# Patient Record
Sex: Male | Born: 1945 | Race: White | Hispanic: No | Marital: Married | Smoking: Never smoker
Health system: Southern US, Community
[De-identification: ages and names within clinical notes are randomized; demographics above are authoritative.]

## PROBLEM LIST (undated history)

## (undated) DIAGNOSIS — I2510 Atherosclerotic heart disease of native coronary artery without angina pectoris: Secondary | ICD-10-CM

## (undated) DIAGNOSIS — M1990 Unspecified osteoarthritis, unspecified site: Secondary | ICD-10-CM

## (undated) DIAGNOSIS — G4730 Sleep apnea, unspecified: Secondary | ICD-10-CM

## (undated) DIAGNOSIS — R011 Cardiac murmur, unspecified: Secondary | ICD-10-CM

## (undated) DIAGNOSIS — D329 Benign neoplasm of meninges, unspecified: Secondary | ICD-10-CM

## (undated) DIAGNOSIS — D6851 Activated protein C resistance: Secondary | ICD-10-CM

## (undated) DIAGNOSIS — N400 Enlarged prostate without lower urinary tract symptoms: Secondary | ICD-10-CM

## (undated) DIAGNOSIS — G629 Polyneuropathy, unspecified: Secondary | ICD-10-CM

## (undated) DIAGNOSIS — E119 Type 2 diabetes mellitus without complications: Secondary | ICD-10-CM

## (undated) DIAGNOSIS — I639 Cerebral infarction, unspecified: Secondary | ICD-10-CM

## (undated) DIAGNOSIS — I82409 Acute embolism and thrombosis of unspecified deep veins of unspecified lower extremity: Secondary | ICD-10-CM

## (undated) DIAGNOSIS — M1611 Unilateral primary osteoarthritis, right hip: Secondary | ICD-10-CM

## (undated) DIAGNOSIS — E785 Hyperlipidemia, unspecified: Secondary | ICD-10-CM

## (undated) DIAGNOSIS — I10 Essential (primary) hypertension: Secondary | ICD-10-CM

## (undated) DIAGNOSIS — R9431 Abnormal electrocardiogram [ECG] [EKG]: Secondary | ICD-10-CM

## (undated) DIAGNOSIS — Z7901 Long term (current) use of anticoagulants: Secondary | ICD-10-CM

## (undated) DIAGNOSIS — E669 Obesity, unspecified: Secondary | ICD-10-CM

## (undated) DIAGNOSIS — K760 Fatty (change of) liver, not elsewhere classified: Secondary | ICD-10-CM

## (undated) HISTORY — PX: TONSILLECTOMY: SUR1361

## (undated) HISTORY — DX: Acute embolism and thrombosis of unspecified deep veins of unspecified lower extremity: I82.409

## (undated) HISTORY — DX: Hyperlipidemia, unspecified: E78.5

## (undated) HISTORY — DX: Abnormal electrocardiogram (ECG) (EKG): R94.31

## (undated) HISTORY — DX: Unspecified osteoarthritis, unspecified site: M19.90

## (undated) HISTORY — DX: Activated protein C resistance: D68.51

## (undated) HISTORY — DX: Benign neoplasm of meninges, unspecified: D32.9

## (undated) HISTORY — DX: Long term (current) use of anticoagulants: Z79.01

## (undated) HISTORY — PX: CORONARY STENT PLACEMENT: SHX1402

## (undated) HISTORY — DX: Type 2 diabetes mellitus without complications: E11.9

## (undated) HISTORY — DX: Obesity, unspecified: E66.9

## (undated) HISTORY — DX: Fatty (change of) liver, not elsewhere classified: K76.0

---
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
## 2001-10-21 ENCOUNTER — Ambulatory Visit (HOSPITAL_COMMUNITY): Admission: RE | Admit: 2001-10-21 | Discharge: 2001-10-21 | Payer: Self-pay | Admitting: Family Medicine

## 2002-09-21 ENCOUNTER — Emergency Department (HOSPITAL_COMMUNITY): Admission: EM | Admit: 2002-09-21 | Discharge: 2002-09-21 | Payer: Self-pay | Admitting: Emergency Medicine

## 2006-09-14 ENCOUNTER — Emergency Department (HOSPITAL_COMMUNITY): Admission: EM | Admit: 2006-09-14 | Discharge: 2006-09-15 | Payer: Self-pay | Admitting: Emergency Medicine

## 2006-09-16 ENCOUNTER — Emergency Department (HOSPITAL_COMMUNITY): Admission: EM | Admit: 2006-09-16 | Discharge: 2006-09-16 | Payer: Self-pay | Admitting: Emergency Medicine

## 2006-09-28 ENCOUNTER — Encounter: Admission: RE | Admit: 2006-09-28 | Discharge: 2006-09-28 | Payer: Self-pay | Admitting: Pulmonary Disease

## 2006-10-02 DIAGNOSIS — G4730 Sleep apnea, unspecified: Secondary | ICD-10-CM

## 2006-10-02 HISTORY — DX: Sleep apnea, unspecified: G47.30

## 2006-11-23 ENCOUNTER — Ambulatory Visit (HOSPITAL_COMMUNITY): Admission: RE | Admit: 2006-11-23 | Discharge: 2006-11-23 | Payer: Self-pay | Admitting: Neurology

## 2007-07-19 ENCOUNTER — Ambulatory Visit (HOSPITAL_COMMUNITY): Admission: RE | Admit: 2007-07-19 | Discharge: 2007-07-19 | Payer: Self-pay | Admitting: Pulmonary Disease

## 2007-08-01 ENCOUNTER — Ambulatory Visit (HOSPITAL_COMMUNITY): Admission: RE | Admit: 2007-08-01 | Discharge: 2007-08-01 | Payer: Self-pay | Admitting: Pulmonary Disease

## 2007-08-01 ENCOUNTER — Ambulatory Visit: Payer: Self-pay | Admitting: Cardiology

## 2007-10-03 HISTORY — PX: DUPUYTREN / PALMAR FASCIOTOMY: SUR601

## 2007-10-28 ENCOUNTER — Ambulatory Visit (HOSPITAL_BASED_OUTPATIENT_CLINIC_OR_DEPARTMENT_OTHER): Admission: RE | Admit: 2007-10-28 | Discharge: 2007-10-28 | Payer: Self-pay | Admitting: Orthopedic Surgery

## 2007-10-28 ENCOUNTER — Encounter (INDEPENDENT_AMBULATORY_CARE_PROVIDER_SITE_OTHER): Payer: Self-pay | Admitting: Orthopedic Surgery

## 2008-05-02 HISTORY — PX: COLONOSCOPY W/ POLYPECTOMY: SHX1380

## 2008-05-07 ENCOUNTER — Ambulatory Visit: Payer: Self-pay | Admitting: Gastroenterology

## 2008-05-18 ENCOUNTER — Ambulatory Visit: Payer: Self-pay | Admitting: Gastroenterology

## 2008-05-18 ENCOUNTER — Ambulatory Visit (HOSPITAL_COMMUNITY): Admission: RE | Admit: 2008-05-18 | Discharge: 2008-05-18 | Payer: Self-pay | Admitting: Gastroenterology

## 2008-05-18 ENCOUNTER — Encounter: Payer: Self-pay | Admitting: Gastroenterology

## 2008-10-02 HISTORY — PX: VENTRAL HERNIA REPAIR: SHX424

## 2009-08-02 ENCOUNTER — Emergency Department (HOSPITAL_COMMUNITY): Admission: EM | Admit: 2009-08-02 | Discharge: 2009-08-02 | Payer: Self-pay | Admitting: Emergency Medicine

## 2009-08-04 ENCOUNTER — Ambulatory Visit (HOSPITAL_COMMUNITY): Admission: RE | Admit: 2009-08-04 | Discharge: 2009-08-04 | Payer: Self-pay | Admitting: General Surgery

## 2010-10-23 ENCOUNTER — Encounter: Payer: Self-pay | Admitting: Pulmonary Disease

## 2011-01-04 LAB — COMPREHENSIVE METABOLIC PANEL
Alkaline Phosphatase: 71 U/L (ref 39–117)
BUN: 9 mg/dL (ref 6–23)
CO2: 32 mEq/L (ref 19–32)
Calcium: 9.1 mg/dL (ref 8.4–10.5)
Creatinine, Ser: 0.7 mg/dL (ref 0.4–1.5)
GFR calc Af Amer: >60 mL/min (ref >60)
Glucose, Bld: 178 mg/dL — ABNORMAL HIGH (ref 70–99)
Potassium: 3.5 mEq/L (ref 3.5–5.1)
Total Bilirubin: 0.6 mg/dL (ref 0.3–1.2)

## 2011-01-04 LAB — CBC
MCHC: 34 g/dL (ref 30.0–36.0)
MCV: 87.3 fL (ref 78.0–100.0)
Platelets: 259 10*3/uL (ref 150–400)
RDW: 14.7 % (ref 11.5–15.5)
WBC: 13.4 10*3/uL — ABNORMAL HIGH (ref 4.0–10.5)

## 2011-01-04 LAB — URINALYSIS, ROUTINE W REFLEX MICROSCOPIC
Protein, ur: NEGATIVE mg/dL
pH: 6 (ref 5.0–8.0)

## 2011-01-04 LAB — DIFFERENTIAL
Lymphocytes Relative: 8 % — ABNORMAL LOW (ref 12–46)
Lymphs Abs: 1.1 10*3/uL (ref 0.7–4.0)
Monocytes Absolute: 0.6 10*3/uL (ref 0.1–1.0)
Monocytes Relative: 5 % (ref 3–12)
Neutro Abs: 11.6 10*3/uL — ABNORMAL HIGH (ref 1.7–7.7)

## 2011-02-14 NOTE — Op Note (Signed)
NAMECACE, OSORTO               ACCOUNT NO.:  [id number]   MEDICAL RECORD NO.:  [id number]          PATIENT TYPE:  AMB   LOCATION:  DSC                          FACILITY:  [facility]   PHYSICIAN:  Madelynn Done, MD  DATE OF BIRTH:  [birth year]   DATE OF PROCEDURE:  10/28/2007  DATE OF DISCHARGE:                               OPERATIVE REPORT   PREOPERATIVE DIAGNOSIS:  Right hand Dupuytren's contracture, ring and  small finger, digit and palm.   POSTOPERATIVE DIAGNOSIS:  Right hand Dupuytren's contracture, ring and  small finger, digit and palm.   ATTENDING SURGEON:  Dr. Bradly Bienenstock, who was scrubbed and present for  the entire procedure.   ASSISTANT SURGEON:  None.   PROCEDURE:  1. Right ring finger digital and palmar fasciectomy with advancement      flap closure.  2. Right small finger palmer and digital fasciectomy with contracture      involving the proximal interphalangeal joint with Y-V advancement      flap closure.   ANESTHESIA:  Infraclavicular block with performed by Anesthesia as well  as general via LMA.   TOURNIQUET TIME:  60 minutes at 200 mmHg.   SPECIMENS:  Dupuytren's contracture and tissue to pathology of both the  ring and small finger.   SURGICAL INDICATIONS:  Mr. Rowland is a 65 year old right-hand-dominant  gentleman who presented to the office with a PIP contracture to the  right small finger as well as MP contracture to the small finger as well  as a large pre tenderness and retrovascular cord.  He also had a MP  contracture of the right ring finger.  After counseling was administered  in the office, the patient elected to undergo the above procedure.  Risks, benefits and alternatives discussed in detail.  Risks include but  not limited to bleeding, infection, hematoma, neurovascular bundle  injury, stiffness in the fingers, recurrence of the disease, loss of  motion of the digits and need for further surgical intervention and  dystrophy of the  hand.  A signed informed consent was obtained on the  day of surgery.   DESCRIPTION OF PROCEDURE:  The patient was properly identified in the  preoperative holding area.  The patient underwent infraclavicular block  without any difficulties.  The patient received preoperative antibiotics  prior to any skin incision.  The patient brought back to the operating  room, placed supine on anesthesia table.  General anesthesia was  administered via LMA.  The patient tolerated this procedure well.  A  well-padded tourniquet was then placed on the right brachium and sealed  with a 1000 drape.  The right upper extremity were prepped with  Hibiclens and sterilely draped.  Time-out was called, correct side was  identified and procedure was then begun.  Attention was then turned to  the right ring finger where short V-Y skin incisions were then made over  the ring finger, extending into the digit and extending into the palm.  After Esmarch exsanguination, dissection was carried down through the  skin and subcutaneous tissues.  The neurovascular bundles were  identified  proximally both radially and ulnarly.  The diseased fascia  was then excised sharply all the way down to the cord extending into  behind the flexor tendon sheath.  After excision of the disease tissue  the wounds were then thoroughly irrigated.  The skin flaps were then  loosely reapproximated with a 5-0 nylon suture.  Attention was then  turned the ring finger where using similar skin incisions Y-V skin  incisions, dissection was carried down through the skin of the digit and  into the palm through the skin and subcutaneous tissues.  Careful skin  flaps were then raised.  The dissection was then done proximally where  the diseased tissue was identified and then after proper identification  of the neurovascular bundles, the disease tissue was then transected  transversely.  This allowed the digit to be brought into better more   extension.  Following this a traction was then applied to the diseased  tissue.  The cord to the abductor digiti minimi as well as a  retrovascular cord were then resected.  The neurovascular bundle had  been displaced volarly through the spiral cord and the spiral cord was  then protected ulnarly and the diseased tissue was then resected after  identification and protection of the neurovascular bundles.  Once the  diseased tissue was then removed, the PIP joint was able to be placed  into full extension.  The wound was then thoroughly irrigated.  The  tourniquet deflated.  Hemostasis obtained with bipolar cautery.  The  skin flaps were then advanced in a Y-V fashion.  The skin was then  closed with the 5-0 nylon suture and 4-0 nylon sutures.  Small vessel  loop was applied a drain for the small finger.  Adaptic dressing,  sterile compressive dressing was then applied to the digit.  The patient  was then taken to the recovery room in good condition.  Following  deflation of tourniquet there was good perfusion of the fingers.   POSTOPERATIVE PLAN:  The patient be seen back in the office in one day  for wound check and drain removal.  He will be then set up for therapy  for a hand splint keeping his digits in full extension and proceed with  a postoperative Dupuytren's protocol.      Madelynn Done, MD  Electronically Signed     FWO/MEDQ  D:  10/28/2007  T:  10/28/2007  Job:  [id number]

## 2011-02-14 NOTE — Consult Note (Signed)
NAME:  Omar [name], Omar [name]               ACCOUNT NO.:  [id number]   MEDICAL RECORD NO.:  [id number]          PATIENT TYPE:  AMB   LOCATION:  DAY                           FACILITY:  [facility]   PHYSICIAN:  Kassie Mends, M.D.      DATE OF BIRTH:  Sep 05, 1946   DATE OF CONSULTATION:  DATE OF DISCHARGE:                                 CONSULTATION   REFERRING PHYSICIAN:  Dr.Ed Juanetta Gosling.   REASON FOR CONSULTATION:  Colonoscopy.   HISTORY OF PRESENT ILLNESS:  [name] is a 65 year old male who has  significant past medical history of congestive heart failure and  diabetes.  He is recently had an adjustment in his diuretic regimen due  to retained fluid.  He came to discuss of the benefits versus the risks  of colonoscopy.  He denies any rectal bleeding, black tarry stools,  change in bowel habits, heartburn, indigestion weight loss, abdominal  pain, problems of swallowing, or problems with sedation.  He has had no  nausea, vomiting, diarrhea, or constipation.  His appetite has been  good.  He has no problems of swallowing.   PAST MEDICAL HISTORY:  1. Dupuytren contracture.  2. Diabetes.  3. Benign prostatic hypertrophy.  4. Congestive heart failure.   He has never had any history of diverticulitis.   PAST SURGICAL HISTORY:  Right hand surgery.   ALLERGIES:  No known drug allergies.   MEDICATIONS:  1. Actos 30 mg daily.  2. Simvastatin 40 mg daily.  3. Flomax 0.4 mg daily.  4. Carvedilol 6.25 mg daily.  5. Amlodipine 5 mg daily.  6. Lisinopril and hydrochlorothiazide 20/25 daily.  7. Sertraline 50 mg daily.  8. Glyburide and metformin 5/500 two b.i.d.  9. Torsemide 100 mg half daily.   FAMILY HISTORY:  He has no family history of colon cancer or colon  polyps.   SOCIAL HISTORY:  He is married and he works for [employer].  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  He denies any tobacco or alcohol use.   PHYSICAL EXAM:  VITAL SIGNS:  Weight 290 pounds (down 9 pounds since  July  2009), height 5 feet 8 inches, BMI 44.1 (morbidly obese),  temperature 98, blood pressure 130/80, and pulse 64.  GENERAL:  He is in no apparent distress.  Alert and oriented x4.  HEENT:  Atraumatic and normocephalic.  Pupils equal, round, and reactive  to light.  Mouth no oral lesions.  Posterior pharynx without erythema or  exudate.  NECK:  Full range of motion.  No lymphadenopathy.  LUNGS:  Clear to auscultation bilaterally.  CARDIOVASCULAR:  Regular rhythm.  No murmur, normal S1 and S2.  ABDOMEN:  Bowel sounds are present, soft, nontender, and nondistended.  No rebound or guarding.  EXTREMITIES:  Trace edema in the left lower extremity, 1+ edema in the  right lower extremity.  NEUROLOGIC:  He has no focal neurologic deficits.   ASSESSMENT:  [name] is a 65 year old male who is average risk for  developing colon cancer.  His bowel prep needs to be adjusted for his  diabetes.  Thank you for allowing me to see Mr. Cuthrell in consultation.  My recommendations follow.   RECOMMENDATIONS:  1. He is to hold his Actos on the day before and the day of this      procedure.  He should hold his glyburide/metformin on the day of      his procedure.  2. He will receive the TriLyte bowel prep.  I did give him      instructions on how to take the bowel prep.  I did discuss with him      that it may cause nausea, vomiting, abdominal cramping, and      bloating.  He is asked to hold the prep for 1 hour if he develops      those symptoms.  3. I did discuss the medications that we use and that he will be      sedated and most likely not remember anything about the procedure.      The procedure takes about 30-45 minutes.  4. He may follow up with me as needed.      Kassie Mends, M.D.  Electronically Signed     SM/MEDQ  D:  05/07/2008  T:  05/07/2008  Job:  [id number]   cc:   Ramon Dredge L. Juanetta Gosling, M.D.  Fax: [phone]

## 2011-02-14 NOTE — Procedures (Signed)
[name], [name]               ACCOUNT NO.:  [id number]   MEDICAL RECORD NO.:  [id number]          PATIENT TYPE:  OUT   LOCATION:  RAD                           FACILITY:  [facility]   PHYSICIAN:  Gerrit Friends. Dietrich Pates, MD, FACCDATE OF BIRTH:  [birth year]   DATE OF PROCEDURE:  DATE OF DISCHARGE:                                ECHOCARDIOGRAM   REFERRING PHYSICIAN:  Edward L. Juanetta Gosling, M.D.   CLINICAL DATA:  A 65 year old gentleman with hypertension, diabetes and  cardiomegaly.  Aorta 3.3, left atrium 4.5, septum 1.5, posterior wall  1.4, LV diastole 4.2, LV systole 3.3.  1. Technically difficult and somewhat limited echocardiographic study.  2. Left atrial size at the upper limit of normal; normal right atrium.  3. Normal right ventricular size and function; borderline RVH.  4. Normal aortic valve; normal diameter of the proximal ascending      aorta with mild calcification of the wall; somewhat increased      velocity across the LVOT/aortic valve.  5. Normal tricuspid valve; normal proximal pulmonary artery; pulmonic      valve not well imaged, but is grossly normal.  6. Normal mitral valve; mild annular calcification.  7. Normal left ventricular size; mild hypertrophy; normal regional and      global function.      Gerrit Friends. Dietrich Pates, MD, [facility]  Electronically Signed     RMR/MEDQ  D:  08/01/2007  T:  08/02/2007  Job:  [id number]

## 2011-02-14 NOTE — Op Note (Signed)
NAMEKAYNEN, Omar [name]               ACCOUNT NO.:  [id number]   MEDICAL RECORD NO.:  [id number]          PATIENT TYPE:  AMB   LOCATION:  DAY                           FACILITY:  [facility]   PHYSICIAN:  Kassie Mends, M.D.      DATE OF BIRTH:  December 13, 1945   DATE OF PROCEDURE:  05/18/2008  DATE OF DISCHARGE:                               OPERATIVE REPORT   REFERRING PHYSICIAN:  Oneal Deputy. Juanetta Gosling, MD   PROCEDURES:  Colonoscopy with cold snare and snare cautery polypectomy.   INDICATION FOR EXAM:  Omar [name] is a 65 year old male who presents for  average risk colon cancer screening.   FINDINGS:  1. Two 6-mm sessile ascending colon polyps removed.  One was removed      via cold snare and cautery was applied to the base of the polyp.      The other was removed via snare cautery.  2. A 6-mm sessile transverse colon polyp removed via snare cautery.  3. An 8-mm sessile sigmoid colon polyp removed via snare cautery.      Otherwise, no masses, inflammatory changes, diverticula, or AVMs      seen.  4. Moderate internal hemorrhoids.  Otherwise, normal retroflexed view      of the rectum.   DIAGNOSES:  1. Multiple colon polyps.  2. Moderate internal hemorrhoids.   RECOMMENDATIONS:  1. If his polyps are simple adenomas, recommend screening colonoscopy      in 10 years.  2. Will call Omar [name] with results of his biopsies.  3. No aspirin, nonsteroidal antiinflammatory drugs, or anticoagulation      for 7 days.  4. He should follow a high-fiber diet.  He is given a handout on high-      fiber diet, polyps, and hemorrhoids.   MEDICATIONS:  1. Demerol 75 mg IV.  2. Versed 4 mg IV.   PROCEDURE TECHNIQUE:  Physical exam was performed.  Informed consent was  obtained from the patient after explaining the benefits, risks, and  alternatives the patient the procedure. The patient was connected to the  monitor and placed in left lateral position. Continuous oxygen was  provided by nasal cannula and IV  medicine administered through an  indwelling cannula.  After administration of sedation and rectal exam,  the patient's rectum was intubated and the scope was advanced under  direct visualization to the cecum.  The scope was removed slowly by  carefully  examining the color, texture, anatomy, and integrity of the mucosa on  the way out.  The patient was recovered in endoscopy and discharged home  in satisfactory condition.   PATH:  Simple adenomas. TCS in 10 years. High fiber diet.      Kassie Mends, M.D.  Electronically Signed     SM/MEDQ  D:  05/18/2008  T:  05/19/2008  Job:  [id number]   cc:   Omar [name], M.D.  Fax: [phone]

## 2011-06-22 LAB — BASIC METABOLIC PANEL
Calcium: 9.6
GFR calc Af Amer: >60
GFR calc non Af Amer: >60
Potassium: 3.8
Sodium: 141

## 2011-06-22 LAB — POCT HEMOGLOBIN-HEMACUE: Hemoglobin: 14

## 2012-02-19 ENCOUNTER — Encounter (HOSPITAL_BASED_OUTPATIENT_CLINIC_OR_DEPARTMENT_OTHER): Payer: Self-pay | Admitting: *Deleted

## 2012-02-19 NOTE — Progress Notes (Signed)
Will need ekg and bmet Here 09 for same surg-does not use his cpap-di not have to say

## 2012-02-21 ENCOUNTER — Encounter (HOSPITAL_BASED_OUTPATIENT_CLINIC_OR_DEPARTMENT_OTHER)
Admission: RE | Disposition: A | Discharge status: Home / Self Care | Payer: Self-pay | Source: Ambulatory Visit | Attending: Orthopedic Surgery

## 2012-02-21 ENCOUNTER — Ambulatory Visit (HOSPITAL_BASED_OUTPATIENT_CLINIC_OR_DEPARTMENT_OTHER)
Admission: RE | Admit: 2012-02-21 | Discharge: 2012-02-22 | Disposition: A | Discharge status: Home / Self Care | Payer: Medicare Other | Source: Ambulatory Visit | Attending: Orthopedic Surgery | Admitting: Orthopedic Surgery

## 2012-02-21 ENCOUNTER — Encounter (HOSPITAL_BASED_OUTPATIENT_CLINIC_OR_DEPARTMENT_OTHER): Payer: Self-pay | Admitting: *Deleted

## 2012-02-21 ENCOUNTER — Other Ambulatory Visit: Payer: Self-pay

## 2012-02-21 ENCOUNTER — Encounter (HOSPITAL_BASED_OUTPATIENT_CLINIC_OR_DEPARTMENT_OTHER): Payer: Self-pay | Admitting: Anesthesiology

## 2012-02-21 DIAGNOSIS — M720 Palmar fascial fibromatosis [Dupuytren]: Secondary | ICD-10-CM | POA: Insufficient documentation

## 2012-02-21 DIAGNOSIS — Z538 Procedure and treatment not carried out for other reasons: Secondary | ICD-10-CM | POA: Insufficient documentation

## 2012-02-21 DIAGNOSIS — Z01810 Encounter for preprocedural cardiovascular examination: Secondary | ICD-10-CM | POA: Insufficient documentation

## 2012-02-21 DIAGNOSIS — Z01812 Encounter for preprocedural laboratory examination: Secondary | ICD-10-CM | POA: Insufficient documentation

## 2012-02-21 HISTORY — DX: Cerebral infarction, unspecified: I63.9

## 2012-02-21 HISTORY — DX: Sleep apnea, unspecified: G47.30

## 2012-02-21 HISTORY — DX: Essential (primary) hypertension: I10

## 2012-02-21 HISTORY — DX: Benign prostatic hyperplasia without lower urinary tract symptoms: N40.0

## 2012-02-21 LAB — POCT I-STAT, CHEM 8
BUN: 18 mg/dL (ref 6–23)
Calcium, Ion: 1.02 mmol/L — ABNORMAL LOW (ref 1.12–1.32)
Creatinine, Ser: 0.7 mg/dL (ref 0.50–1.35)
TCO2: 29 mmol/L (ref 0–100)

## 2012-02-21 SURGERY — RELEASE, DUPUYTREN CONTRACTURE
Anesthesia: Choice | Laterality: Right

## 2012-02-21 MED ORDER — GLYCOPYRROLATE 0.2 MG/ML IJ SOLN
0.2000 mg | Freq: Once | INTRAMUSCULAR | Status: AC | PRN
  Administered 2012-02-21: 0.2 mg via INTRAVENOUS

## 2012-02-21 MED ORDER — LACTATED RINGERS IV SOLN
INTRAVENOUS | Status: DC
  Administered 2012-02-21: 11:00:00 via INTRAVENOUS

## 2012-02-21 MED ORDER — CHLORHEXIDINE GLUCONATE 4 % EX LIQD: 60.0000 mL | Freq: Once | CUTANEOUS | Status: DC

## 2012-02-21 MED ORDER — CEFAZOLIN SODIUM-DEXTROSE 2-3 GM-% IV SOLR: 2.0000 g | INTRAVENOUS | Status: DC

## 2012-02-21 SURGICAL SUPPLY — 43 items
BANDAGE ELASTIC 3 VELCRO ST LF (GAUZE/BANDAGES/DRESSINGS) ×1 IMPLANT
BANDAGE ELASTIC 4 VELCRO ST LF (GAUZE/BANDAGES/DRESSINGS) IMPLANT
BANDAGE GAUZE ELAST BULKY 4 IN (GAUZE/BANDAGES/DRESSINGS) ×1 IMPLANT
BLADE SURG 15 STRL LF DISP TIS (BLADE) ×2 IMPLANT
BLADE SURG 15 STRL SS (BLADE)
BNDG CMPR 9X4 STRL LF SNTH (GAUZE/BANDAGES/DRESSINGS)
BNDG COHESIVE 3X5 TAN STRL LF (GAUZE/BANDAGES/DRESSINGS) IMPLANT
BNDG ESMARK 4X9 LF (GAUZE/BANDAGES/DRESSINGS) ×1 IMPLANT
CORDS BIPOLAR (ELECTRODE) IMPLANT
COVER MAYO STAND STRL (DRAPES) ×1 IMPLANT
COVER TABLE BACK 60X90 (DRAPES) ×1 IMPLANT
CUFF TOURNIQUET SINGLE 18IN (TOURNIQUET CUFF) IMPLANT
DRAIN PENROSE 1/4X12 LTX STRL (WOUND CARE) IMPLANT
DRAPE EXTREMITY T 121X128X90 (DRAPE) ×1 IMPLANT
DRAPE SURG 17X23 STRL (DRAPES) ×1 IMPLANT
DRSG EMULSION OIL 3X3 NADH (GAUZE/BANDAGES/DRESSINGS) ×1 IMPLANT
GAUZE SPONGE 4X4 16PLY XRAY LF (GAUZE/BANDAGES/DRESSINGS) IMPLANT
GLOVE BIO SURGEON STRL SZ8 (GLOVE) ×1 IMPLANT
GOWN PREVENTION PLUS XLARGE (GOWN DISPOSABLE) ×1 IMPLANT
GOWN PREVENTION PLUS XXLARGE (GOWN DISPOSABLE) ×1 IMPLANT
LOOP VESSEL MAXI BLUE (MISCELLANEOUS) IMPLANT
NDL HYPO 25X1 1.5 SAFETY (NEEDLE) IMPLANT
NEEDLE HYPO 25X1 1.5 SAFETY (NEEDLE) IMPLANT
NS IRRIG 1000ML POUR BTL (IV SOLUTION) ×1 IMPLANT
PACK BASIN DAY SURGERY FS (CUSTOM PROCEDURE TRAY) ×1 IMPLANT
PAD CAST 3X4 CTTN HI CHSV (CAST SUPPLIES) ×1 IMPLANT
PADDING CAST ABS 3INX4YD NS (CAST SUPPLIES)
PADDING CAST ABS 4INX4YD NS (CAST SUPPLIES)
PADDING CAST ABS COTTON 3X4 (CAST SUPPLIES) IMPLANT
PADDING CAST ABS COTTON 4X4 ST (CAST SUPPLIES) ×1 IMPLANT
PADDING CAST COTTON 3X4 STRL (CAST SUPPLIES)
SPLINT PLASTER CAST XFAST 3X15 (CAST SUPPLIES) IMPLANT
SPLINT PLASTER XTRA FASTSET 3X (CAST SUPPLIES)
SPONGE GAUZE 4X4 12PLY (GAUZE/BANDAGES/DRESSINGS) ×1 IMPLANT
STOCKINETTE 4X48 STRL (DRAPES) ×1 IMPLANT
SUT ETHILON 4 0 P 3 18 (SUTURE) ×1 IMPLANT
SUT ETHILON 5 0 P 3 18 (SUTURE)
SUT NYLON ETHILON 5-0 P-3 1X18 (SUTURE) ×1 IMPLANT
SYR BULB 3OZ (MISCELLANEOUS) ×1 IMPLANT
SYR CONTROL 10ML LL (SYRINGE) IMPLANT
TOWEL OR 17X24 6PK STRL BLUE (TOWEL DISPOSABLE) ×1 IMPLANT
UNDERPAD 30X30 INCONTINENT (UNDERPADS AND DIAPERS) ×1 IMPLANT
WATER STERILE IRR 1000ML POUR (IV SOLUTION) ×1 IMPLANT

## 2012-02-21 NOTE — Progress Notes (Signed)
Pt. Vagal during IV start.  Pt. Diaphoretic, BP 93/55, HR 38.  0.2 MG of glycopyrrolate given IV @ 1113, HOB lowered @1117 :  HR 51,   @ 1120  HR  110/68 HR 60.  @ 1137 HR 60, BP  128 / 76 Pt. Remained talkative throughout.

## 2012-02-21 NOTE — Progress Notes (Signed)
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  He had an ECG in 2010 that did not show this. With his large number of risk factors and no knowledge of any CAD I felt he needed further cardiac work up before elective surgery is considered.  His wife is aware and will contact Dr. Juanetta Gosling in [place] to arrange further care. Dr. Melvyn Novas will be made aware.

## 2012-02-22 ENCOUNTER — Encounter (HOSPITAL_BASED_OUTPATIENT_CLINIC_OR_DEPARTMENT_OTHER): Payer: Self-pay | Admitting: Orthopedic Surgery

## 2012-02-23 ENCOUNTER — Ambulatory Visit (HOSPITAL_COMMUNITY)
Admission: RE | Admit: 2012-02-23 | Discharge: 2012-02-23 | Disposition: A | Discharge status: Home / Self Care | Payer: Medicare Other | Source: Ambulatory Visit | Attending: Pulmonary Disease | Admitting: Pulmonary Disease

## 2012-02-23 DIAGNOSIS — I319 Disease of pericardium, unspecified: Secondary | ICD-10-CM

## 2012-02-23 DIAGNOSIS — E119 Type 2 diabetes mellitus without complications: Secondary | ICD-10-CM | POA: Insufficient documentation

## 2012-02-23 DIAGNOSIS — I10 Essential (primary) hypertension: Secondary | ICD-10-CM | POA: Insufficient documentation

## 2012-02-23 DIAGNOSIS — R9431 Abnormal electrocardiogram [ECG] [EKG]: Secondary | ICD-10-CM | POA: Insufficient documentation

## 2012-02-23 DIAGNOSIS — I517 Cardiomegaly: Secondary | ICD-10-CM | POA: Insufficient documentation

## 2012-02-23 DIAGNOSIS — I519 Heart disease, unspecified: Secondary | ICD-10-CM | POA: Insufficient documentation

## 2012-02-23 NOTE — Progress Notes (Signed)
*  PRELIMINARY RESULTS* Echocardiogram 2D Echocardiogram has been performed.  Caswell Corwin 02/23/2012, 10:39 AM

## 2012-03-13 ENCOUNTER — Encounter: Payer: Self-pay | Admitting: Cardiology

## 2012-03-14 ENCOUNTER — Encounter: Payer: Self-pay | Admitting: Cardiology

## 2012-03-14 ENCOUNTER — Ambulatory Visit (INDEPENDENT_AMBULATORY_CARE_PROVIDER_SITE_OTHER): Payer: Medicare Other | Admitting: Cardiology

## 2012-03-14 VITALS — BP 178/92 | HR 90 | Ht 68.0 in | Wt 293.0 lb

## 2012-03-14 DIAGNOSIS — E1159 Type 2 diabetes mellitus with other circulatory complications: Secondary | ICD-10-CM | POA: Insufficient documentation

## 2012-03-14 DIAGNOSIS — R9431 Abnormal electrocardiogram [ECG] [EKG]: Secondary | ICD-10-CM | POA: Insufficient documentation

## 2012-03-14 DIAGNOSIS — E782 Mixed hyperlipidemia: Secondary | ICD-10-CM | POA: Insufficient documentation

## 2012-03-14 DIAGNOSIS — E785 Hyperlipidemia, unspecified: Secondary | ICD-10-CM

## 2012-03-14 DIAGNOSIS — I10 Essential (primary) hypertension: Secondary | ICD-10-CM

## 2012-03-14 DIAGNOSIS — IMO0001 Reserved for inherently not codable concepts without codable children: Secondary | ICD-10-CM | POA: Insufficient documentation

## 2012-03-14 DIAGNOSIS — K760 Fatty (change of) liver, not elsewhere classified: Secondary | ICD-10-CM | POA: Insufficient documentation

## 2012-03-14 DIAGNOSIS — D329 Benign neoplasm of meninges, unspecified: Secondary | ICD-10-CM | POA: Insufficient documentation

## 2012-03-14 DIAGNOSIS — G4730 Sleep apnea, unspecified: Secondary | ICD-10-CM | POA: Insufficient documentation

## 2012-03-14 DIAGNOSIS — I219 Acute myocardial infarction, unspecified: Secondary | ICD-10-CM

## 2012-03-14 MED ORDER — VERAPAMIL HCL ER 240 MG PO TBCR: 240.0000 mg | EXTENDED_RELEASE_TABLET | Freq: Every day | ORAL | Status: DC

## 2012-03-14 NOTE — Progress Notes (Deleted)
Name: Omar [name]    DOB: Sep 18, 1946  Age: 66 y.o.  MR#: [id number]       PCP:  Fredirick Maudlin, MD      Insurance: @PAYORNAME @   CC:    Chief Complaint  Patient presents with  . Abn ekg, edema and Htn per Dr Juanetta Gosling    VS BP 178/92  Pulse 90  Ht 5\' 8"  (1.727 m)  Wt 293 lb (132.904 kg)  BMI 44.55 kg/m2  SpO2 95%  Weights Current Weight  03/14/12 293 lb (132.904 kg)  02/19/12 300 lb (136.079 kg)  02/19/12 300 lb (136.079 kg)    Blood Pressure  BP Readings from Last 3 Encounters:  03/14/12 178/92  02/21/12 122/74  02/21/12 122/74     Admit date:  (Not on file) Last encounter with RMR:  03/13/2012   Allergy No Known Allergies  Current Outpatient Prescriptions  Medication Sig Dispense Refill  . amLODipine (NORVASC) 5 MG tablet Take 5 mg by mouth daily.      Marland Kitchen aspirin 81 MG tablet Take 81 mg by mouth daily.      Marland Kitchen atomoxetine (STRATTERA) 40 MG capsule Take 40 mg by mouth 2 (two) times daily.      . carvedilol (COREG) 6.25 MG tablet Take 6.25 mg by mouth 2 (two) times daily with a meal.      . glyBURIDE-metformin (GLUCOVANCE) 5-500 MG per tablet Take 1 tablet by mouth 2 (two) times daily with a meal. Takes 2 at a time      . lisinopril-hydrochlorothiazide (PRINZIDE,ZESTORETIC) 20-25 MG per tablet Take 1 tablet by mouth daily.      . potassium chloride SA (K-DUR,KLOR-CON) 20 MEQ tablet Take 20 mEq by mouth daily.      . pravastatin (PRAVACHOL) 40 MG tablet Take 40 mg by mouth daily.      . saxagliptin HCl (ONGLYZA) 2.5 MG TABS tablet Take 5 mg by mouth daily.      . Tamsulosin HCl (FLOMAX) 0.4 MG CAPS Take 0.4 mg by mouth daily after breakfast.      . torsemide (DEMADEX) 20 MG tablet         Discontinued Meds:   There are no discontinued medications.  Patient Active Problem List  Diagnosis  . Hypertension  . Diabetes mellitus, type 2  . Sleep apnea  . Meningioma  . Hyperlipidemia  . Obesity  . Hepatic steatosis    LABS Admission on 02/21/2012, Discharged on  02/22/2012  Component Date Value  . Sodium 02/21/2012 139   . Potassium 02/21/2012 4.9   . Chloride 02/21/2012 104   . BUN 02/21/2012 18   . Creatinine, Ser 02/21/2012 0.70   . Glucose, Bld 02/21/2012 91   . Calcium, Ion 02/21/2012 1.02*  . TCO2 02/21/2012 29   . Hemoglobin 02/21/2012 15.3   . HCT 02/21/2012 45.0      Results for this Opt Visit:     Results for orders placed during the hospital encounter of 02/21/12  POCT I-STAT, CHEM 8      Component Value Range   Sodium 139  135 - 145 mEq/L   Potassium 4.9  3.5 - 5.1 mEq/L   Chloride 104  96 - 112 mEq/L   BUN 18  6 - 23 mg/dL   Creatinine, Ser 4.09  0.50 - 1.35 mg/dL   Glucose, Bld 91  70 - 99 mg/dL   Calcium, Ion 8.11 (*) 1.12 - 1.32 mmol/L   TCO2 29  0 - 100  mmol/L   Hemoglobin 15.3  13.0 - 17.0 g/dL   HCT 16.1  09.6 - 04.5 %    EKG Orders placed during the hospital encounter of 02/21/12  . EKG 12-LEAD  . EKG 12-LEAD     Prior Assessment and Plan Problem List as of 03/14/2012            Cardiology Problems   Hypertension   Hyperlipidemia     Other   Diabetes mellitus, type 2   Sleep apnea   Meningioma   Obesity   Hepatic steatosis       Imaging: No results found.   FRS Calculation: Score not calculated. Missing: Total Cholesterol, HDL

## 2012-03-14 NOTE — Assessment & Plan Note (Signed)
No recent lipid profile located; one will be drawn.

## 2012-03-14 NOTE — Assessment & Plan Note (Signed)
Blood pressure is elevated at this visit, but compliance with antihypertensive medication has been less than perfect.  Amlodipine is likely contributing to peripheral edema and will be discontinued.  Verapamil will be substituted.  BP will be monitored with patient obtaining multiple values at home.

## 2012-03-14 NOTE — Assessment & Plan Note (Addendum)
Despite fairly convincing findings on EKG for previous inferior MI, patient has no history suggesting such an event, and normal left ventricular systolic function on an echocardiogram suggests that the EKG represents a false positive.  Patient's wife is insistent that coronary artery disease be excluded to the extent possible and even requested cardiac catheterization.  I explained that an invasive study is not appropriate for a questionable diagnosis of myocardial infarction, perhaps remote, in an asymptomatic patient.  I explained other options including stress testing, cardiac CT and cardiac MRI.  We agreed that cardiac CT would likely provide the most information.  Peripheral edema in the absence of any evidence to suggest congestive heart failure.  Renal function is normal.  I suspect that this is due to peripheral venous disease, but that amlodipine may be contributing.  Patient claims a low-salt diet, but it is not entirely clear that is the case.  Additional teaching provided.  The benefits of leg elevation were also discussed.  Diuretics will be held until we determine whether discontinuation of amlodipine will provide significant benefit.

## 2012-03-14 NOTE — Patient Instructions (Addendum)
Your physician recommends that you schedule a follow-up appointment in: 2 - 3 weeks   Your physician has requested that you have cardiac CT. Cardiac computed tomography (CT) is a painless test that uses an x-ray machine to take clear, detailed pictures of your heart. For further information please visit [email or web address] Please follow instruction sheet as given.  Low Salt diet  Elevate legs as much as possible  Your physician has recommended you make the following change in your medication:  1 - STOP Amlodipine 2 - START Verapamil 240 mg daily 3 -   Your physician has requested that you regularly monitor and record your blood pressure readings at home. Please use the same machine at the same time of day to check your readings and record them to bring to your follow-up visit.  Your physician recommends that you return for lab work in: [employer]

## 2012-03-14 NOTE — Progress Notes (Signed)
Patient ID: Omar [name], male   DOB: 1946/02/14, 66 y.o.   MRN: [id number]  HPI: Initial Cardiology evaluation performed at the kind request of Dr. Juanetta [name] for assessment of EKG abnormalities, peripheral edema, hypertension and sleep apnea.  This nice gentleman is married to the lovely woman who greets patients and visitors at the hospital entrance.  He recently was scheduled for surgical repair of a trigger finger, but preoperative EKG was abnormal prompting cancellation of the procedure.  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  Omar [name] has never experienced any significant chest discomfort.  He denies dyspnea on exertion.  He recently has had increasing pedal edema.  His wife notes increasing somnolence, poor sleep efficiency and irritability.  He was diagnosed with sleep apnea 5 years ago.  Treatment with a positive pressure mask was advised but never initiated.  Omar [name] has no history of cardiac disease, but did undergo stress testing some years ago performed by [facility], apparently with negative results.  Current Outpatient Prescriptions on File Prior to Visit  Medication Sig Dispense Refill  . aspirin 81 MG tablet Take 81 mg by mouth daily.      Marland Kitchen atomoxetine (STRATTERA) 40 MG capsule Take 40 mg by mouth 2 (two) times daily.      . carvedilol (COREG) 6.25 MG tablet Take 6.25 mg by mouth 2 (two) times daily with a meal.      . glyBURIDE-metformin (GLUCOVANCE) 5-500 MG per tablet Take 1 tablet by mouth 2 (two) times daily with a meal. Takes 2 at a time      . lisinopril-hydrochlorothiazide (PRINZIDE,ZESTORETIC) 20-25 MG per tablet Take 1 tablet by mouth daily.      . potassium chloride SA (K-DUR,KLOR-CON) 20 MEQ tablet Take 20 mEq by mouth daily.      . pravastatin (PRAVACHOL) 40 MG tablet Take 40 mg by mouth daily.        . saxagliptin HCl (ONGLYZA) 2.5 MG TABS tablet Take 5 mg by mouth daily.      . Tamsulosin HCl (FLOMAX) 0.4 MG CAPS Take 0.4 mg by mouth daily after breakfast.      . verapamil (CALAN-SR) 240 MG CR tablet Take 1 tablet (240 mg total) by mouth at bedtime.  30 tablet  12   No Known Allergies    Past Medical History  Diagnosis Date  . 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 Diabetes mellitus, type 2     No insulin  . Sleep apnea 2008    Dr. Darrol Angel recommended, but refused by patient  . BPH (benign prostatic hyperplasia)   . Arthritis   . Meningioma     Left frontal; CVA identified by MRI; no neurologic symptoms  . Hyperlipidemia   . Obesity   . Hepatic steatosis   . Abnormal EKG     Inferior Q waves    Past Surgical History  Procedure Date  . Tonsillectomy   . Ventral hernia repair 2010    umb hernia-[facility]-went home same day  . Colonoscopy w/ polypectomy 05/2008    polypectomy x4-adenomatous; internal hemorrhoids  . Dupuytren / palmar fasciotomy 2009    rt hand-dsc-went home same day  . Dupuytren contracture release 02/21/2012    Procedure: DUPUYTREN CONTRACTURE RELEASE;  Surgeon: Sharma Covert,  MD;  Location: [facility];  Service: Orthopedics;  Laterality: Right;  partial palmar and digital fasciectomy and joint release     Family History  Problem Relation Age of Onset  . Cancer Mother   . Cancer Father      History   Social History  . Marital Status: Married    Spouse Name: N/A    Number of Children: N/A  . Years of Education: N/A   Occupational History  . Optician, dispensing   Social History Main Topics  . Smoking status: Never Smoker   . Smokeless tobacco: Never Used  . Alcohol Use: No  . Drug Use: No  . Sexually Active: Not on file   Other Topics Concern  . Not on file   Social History Narrative  . No narrative on file    ROS:   Colonoscopy performed 05/2008; denies chest discomfort, dyspnea, orthopnea, PND, palpitations, lightheadedness or syncope.  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  He has noted some easy bruising.  Denies constipation.  All other systems reviewed and are negative.  PHYSICAL EXAM: BP 178/92  Pulse 90  Ht 5\' 8"  (1.727 m)  Wt 132.904 kg (293 lb)  BMI 44.55 kg/m2  SpO2 95% ; antihypertensive medications omitted this morning inadvertently General-Well-developed; no acute distress Body Habitus-moderately obese HEENT-[place]/AT; PERRL; EOM intact; conjunctiva and lids nl Neck-No JVD; no carotid bruits Endocrine-No thyromegaly Lungs-Clear lung fields; resonant percussion; normal I-to-E ratio Cardiovascular- normal PMI; normal S1 and S2; grade 1-2/6 systolic ejection murmur at the cardiac base. Abdomen-BS normal; soft and non-tender without masses or organomegaly Musculoskeletal-No deformities, cyanosis or clubbing Neurologic-Nl cranial nerves; symmetric strength and tone Skin- Warm, no significant lesions Extremities-Nl distal pulses; no edema  EKG:  Tracing performed 02/21/12 obtained and reviewed: Normal sinus rhythm, prior inferior MI.  No previous tracing for comparison.   ASSESSMENT AND PLAN:  Omar Bing, MD 03/14/2012 6:25 PM

## 2012-03-15 ENCOUNTER — Encounter: Payer: Self-pay | Admitting: *Deleted

## 2012-03-15 LAB — BASIC METABOLIC PANEL
Calcium: 9.3 mg/dL (ref 8.4–10.5)
Glucose, Bld: 106 mg/dL — ABNORMAL HIGH (ref 70–99)
Sodium: 143 mEq/L (ref 135–145)

## 2012-03-15 LAB — TSH: TSH: 3.08 u[IU]/mL (ref 0.350–4.500)

## 2012-03-17 ENCOUNTER — Encounter: Payer: Self-pay | Admitting: Cardiology

## 2012-03-22 ENCOUNTER — Inpatient Hospital Stay (HOSPITAL_COMMUNITY): Admission: RE | Admit: 2012-03-22 | Payer: Medicare Other | Source: Ambulatory Visit

## 2012-03-28 ENCOUNTER — Ambulatory Visit: Payer: Medicare Other | Attending: Pulmonary Disease | Admitting: Sleep Medicine

## 2012-03-28 DIAGNOSIS — R0609 Other forms of dyspnea: Secondary | ICD-10-CM | POA: Insufficient documentation

## 2012-03-28 DIAGNOSIS — G4761 Periodic limb movement disorder: Secondary | ICD-10-CM | POA: Insufficient documentation

## 2012-03-28 DIAGNOSIS — R5381 Other malaise: Secondary | ICD-10-CM | POA: Insufficient documentation

## 2012-03-28 DIAGNOSIS — G4733 Obstructive sleep apnea (adult) (pediatric): Secondary | ICD-10-CM | POA: Insufficient documentation

## 2012-03-28 DIAGNOSIS — G4700 Insomnia, unspecified: Secondary | ICD-10-CM

## 2012-03-28 DIAGNOSIS — R0989 Other specified symptoms and signs involving the circulatory and respiratory systems: Secondary | ICD-10-CM | POA: Insufficient documentation

## 2012-03-28 DIAGNOSIS — E669 Obesity, unspecified: Secondary | ICD-10-CM | POA: Insufficient documentation

## 2012-03-29 NOTE — Progress Notes (Signed)
Split night study ordered, however AHI <15 in 1st 2 hrs TST.  Study continued as diagnostic.  Respiratory events appeared mainly in supine position and increased in REM stage with oxygen desaturation as low as 70% in REM stage.  Pt stated he would use  PAP therapy if recommended.

## 2012-03-30 NOTE — Procedures (Signed)
[facility] Aleeyah Bensen A. Gerilyn Pilgrim, MD     [email or web address]         NAME:  Omar [name], Omar [name]               ACCOUNT NO.:  [id number]  MEDICAL RECORD NO.:  [id number]          PATIENT TYPE:  OUT  LOCATION:  SLEEP LAB                     FACILITY:  [facility]  PHYSICIAN:  Kateline Kinkade A. Gerilyn Pilgrim, M.D. DATE OF BIRTH:  19-Sep-1946  DATE OF STUDY:  03/28/2012                           NOCTURNAL POLYSOMNOGRAM  REFERRING PHYSICIAN:  Ramon Dredge L. Juanetta Gosling, M.D.  INDICATIONS:  A 66 year old man who presents with fatigue, snoring, obesity, and frequent awakenings.  The study is being done to evaluate for obstructive sleep apnea syndrome.  MEDICATIONS:  Torsemide, pravastatin, potassium, Glucovance, Zestoretic, Coreg.  EPWORTH SLEEPINESS SCALE:  5.  BMI:  49.  ARCHITECTURAL SUMMARY:  The total recording time is 376 minutes.  Sleep efficiency 58%.  Sleep latency 25 minutes.  REM latency 138 minutes. Stage N1 of 30%, N2 of 58%, N3 of 2.7%, and REM sleep 8.9%.  RESPIRATORY SUMMARY:  Baseline oxygen saturation is 95, lowest saturation 70 during REM sleep.  Diagnostic AHI is 22 and RDI 29.  The patient had a higher index during REM sleep with a REM AHI of 77.  LIMB MOVEMENT SUMMARY:  PLM index 45.8.  ELECTROCARDIOGRAM SUMMARY:  Average heart rate is 62 with no significant dysrhythmia is observed.  IMPRESSION: 1. Moderate obstructive sleep apnea syndrome. 2. Severe periodic limb movement disorder sleep. 3. Abnormal architecture with increased stage 1 sleep and reduced slow     wave sleep.  RECOMMENDATION:  Titration study.  Thanks for this referral.    Varie Machamer A. Gerilyn Pilgrim, M.D.    KAD/MEDQ  D:  03/29/2012 18:04:46  T:  03/30/2012 04:01:40  Job:  [id number]

## 2012-04-05 ENCOUNTER — Telehealth: Payer: Self-pay | Admitting: Cardiology

## 2012-04-05 NOTE — Telephone Encounter (Signed)
Patient's wife states that Dr.Hawkins wants [place] to schedule cath instead of Cardiac CT. We have not got anything from his office stating this. Knute Neu

## 2012-04-08 NOTE — Telephone Encounter (Signed)
Pt's wife, Omar [name], is advised that we have not received a recommendation from Dr. Juanetta Gosling concerning a cath for pt., she verbalized understanding./LV

## 2012-04-10 ENCOUNTER — Other Ambulatory Visit (HOSPITAL_COMMUNITY): Payer: Medicare Other

## 2012-04-15 ENCOUNTER — Ambulatory Visit: Payer: Medicare Other | Admitting: Cardiology

## 2012-04-17 ENCOUNTER — Encounter: Payer: Self-pay | Admitting: Cardiology

## 2012-05-22 ENCOUNTER — Ambulatory Visit: Payer: Medicare Other | Attending: Pulmonary Disease | Admitting: Sleep Medicine

## 2012-05-22 DIAGNOSIS — Z6841 Body Mass Index (BMI) 40.0 and over, adult: Secondary | ICD-10-CM | POA: Insufficient documentation

## 2012-05-22 DIAGNOSIS — G4700 Insomnia, unspecified: Secondary | ICD-10-CM

## 2012-05-22 DIAGNOSIS — G4733 Obstructive sleep apnea (adult) (pediatric): Secondary | ICD-10-CM | POA: Insufficient documentation

## 2012-05-28 ENCOUNTER — Ambulatory Visit (HOSPITAL_COMMUNITY)
Admission: RE | Admit: 2012-05-28 | Discharge: 2012-05-28 | Disposition: A | Discharge status: Home / Self Care | Payer: Medicare Other | Source: Ambulatory Visit | Attending: Cardiology | Admitting: Cardiology

## 2012-05-28 ENCOUNTER — Encounter (HOSPITAL_COMMUNITY)
Admission: RE | Disposition: A | Discharge status: Home / Self Care | Payer: Self-pay | Source: Ambulatory Visit | Attending: Cardiology

## 2012-05-28 DIAGNOSIS — E119 Type 2 diabetes mellitus without complications: Secondary | ICD-10-CM | POA: Insufficient documentation

## 2012-05-28 DIAGNOSIS — I10 Essential (primary) hypertension: Secondary | ICD-10-CM | POA: Insufficient documentation

## 2012-05-28 DIAGNOSIS — R0609 Other forms of dyspnea: Secondary | ICD-10-CM | POA: Insufficient documentation

## 2012-05-28 DIAGNOSIS — R0989 Other specified symptoms and signs involving the circulatory and respiratory systems: Secondary | ICD-10-CM | POA: Insufficient documentation

## 2012-05-28 DIAGNOSIS — E785 Hyperlipidemia, unspecified: Secondary | ICD-10-CM | POA: Insufficient documentation

## 2012-05-28 DIAGNOSIS — I2510 Atherosclerotic heart disease of native coronary artery without angina pectoris: Secondary | ICD-10-CM | POA: Insufficient documentation

## 2012-05-28 HISTORY — PX: LEFT HEART CATHETERIZATION WITH CORONARY ANGIOGRAM: SHX5451

## 2012-05-28 LAB — GLUCOSE, CAPILLARY
Glucose-Capillary: 145 mg/dL — ABNORMAL HIGH (ref 70–99)
Glucose-Capillary: 153 mg/dL — ABNORMAL HIGH (ref 70–99)

## 2012-05-28 SURGERY — LEFT HEART CATHETERIZATION WITH CORONARY ANGIOGRAM
Anesthesia: LOCAL

## 2012-05-28 MED ORDER — ACETAMINOPHEN 325 MG PO TABS: 650.0000 mg | ORAL_TABLET | ORAL | Status: DC | PRN

## 2012-05-28 MED ORDER — SODIUM CHLORIDE 0.9 % IV SOLN: 1.0000 mL/kg/h | INTRAVENOUS | Status: DC

## 2012-05-28 MED ORDER — VERAPAMIL HCL 2.5 MG/ML IV SOLN
INTRAVENOUS | Status: AC
  Filled 2012-05-28: qty 2

## 2012-05-28 MED ORDER — HEPARIN (PORCINE) IN NACL 2-0.9 UNIT/ML-% IJ SOLN
INTRAMUSCULAR | Status: AC
  Filled 2012-05-28: qty 2000

## 2012-05-28 MED ORDER — ASPIRIN 81 MG PO CHEW: 324.0000 mg | CHEWABLE_TABLET | ORAL | Status: DC

## 2012-05-28 MED ORDER — SODIUM CHLORIDE 0.9 % IJ SOLN: 3.0000 mL | Freq: Two times a day (BID) | INTRAMUSCULAR | Status: DC

## 2012-05-28 MED ORDER — NITROGLYCERIN 0.2 MG/ML ON CALL CATH LAB
INTRAVENOUS | Status: AC
  Filled 2012-05-28: qty 1

## 2012-05-28 MED ORDER — MIDAZOLAM HCL 2 MG/2ML IJ SOLN
INTRAMUSCULAR | Status: AC
  Filled 2012-05-28: qty 2

## 2012-05-28 MED ORDER — HYDROMORPHONE HCL PF 2 MG/ML IJ SOLN
INTRAMUSCULAR | Status: AC
  Filled 2012-05-28: qty 1

## 2012-05-28 MED ORDER — ONDANSETRON HCL 4 MG/2ML IJ SOLN: 4.0000 mg | Freq: Four times a day (QID) | INTRAMUSCULAR | Status: DC | PRN

## 2012-05-28 MED ORDER — LIDOCAINE HCL (PF) 1 % IJ SOLN
INTRAMUSCULAR | Status: AC
  Filled 2012-05-28: qty 30

## 2012-05-28 MED ORDER — SODIUM CHLORIDE 0.9 % IV SOLN: 250.0000 mL | INTRAVENOUS | Status: DC | PRN

## 2012-05-28 MED ORDER — SODIUM CHLORIDE 0.9 % IJ SOLN: 3.0000 mL | INTRAMUSCULAR | Status: DC | PRN

## 2012-05-28 MED ORDER — HEPARIN SODIUM (PORCINE) 1000 UNIT/ML IJ SOLN
INTRAMUSCULAR | Status: AC
  Filled 2012-05-28: qty 1

## 2012-05-28 MED ORDER — SODIUM CHLORIDE 0.9 % IV SOLN
INTRAVENOUS | Status: DC
  Administered 2012-05-28: 1000 mL via INTRAVENOUS

## 2012-05-28 NOTE — Interval H&P Note (Signed)
History and Physical Interval Note:  05/28/2012 8:50 AM  Omar [name]  has presented today for surgery, with the diagnosis of cp  The various methods of treatment have been discussed with the patient and family. After consideration of risks, benefits and other options for treatment, the patient has consented to  Procedure(s) (LRB): LEFT HEART CATHETERIZATION WITH CORONARY ANGIOGRAM (N/A) and possible angioplasty as a surgical intervention .  The patient's history has been reviewed, patient examined, no change in status, stable for surgery.  I have reviewed the patient's chart and labs.  Questions were answered to the patient's satisfaction.     Pamella Pert

## 2012-05-28 NOTE — CV Procedure (Signed)
Procedure performed:  Left heart catheterization including hemodynamic monitoring of the left ventricle, LV gram,  selective right and left coronary arteriography. Ascending aortogram.   Indication patient is a 66 year-old man with history of hypertension,  hyperlipidemia,  Diabetes Mellitus   who presents with dyspnea and abnormal EKG. Patient has  had refused and no non invasive testing performed.  Hence is brought to the cardiac catheterization lab to evaluate her coronary anatomy for definitive diagnosis of CAD. Ascending aortogram performed to evaluate anomalous circumflex coronary artery.   Hemodynamic data:  Left ventricular pressure was 125/12 with LVEDP of 26 mm mercury. Aortic pressure was 128/75 with a mean of 96 mm mercury.  The ascending aortogram: This reveals no evidence of pacing aortic aneurysm. There were 3 aortic valve cusps. The circumflex coronary artery dose from the right coronary cusp. It appeared to be normal.  Left ventricle: Performed in the RAO projection revealed LVEF of 60%. There was no MR. No Wall motion abnormality.  Right coronary artery: The vessel is normal, There was catheter induced proximal RCA spasm, relieved with catheter manipulation and IC NTG.   Dominant.  Left main coronary artery is large and normal. Continues as LAD and Circumflex coronary artery comes of right coronary cusp.  Circumflex coronary artery:  Anomalous origin. Circumflex coronary artery comes of right coronary cusp. A large vessel giving origin to a large obtuse marginal 1. Could not be cannulated via radial access due to aortic tortuosity and acute angle.    LAD:  LAD gives origin to a large diagonal-1.  LAD has  luminal irregularities.   Ramus Intermediate: Large vessel. Has ostial 80% stenosis.   Technique: Under sterile precautions using a 6 French right radial  arterial access, a 6 French sheath was introduced into the right radial artery. A 5 [place] Tig 4 catheter was advanced  into the ascending aorta and advanced into the left ventricle and left ventriculography was performed in the RAO projection. The catheter was then pulled out of the body. A 6 [place] JR 4 diagnostic catheter was then readvanced back into the ascending aorta. An Amplatzer stiff soft-tip straight wire was utilized to straighten the catheter do to severe tortuosity of the arch of the aorta. Selective  right coronary artery angiography was performed in multiple views. IC NTG given of 200 mcg. A 6 [place] JL4 diagnostic catheter was utilized to engage the left main coronary artery and angiography was performed. A utilized the same catheter to perform a descending aortogram to evaluate the origin of the circumflex coronary artery. The catheter was then pulled out of the body over the J-wire. I then advanced AR2, AL-1 6 French catheter and 5 French catheter respectively to try to engage the anomalous circumflex coronary artery. Because of difficulty in trying to engage this vessel, and because of frequent cannulation of the right coronary artery, the procedure was abandoned. There was no immediate complication. the catheter was pulled back Out of the body over exchange length J-wire.  Catheter exchanged out of the body over J-Wire. NO immediate complications noted. Patient tolerated the procedure well.   Rec: Medical therapy with aggressive risk factor modification. I will also bring the patient back for and planned intervention via femoral arterial access to the ramus intermediate branch ostial stenosis. At that time also reattempt to engage the anomalous circumflex carotid artery which appears to be normal when visualized through a descending aortogram.   Disposition: Will be discharged home today with outpatient follow up. He'll  be scheduled for elective angioplasty of the ramus intermediate branch of the left main coronary artery ostial stenosis. 185 cc of contrast was utilized for diagnostic angiography due to  difficult anatomy.

## 2012-05-28 NOTE — H&P (Signed)
  Please see paper chart  

## 2012-05-28 NOTE — Procedures (Signed)
NAME:  Omar [name], Omar [name]               ACCOUNT NO.:  [id number]  MEDICAL RECORD NO.:  [id number]          PATIENT TYPE:  OUT  LOCATION:  SLEEP LAB                     FACILITY:  [facility]  PHYSICIAN:  Saraiah Bhat A. Gerilyn Pilgrim, M.D. DATE OF BIRTH:  Feb 18, 1946  DATE OF STUDY:  05/22/2012                           NOCTURNAL POLYSOMNOGRAM  REFERRING PHYSICIAN:  Ramon Dredge L. Juanetta Gosling, M.D.  INDICATIONS:  This is a 66 year old man who had a previous study diagnosed with obstructive sleep apnea syndrome.  This is a CPAP titration recording.  MEDICATIONS:  Torsemide, pravastatin, potassium, Glucovance, Zestoretic, Coreg.  EPWORTH SLEEPINESS SCALE: 1. BMI 45.  ARCHITECTURAL SUMMARY:  This is a full night titration recording.  The total recording time is 366 minutes.  Sleep efficiency 70%, sleep latency 10 minutes, REM latency 170 minutes.  Stage N1 19%, N2 65%, N3 0%, and REM sleep 15%.  RESPIRATORY SUMMARY:  Baseline oxygen saturation is 93, lowest saturation 75 during non-REM sleep.  The patient was placed on positive pressure starting at 5 and titrated to a pressure of 14.  He did well on a pressure of 9 until he went into REM sleep.  At that time, he appeared to have desaturations and events, especially with eye movements.  He had to be titrated to a pressure of 14 and 14 appeared to be the optimal pressure.  LIMB MOVEMENT SUMMARY:  PLM index is 15.  ELECTROCARDIOGRAM SUMMARY:  Average heart rate is 58 with no significant dysrhythmias observed.  IMPRESSION: 1. Obstructive sleep apnea syndrome, which responds optimally to a     CPAP of 14. 2. Moderate periodic limb movement disorder sleep.    Angelis Gates A. Gerilyn Pilgrim, M.D.    KAD/MEDQ  D:  05/28/2012 09:09:48  T:  05/28/2012 09:39:58  Job:  [id number]

## 2012-05-29 ENCOUNTER — Encounter (HOSPITAL_COMMUNITY): Payer: Self-pay | Admitting: Pharmacy Technician

## 2012-06-04 ENCOUNTER — Encounter (HOSPITAL_COMMUNITY)
Admission: RE | Disposition: A | Discharge status: Home / Self Care | Payer: Self-pay | Source: Ambulatory Visit | Attending: Cardiology

## 2012-06-04 ENCOUNTER — Encounter (HOSPITAL_COMMUNITY): Payer: Self-pay

## 2012-06-04 ENCOUNTER — Encounter (HOSPITAL_COMMUNITY): Payer: Self-pay | Admitting: General Practice

## 2012-06-04 ENCOUNTER — Ambulatory Visit (HOSPITAL_COMMUNITY)
Admission: RE | Admit: 2012-06-04 | Discharge: 2012-06-05 | Disposition: A | Discharge status: Home / Self Care | Payer: Medicare Other | Source: Ambulatory Visit | Attending: Cardiology | Admitting: Cardiology

## 2012-06-04 ENCOUNTER — Ambulatory Visit (HOSPITAL_COMMUNITY): Admit: 2012-06-04 | Payer: Self-pay | Admitting: Cardiology

## 2012-06-04 DIAGNOSIS — G4730 Sleep apnea, unspecified: Secondary | ICD-10-CM

## 2012-06-04 DIAGNOSIS — E785 Hyperlipidemia, unspecified: Secondary | ICD-10-CM | POA: Insufficient documentation

## 2012-06-04 DIAGNOSIS — R000 Tachycardia, unspecified: Secondary | ICD-10-CM | POA: Insufficient documentation

## 2012-06-04 DIAGNOSIS — E119 Type 2 diabetes mellitus without complications: Secondary | ICD-10-CM | POA: Insufficient documentation

## 2012-06-04 DIAGNOSIS — E669 Obesity, unspecified: Secondary | ICD-10-CM

## 2012-06-04 DIAGNOSIS — I2510 Atherosclerotic heart disease of native coronary artery without angina pectoris: Secondary | ICD-10-CM

## 2012-06-04 DIAGNOSIS — Z9861 Coronary angioplasty status: Secondary | ICD-10-CM

## 2012-06-04 DIAGNOSIS — I10 Essential (primary) hypertension: Secondary | ICD-10-CM | POA: Insufficient documentation

## 2012-06-04 DIAGNOSIS — Z955 Presence of coronary angioplasty implant and graft: Secondary | ICD-10-CM

## 2012-06-04 HISTORY — DX: Atherosclerotic heart disease of native coronary artery without angina pectoris: I25.10

## 2012-06-04 HISTORY — DX: Cardiac murmur, unspecified: R01.1

## 2012-06-04 HISTORY — PX: CORONARY ANGIOPLASTY WITH STENT PLACEMENT: SHX49

## 2012-06-04 HISTORY — PX: PERCUTANEOUS CORONARY STENT INTERVENTION (PCI-S): SHX5485

## 2012-06-04 LAB — BASIC METABOLIC PANEL
Calcium: 9.6 mg/dL (ref 8.4–10.5)
Creatinine, Ser: 0.83 mg/dL (ref 0.50–1.35)
GFR calc Af Amer: >90 mL/min (ref >90)
GFR calc non Af Amer: 88 mL/min — ABNORMAL LOW (ref >90)
Sodium: 142 mEq/L (ref 135–145)

## 2012-06-04 LAB — TROPONIN I: Troponin I: <0.3 ng/mL (ref <0.30)

## 2012-06-04 LAB — HEMOGLOBIN A1C: Hgb A1c MFr Bld: 6.4 % — ABNORMAL HIGH (ref <5.7)

## 2012-06-04 LAB — CBC
MCH: 29.1 pg (ref 26.0–34.0)
MCV: 87.4 fL (ref 78.0–100.0)
Platelets: 243 10*3/uL (ref 150–400)
RDW: 14.5 % (ref 11.5–15.5)

## 2012-06-04 LAB — CK TOTAL AND CKMB (NOT AT ARMC)
CK, MB: 2.5 ng/mL (ref 0.3–4.0)
Relative Index: INVALID (ref 0.0–2.5)
Relative Index: INVALID (ref 0.0–2.5)
Total CK: 55 U/L (ref 7–232)
Total CK: 58 U/L (ref 7–232)

## 2012-06-04 LAB — GLUCOSE, CAPILLARY

## 2012-06-04 LAB — PROTIME-INR: Prothrombin Time: 13.2 seconds (ref 11.6–15.2)

## 2012-06-04 SURGERY — PERCUTANEOUS CORONARY STENT INTERVENTION (PCI-S)
Anesthesia: LOCAL

## 2012-06-04 MED ORDER — ASPIRIN 81 MG PO CHEW
324.0000 mg | CHEWABLE_TABLET | ORAL | Status: AC
  Administered 2012-06-04: 324 mg via ORAL

## 2012-06-04 MED ORDER — SODIUM CHLORIDE 0.9 % IJ SOLN: 3.0000 mL | INTRAMUSCULAR | Status: DC | PRN

## 2012-06-04 MED ORDER — SODIUM CHLORIDE 0.9 % IJ SOLN: 3.0000 mL | Freq: Two times a day (BID) | INTRAMUSCULAR | Status: DC

## 2012-06-04 MED ORDER — BIVALIRUDIN 250 MG IV SOLR
1.0000 mg/kg/h | INTRAVENOUS | Status: DC
  Filled 2012-06-04: qty 250

## 2012-06-04 MED ORDER — BIVALIRUDIN 250 MG IV SOLR
INTRAVENOUS | Status: AC
  Filled 2012-06-04: qty 250

## 2012-06-04 MED ORDER — ONDANSETRON HCL 4 MG/2ML IJ SOLN: 4.0000 mg | Freq: Four times a day (QID) | INTRAMUSCULAR | Status: DC | PRN

## 2012-06-04 MED ORDER — ACETAMINOPHEN 325 MG PO TABS: 650.0000 mg | ORAL_TABLET | ORAL | Status: DC | PRN

## 2012-06-04 MED ORDER — ASPIRIN 81 MG PO CHEW
CHEWABLE_TABLET | ORAL | Status: AC
  Filled 2012-06-04: qty 4

## 2012-06-04 MED ORDER — HYDROMORPHONE HCL PF 2 MG/ML IJ SOLN
INTRAMUSCULAR | Status: AC
  Filled 2012-06-04: qty 1

## 2012-06-04 MED ORDER — HYDROCHLOROTHIAZIDE 25 MG PO TABS
25.0000 mg | ORAL_TABLET | Freq: Every day | ORAL | Status: DC
  Administered 2012-06-05: 25 mg via ORAL
  Filled 2012-06-04 (×2): qty 1

## 2012-06-04 MED ORDER — GLYBURIDE-METFORMIN 5-500 MG PO TABS: 2.0000 | ORAL_TABLET | Freq: Two times a day (BID) | ORAL | Status: DC

## 2012-06-04 MED ORDER — ASPIRIN 81 MG PO TABS: 81.0000 mg | ORAL_TABLET | Freq: Every day | ORAL | Status: DC

## 2012-06-04 MED ORDER — VERAPAMIL HCL ER 240 MG PO TBCR
240.0000 mg | EXTENDED_RELEASE_TABLET | Freq: Every day | ORAL | Status: DC
  Administered 2012-06-04: 240 mg via ORAL
  Filled 2012-06-04 (×2): qty 1

## 2012-06-04 MED ORDER — MIDAZOLAM HCL 2 MG/2ML IJ SOLN
INTRAMUSCULAR | Status: AC
  Filled 2012-06-04: qty 2

## 2012-06-04 MED ORDER — TICAGRELOR 90 MG PO TABS
90.0000 mg | ORAL_TABLET | Freq: Two times a day (BID) | ORAL | Status: DC
  Administered 2012-06-04 – 2012-06-05 (×2): 90 mg via ORAL
  Filled 2012-06-04 (×3): qty 1

## 2012-06-04 MED ORDER — LINAGLIPTIN 5 MG PO TABS
5.0000 mg | ORAL_TABLET | Freq: Every day | ORAL | Status: DC
  Administered 2012-06-04 – 2012-06-05 (×2): 5 mg via ORAL
  Filled 2012-06-04 (×2): qty 1

## 2012-06-04 MED ORDER — TICAGRELOR 90 MG PO TABS
ORAL_TABLET | ORAL | Status: AC
  Filled 2012-06-04: qty 1

## 2012-06-04 MED ORDER — TAMSULOSIN HCL 0.4 MG PO CAPS
0.4000 mg | ORAL_CAPSULE | Freq: Every day | ORAL | Status: DC
  Administered 2012-06-05: 0.4 mg via ORAL
  Filled 2012-06-04 (×3): qty 1

## 2012-06-04 MED ORDER — SIMVASTATIN 40 MG PO TABS: 40.0000 mg | ORAL_TABLET | Freq: Every day | ORAL | Status: DC

## 2012-06-04 MED ORDER — HYDROMORPHONE HCL PF 1 MG/ML IJ SOLN: 0.5000 mg | INTRAMUSCULAR | Status: DC | PRN

## 2012-06-04 MED ORDER — METFORMIN HCL 500 MG PO TABS
1000.0000 mg | ORAL_TABLET | Freq: Two times a day (BID) | ORAL | Status: DC
  Filled 2012-06-04: qty 2

## 2012-06-04 MED ORDER — SODIUM CHLORIDE 0.9 % IV SOLN: 250.0000 mL | INTRAVENOUS | Status: DC | PRN

## 2012-06-04 MED ORDER — NEBIVOLOL HCL 10 MG PO TABS
20.0000 mg | ORAL_TABLET | Freq: Every day | ORAL | Status: DC
  Administered 2012-06-04: 20 mg via ORAL
  Filled 2012-06-04 (×2): qty 2

## 2012-06-04 MED ORDER — SODIUM CHLORIDE 0.9 % IV SOLN
INTRAVENOUS | Status: DC
  Administered 2012-06-04: 1000 mL via INTRAVENOUS

## 2012-06-04 MED ORDER — POTASSIUM CHLORIDE CRYS ER 20 MEQ PO TBCR
20.0000 meq | EXTENDED_RELEASE_TABLET | Freq: Every day | ORAL | Status: DC
  Administered 2012-06-05: 20 meq via ORAL
  Filled 2012-06-04: qty 1

## 2012-06-04 MED ORDER — INSULIN ASPART 100 UNIT/ML ~~LOC~~ SOLN
0.0000 [IU] | Freq: Three times a day (TID) | SUBCUTANEOUS | Status: DC
  Administered 2012-06-04: 18:00:00 5 [IU] via SUBCUTANEOUS
  Administered 2012-06-05: 2 [IU] via SUBCUTANEOUS

## 2012-06-04 MED ORDER — TICAGRELOR 90 MG PO TABS
180.0000 mg | ORAL_TABLET | Freq: Once | ORAL | Status: AC
  Administered 2012-06-04: 180 mg via ORAL
  Filled 2012-06-04: qty 2

## 2012-06-04 MED ORDER — LISINOPRIL-HYDROCHLOROTHIAZIDE 20-25 MG PO TABS: 1.0000 | ORAL_TABLET | Freq: Every day | ORAL | Status: DC

## 2012-06-04 MED ORDER — SODIUM CHLORIDE 0.9 % IV SOLN
1.0000 mL/kg/h | INTRAVENOUS | Status: AC
  Administered 2012-06-04: 16:00:00 1 mL/kg/h via INTRAVENOUS

## 2012-06-04 MED ORDER — ASPIRIN EC 81 MG PO TBEC
81.0000 mg | DELAYED_RELEASE_TABLET | Freq: Every day | ORAL | Status: DC
  Administered 2012-06-05: 09:00:00 81 mg via ORAL
  Filled 2012-06-04: qty 1

## 2012-06-04 MED ORDER — SODIUM CHLORIDE 0.9 % IV SOLN: 250.0000 mL | INTRAVENOUS | Status: DC

## 2012-06-04 MED ORDER — LISINOPRIL 20 MG PO TABS
20.0000 mg | ORAL_TABLET | Freq: Every day | ORAL | Status: DC
  Administered 2012-06-05: 09:00:00 20 mg via ORAL
  Filled 2012-06-04 (×2): qty 1

## 2012-06-04 MED ORDER — PRAVASTATIN SODIUM 40 MG PO TABS
40.0000 mg | ORAL_TABLET | Freq: Every day | ORAL | Status: DC
  Administered 2012-06-04: 19:00:00 40 mg via ORAL
  Filled 2012-06-04 (×2): qty 1

## 2012-06-04 MED ORDER — HYDROMORPHONE HCL 1 MG/ML PO LIQD: 0.5000 mg | ORAL | Status: DC | PRN

## 2012-06-04 MED ORDER — ZOLPIDEM TARTRATE 5 MG PO TABS
5.0000 mg | ORAL_TABLET | Freq: Every evening | ORAL | Status: DC | PRN
  Administered 2012-06-04: 5 mg via ORAL
  Filled 2012-06-04: qty 1

## 2012-06-04 MED ORDER — ALPRAZOLAM 0.25 MG PO TABS
0.5000 mg | ORAL_TABLET | ORAL | Status: DC | PRN
  Administered 2012-06-04: 11:00:00 0.5 mg via ORAL
  Filled 2012-06-04: qty 2

## 2012-06-04 MED ORDER — TICAGRELOR 90 MG PO TABS
ORAL_TABLET | ORAL | Status: AC
  Administered 2012-06-04: 180 mg via ORAL
  Filled 2012-06-04: qty 1

## 2012-06-04 MED ORDER — GLYBURIDE 5 MG PO TABS
10.0000 mg | ORAL_TABLET | Freq: Two times a day (BID) | ORAL | Status: DC
  Administered 2012-06-04 – 2012-06-05 (×2): 10 mg via ORAL
  Filled 2012-06-04 (×5): qty 2

## 2012-06-04 NOTE — Plan of Care (Signed)
Problem: Phase II Progression Outcomes Goal: Pain controlled Outcome: Completed/Met Date Met:  06/04/12 Patient pain free Goal: OOB to chair per PCI oders Outcome: Completed/Met Date Met:  06/04/12 Patient sitting in lounge chair. Tolerating activity. Independently transferred to chair with steady gait.  Goal: Vascular site scale level 0 - I Vascular Site Scale Level 0: No bruising/bleeding/hematoma Level I (Mild): Bruising/Ecchymosis, minimal bleeding/ooozing, palpable hematoma < 3 cm Level II (Moderate): Bleeding not affecting hemodynamic parameters, pseudoaneurysm, palpable hematoma > 3 cm Outcome: Completed/Met Date Met:  06/04/12 Level 0 right groin, gauze dressing with stable amount of bloody drainage. Unchanged for hours, oozing on admission to unit. Goal: No post PCI angina Outcome: Completed/Met Date Met:  06/04/12 No complaints of chest pain or shortness of breath. Goal: Distal pulses equal baseline assessment Outcome: Completed/Met Date Met:  06/04/12 Palpable +1 dp and pt bilaterally Goal: Discharge plan established Outcome: Completed/Met Date Met:  06/04/12 Planned discharge home tomorrow Goal: Tolerating diet Outcome: Completed/Met Date Met:  06/04/12 Tolerating diet, lunch and snacks

## 2012-06-04 NOTE — H&P (View-Only) (Signed)
  Please see paper chart  

## 2012-06-04 NOTE — CV Procedure (Signed)
Procedure performed: 1. PTCA and stenting of the ostial Ramus Intermediate with implantation of a 2.75x64mm Promus Element DES with correction of stenosis from 80% to 0% with brisk TIMI-3 to TIMI-3 flow maintained in the procedure. 2. Right femoral arteriogram and closure of the right femoral arterial access with 6 French Angio-Seal  Indication:  Indication patient is a 66 year-old man with history of hypertension, hyperlipidemia, Diabetes Mellitus who presents with worsening dyspnea and abnormal EKG. Patient has had refused and no non invasive testing performed. 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 I initially estimated the stenosis to be around 90-95%. He was scheduled for elective angioplasty of the ramus intermediate branch.   Technique: Under sterile precautions using a 6 French right femoral arterial access, which was obtained via radiographic guidance with utilization of a micropuncture needle, a 6 French sheath was introduced into the right femoral artery. I exchanged the short sheath to the 25 cm sheath due to morbid obesity. I utilized a 6 [place] AL-1 catheter and attempted to cannulate the aberrant circumflex coronary artery that arose from the right coronary cusp. After I was able to visualized by nonselective descending aortogram, I abandoned the procedure and proceeded with intervention of the ramus intermediate branch.  Technique of intervention:  I initially attempted to cannulate the anomalous circumflex coronary artery with use of a 6 [place] AL-1 catheter. However in ascending aortogram performed revealed excellent flow and no evidence of stenosis and it was well visualized. Hence to conserve contrast I abandoned selective cannulation of the normal aberrant origin circumflex coronary artery. Using a 6  FrenchXB 3.5 guide catheter theLM  coronary  was selected and cannulated. Using Angiomax for anticoagulation, I utilized a Couger guidewire and across the RI coronary artery without any difficulty. I placed the tip of the wire into the distal  coronary artery. Angiography was performed.   I proceeded with implantation of a 2.75x12 mm Promus Element  drug-eluting stent into the RI ostial coronary artery. The stent was deployed at 14 atmospheric pressure for 60  seconds. The stent was then post dilated with a 2.75x8 mm [place] Apex balloon. Post-balloon angioplasty results were excellent with 0% residual stenoses and TIMI-3 flow was maintained. There was no evidence of edge dissection. IC 200 MCG given.  The guidewire was withdrawn out of the body and the guide catheter was engaged and pulled out of the body over the J-wire the was no immediate complication. Multiple cineangiograms were performed in different angles specifically to look at the distal edge as the distal edge of the stent was placed immediately at the bifurcation of the ramus intermediate branch. There was mild disease that is evident previous to the stent and in the second ramus intermediate branch however this was not compromised. Patient tolerated the procedure well.  Disposition: Patient will be discharged in AM unless complications with out-patient follow up. Will need Dual antiplatelet therapy with Brilinta and ASA 81 mg for at least 1 year.

## 2012-06-04 NOTE — Interval H&P Note (Signed)
History and Physical Interval Note:  06/04/2012 7:53 AM  Omar [name]  has presented today for surgery, with the diagnosis of cad  The various methods of treatment have been discussed with the patient and family. After consideration of risks, benefits and other options for treatment, the patient has consented to  Procedure(s) (LRB): PERCUTANEOUS CORONARY STENT INTERVENTION (PCI-S) (N/A) as a surgical intervention .  The patient's history has been reviewed, patient examined, no change in status, stable for surgery.  I have reviewed the patient's chart and labs.  Questions were answered to the patient's satisfaction.     Pamella Pert

## 2012-06-05 DIAGNOSIS — Z9861 Coronary angioplasty status: Secondary | ICD-10-CM

## 2012-06-05 DIAGNOSIS — I2510 Atherosclerotic heart disease of native coronary artery without angina pectoris: Secondary | ICD-10-CM

## 2012-06-05 LAB — BASIC METABOLIC PANEL
CO2: 31 mEq/L (ref 19–32)
Calcium: 9.4 mg/dL (ref 8.4–10.5)
Creatinine, Ser: 0.78 mg/dL (ref 0.50–1.35)
Glucose, Bld: 81 mg/dL (ref 70–99)

## 2012-06-05 LAB — CBC
Hemoglobin: 13.3 g/dL (ref 13.0–17.0)
MCH: 28.7 pg (ref 26.0–34.0)
MCV: 86.4 fL (ref 78.0–100.0)
RBC: 4.64 MIL/uL (ref 4.22–5.81)

## 2012-06-05 LAB — GLUCOSE, CAPILLARY: Glucose-Capillary: 145 mg/dL — ABNORMAL HIGH (ref 70–99)

## 2012-06-05 MED ORDER — VERAPAMIL HCL ER 240 MG PO TBCR
240.0000 mg | EXTENDED_RELEASE_TABLET | Freq: Every day | ORAL | Status: DC
  Filled 2012-06-05: qty 1

## 2012-06-05 MED ORDER — TICAGRELOR 90 MG PO TABS: 90.0000 mg | ORAL_TABLET | Freq: Two times a day (BID) | ORAL | Status: DC

## 2012-06-05 NOTE — Care Management Note (Signed)
    Page 1 of 1   06/05/2012     11:16:55 AM   CARE MANAGEMENT NOTE 06/05/2012  Patient:  Omar [name], Omar [name]   Account Number:  [id number]  Date Initiated:  06/05/2012  Documentation initiated by:  Fransico Michael  Subjective/Objective Assessment:   admitted on 06/05/12 with c/o chest pain     Action/Plan:   prior to admission, patient lived at home with family support. Independent with ADLs.   Anticipated DC Date:  06/05/2012   Anticipated DC Plan:  HOME/SELF CARE      DC Planning Services  CM consult  Medication Assistance      Choice offered to / List presented to:             Status of service:  Completed, signed off Medicare Important Message given?   (If response is "NO", the following Medicare IM given date fields will be blank) Date Medicare IM given:   Date Additional Medicare IM given:    Discharge Disposition:  HOME/SELF CARE  Per UR Regulation:  Reviewed for med. necessity/level of care/duration of stay  If discussed at Long Length of Stay Meetings, dates discussed:    Comments:  06/05/12-0953-J.Cam Dauphin,RN,BSN [phone]      In to see patient regarding discharging on Brilinta. Free 30 day supply card given to patient by RN. Discussed with patient regarding reduced co-pays. No further needs identified. Discharging to home today.

## 2012-06-05 NOTE — Discharge Summary (Signed)
Physician Discharge Summary  Patient ID: Omar [name] MRN: [id number] DOB/AGE: 02-09-1946 66 y.o.  Admit date: 06/04/2012 Discharge date: 06/05/2012  Primary Discharge Diagnosis Shortness of breath and dyspnea on exertion due to 2 coronary artery disease. Patient experiencing worsening dyspnea in the recent past. Status post PTCA and stenting of the ramus intermediate branch with implantation of a 2.75 x 12 mm promos element drug-eluting stent of found on 07/04/2012.  Secondary Discharge Diagnosis Diabetes mellitus type 2 controlled Hyperlipidemia Hypertension Morbid obesity Wide-complex tachycardia (7 beat run early this morning)  Significant Diagnostic Studies: PTCA and stenting performed on 06/04/2012 in elective fashion.  Consults:   Hospital Course: Patient underwent diagnostic cardiac catheterization on 05/29/2012 via right radial arterial access. It was difficult to cannulize it the coronary arteries do to significant tortuosity. 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 He was discharged home with a plan for elective angioplasty the following week. He underwent successful angioplasty yesterday without any complications. He did have a brief 7 beat run of wide-complex tachycardia which appeared to be either atrial fibrillation with aberrancy versus short run of nonsustained mental tachycardia. Patient was felt to be stable for discharge the following day. His labs are within normal limits.   Discharge Exam: Blood pressure 147/85, pulse 58, temperature 97.7 F (36.5 C), temperature source Oral, resp. rate 21, height 5\' 8"  (1.727 m), weight 141.5 kg (311 lb 15.2 oz), SpO2 97.00%.     General appearance: alert, cooperative, appears stated age, no distress and morbidly obese Resp: clear to auscultation bilaterally Cardio: S1, S2  normal and II/VI SEM int he apex. No gallop Extremities: edema 2 plus bilateral below knee. FROM. No tenderness Incision/Wound: there is mild bruising noted in the right iliac fossa. There is no bruit. Is no significant tenderness or hematoma. Labs:   Lab Results  Component Value Date   WBC 9.3 06/05/2012   HGB 13.3 06/05/2012   HCT 40.1 06/05/2012   MCV 86.4 06/05/2012   PLT 237 06/05/2012    Lab 06/05/12 0535  NA 142  K 3.6  CL 103  CO2 31  BUN 18  CREATININE 0.78  CALCIUM 9.4  PROT --  BILITOT --  ALKPHOS --  ALT --  AST --  GLUCOSE 81   Cardiac Panel (last 3 results)  Basename 06/04/12 2216 06/04/12 1717 06/04/12 1412  CKTOTAL 58 55 52  CKMB 2.5 2.3 2.3  TROPONINI <0.30 <0.30 <0.30  RELINDX RELATIVE INDEX IS INVALID RELATIVE INDEX IS INVALID RELATIVE INDEX IS INVALID      EKG: 06/05/2012 Sinus bradycardia at the rate of 60 beats a minute. Poor R. wave progression. No evidence of ischemia. Normal axis and normal intervals. No change from yesterday.  FOLLOW UP PLANS AND APPOINTMENTS  Patient will need BRILINTA for a period of one year along with aspirin 81 mg by mouth daily. 30 day supply has been given to him and 90 day supply with made to the mail-order pharmacy.   Discharge Orders    Future Orders Please Complete By Expires   Amb Referral to Cardiac Rehabilitation      Discharge instructions        Medication List  As of 06/05/2012  8:40 AM   TAKE these medications         aspirin 81 MG tablet   Take 81 mg by mouth  daily.      glyBURIDE-metformin 5-500 MG per tablet   Commonly known as: GLUCOVANCE   Take 2 tablets by mouth 2 (two) times daily with a meal.      lisinopril-hydrochlorothiazide 20-25 MG per tablet   Commonly known as: PRINZIDE,ZESTORETIC   Take 1 tablet by mouth daily.      nebivolol 10 MG tablet   Commonly known as: BYSTOLIC   Take 20 mg by mouth daily.      ONGLYZA 5 MG Tabs tablet   Generic drug: saxagliptin HCl   Take 5 mg by mouth  daily.      potassium chloride SA 20 MEQ tablet   Commonly known as: K-DUR,KLOR-CON   Take 20 mEq by mouth daily.      pravastatin 40 MG tablet   Commonly known as: PRAVACHOL   Take 40 mg by mouth daily.      Tamsulosin HCl 0.4 MG Caps   Commonly known as: FLOMAX   Take 0.4 mg by mouth daily after breakfast.      Ticagrelor 90 MG Tabs tablet   Commonly known as: BRILINTA   Take 1 tablet (90 mg total) by mouth 2 (two) times daily.      verapamil 240 MG CR tablet   Commonly known as: CALAN-SR   Take 240 mg by mouth at bedtime.           Follow-up Information    Follow up with Pamella Pert, MD. (Keep appointment)    Contact information:   [address]  [place] [address] [phone]           Pamella Pert, MD 06/05/2012, 8:40 AM

## 2012-06-05 NOTE — Progress Notes (Signed)
Pts. HR was 50's-60's, due with his meds of verapamil and bystolic, paged Dr. Jacinto Halim for parameters for BP and HR,   With order to hold verapamil for HR <50, ok to give the bystolic as ordered. Will cont. To monitor.

## 2012-06-05 NOTE — Progress Notes (Signed)
CARDIAC REHAB PHASE I   PRE:  Rate/Rhythm: 63SR  BP:  Supine:   Sitting: 142/72  Standing:    SaO2:   MODE:  Ambulation: 500 ft   POST:  Rate/Rhythem: 78SR  BP:  Supine:   Sitting: 146/75  Standing:    SaO2:  0740-0840 Pt walked 500 ft on RA with steady gait. Denied chest pain. Tolerated well. Education completed with pt and wife. Permission given to refer to [place] Phase 2. To sitting on side of bed after walk. Gave pt diabetic and heart healthy diets.  Omar [name]

## 2012-06-12 ENCOUNTER — Other Ambulatory Visit (HOSPITAL_COMMUNITY): Payer: Self-pay | Admitting: Cardiology

## 2012-06-12 DIAGNOSIS — R0989 Other specified symptoms and signs involving the circulatory and respiratory systems: Secondary | ICD-10-CM

## 2012-06-14 ENCOUNTER — Ambulatory Visit (HOSPITAL_COMMUNITY)
Admission: RE | Admit: 2012-06-14 | Discharge: 2012-06-14 | Disposition: A | Discharge status: Home / Self Care | Payer: Medicare Other | Source: Ambulatory Visit | Attending: Cardiology | Admitting: Cardiology

## 2012-06-14 DIAGNOSIS — I10 Essential (primary) hypertension: Secondary | ICD-10-CM | POA: Insufficient documentation

## 2012-06-14 DIAGNOSIS — R0989 Other specified symptoms and signs involving the circulatory and respiratory systems: Secondary | ICD-10-CM

## 2012-06-14 DIAGNOSIS — E119 Type 2 diabetes mellitus without complications: Secondary | ICD-10-CM | POA: Insufficient documentation

## 2012-06-27 ENCOUNTER — Encounter (HOSPITAL_COMMUNITY)
Admission: RE | Admit: 2012-06-27 | Discharge: 2012-06-27 | Disposition: A | Discharge status: Home / Self Care | Payer: Medicare Other | Source: Ambulatory Visit | Attending: Cardiology | Admitting: Cardiology

## 2012-06-27 ENCOUNTER — Encounter (HOSPITAL_COMMUNITY): Payer: Self-pay

## 2012-06-27 VITALS — BP 138/80 | HR 52 | Ht 68.0 in | Wt 303.4 lb

## 2012-06-27 DIAGNOSIS — Z9861 Coronary angioplasty status: Secondary | ICD-10-CM

## 2012-06-27 DIAGNOSIS — I10 Essential (primary) hypertension: Secondary | ICD-10-CM | POA: Insufficient documentation

## 2012-06-27 DIAGNOSIS — I2510 Atherosclerotic heart disease of native coronary artery without angina pectoris: Secondary | ICD-10-CM | POA: Insufficient documentation

## 2012-06-27 DIAGNOSIS — E119 Type 2 diabetes mellitus without complications: Secondary | ICD-10-CM | POA: Insufficient documentation

## 2012-06-27 DIAGNOSIS — Z5189 Encounter for other specified aftercare: Secondary | ICD-10-CM | POA: Insufficient documentation

## 2012-06-27 NOTE — Progress Notes (Signed)
Patient referred to Cardiac Rehab by Dr. Jacinto Halim due to Stent Placement V45.82. During orientation advised patient on arrival and appointment times what to wear, what to do before, during and after exercise. Reviewed attendance and class policy. Talked about inclement weather and class consultation policy. Pt is scheduled to start Cardiac Rehab on 07/01/12 at 6:45 am. Pt was advised to come to class 5 minutes before class starts. He was also given instructions on meeting with the dietician and attending the Family Structure classes. Pt is eager to get started.

## 2012-06-27 NOTE — Patient Instructions (Signed)
Pt has finished orientation and is scheduled to start CR on 07/01/12 at 6:45 am. Pt has been instructed to arrive to class 15 minutes early for scheduled class. Pt has been instructed to wear comfortable clothing and shoes with rubber soles. Pt has been told to take their medications 1 hour prior to coming to class.  If the patient is not going to attend class, he/she has been instructed to call.

## 2012-07-01 ENCOUNTER — Encounter (HOSPITAL_COMMUNITY)
Admission: RE | Admit: 2012-07-01 | Discharge: 2012-07-01 | Disposition: A | Discharge status: Home / Self Care | Payer: Medicare Other | Source: Ambulatory Visit | Attending: Cardiology | Admitting: Cardiology

## 2012-07-02 ENCOUNTER — Encounter (HOSPITAL_COMMUNITY): Payer: Medicare Other

## 2012-07-03 ENCOUNTER — Encounter (HOSPITAL_COMMUNITY)
Admission: RE | Admit: 2012-07-03 | Discharge: 2012-07-03 | Disposition: A | Discharge status: Home / Self Care | Payer: Medicare Other | Source: Ambulatory Visit | Attending: Cardiology | Admitting: Cardiology

## 2012-07-03 DIAGNOSIS — E119 Type 2 diabetes mellitus without complications: Secondary | ICD-10-CM | POA: Insufficient documentation

## 2012-07-03 DIAGNOSIS — Z5189 Encounter for other specified aftercare: Secondary | ICD-10-CM | POA: Insufficient documentation

## 2012-07-03 DIAGNOSIS — I2510 Atherosclerotic heart disease of native coronary artery without angina pectoris: Secondary | ICD-10-CM | POA: Insufficient documentation

## 2012-07-03 DIAGNOSIS — I10 Essential (primary) hypertension: Secondary | ICD-10-CM | POA: Insufficient documentation

## 2012-07-05 ENCOUNTER — Encounter (HOSPITAL_COMMUNITY)
Admission: RE | Admit: 2012-07-05 | Discharge: 2012-07-05 | Disposition: A | Discharge status: Home / Self Care | Payer: Medicare Other | Source: Ambulatory Visit | Attending: Cardiology | Admitting: Cardiology

## 2012-07-08 ENCOUNTER — Encounter (HOSPITAL_COMMUNITY)
Admission: RE | Admit: 2012-07-08 | Discharge: 2012-07-08 | Disposition: A | Discharge status: Home / Self Care | Payer: Medicare Other | Source: Ambulatory Visit | Attending: Cardiology | Admitting: Cardiology

## 2012-07-10 ENCOUNTER — Encounter (HOSPITAL_COMMUNITY)
Admission: RE | Admit: 2012-07-10 | Discharge: 2012-07-10 | Disposition: A | Discharge status: Home / Self Care | Payer: Medicare Other | Source: Ambulatory Visit | Attending: Cardiology | Admitting: Cardiology

## 2012-07-12 ENCOUNTER — Encounter (HOSPITAL_COMMUNITY)
Admission: RE | Admit: 2012-07-12 | Discharge: 2012-07-12 | Disposition: A | Discharge status: Home / Self Care | Payer: Medicare Other | Source: Ambulatory Visit | Attending: Cardiology | Admitting: Cardiology

## 2012-07-15 ENCOUNTER — Encounter (HOSPITAL_COMMUNITY)
Admission: RE | Admit: 2012-07-15 | Discharge: 2012-07-15 | Disposition: A | Discharge status: Home / Self Care | Payer: Medicare Other | Source: Ambulatory Visit | Attending: Cardiology | Admitting: Cardiology

## 2012-07-17 ENCOUNTER — Encounter (HOSPITAL_COMMUNITY)
Admission: RE | Admit: 2012-07-17 | Discharge: 2012-07-17 | Disposition: A | Discharge status: Home / Self Care | Payer: Medicare Other | Source: Ambulatory Visit | Attending: Cardiology | Admitting: Cardiology

## 2012-07-19 ENCOUNTER — Encounter (HOSPITAL_COMMUNITY)
Admission: RE | Admit: 2012-07-19 | Discharge: 2012-07-19 | Disposition: A | Discharge status: Home / Self Care | Payer: Medicare Other | Source: Ambulatory Visit | Attending: Cardiology | Admitting: Cardiology

## 2012-07-22 ENCOUNTER — Encounter (HOSPITAL_COMMUNITY): Payer: Medicare Other

## 2012-07-24 ENCOUNTER — Encounter (HOSPITAL_COMMUNITY): Payer: Medicare Other

## 2012-07-26 ENCOUNTER — Encounter (HOSPITAL_COMMUNITY): Payer: Medicare Other

## 2012-07-29 ENCOUNTER — Encounter (HOSPITAL_COMMUNITY)
Admission: RE | Admit: 2012-07-29 | Discharge: 2012-07-29 | Disposition: A | Discharge status: Home / Self Care | Payer: Medicare Other | Source: Ambulatory Visit | Attending: Cardiology | Admitting: Cardiology

## 2012-07-31 ENCOUNTER — Encounter (HOSPITAL_COMMUNITY)
Admission: RE | Admit: 2012-07-31 | Discharge: 2012-07-31 | Disposition: A | Discharge status: Home / Self Care | Payer: Medicare Other | Source: Ambulatory Visit | Attending: Cardiology | Admitting: Cardiology

## 2012-08-02 ENCOUNTER — Encounter (HOSPITAL_COMMUNITY)
Admission: RE | Admit: 2012-08-02 | Discharge: 2012-08-02 | Disposition: A | Discharge status: Home / Self Care | Payer: Medicare Other | Source: Ambulatory Visit | Attending: Cardiology | Admitting: Cardiology

## 2012-08-02 DIAGNOSIS — Z5189 Encounter for other specified aftercare: Secondary | ICD-10-CM | POA: Insufficient documentation

## 2012-08-02 DIAGNOSIS — E119 Type 2 diabetes mellitus without complications: Secondary | ICD-10-CM | POA: Insufficient documentation

## 2012-08-02 DIAGNOSIS — I10 Essential (primary) hypertension: Secondary | ICD-10-CM | POA: Insufficient documentation

## 2012-08-02 DIAGNOSIS — I2510 Atherosclerotic heart disease of native coronary artery without angina pectoris: Secondary | ICD-10-CM | POA: Insufficient documentation

## 2012-08-05 ENCOUNTER — Encounter (HOSPITAL_COMMUNITY): Payer: Medicare Other

## 2012-08-07 ENCOUNTER — Encounter (HOSPITAL_COMMUNITY)
Admission: RE | Admit: 2012-08-07 | Discharge: 2012-08-07 | Disposition: A | Discharge status: Home / Self Care | Payer: Medicare Other | Source: Ambulatory Visit | Attending: Cardiology | Admitting: Cardiology

## 2012-08-09 ENCOUNTER — Encounter (HOSPITAL_COMMUNITY)
Admission: RE | Admit: 2012-08-09 | Discharge: 2012-08-09 | Disposition: A | Discharge status: Home / Self Care | Payer: Medicare Other | Source: Ambulatory Visit | Attending: Cardiology | Admitting: Cardiology

## 2012-08-12 ENCOUNTER — Encounter (HOSPITAL_COMMUNITY)
Admission: RE | Admit: 2012-08-12 | Discharge: 2012-08-12 | Disposition: A | Discharge status: Home / Self Care | Payer: Medicare Other | Source: Ambulatory Visit | Attending: Cardiology | Admitting: Cardiology

## 2012-08-14 ENCOUNTER — Encounter (HOSPITAL_COMMUNITY)
Admission: RE | Admit: 2012-08-14 | Discharge: 2012-08-14 | Disposition: A | Discharge status: Home / Self Care | Payer: Medicare Other | Source: Ambulatory Visit | Attending: Cardiology | Admitting: Cardiology

## 2012-08-16 ENCOUNTER — Encounter (HOSPITAL_COMMUNITY)
Admission: RE | Admit: 2012-08-16 | Discharge: 2012-08-16 | Disposition: A | Discharge status: Home / Self Care | Payer: Medicare Other | Source: Ambulatory Visit | Attending: Cardiology | Admitting: Cardiology

## 2012-08-19 ENCOUNTER — Encounter (HOSPITAL_COMMUNITY): Payer: Medicare Other

## 2012-08-21 ENCOUNTER — Encounter (HOSPITAL_COMMUNITY)
Admission: RE | Admit: 2012-08-21 | Discharge: 2012-08-21 | Disposition: A | Discharge status: Home / Self Care | Payer: Medicare Other | Source: Ambulatory Visit | Attending: Cardiology | Admitting: Cardiology

## 2012-08-23 ENCOUNTER — Encounter (HOSPITAL_COMMUNITY)
Admission: RE | Admit: 2012-08-23 | Discharge: 2012-08-23 | Disposition: A | Discharge status: Home / Self Care | Payer: Medicare Other | Source: Ambulatory Visit | Attending: Cardiology | Admitting: Cardiology

## 2012-08-26 ENCOUNTER — Encounter (HOSPITAL_COMMUNITY)
Admission: RE | Admit: 2012-08-26 | Discharge: 2012-08-26 | Disposition: A | Discharge status: Home / Self Care | Payer: Medicare Other | Source: Ambulatory Visit | Attending: Cardiology | Admitting: Cardiology

## 2012-08-28 ENCOUNTER — Encounter (HOSPITAL_COMMUNITY)
Admission: RE | Admit: 2012-08-28 | Discharge: 2012-08-28 | Disposition: A | Discharge status: Home / Self Care | Payer: Medicare Other | Source: Ambulatory Visit | Attending: Cardiology | Admitting: Cardiology

## 2012-08-30 ENCOUNTER — Encounter (HOSPITAL_COMMUNITY): Payer: Medicare Other

## 2012-09-02 ENCOUNTER — Encounter (HOSPITAL_COMMUNITY)
Admission: RE | Admit: 2012-09-02 | Discharge: 2012-09-02 | Disposition: A | Discharge status: Home / Self Care | Payer: Medicare Other | Source: Ambulatory Visit | Attending: Cardiology | Admitting: Cardiology

## 2012-09-02 DIAGNOSIS — I10 Essential (primary) hypertension: Secondary | ICD-10-CM | POA: Insufficient documentation

## 2012-09-02 DIAGNOSIS — E119 Type 2 diabetes mellitus without complications: Secondary | ICD-10-CM | POA: Insufficient documentation

## 2012-09-02 DIAGNOSIS — I2510 Atherosclerotic heart disease of native coronary artery without angina pectoris: Secondary | ICD-10-CM | POA: Insufficient documentation

## 2012-09-02 DIAGNOSIS — Z5189 Encounter for other specified aftercare: Secondary | ICD-10-CM | POA: Insufficient documentation

## 2012-09-04 ENCOUNTER — Encounter (HOSPITAL_COMMUNITY): Payer: Medicare Other

## 2012-09-06 ENCOUNTER — Other Ambulatory Visit: Payer: Self-pay | Admitting: Cardiology

## 2012-09-06 ENCOUNTER — Encounter (HOSPITAL_COMMUNITY)
Admission: RE | Admit: 2012-09-06 | Discharge: 2012-09-06 | Disposition: A | Discharge status: Home / Self Care | Payer: Medicare Other | Source: Ambulatory Visit | Attending: Cardiology | Admitting: Cardiology

## 2012-09-06 MED ORDER — VERAPAMIL HCL ER 240 MG PO TBCR: 240.0000 mg | EXTENDED_RELEASE_TABLET | Freq: Every day | ORAL | Status: DC

## 2012-09-09 ENCOUNTER — Encounter (HOSPITAL_COMMUNITY): Payer: Medicare Other

## 2012-09-11 ENCOUNTER — Encounter (HOSPITAL_COMMUNITY)
Admission: RE | Admit: 2012-09-11 | Discharge: 2012-09-11 | Disposition: A | Discharge status: Home / Self Care | Payer: Medicare Other | Source: Ambulatory Visit | Attending: Cardiology | Admitting: Cardiology

## 2012-09-13 ENCOUNTER — Encounter (HOSPITAL_COMMUNITY)
Admission: RE | Admit: 2012-09-13 | Discharge: 2012-09-13 | Disposition: A | Discharge status: Home / Self Care | Payer: Medicare Other | Source: Ambulatory Visit | Attending: Cardiology | Admitting: Cardiology

## 2012-09-16 ENCOUNTER — Encounter (HOSPITAL_COMMUNITY): Payer: Medicare Other

## 2012-09-18 ENCOUNTER — Encounter (HOSPITAL_COMMUNITY)
Admission: RE | Admit: 2012-09-18 | Discharge: 2012-09-18 | Disposition: A | Discharge status: Home / Self Care | Payer: Medicare Other | Source: Ambulatory Visit | Attending: Cardiology | Admitting: Cardiology

## 2012-09-20 ENCOUNTER — Encounter (HOSPITAL_COMMUNITY)
Admission: RE | Admit: 2012-09-20 | Discharge: 2012-09-20 | Disposition: A | Discharge status: Home / Self Care | Payer: Medicare Other | Source: Ambulatory Visit | Attending: Cardiology | Admitting: Cardiology

## 2012-09-23 ENCOUNTER — Encounter (HOSPITAL_COMMUNITY)
Admission: RE | Admit: 2012-09-23 | Discharge: 2012-09-23 | Disposition: A | Discharge status: Home / Self Care | Payer: Medicare Other | Source: Ambulatory Visit | Attending: Cardiology | Admitting: Cardiology

## 2012-09-27 ENCOUNTER — Encounter (HOSPITAL_COMMUNITY): Payer: Medicare Other

## 2012-09-27 LAB — HM DIABETES EYE EXAM

## 2012-09-30 ENCOUNTER — Encounter (HOSPITAL_COMMUNITY)
Admission: RE | Admit: 2012-09-30 | Discharge: 2012-09-30 | Disposition: A | Discharge status: Home / Self Care | Payer: Medicare Other | Source: Ambulatory Visit | Attending: Cardiology | Admitting: Cardiology

## 2012-10-02 ENCOUNTER — Encounter (HOSPITAL_COMMUNITY): Payer: Medicare Other

## 2012-10-04 ENCOUNTER — Encounter (HOSPITAL_COMMUNITY): Payer: Medicare Other

## 2012-10-07 ENCOUNTER — Encounter (HOSPITAL_COMMUNITY)
Admission: RE | Admit: 2012-10-07 | Discharge: 2012-10-07 | Disposition: A | Discharge status: Home / Self Care | Payer: Medicare Other | Source: Ambulatory Visit | Attending: Cardiology | Admitting: Cardiology

## 2012-10-07 DIAGNOSIS — I10 Essential (primary) hypertension: Secondary | ICD-10-CM | POA: Insufficient documentation

## 2012-10-07 DIAGNOSIS — Z5189 Encounter for other specified aftercare: Secondary | ICD-10-CM | POA: Insufficient documentation

## 2012-10-07 DIAGNOSIS — I2510 Atherosclerotic heart disease of native coronary artery without angina pectoris: Secondary | ICD-10-CM | POA: Insufficient documentation

## 2012-10-07 DIAGNOSIS — E119 Type 2 diabetes mellitus without complications: Secondary | ICD-10-CM | POA: Insufficient documentation

## 2012-10-09 ENCOUNTER — Encounter (HOSPITAL_COMMUNITY): Payer: Medicare Other

## 2012-10-11 ENCOUNTER — Encounter (HOSPITAL_COMMUNITY)
Admission: RE | Admit: 2012-10-11 | Discharge: 2012-10-11 | Disposition: A | Discharge status: Home / Self Care | Payer: Medicare Other | Source: Ambulatory Visit | Attending: Cardiology | Admitting: Cardiology

## 2012-10-14 ENCOUNTER — Encounter (HOSPITAL_COMMUNITY): Payer: Medicare Other

## 2012-10-16 ENCOUNTER — Encounter (HOSPITAL_COMMUNITY)
Admission: RE | Admit: 2012-10-16 | Discharge: 2012-10-16 | Disposition: A | Discharge status: Home / Self Care | Payer: Medicare Other | Source: Ambulatory Visit | Attending: Cardiology | Admitting: Cardiology

## 2012-10-18 ENCOUNTER — Encounter (HOSPITAL_COMMUNITY)
Admission: RE | Admit: 2012-10-18 | Discharge: 2012-10-18 | Disposition: A | Discharge status: Home / Self Care | Payer: Medicare Other | Source: Ambulatory Visit | Attending: Cardiology | Admitting: Cardiology

## 2012-10-21 ENCOUNTER — Encounter (HOSPITAL_COMMUNITY)
Admission: RE | Admit: 2012-10-21 | Discharge: 2012-10-21 | Disposition: A | Discharge status: Home / Self Care | Payer: Medicare Other | Source: Ambulatory Visit | Attending: Cardiology | Admitting: Cardiology

## 2012-10-21 NOTE — Progress Notes (Signed)
Cardiac Rehabilitation Program Outcomes Report   Orientation:  06/27/2012 !st week report: 07/05/2012 Graduate Date:  tbd Discharge Date:  tbd # of sessions completed: 3 DX: Stent X 1  Cardiologist: Jacinto Halim Family MD:  Benedetto Goad Time:  06:45  A.  Exercise Program:  Tolerates exercise @ 4.08 METS for 15 minutes and Walk Test Results:  Pre: 6 minute walk test Resting, HR 52, BP 138/80, O2 98%, RPE 6, and RPD 6, 6 minute HR  85, BP 158/90, O2 95%, PRE 11, and RPD11, Post HR 57, BP, 160/82, O2 98%, RPE 6, RPD 8,  walked 1,150 ft and 2.2 mph.  B.  Mental Health:  Good mental attitude  C.  Education/Instruction/Skills  Accurately checks own pulse.  Rest:  70  Exercise: 108, Knows THR for exercise and Uses Perceived Exertion Scale and/or Dyspnea Scale  Uses Perceived Exertion Scale and/or Dyspnea Scale  D.  Nutrition/Weight Control/Body Composition:  Adherence to prescribed nutrition program: good    E.  Blood Lipids    No results found for this basename: CHOL, HDL, LDLCALC, LDLDIRECT, TRIG, CHOLHDL    F.  Lifestyle Changes:  Making positive lifestyle changes  G.  Symptoms noted with exercise:  Asymptomatic  Report Completed By:  Lelon Huh. Octivia Canion RN   Comments:  This is patient's 1st week report. He achieved a peak METS of 4.08. His resting Hr was 70 and BP was 140/80 and peak  HR was 108 and peak BP 168/80. A report will follow on his 18th visit, his halfway report.

## 2012-10-23 ENCOUNTER — Encounter (HOSPITAL_COMMUNITY): Payer: Medicare Other

## 2012-10-25 ENCOUNTER — Encounter (HOSPITAL_COMMUNITY)
Admission: RE | Admit: 2012-10-25 | Discharge: 2012-10-25 | Disposition: A | Discharge status: Home / Self Care | Payer: Medicare Other | Source: Ambulatory Visit | Attending: Cardiology | Admitting: Cardiology

## 2012-10-28 ENCOUNTER — Encounter (HOSPITAL_COMMUNITY): Payer: Medicare Other

## 2012-10-28 NOTE — Progress Notes (Signed)
Cardiac Rehabilitation Program Outcomes Report   Orientation:  06/27/2012 Halfway report:08/21/2012 Graduate Date:  tbd Discharge Date:  tbd # of sessions completed: 18 DX: Stent X 1  Cardiologist: Jacinto Halim Family MD:  Benedetto Goad Time:  06:45  A.  Exercise Program:  Tolerates exercise @ 4.08 METS for 15 minutes  B.  Mental Health:  Good mental attitude  C.  Education/Instruction/Skills  Accurately checks own pulse.  Rest:  71:Exercise:  111, Knows THR for exercise and Uses Perceived Exertion Scale and/or Dyspnea Scale  Uses Perceived Exertion Scale and/or Dyspnea Scale  D.  Nutrition/Weight Control/Body Composition:  Adherence to prescribed nutrition program: good    E.  Blood Lipids    No results found for this basename: CHOL, HDL, LDLCALC, LDLDIRECT, TRIG, CHOLHDL    F.  Lifestyle Changes:  Making positive lifestyle changes  G.  Symptoms noted with exercise:  Asymptomatic  Report Completed By:  Lelon Huh. Micheline Markes RN   Comments:  This is patients halfway report. He achieved a peak METS of 4.08. His resting HR was 71 and resting BP was 150/80 and his peak HR was 111 and peak BP was 160/90. A graduation report will follow on his 36 visit.

## 2012-10-30 ENCOUNTER — Encounter (HOSPITAL_COMMUNITY): Payer: Medicare Other

## 2012-11-01 ENCOUNTER — Encounter (HOSPITAL_COMMUNITY): Payer: Medicare Other

## 2012-11-04 ENCOUNTER — Encounter (HOSPITAL_COMMUNITY): Payer: Medicare Other

## 2012-11-06 ENCOUNTER — Encounter (HOSPITAL_COMMUNITY): Payer: Medicare Other

## 2012-11-08 ENCOUNTER — Encounter (HOSPITAL_COMMUNITY)
Admission: RE | Admit: 2012-11-08 | Discharge: 2012-11-08 | Disposition: A | Discharge status: Home / Self Care | Payer: Medicare Other | Source: Ambulatory Visit | Attending: Cardiology | Admitting: Cardiology

## 2012-11-08 DIAGNOSIS — I10 Essential (primary) hypertension: Secondary | ICD-10-CM | POA: Insufficient documentation

## 2012-11-08 DIAGNOSIS — E119 Type 2 diabetes mellitus without complications: Secondary | ICD-10-CM | POA: Insufficient documentation

## 2012-11-08 DIAGNOSIS — I2510 Atherosclerotic heart disease of native coronary artery without angina pectoris: Secondary | ICD-10-CM | POA: Insufficient documentation

## 2012-11-08 DIAGNOSIS — Z5189 Encounter for other specified aftercare: Secondary | ICD-10-CM | POA: Insufficient documentation

## 2012-11-11 ENCOUNTER — Encounter (HOSPITAL_COMMUNITY): Payer: Medicare Other

## 2013-03-28 NOTE — Progress Notes (Signed)
Cardiac Rehabilitation Program Outcomes Report   Orientation:  06/27/2012 Graduate Date:  11/08/2012 Discharge Date:  11/08/2012 # of sessions completed: 36 DX: StentX1  Cardiologist: Carmelina Noun MD:  Benedetto Goad Time:  08:15  A.  Exercise Program:  Tolerates exercise @ 4.08 METS for 15 minutes and Walk Test Results:  Post: Post walk test resting HR 58, BP 138/70, O2 97%, RPE 6 and RPD 6, 6 min HR 88, BP 170/92, O2 93, RPE 11 and RPD 11, Post HR 63, BP 132/90, O2 97%, RPE 9 and RPD 9 .  B.  Mental Health:  Good mental attitude  C.  Education/Instruction/Skills  Accurately checks own pulse.  Rest:  58  Exercise:94, Knows THR for exercise, Uses Perceived Exertion Scale and/or Dyspnea Scale and Attended 12 education classes  Uses Perceived Exertion Scale and/or Dyspnea Scale  D.  Nutrition/Weight Control/Body Composition:  Adherence to prescribed nutrition program: good    E.  Blood Lipids    No results found for this basename: CHOL, HDL, LDLCALC, LDLDIRECT, TRIG, CHOLHDL    F.  Lifestyle Changes:  Making positive lifestyle changes  G.  Symptoms noted with exercise:  Asymptomatic  Report Completed By:  Lelon Huh. Maxi Rodas RN   Comments:  This is patients Graduation Report. HE achieved a peak METS of 4.08. His resting HR is 58 and resting BP is 138/70, His peak HR is 94 and peak BP was 148/80. He has done well while in rehab.

## 2013-05-12 ENCOUNTER — Other Ambulatory Visit: Payer: Self-pay | Admitting: *Deleted

## 2013-05-12 MED ORDER — VERAPAMIL HCL ER 240 MG PO TBCR: 240.0000 mg | EXTENDED_RELEASE_TABLET | Freq: Every day | ORAL | Status: DC

## 2013-06-13 ENCOUNTER — Other Ambulatory Visit (HOSPITAL_COMMUNITY): Payer: Self-pay | Admitting: Cardiology

## 2013-06-13 DIAGNOSIS — R0989 Other specified symptoms and signs involving the circulatory and respiratory systems: Secondary | ICD-10-CM

## 2013-06-19 ENCOUNTER — Ambulatory Visit (HOSPITAL_COMMUNITY)
Admission: RE | Admit: 2013-06-19 | Discharge: 2013-06-19 | Disposition: A | Discharge status: Home / Self Care | Payer: Medicare Other | Source: Ambulatory Visit | Attending: Cardiology | Admitting: Cardiology

## 2013-06-19 DIAGNOSIS — R0989 Other specified symptoms and signs involving the circulatory and respiratory systems: Secondary | ICD-10-CM | POA: Insufficient documentation

## 2013-06-20 ENCOUNTER — Other Ambulatory Visit (HOSPITAL_COMMUNITY): Payer: Medicare Other

## 2013-09-01 ENCOUNTER — Telehealth: Payer: Self-pay | Admitting: Cardiology

## 2013-09-01 NOTE — Telephone Encounter (Signed)
Noted pt is overdue for apt, left message for pt to call the office to schedule apt

## 2013-09-01 NOTE — Telephone Encounter (Signed)
Received fax refill request  Rx #  Medication:  Verapamil ER tab 240 mg / 12 HR Qty 90 Sig:  Take one by mouth at bedtime Physician:  Dietrich Pates

## 2013-09-03 ENCOUNTER — Telehealth: Payer: Self-pay | Admitting: Cardiology

## 2013-09-03 NOTE — Telephone Encounter (Signed)
Received fax refill request  Rx #  Medication:  Verapamil ER TAB 240 mg / 12 HR Qty 90 Sig:  Take one by mouth at bedtime Physician:  Dietrich Pates

## 2014-07-03 ENCOUNTER — Institutional Professional Consult (permissible substitution): Payer: Medicare Other | Admitting: Neurology

## 2014-07-06 ENCOUNTER — Telehealth: Payer: Self-pay | Admitting: Orthopedic Surgery

## 2014-07-06 NOTE — Telephone Encounter (Signed)
[name] is calling requesting Omar 2nd opinion with Dr. Aline Brochure for Rt pinky trigger finger. He had surgery in 2010 by Dr. Apolonio Schneiders at [facility]. I advised [name] to please get those records and x-rays and bring them by to let Dr. Aline Brochure review those before actually scheduling appointment and [name] understood

## 2014-08-17 ENCOUNTER — Ambulatory Visit (INDEPENDENT_AMBULATORY_CARE_PROVIDER_SITE_OTHER): Payer: Medicare Other | Admitting: Orthopedic Surgery

## 2014-08-17 ENCOUNTER — Encounter: Payer: Self-pay | Admitting: Orthopedic Surgery

## 2014-08-17 VITALS — BP 147/73 | Ht 68.0 in | Wt 312.0 lb

## 2014-08-17 DIAGNOSIS — M720 Palmar fascial fibromatosis [Dupuytren]: Secondary | ICD-10-CM

## 2014-08-17 NOTE — Patient Instructions (Signed)
Will refer to [facility], Dr Caralyn Guile

## 2014-08-17 NOTE — Progress Notes (Signed)
Patient ID: Omar [name], male   DOB: Nov 06, 1945, 68 y.o.   MRN: [id number]  Chief Complaint  Patient presents with  . Hand Pain    Right pinky trigger finger, 2nd opinion.     HPI AMONTE [name] is a 68 y.o. male. 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 Apolonio Schneiders approximately 3 years ago with fairly good results until recently when the hand developed increasing contracture involving the small finger.  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 He would like to surgery redone if possible.  HPI  Past Medical History  Diagnosis Date  . 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 BPH (benign prostatic hyperplasia)   . Degenerative joint disease   . Meningioma     Left frontal; CVA identified by MRI; no neurologic symptoms  . Hyperlipidemia   . Obesity   . Hepatic steatosis   . Abnormal EKG     Inferior Q waves  . Coronary artery disease   . Heart murmur   . Sleep apnea 2008    Dr. Merlene Laughter interpreted the study-CPAP recommended, but refused by patient  . Diabetes mellitus, type 2     No insulin  . Stroke     Past Surgical History  Procedure Laterality Date  . Tonsillectomy    . Ventral hernia repair  2010    umb hernia-AP-went home same day  . Colonoscopy w/ polypectomy  05/2008    polypectomy x4-adenomatous; internal hemorrhoids  . Dupuytren / palmar fasciotomy  2009    rt hand-dsc-went home same day  . Dupuytren contracture release  02/21/2012    Procedure: DUPUYTREN CONTRACTURE RELEASE;  Surgeon: Linna Hoff, MD;  Location: [place];  Service: Orthopedics;  Laterality: Right;  partial palmar and digital fasciectomy and joint release  . Coronary angioplasty with stent placement  06/04/2012      DES    to [place]  . Coronary stent placement      Family History   Problem Relation Age of Onset  . Cancer Mother   . Cancer Father   . Coronary artery disease Neg Hx     Social History History  Substance Use Topics  . Smoking status: Never Smoker   . Smokeless tobacco: Never Used  . Alcohol Use: No    No Known Allergies  Current Outpatient Prescriptions  Medication Sig Dispense Refill  . aspirin 81 MG tablet Take 81 mg by mouth daily.    Marland Kitchen glyBURIDE-metformin (GLUCOVANCE) 5-500 MG per tablet Take 2 tablets by mouth 2 (two) times daily with a meal.     . lisinopril-hydrochlorothiazide (PRINZIDE,ZESTORETIC) 20-25 MG per tablet Take 1 tablet by mouth daily.    . nebivolol (BYSTOLIC) 10 MG tablet Take 20 mg by mouth daily.    . potassium chloride SA (K-DUR,KLOR-CON) 20 MEQ tablet Take 20 mEq by mouth daily.    . pravastatin (PRAVACHOL) 40 MG tablet Take 40 mg by mouth daily.    . saxagliptin HCl (ONGLYZA) 5 MG TABS tablet Take 5 mg by mouth daily.    . Tamsulosin HCl (FLOMAX) 0.4 MG CAPS Take 0.4 mg by mouth daily after breakfast.    . Ticagrelor (BRILINTA) 90 MG TABS tablet Take 1 tablet (90 mg total) by mouth 2 (two) times daily. 60 tablet 0  .  verapamil (CALAN-SR) 240 MG CR tablet Take 1 tablet (240 mg total) by mouth at bedtime. 90 tablet 0   No current facility-administered medications for this visit.    Review of Systems Review of Systems There is no numbness or tingling related to this. He did have a stent placed a few years ago and has been stable with recent cardiology visit to be cleared for repeat surgery. Blood pressure 147/73, height 5\' 8"  (1.727 m), weight 312 lb (141.522 kg).  Physical Exam Physical Exam BP 147/73 mmHg  Ht 5\' 8"  (1.727 m)  Wt 312 lb (141.522 kg)  BMI 47.45 kg/m2 Mood and affect are pleasant is oriented 3. His right hand has 2 incisions over the ring finger and small finger from previous Dupuytren's release his small fingers contracted and firmly flexed at the IP joints. Color is good capillary refill is  excellent sensation remains normal. His opposite hand is developing contractures with no flexion deformities.   Data Reviewed   Assessment    Dupuytren's contracture recurrent     Plan    Referral to hand specialist for treatment        Arther Abbott 08/17/2014, 3:23 PM

## 2014-08-21 ENCOUNTER — Telehealth: Payer: Self-pay | Admitting: *Deleted

## 2014-08-21 ENCOUNTER — Other Ambulatory Visit: Payer: Self-pay | Admitting: *Deleted

## 2014-08-21 DIAGNOSIS — M720 Palmar fascial fibromatosis [Dupuytren]: Secondary | ICD-10-CM

## 2014-08-21 NOTE — Telephone Encounter (Signed)
REFERRAL SENT TO DR Caralyn Guile

## 2014-09-07 NOTE — Telephone Encounter (Signed)
Per fax from [facility], unable to reach patient after multiple attempts

## 2014-09-09 ENCOUNTER — Other Ambulatory Visit (HOSPITAL_COMMUNITY): Payer: Self-pay | Admitting: Pulmonary Disease

## 2014-09-09 ENCOUNTER — Ambulatory Visit (HOSPITAL_COMMUNITY)
Admission: RE | Admit: 2014-09-09 | Discharge: 2014-09-09 | Disposition: A | Discharge status: Home / Self Care | Payer: Medicare Other | Source: Ambulatory Visit | Attending: Pulmonary Disease | Admitting: Pulmonary Disease

## 2014-09-09 DIAGNOSIS — I517 Cardiomegaly: Secondary | ICD-10-CM | POA: Insufficient documentation

## 2014-09-09 DIAGNOSIS — R0602 Shortness of breath: Secondary | ICD-10-CM | POA: Diagnosis present

## 2014-09-10 ENCOUNTER — Encounter (HOSPITAL_COMMUNITY): Payer: Self-pay | Admitting: Cardiology

## 2014-09-22 ENCOUNTER — Institutional Professional Consult (permissible substitution): Payer: Medicare Other | Admitting: Neurology

## 2014-10-07 NOTE — Telephone Encounter (Signed)
Appt Oct 08 2014 @ 8:15am w/ Dr Apolonio Schneiders

## 2014-10-15 ENCOUNTER — Encounter (HOSPITAL_BASED_OUTPATIENT_CLINIC_OR_DEPARTMENT_OTHER): Payer: Self-pay | Admitting: Orthopedic Surgery

## 2014-10-22 ENCOUNTER — Institutional Professional Consult (permissible substitution): Payer: Medicare Other | Admitting: Neurology

## 2014-11-02 ENCOUNTER — Other Ambulatory Visit: Payer: Self-pay | Admitting: Orthopedic Surgery

## 2014-12-04 ENCOUNTER — Encounter: Payer: PPO | Admitting: Nutrition

## 2015-01-21 ENCOUNTER — Ambulatory Visit: Payer: Self-pay | Admitting: Nutrition

## 2015-03-17 LAB — HEMOGLOBIN A1C: HEMOGLOBIN A1C: 7.7 % — AB (ref 4.0–6.0)

## 2015-05-07 ENCOUNTER — Telehealth (HOSPITAL_BASED_OUTPATIENT_CLINIC_OR_DEPARTMENT_OTHER): Payer: Self-pay

## 2015-05-07 NOTE — Telephone Encounter (Signed)
Called pt to see if he wanted to schedule an appt with dietician. He said he needs to wait because he has vacation scheduled but not sure of dates so will call back after vacation.

## 2015-06-17 ENCOUNTER — Ambulatory Visit (HOSPITAL_COMMUNITY)
Admission: RE | Admit: 2015-06-17 | Discharge: 2015-06-17 | Disposition: A | Discharge status: Home / Self Care | Payer: PPO | Source: Ambulatory Visit | Attending: Pulmonary Disease | Admitting: Pulmonary Disease

## 2015-06-17 ENCOUNTER — Other Ambulatory Visit (HOSPITAL_COMMUNITY): Payer: Self-pay | Admitting: Pulmonary Disease

## 2015-06-17 ENCOUNTER — Other Ambulatory Visit (HOSPITAL_COMMUNITY)
Admission: RE | Admit: 2015-06-17 | Discharge: 2015-06-17 | Disposition: A | Discharge status: Home / Self Care | Payer: PPO | Source: Ambulatory Visit | Attending: Pulmonary Disease | Admitting: Pulmonary Disease

## 2015-06-17 DIAGNOSIS — R609 Edema, unspecified: Secondary | ICD-10-CM | POA: Diagnosis not present

## 2015-06-17 DIAGNOSIS — I10 Essential (primary) hypertension: Secondary | ICD-10-CM | POA: Diagnosis present

## 2015-06-17 DIAGNOSIS — M79604 Pain in right leg: Secondary | ICD-10-CM

## 2015-06-17 DIAGNOSIS — M79605 Pain in left leg: Secondary | ICD-10-CM | POA: Insufficient documentation

## 2015-06-17 DIAGNOSIS — E1165 Type 2 diabetes mellitus with hyperglycemia: Secondary | ICD-10-CM | POA: Insufficient documentation

## 2015-06-17 LAB — COMPREHENSIVE METABOLIC PANEL
ALK PHOS: 69 U/L (ref 38–126)
ALT: 22 U/L (ref 17–63)
ANION GAP: 8 (ref 5–15)
AST: 23 U/L (ref 15–41)
Albumin: 3.8 g/dL (ref 3.5–5.0)
BILIRUBIN TOTAL: 0.6 mg/dL (ref 0.3–1.2)
BUN: 14 mg/dL (ref 6–20)
CALCIUM: 8.9 mg/dL (ref 8.9–10.3)
CO2: 28 mmol/L (ref 22–32)
Chloride: 100 mmol/L — ABNORMAL LOW (ref 101–111)
Creatinine, Ser: 0.72 mg/dL (ref 0.61–1.24)
GFR calc non Af Amer: >60 mL/min (ref >60)
Glucose, Bld: 237 mg/dL — ABNORMAL HIGH (ref 65–99)
Potassium: 3.6 mmol/L (ref 3.5–5.1)
Sodium: 136 mmol/L (ref 135–145)
TOTAL PROTEIN: 6.8 g/dL (ref 6.5–8.1)

## 2015-06-17 LAB — CBC
HEMATOCRIT: 44 % (ref 39.0–52.0)
HEMOGLOBIN: 15.2 g/dL (ref 13.0–17.0)
MCH: 30.2 pg (ref 26.0–34.0)
MCHC: 34.5 g/dL (ref 30.0–36.0)
MCV: 87.5 fL (ref 78.0–100.0)
Platelets: 282 10*3/uL (ref 150–400)
RBC: 5.03 MIL/uL (ref 4.22–5.81)
RDW: 14 % (ref 11.5–15.5)
WBC: 9.8 10*3/uL (ref 4.0–10.5)

## 2015-06-17 LAB — BRAIN NATRIURETIC PEPTIDE: B NATRIURETIC PEPTIDE 5: 125 pg/mL — AB (ref 0.0–100.0)

## 2015-06-18 ENCOUNTER — Encounter: Payer: Self-pay | Admitting: "Endocrinology

## 2015-06-18 LAB — HEMOGLOBIN A1C
HEMOGLOBIN A1C: 8.3 % — AB (ref 4.8–5.6)
MEAN PLASMA GLUCOSE: 192 mg/dL

## 2015-07-02 ENCOUNTER — Ambulatory Visit (INDEPENDENT_AMBULATORY_CARE_PROVIDER_SITE_OTHER): Payer: PPO | Admitting: "Endocrinology

## 2015-07-02 ENCOUNTER — Encounter: Payer: Self-pay | Admitting: "Endocrinology

## 2015-07-02 VITALS — BP 152/86 | HR 82 | Ht 66.5 in | Wt 276.0 lb

## 2015-07-02 DIAGNOSIS — E785 Hyperlipidemia, unspecified: Secondary | ICD-10-CM

## 2015-07-02 DIAGNOSIS — I82409 Acute embolism and thrombosis of unspecified deep veins of unspecified lower extremity: Secondary | ICD-10-CM

## 2015-07-02 DIAGNOSIS — I82402 Acute embolism and thrombosis of unspecified deep veins of left lower extremity: Secondary | ICD-10-CM

## 2015-07-02 DIAGNOSIS — E669 Obesity, unspecified: Secondary | ICD-10-CM

## 2015-07-02 DIAGNOSIS — E1159 Type 2 diabetes mellitus with other circulatory complications: Secondary | ICD-10-CM

## 2015-07-02 DIAGNOSIS — I10 Essential (primary) hypertension: Secondary | ICD-10-CM

## 2015-07-02 HISTORY — DX: Acute embolism and thrombosis of unspecified deep veins of unspecified lower extremity: I82.409

## 2015-07-02 MED ORDER — CANAGLIFLOZIN 100 MG PO TABS: 100.0000 mg | ORAL_TABLET | Freq: Every day | ORAL | Status: DC

## 2015-07-02 MED ORDER — GLUCOSE BLOOD VI STRP: ORAL_STRIP | Status: DC

## 2015-07-02 NOTE — Progress Notes (Signed)
Subjective:    Patient ID: Omar [name], male    DOB: April 25, 1946,    Past Medical History  Diagnosis Date  . 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 BPH (benign prostatic hyperplasia)   . Degenerative joint disease   . Meningioma     Left frontal; CVA identified by MRI; no neurologic symptoms  . Hyperlipidemia   . Obesity   . Hepatic steatosis   . Abnormal EKG     Inferior Q waves  . Coronary artery disease   . Heart murmur   . Sleep apnea 2008    Dr. Merlene Laughter interpreted the study-CPAP recommended, but refused by patient  . Diabetes mellitus, type 2     No insulin  . Stroke    Past Surgical History  Procedure Laterality Date  . Tonsillectomy    . Ventral hernia repair  2010    umb hernia-AP-went home same day  . Colonoscopy w/ polypectomy  05/2008    polypectomy x4-adenomatous; internal hemorrhoids  . Dupuytren / palmar fasciotomy  2009    rt hand-dsc-went home same day  . Coronary angioplasty with stent placement  06/04/2012      DES    to [place]  . Coronary stent placement    . Left heart catheterization with coronary angiogram N/A 05/28/2012    Procedure: LEFT HEART CATHETERIZATION WITH CORONARY ANGIOGRAM;  Surgeon: Laverda Page, MD;  Location: [facility] C[facility] LAB;  Service: Cardiovascular;  Laterality: N/A;  . Percutaneous coronary stent intervention (pci-s) N/A 06/04/2012    Procedure: PERCUTANEOUS CORONARY STENT INTERVENTION (PCI-S);  Surgeon: Laverda Page, MD;  Location: [facility] CATH LAB;  Service: Cardiovascular;  Laterality: N/A;   Social History   Social History  . Marital Status: Married    Spouse Name: N/A  . Number of Children: N/A  . Years of Education: N/A   Occupational History  . Manufacturing engineer   Social History Main Topics  . Smoking status: Never Smoker   . Smokeless tobacco: Never Used  . Alcohol Use: No  . Drug Use: No  . Sexual Activity:  Not Currently   Other Topics Concern  . None   Social History Narrative   Outpatient Encounter Prescriptions as of 07/02/2015  Medication Sig  . apixaban (ELIQUIS) 5 MG TABS tablet Take 5 mg by mouth 2 (two) times daily.  Marland Kitchen aspirin 81 MG tablet Take 81 mg by mouth daily.  . ergocalciferol (VITAMIN D2) 50000 UNITS capsule Take 50,000 Units by mouth once a week.  . gabapentin (NEURONTIN) 100 MG capsule Take 100 mg by mouth 2 (two) times daily.  Marland Kitchen lisinopril-hydrochlorothiazide (PRINZIDE,ZESTORETIC) 20-25 MG per tablet Take 1 tablet by mouth daily.  . metFORMIN (GLUCOPHAGE) 500 MG tablet Take by mouth 2 (two) times daily with a meal.  . nebivolol (BYSTOLIC) 10 MG tablet Take 20 mg by mouth daily.  . nitroGLYCERIN (NITROSTAT) 0.4 MG SL tablet Place 0.4 mg under the tongue every 5 (five) minutes as needed for chest pain.  . potassium chloride SA (K-DUR,KLOR-CON) 20 MEQ tablet Take 20 mEq by mouth daily.  . pravastatin (PRAVACHOL) 40 MG tablet Take 40 mg by mouth daily.  . Tamsulosin HCl (FLOMAX) 0.4 MG CAPS Take 0.4 mg by mouth daily after breakfast.  . traZODone (DESYREL) 100 MG tablet Take 100 mg by mouth at bedtime.  . verapamil (CALAN-SR) 240 MG  CR tablet Take 1 tablet (240 mg total) by mouth at bedtime.  . canagliflozin (INVOKANA) 100 MG TABS tablet Take 1 tablet (100 mg total) by mouth daily.  Marland Kitchen glucose blood (ONETOUCH VERIO) test strip Use as instructed  . [DISCONTINUED] glyBURIDE-metformin (GLUCOVANCE) 5-500 MG per tablet Take 2 tablets by mouth 2 (two) times daily with a meal.   . [DISCONTINUED] saxagliptin HCl (ONGLYZA) 5 MG TABS tablet Take 5 mg by mouth daily.  . [DISCONTINUED] Ticagrelor (BRILINTA) 90 MG TABS tablet Take 1 tablet (90 mg total) by mouth 2 (two) times daily.   No facility-administered encounter medications on file as of 07/02/2015.   ALLERGIES: No Known Allergies VACCINATION STATUS:  There is no immunization history on file for this patient.  Diabetes He  presents for his follow-up diabetic visit. He has type 2 diabetes mellitus. The initial diagnosis of diabetes was made 16 years ago. His disease course has been worsening. There are no hypoglycemic associated symptoms. Pertinent negatives for hypoglycemia include no confusion, headaches, pallor or seizures. Associated symptoms include fatigue, polydipsia and polyphagia. Pertinent negatives for diabetes include no chest pain, no polyuria and no weakness. Diabetic complications include PVD. Risk factors for coronary artery disease include diabetes mellitus, dyslipidemia, obesity, male sex, hypertension and sedentary lifestyle. Current diabetic treatment includes oral agent (monotherapy). He is compliant with treatment some of the time. His weight is decreasing steadily. His home blood glucose trend is increasing steadily. His overall blood glucose range is 180-200 mg/dl.  Hypertension Pertinent negatives include no chest pain, headaches, neck pain, palpitations or shortness of breath. Hypertensive end-organ damage includes PVD.  Hyperlipidemia Pertinent negatives include no chest pain, myalgias or shortness of breath.   He was diagnosed with DVT of LLE since his last visit.  Review of Systems  Constitutional: Positive for fatigue. Negative for unexpected weight change.  HENT: Negative for dental problem, mouth sores and trouble swallowing.   Eyes: Negative for visual disturbance.  Respiratory: Negative for cough, choking, chest tightness, shortness of breath and wheezing.   Cardiovascular: Negative for chest pain, palpitations and leg swelling.  Gastrointestinal: Negative for nausea, vomiting, abdominal pain, diarrhea, constipation and abdominal distention.  Endocrine: Positive for polydipsia and polyphagia. Negative for polyuria.  Genitourinary: Negative for dysuria, urgency, hematuria and flank pain.  Musculoskeletal: Negative for myalgias, back pain, joint swelling, gait problem and neck pain.   Skin: Negative for pallor, rash and wound.  Neurological: Negative for seizures, syncope, weakness, numbness and headaches.  Psychiatric/Behavioral: Negative for suicidal ideas, hallucinations, confusion and dysphoric mood.    Objective:    BP 152/86 mmHg  Pulse 82  Ht 5' 6.5" (1.689 m)  Wt 276 lb (125.193 kg)  BMI 43.89 kg/m2  SpO2 98%  Wt Readings from Last 3 Encounters:  07/02/15 276 lb (125.193 kg)  03/26/15 284 lb (128.822 kg)  08/17/14 312 lb (141.522 kg)    Physical Exam  Constitutional: He is oriented to person, place, and time. He appears well-developed and well-nourished. He is cooperative.  HENT:  Head: Normocephalic and atraumatic.  Eyes: EOM are normal.  Neck: Normal range of motion. Neck supple. No tracheal deviation present. No thyromegaly present.  Cardiovascular: Normal rate and normal heart sounds.  Exam reveals no gallop.   No murmur heard. Pulses:      Dorsalis pedis pulses are 0 on the right side, and 0 on the left side.       Posterior tibial pulses are 0 on the right side, and 0 on  the left side.  Pulmonary/Chest: Breath sounds normal. No respiratory distress. He has no wheezes.  Abdominal: Soft. Bowel sounds are normal. He exhibits no distension. There is no hepatosplenomegaly. There is no tenderness. There is no guarding and no CVA tenderness.  Musculoskeletal: He exhibits no edema.       Right shoulder: He exhibits no swelling and no deformity.  Neurological: He is alert and oriented to person, place, and time. He has normal strength and normal reflexes. No cranial nerve deficit or sensory deficit. Gait normal.  Skin: Skin is warm and dry. No rash noted. No cyanosis. Nails show no clubbing.  Psychiatric: He has a normal mood and affect. His speech is normal and behavior is normal. Thought content normal. Cognition and memory are normal.    Results for orders placed or performed in visit on 06/18/15  Hemoglobin A1c  Result Value Ref Range   Hgb A1c  MFr Bld 7.7 (A) 4.0 - 6.0 %   Complete Blood Count (Most recent): Lab Results  Component Value Date   WBC 9.8 06/17/2015   HGB 15.2 06/17/2015   HCT 44.0 06/17/2015   MCV 87.5 06/17/2015   PLT 282 06/17/2015   Chemistry (most recent): Lab Results  Component Value Date   NA 136 06/17/2015   K 3.6 06/17/2015   CL 100* 06/17/2015   CO2 28 06/17/2015   BUN 14 06/17/2015   CREATININE 0.72 06/17/2015   Diabetic Labs (most recent): Lab Results  Component Value Date   HGBA1C 8.3* 06/17/2015   HGBA1C 7.7* 03/17/2015   HGBA1C 6.4* 06/04/2012   Lipid profile (most recent): No results found for: TRIG, CHOL       Assessment & Plan:   1. Type 2 diabetes mellitus with other circulatory complications  Patient is advised to stick to a routine mealtimes to eat 3 meals  a day and avoid unnecessary snacks ( to snack only to correct hypoglycemia). Patient is advised to eliminate simple carbs  from their diet including cakes desserts ice cream soda (  diet and regular) , sweet tea , Candies,  chips and cookies, artificial sweeteners,   and "sugar-free" products .  This will help patient to have stable blood glucose profile and potentially lose weight. Patient is given detailed personalized glucose monitoring and insulin dosing instructions. Patient is instructed to call back with extremes of blood glucose less than 70 or greater than 300. Patient to bring meter and  blood glucose logs during their next visit.  I advised him to increase Metformin to  1000mg  po BID, add  Invokana 100mg   PO qAM. He  Hesitates to  Go on insulin. He agrees to monitor BG AC and HS for 4 weeks .   2. Hyperlipidemia: I advised him to continue Pravastatin 40 mg po qhs.  3. Obesity He has lost 32 lbs. Encouraged to continue carbs restriction.  4. Essential hypertension Controlled. Continue current meds including ACEI.     Follow up plan: Return in about 4 weeks (around 07/30/2015) for diabetes, high blood  pressure, high cholesterol. With meter and logs for f/u.  Glade Lloyd, MD Phone: [phone]  Fax: [phone]   07/02/2015, 8:33 PM

## 2015-07-09 ENCOUNTER — Ambulatory Visit: Payer: PPO | Admitting: Nutrition

## 2015-07-22 ENCOUNTER — Other Ambulatory Visit: Payer: Self-pay | Admitting: "Endocrinology

## 2015-07-28 ENCOUNTER — Other Ambulatory Visit (HOSPITAL_COMMUNITY): Payer: Self-pay | Admitting: Pulmonary Disease

## 2015-07-28 DIAGNOSIS — I8290 Acute embolism and thrombosis of unspecified vein: Secondary | ICD-10-CM

## 2015-08-03 ENCOUNTER — Other Ambulatory Visit (HOSPITAL_COMMUNITY)
Admission: RE | Admit: 2015-08-03 | Discharge: 2015-08-03 | Disposition: A | Discharge status: Home / Self Care | Payer: PPO | Source: Ambulatory Visit | Attending: Cardiology | Admitting: Cardiology

## 2015-08-03 DIAGNOSIS — I10 Essential (primary) hypertension: Secondary | ICD-10-CM | POA: Insufficient documentation

## 2015-08-03 LAB — BASIC METABOLIC PANEL
Anion gap: 9 (ref 5–15)
BUN: 14 mg/dL (ref 6–20)
CALCIUM: 9.3 mg/dL (ref 8.9–10.3)
CHLORIDE: 105 mmol/L (ref 101–111)
CO2: 28 mmol/L (ref 22–32)
CREATININE: 0.91 mg/dL (ref 0.61–1.24)
Glucose, Bld: 140 mg/dL — ABNORMAL HIGH (ref 65–99)
Potassium: 3.8 mmol/L (ref 3.5–5.1)
SODIUM: 142 mmol/L (ref 135–145)

## 2015-08-06 ENCOUNTER — Ambulatory Visit (INDEPENDENT_AMBULATORY_CARE_PROVIDER_SITE_OTHER): Payer: PPO | Admitting: "Endocrinology

## 2015-08-06 ENCOUNTER — Ambulatory Visit: Payer: PPO | Admitting: Nutrition

## 2015-08-06 ENCOUNTER — Encounter: Payer: Self-pay | Admitting: "Endocrinology

## 2015-08-06 VITALS — BP 173/106 | HR 80 | Ht 66.5 in | Wt 265.0 lb

## 2015-08-06 DIAGNOSIS — E785 Hyperlipidemia, unspecified: Secondary | ICD-10-CM

## 2015-08-06 DIAGNOSIS — I10 Essential (primary) hypertension: Secondary | ICD-10-CM | POA: Diagnosis not present

## 2015-08-06 DIAGNOSIS — E1159 Type 2 diabetes mellitus with other circulatory complications: Secondary | ICD-10-CM | POA: Diagnosis not present

## 2015-08-06 DIAGNOSIS — E669 Obesity, unspecified: Secondary | ICD-10-CM | POA: Diagnosis not present

## 2015-08-06 NOTE — Patient Instructions (Signed)

## 2015-08-06 NOTE — Progress Notes (Signed)
Subjective:    Patient ID: Omar [name], male    DOB: 03-30-1946,    Past Medical History  Diagnosis Date  . 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 BPH (benign prostatic hyperplasia)   . Degenerative joint disease   . Meningioma (HCC)     Left frontal; CVA identified by MRI; no neurologic symptoms  . Hyperlipidemia   . Obesity   . Hepatic steatosis   . Abnormal EKG     Inferior Q waves  . Coronary artery disease   . Heart murmur   . Sleep apnea 2008    Dr. Merlene Laughter interpreted the study-CPAP recommended, but refused by patient  . Diabetes mellitus, type 2 (HCC)     No insulin  . Stroke [facility])    Past Surgical History  Procedure Laterality Date  . Tonsillectomy    . Ventral hernia repair  2010    umb hernia-AP-went home same day  . Colonoscopy w/ polypectomy  05/2008    polypectomy x4-adenomatous; internal hemorrhoids  . Dupuytren / palmar fasciotomy  2009    rt hand-dsc-went home same day  . Coronary angioplasty with stent placement  06/04/2012      DES    to [place]  . Coronary stent placement    . Left heart catheterization with coronary angiogram N/A 05/28/2012    Procedure: LEFT HEART CATHETERIZATION WITH CORONARY ANGIOGRAM;  Surgeon: Laverda Page, MD;  Location: [facility] CATH LAB;  Service: Cardiovascular;  Laterality: N/A;  . Percutaneous coronary stent intervention (pci-s) N/A 06/04/2012    Procedure: PERCUTANEOUS CORONARY STENT INTERVENTION (PCI-S);  Surgeon: Laverda Page, MD;  Location: [facility] CATH LAB;  Service: Cardiovascular;  Laterality: N/A;   Social History   Social History  . Marital Status: Married    Spouse Name: N/A  . Number of Children: N/A  . Years of Education: N/A   Occupational History  . Manufacturing engineer   Social History Main Topics  . Smoking status: Never Smoker   . Smokeless tobacco: Never Used  . Alcohol Use: No  . Drug Use: No  .  Sexual Activity: Not Currently   Other Topics Concern  . None   Social History Narrative   Outpatient Encounter Prescriptions as of 08/06/2015  Medication Sig  . apixaban (ELIQUIS) 5 MG TABS tablet Take 5 mg by mouth 2 (two) times daily.  Marland Kitchen aspirin 81 MG tablet Take 81 mg by mouth daily.  . canagliflozin (INVOKANA) 100 MG TABS tablet Take 1 tablet (100 mg total) by mouth daily.  Marland Kitchen eplerenone (INSPRA) 25 MG tablet Take 25 mg by mouth daily.  . ergocalciferol (VITAMIN D2) 50000 UNITS capsule Take 50,000 Units by mouth once a week.  . gabapentin (NEURONTIN) 100 MG capsule Take 100 mg by mouth 2 (two) times daily.  Marland Kitchen glucose blood (ONETOUCH VERIO) test strip Use as instructed  . lisinopril-hydrochlorothiazide (PRINZIDE,ZESTORETIC) 20-25 MG per tablet Take 1 tablet by mouth daily.  . metFORMIN (GLUCOPHAGE) 1000 MG tablet TAKE (1) TABLET BY MOUTH TWICE DAILY.  . nebivolol (BYSTOLIC) 10 MG tablet Take 20 mg by mouth daily.  . nitroGLYCERIN (NITROSTAT) 0.4 MG SL tablet Place 0.4 mg under the tongue every 5 (five) minutes as needed for chest pain.  . potassium chloride SA (K-DUR,KLOR-CON) 20 MEQ tablet Take 20 mEq by mouth daily.  . pravastatin (PRAVACHOL) 40 MG tablet Take  40 mg by mouth daily.  . Tamsulosin HCl (FLOMAX) 0.4 MG CAPS Take 0.4 mg by mouth daily after breakfast.  . traZODone (DESYREL) 100 MG tablet Take 100 mg by mouth at bedtime.  . verapamil (CALAN-SR) 240 MG CR tablet Take 1 tablet (240 mg total) by mouth at bedtime.  . [DISCONTINUED] metFORMIN (GLUCOPHAGE) 500 MG tablet Take by mouth 2 (two) times daily with a meal.   No facility-administered encounter medications on file as of 08/06/2015.   ALLERGIES: No Known Allergies VACCINATION STATUS:  There is no immunization history on file for this patient.  Diabetes He presents for his follow-up diabetic visit. He has type 2 diabetes mellitus. The initial diagnosis of diabetes was made 16 years ago. His disease course has been  worsening. There are no hypoglycemic associated symptoms. Pertinent negatives for hypoglycemia include no confusion, headaches, pallor or seizures. Pertinent negatives for diabetes include no chest pain, no fatigue, no polydipsia, no polyphagia, no polyuria and no weakness. Diabetic complications include heart disease and PVD. Risk factors for coronary artery disease include diabetes mellitus, dyslipidemia, obesity, male sex, hypertension and sedentary lifestyle. Current diabetic treatment includes oral agent (monotherapy). He is compliant with treatment some of the time. His weight is decreasing steadily. He has not had a previous visit with a dietitian. He participates in exercise intermittently. His home blood glucose trend is decreasing steadily. His overall blood glucose range is 140-180 mg/dl. An ACE inhibitor/angiotensin II receptor blocker is being taken. Eye exam is current.  Hypertension This is a chronic problem. The current episode started more than 1 year ago. The problem is uncontrolled. Pertinent negatives include no chest pain, headaches, neck pain, palpitations or shortness of breath. Past treatments include ACE inhibitors, beta blockers and calcium channel blockers. The current treatment provides no improvement. Hypertensive end-organ damage includes CAD/MI and PVD.  Hyperlipidemia This is a chronic problem. The current episode started more than 1 year ago. Pertinent negatives include no chest pain, myalgias or shortness of breath. Current antihyperlipidemic treatment includes statins. Risk factors for coronary artery disease include dyslipidemia, diabetes mellitus, hypertension, male sex, obesity and a sedentary lifestyle.   He was diagnosed with DVT of LLE since his last visit.  Review of Systems  Constitutional: Negative for fatigue and unexpected weight change.  HENT: Negative for dental problem, mouth sores and trouble swallowing.   Eyes: Negative for visual disturbance.   Respiratory: Negative for cough, choking, chest tightness, shortness of breath and wheezing.   Cardiovascular: Negative for chest pain, palpitations and leg swelling.  Gastrointestinal: Negative for nausea, vomiting, abdominal pain, diarrhea, constipation and abdominal distention.  Endocrine: Negative for polydipsia, polyphagia and polyuria.  Genitourinary: Negative for dysuria, urgency, hematuria and flank pain.  Musculoskeletal: Negative for myalgias, back pain, joint swelling, gait problem and neck pain.  Skin: Negative for pallor, rash and wound.  Neurological: Negative for seizures, syncope, weakness, numbness and headaches.  Psychiatric/Behavioral: Negative for suicidal ideas, hallucinations, confusion and dysphoric mood.    Objective:    BP 173/106 mmHg  Pulse 80  Ht 5' 6.5" (1.689 m)  Wt 265 lb (120.203 kg)  BMI 42.14 kg/m2  SpO2 97%  Wt Readings from Last 3 Encounters:  08/06/15 265 lb (120.203 kg)  07/02/15 276 lb (125.193 kg)  03/26/15 284 lb (128.822 kg)    Physical Exam  Constitutional: He is oriented to person, place, and time. He appears well-developed and well-nourished. He is cooperative.  HENT:  Head: Normocephalic and atraumatic.  Eyes: EOM are  normal.  Neck: Normal range of motion. Neck supple. No tracheal deviation present. No thyromegaly present.  Cardiovascular: Normal rate and normal heart sounds.  Exam reveals no gallop.   No murmur heard. Pulses:      Dorsalis pedis pulses are 0 on the right side, and 0 on the left side.       Posterior tibial pulses are 0 on the right side, and 0 on the left side.  Pulmonary/Chest: Breath sounds normal. No respiratory distress. He has no wheezes.  Abdominal: Soft. Bowel sounds are normal. He exhibits no distension. There is no hepatosplenomegaly. There is no tenderness. There is no guarding and no CVA tenderness.  Musculoskeletal: He exhibits no edema.       Right shoulder: He exhibits no swelling and no deformity.   Neurological: He is alert and oriented to person, place, and time. He has normal strength and normal reflexes. No cranial nerve deficit or sensory deficit. Gait normal.  Skin: Skin is warm and dry. No rash noted. No cyanosis. Nails show no clubbing.  Psychiatric: He has a normal mood and affect. His speech is normal and behavior is normal. Thought content normal. Cognition and memory are normal.    Results for orders placed or performed during the hospital encounter of 17/79/39  Basic metabolic panel  Result Value Ref Range   Sodium 142 135 - 145 mmol/L   Potassium 3.8 3.5 - 5.1 mmol/L   Chloride 105 101 - 111 mmol/L   CO2 28 22 - 32 mmol/L   Glucose, Bld 140 (H) 65 - 99 mg/dL   BUN 14 6 - 20 mg/dL   Creatinine, Ser 0.91 0.61 - 1.24 mg/dL   Calcium 9.3 8.9 - 10.3 mg/dL   GFR calc non Af Amer >60 >60 mL/min   GFR calc Af Amer >60 >60 mL/min   Anion gap 9 5 - 15   Complete Blood Count (Most recent): Lab Results  Component Value Date   WBC 9.8 06/17/2015   HGB 15.2 06/17/2015   HCT 44.0 06/17/2015   MCV 87.5 06/17/2015   PLT 282 06/17/2015   Chemistry (most recent): Lab Results  Component Value Date   NA 142 08/03/2015   K 3.8 08/03/2015   CL 105 08/03/2015   CO2 28 08/03/2015   BUN 14 08/03/2015   CREATININE 0.91 08/03/2015   Diabetic Labs (most recent): Lab Results  Component Value Date   HGBA1C 8.3* 06/17/2015   HGBA1C 7.7* 03/17/2015   HGBA1C 6.4* 06/04/2012   Lipid profile (most recent): No results found for: TRIG, CHOL       Assessment & Plan:   1. Type 2 diabetes mellitus with other circulatory complications  Patient is advised to stick to a routine mealtimes to eat 3 meals  a day and avoid unnecessary snacks ( to snack only to correct hypoglycemia). Patient is advised to eliminate simple carbs  from their diet including cakes desserts ice cream soda (  diet and regular) , sweet tea , Candies,  chips and cookies, artificial sweeteners,   and "sugar-free"  products .  This will help patient to have stable blood glucose profile and potentially lose weight. Patient is given detailed personalized glucose monitoring and insulin dosing instructions. Patient is instructed to call back with extremes of blood glucose less than 70 or greater than 300.  Based on his current BG profile, he will not need insulin for now.   He is  Advised to  Continue   Metformin  1000mg  po BID, add  Invokana 100mg   PO qAM. - he will continue  Monitor BG qam.   2. Hyperlipidemia: I advised him to continue Pravastatin 40 mg po qhs.  3. Obesity He has lost 32 lbs. Encouraged to continue carbs restriction.  4. Essential hypertension Controlled. Continue current meds including ACEI, beta blocker, CCB.     Follow up plan: Return in about 10 weeks (around 10/15/2015) for diabetes, high blood pressure, high cholesterol. With meter and logs for f/u.  Glade Lloyd, MD Phone: [phone]  Fax: [phone]   08/06/2015, 10:27 AM

## 2015-08-09 ENCOUNTER — Encounter: Payer: Self-pay | Admitting: Nutrition

## 2015-08-09 ENCOUNTER — Encounter: Payer: PPO | Attending: Pulmonary Disease | Admitting: Nutrition

## 2015-08-09 DIAGNOSIS — IMO0002 Reserved for concepts with insufficient information to code with codable children: Secondary | ICD-10-CM

## 2015-08-09 DIAGNOSIS — Z713 Dietary counseling and surveillance: Secondary | ICD-10-CM | POA: Diagnosis not present

## 2015-08-09 DIAGNOSIS — E1165 Type 2 diabetes mellitus with hyperglycemia: Secondary | ICD-10-CM

## 2015-08-09 DIAGNOSIS — Z6841 Body Mass Index (BMI) 40.0 and over, adult: Secondary | ICD-10-CM | POA: Insufficient documentation

## 2015-08-09 DIAGNOSIS — E118 Type 2 diabetes mellitus with unspecified complications: Secondary | ICD-10-CM

## 2015-08-09 NOTE — Progress Notes (Signed)
Medical Nutrition Therapy:  Appt start time: 1400 end time:  1430.  Assessment:  Primary concerns today: Diabetes Type 2. Most Recent  8.3%. Sees Dr. Dorris Fetch for his DM. Currently on Invokana 100 mg and Metformin 2000 mg/d. PMH: DM, HTN, Hyperlipidemia, CVD, PVD, Obesity.  Testing blood sugar twice a day. FBS 130-160's. PM blood sugars 140-200's. Downloaded meter. Avg BS 154 mg/dl. Has lost about 50+ lbs in the last year. Has a blood clot he notes in his left leg. Needs to get some compression hose stockings he notes. He notes he is in a hurry today and can't stay very long. Didn't want to weight today but did anyway. Wt same as last week. His wife brought his food journal and BS log with them. Diet is improving.  Eats out at lunch often and not able to make good choices often.. Still getting some extra carbs at night contributing to elevated blood sugars before bed. Diet is low in low carb vegetables and excessive in CHO's at times. Needs to increase physical activity when released by doctor for blood clot.   Lab Results  Component Value Date   HGBA1C 8.3* 06/17/2015               Wt Readings from Last 3 Encounters:  08/09/15 275 lb (124.739 kg)  08/06/15 265 lb (120.203 kg)  07/02/15 276 lb (125.193 kg)   Ht Readings from Last 3 Encounters:  08/09/15 5\' 8"  (1.727 m)  08/06/15 5' 6.5" (1.689 m)  07/02/15 5' 6.5" (1.689 m)   Body mass index is 41.82 kg/(m^2).     Preferred Learning Style: Auditory  Visual  Hands on    Learning Readiness:   Ready  Change in progress   MEDICATIONS: See list   DIETARY INTAKE:   24-hr recall:  B ( AM): 2egg, 2 slices toast, fruit, water or milk Snk ( AM): none  L ( PM): Meatloaf, potatoes, green beans, fruit and cornbread, water Snk ( PM):  D ( PM): chicken, rice or corn and broccoli, water Snk ( PM): Beverages: water, unsweet tea  Fruit or crackers sometimes  Usual physical activity: ADL. Has blood clot and can't exercise much right  now.  Estimated energy needs: 1800 calories 200 g carbohydrates 135 g protein 50 g fat  Progress Towards Goal(s):  In progress.   Nutritional Diagnosis:  NB-1.1 Food and nutrition-related knowledge deficit As related to Diabetes.  As evidenced by A1C 8.3%..    Intervention:   Nutrition and Diabetes education provided on My Plate, CHO counting, meal planning, portion sizes, timing of meals, avoiding snacks between meals unless having a low blood sugar, target ranges for A1C and blood sugars, signs/symptoms and treatment of hyper/hypoglycemia, monitoring blood sugars, taking medications as prescribed, benefits of exercising 30 minutes per day and prevention of complications of DM.   Goals: 1. Follow My Plate 2. Avoid snacks between meals. 3. Eat 30-45 g of carbs per meal 4. Increase fresh vegetables and fruit 5. Avoid fast foods. 6. Drink only water 7. Get compression stockings if MD suggested them for swelling in legs. 8.Get A1C down to 7.5% in three months 9. Take meds as prescribed. 10. Avoid cakes, candy, sweets, pies, desserts and diet sodas. Teaching Method Utilized:  Visual Auditory Hands on  Handouts given during visit include:  The Plate Method  Meal Plan card    Barriers to learning/adherence to lifestyle change: None  Demonstrated degree of understanding via:  Teach Back   Monitoring/Evaluation:  Dietary intake, exercise, meal planning, SBG, and body weight in 3 month(s).

## 2015-08-09 NOTE — Patient Instructions (Signed)
Goals: 1. Follow My Plate 2. Avoid snacks between meals. 3. Eat 30-45 g of carbs per meal 4. Increase fresh vegetables and fruit 5. Avoid fast foods. 6. Drink only water 7. Get compression stockings if MD suggested them for swelling in legs. 8.Get A1C down to 7.5% in three months 9. Take meds as prescribed. 10. Avoid cakes, candy, sweets, pies, desserts and diet sodas.

## 2015-09-02 ENCOUNTER — Other Ambulatory Visit: Payer: Self-pay | Admitting: "Endocrinology

## 2015-09-06 ENCOUNTER — Telehealth: Payer: Self-pay

## 2015-09-06 ENCOUNTER — Other Ambulatory Visit: Payer: Self-pay | Admitting: "Endocrinology

## 2015-09-06 MED ORDER — SITAGLIPTIN PHOSPHATE 25 MG PO TABS: 25.0000 mg | ORAL_TABLET | Freq: Every day | ORAL | Status: DC

## 2015-09-06 NOTE — Telephone Encounter (Signed)
I will send a Januvia Rx to his pharmacy. He has to let us know when he gets out of the hole.

## 2015-09-06 NOTE — Telephone Encounter (Signed)
Pt is in the "dounut hole" and cannot afford the Invokana @ $225. He wants to know what we can change it to.

## 2015-09-08 ENCOUNTER — Telehealth: Payer: Self-pay

## 2015-09-08 NOTE — Telephone Encounter (Signed)
Left message for pt to call back  °

## 2015-09-08 NOTE — Telephone Encounter (Signed)
Pts wife called extremely upset that her husbands new prescription that was called in was $185. She has asked repeatedly for samples. I have told her that our office does not accept any oral medications. She also states that she never receives a call back. However, attempts were made to contact pt. She wants to know what other medication he can take. Pts wife says that if we cannot give the pt something more affordable that he will not take any medications until January 2017. Pt is in the "Medicare Dounut Hole". This is why his copays are so expensive.

## 2015-09-08 NOTE — Telephone Encounter (Signed)
Pt notified after several attempts to call. Pt will contact pharmacy to see what copay will be.

## 2015-09-08 NOTE — Telephone Encounter (Signed)
The next best thing to do will be for him to get on  insulin ( novolin) along with his metformin. There are no more  cheap oral medication options for him. If he wants to start insulin, he has to come in for a visit. Until  that  visit, he needs to start testing BG 4 times a day, before meals and at bed time.

## 2015-09-09 NOTE — Telephone Encounter (Signed)
Pt notified and he does not want to start insulin. He says that he will just continue on the Metformin until January when his insurance will cover the [employer].

## 2015-09-16 ENCOUNTER — Ambulatory Visit (HOSPITAL_COMMUNITY)
Admission: RE | Admit: 2015-09-16 | Discharge: 2015-09-16 | Disposition: A | Discharge status: Home / Self Care | Payer: PPO | Source: Ambulatory Visit | Attending: Pulmonary Disease | Admitting: Pulmonary Disease

## 2015-09-16 DIAGNOSIS — Z7901 Long term (current) use of anticoagulants: Secondary | ICD-10-CM | POA: Insufficient documentation

## 2015-09-16 DIAGNOSIS — I82432 Acute embolism and thrombosis of left popliteal vein: Secondary | ICD-10-CM | POA: Insufficient documentation

## 2015-09-16 DIAGNOSIS — I82412 Acute embolism and thrombosis of left femoral vein: Secondary | ICD-10-CM | POA: Diagnosis not present

## 2015-09-16 DIAGNOSIS — I8290 Acute embolism and thrombosis of unspecified vein: Secondary | ICD-10-CM

## 2015-10-11 ENCOUNTER — Other Ambulatory Visit: Payer: Self-pay | Admitting: "Endocrinology

## 2015-10-11 DIAGNOSIS — E1159 Type 2 diabetes mellitus with other circulatory complications: Secondary | ICD-10-CM | POA: Diagnosis not present

## 2015-10-11 DIAGNOSIS — E669 Obesity, unspecified: Secondary | ICD-10-CM | POA: Diagnosis not present

## 2015-10-11 LAB — TSH: TSH: 1.903 u[IU]/mL (ref 0.350–4.500)

## 2015-10-11 LAB — BASIC METABOLIC PANEL
BUN: 20 mg/dL (ref 7–25)
CALCIUM: 9.6 mg/dL (ref 8.6–10.3)
CO2: 26 mmol/L (ref 20–31)
CREATININE: 0.94 mg/dL (ref 0.70–1.18)
Chloride: 101 mmol/L (ref 98–110)
GLUCOSE: 147 mg/dL — AB (ref 65–99)
Potassium: 4.2 mmol/L (ref 3.5–5.3)
SODIUM: 140 mmol/L (ref 135–146)

## 2015-10-11 LAB — HEMOGLOBIN A1C
HEMOGLOBIN A1C: 7.1 % — AB (ref <5.7)
MEAN PLASMA GLUCOSE: 157 mg/dL — AB (ref <117)

## 2015-10-11 LAB — T4, FREE: Free T4: 0.82 ng/dL (ref 0.80–1.80)

## 2015-10-15 ENCOUNTER — Encounter: Payer: PPO | Attending: "Endocrinology | Admitting: Nutrition

## 2015-10-15 ENCOUNTER — Encounter: Payer: Self-pay | Admitting: "Endocrinology

## 2015-10-15 ENCOUNTER — Ambulatory Visit (INDEPENDENT_AMBULATORY_CARE_PROVIDER_SITE_OTHER): Payer: PPO | Admitting: "Endocrinology

## 2015-10-15 VITALS — Ht 68.0 in | Wt 259.0 lb

## 2015-10-15 VITALS — BP 141/83 | HR 70 | Ht 68.0 in | Wt 259.0 lb

## 2015-10-15 DIAGNOSIS — E118 Type 2 diabetes mellitus with unspecified complications: Secondary | ICD-10-CM

## 2015-10-15 DIAGNOSIS — E119 Type 2 diabetes mellitus without complications: Secondary | ICD-10-CM | POA: Insufficient documentation

## 2015-10-15 DIAGNOSIS — I10 Essential (primary) hypertension: Secondary | ICD-10-CM

## 2015-10-15 DIAGNOSIS — E669 Obesity, unspecified: Secondary | ICD-10-CM | POA: Diagnosis not present

## 2015-10-15 DIAGNOSIS — E785 Hyperlipidemia, unspecified: Secondary | ICD-10-CM

## 2015-10-15 DIAGNOSIS — E1159 Type 2 diabetes mellitus with other circulatory complications: Secondary | ICD-10-CM | POA: Diagnosis not present

## 2015-10-15 DIAGNOSIS — E1165 Type 2 diabetes mellitus with hyperglycemia: Secondary | ICD-10-CM

## 2015-10-15 DIAGNOSIS — IMO0002 Reserved for concepts with insufficient information to code with codable children: Secondary | ICD-10-CM

## 2015-10-15 NOTE — Patient Instructions (Signed)

## 2015-10-15 NOTE — Patient Instructions (Addendum)
  Goals: 1. Follow My Plate 2. Avoid snacks between meals. Drink 1% milk. 30 minutes of walking three times per day. 3. Eat 30-45 g of carbs per meal 4. Increase fresh vegetables and fruit 5.  Lose 1-2 lb per week. 6..Get A1C down to 7.0% in three months 7. Take meds as prescribed. 8. Avoid cakes, candy, sweets, pies, desserts and diet sodas.

## 2015-10-15 NOTE — Progress Notes (Signed)
  Medical Nutrition Therapy:  Appt start time: 0830 end time:  0900.  Assessment:  Primary concerns today: Diabetes Type 2. Most Recent A1C   7.1% down from  8.3%.  Sees Dr. Dorris Fetch for his DM. Currently on Invokana 100 mg and Metformin 2000 mg/d. PMH: DM, HTN, Hyperlipidemia, CVD, PVD, Obesity.  Testing blood sugar twice a day.  Downloaded meter. Avg BS 154 mg/dl. Has lost 6 lbs since last visit. Eating more fresh fruits and vegetables and eating better balanced meals. Has a blood clot he notes in his left leg. Got ompression hose stockings he notes. Diet is improving.  Eats out at lunch and trying to make better choices.. Still getting some extra carbs at night contributing to elevated blood sugars before bed.  Needs to work on increasing physical activity for further weight loss and improve blood sugars.   Lab Results  Component Value Date   HGBA1C 7.1* 10/11/2015               Wt Readings from Last 3 Encounters:  10/15/15 259 lb (117.482 kg)  08/09/15 265 lb (120.203 kg)  08/06/15 265 lb (120.203 kg)   Ht Readings from Last 3 Encounters:  10/15/15 5\' 8"  (1.727 m)  08/09/15 5' 6.5" (1.689 m)  08/06/15 5' 6.5" (1.689 m)   Body mass index is 39.39 kg/(m^2).     Preferred Learning Style: Auditory  Visual  Hands on    Learning Readiness:   Ready  Change in progress   MEDICATIONS: See list   DIETARY INTAKE:   24-hr recall:  B ( AM): Cornflakes with 2%  Eggs sometimes and toast. Snk ( AM): none  L ( PM): Meat and 2 vegetables and fruit, water Snk ( PM):  D ( PM): Meat and vegetables, water Snk ( PM): Beverages: water, unsweet tea  Fruit or crackers sometimes  Usual physical activity: ADL. Estimated energy needs: 1800 calories 200 g carbohydrates 135 g protein 50 g fat  Progress Towards Goal(s):  In progress.   Nutritional Diagnosis:  NB-1.1 Food and nutrition-related knowledge deficit As related to Diabetes.  As evidenced by A1C 8.3%..    Intervention:    Nutrition and Diabetes education provided on My Plate, CHO counting, meal planning, portion sizes, timing of meals, avoiding snacks between meals unless having a low blood sugar, target ranges for A1C and blood sugars, signs/symptoms and treatment of hyper/hypoglycemia, monitoring blood sugars, taking medications as prescribed, benefits of exercising 30 minutes per day and prevention of complications of DM.   Goals: 1. Follow My Plate 2. Avoid snacks between meals. Drink 1% milk. 30 minutes of walking three times per day. 3. Eat 30-45 g of carbs per meal 4. Increase fresh vegetables and fruit 5.  Lose 1-2 lb per week. 6..Get A1C down to 7.0% in three months 7. Take meds as prescribed. 8. Avoid cakes, candy, sweets, pies, desserts and diet sodas.     Teaching Method Utilized:  Visual Auditory Hands on  Handouts given during visit include:  The Plate Method  Meal Plan card    Barriers to learning/adherence to lifestyle change: None  Demonstrated degree of understanding via:  Teach Back   Monitoring/Evaluation:  Dietary intake, exercise, meal planning, SBG, and body weight in 3 month(s).

## 2015-10-15 NOTE — Progress Notes (Signed)
Subjective:    Patient ID: Omar [name], male    DOB: 07/17/1946,    Past Medical History  Diagnosis Date  . 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 BPH (benign prostatic hyperplasia)   . Degenerative joint disease   . Meningioma (HCC)     Left frontal; CVA identified by MRI; no neurologic symptoms  . Hyperlipidemia   . Obesity   . Hepatic steatosis   . Abnormal EKG     Inferior Q waves  . Coronary artery disease   . Heart murmur   . Sleep apnea 2008    Dr. Merlene Laughter interpreted the study-CPAP recommended, but refused by patient  . Diabetes mellitus, type 2 (HCC)     No insulin  . Stroke [facility])    Past Surgical History  Procedure Laterality Date  . Tonsillectomy    . Ventral hernia repair  2010    umb hernia-AP-went home same day  . Colonoscopy w/ polypectomy  05/2008    polypectomy x4-adenomatous; internal hemorrhoids  . Dupuytren / palmar fasciotomy  2009    rt hand-dsc-went home same day  . Coronary angioplasty with stent placement  06/04/2012      DES    to [place]  . Coronary stent placement    . Left heart catheterization with coronary angiogram N/A 05/28/2012    Procedure: LEFT HEART CATHETERIZATION WITH CORONARY ANGIOGRAM;  Surgeon: Laverda Page, MD;  Location: [facility] CATH LAB;  Service: Cardiovascular;  Laterality: N/A;  . Percutaneous coronary stent intervention (pci-s) N/A 06/04/2012    Procedure: PERCUTANEOUS CORONARY STENT INTERVENTION (PCI-S);  Surgeon: Laverda Page, MD;  Location: [facility] CATH LAB;  Service: Cardiovascular;  Laterality: N/A;   Social History   Social History  . Marital Status: Married    Spouse Name: N/A  . Number of Children: N/A  . Years of Education: N/A   Occupational History  . Manufacturing engineer   Social History Main Topics  . Smoking status: Never Smoker   . Smokeless tobacco: Never Used  . Alcohol Use: No  . Drug Use: No  .  Sexual Activity: Not Currently   Other Topics Concern  . None   Social History Narrative   Outpatient Encounter Prescriptions as of 10/15/2015  Medication Sig  . apixaban (ELIQUIS) 5 MG TABS tablet Take 5 mg by mouth 2 (two) times daily.  Marland Kitchen aspirin 81 MG tablet Take 81 mg by mouth daily.  . canagliflozin (INVOKANA) 100 MG TABS tablet Take 100 mg by mouth daily before breakfast.  . eplerenone (INSPRA) 25 MG tablet Take 25 mg by mouth daily.  . ergocalciferol (VITAMIN D2) 50000 UNITS capsule Take 50,000 Units by mouth once a week.  . gabapentin (NEURONTIN) 100 MG capsule Take 100 mg by mouth 2 (two) times daily.  Marland Kitchen glucose blood (ONETOUCH VERIO) test strip Use as instructed  . lisinopril-hydrochlorothiazide (PRINZIDE,ZESTORETIC) 20-25 MG per tablet Take 1 tablet by mouth daily.  . metFORMIN (GLUCOPHAGE) 1000 MG tablet TAKE (1) TABLET BY MOUTH TWICE DAILY.  . nebivolol (BYSTOLIC) 10 MG tablet Take 20 mg by mouth daily.  . nitroGLYCERIN (NITROSTAT) 0.4 MG SL tablet Place 0.4 mg under the tongue every 5 (five) minutes as needed for chest pain.  . pravastatin (PRAVACHOL) 40 MG tablet Take 40 mg by mouth daily.  . Tamsulosin HCl (FLOMAX) 0.4 MG CAPS Take 0.4 mg  by mouth daily after breakfast.  . traZODone (DESYREL) 100 MG tablet Take 100 mg by mouth at bedtime.  . verapamil (CALAN-SR) 240 MG CR tablet Take 1 tablet (240 mg total) by mouth at bedtime.  . potassium chloride SA (K-DUR,KLOR-CON) 20 MEQ tablet Take 20 mEq by mouth daily. Reported on 10/15/2015  . [DISCONTINUED] sitaGLIPtin (JANUVIA) 25 MG tablet Take 1 tablet (25 mg total) by mouth daily. (Patient not taking: Reported on 10/15/2015)   No facility-administered encounter medications on file as of 10/15/2015.   ALLERGIES: No Known Allergies VACCINATION STATUS:  There is no immunization history on file for this patient.  Diabetes He presents for his follow-up diabetic visit. He has type 2 diabetes mellitus. The initial diagnosis of  diabetes was made 16 years ago. His disease course has been improving. There are no hypoglycemic associated symptoms. Pertinent negatives for hypoglycemia include no confusion, headaches, pallor or seizures. Pertinent negatives for diabetes include no chest pain, no fatigue, no polydipsia, no polyphagia, no polyuria and no weakness. Symptoms are improving. Diabetic complications include heart disease and PVD. Risk factors for coronary artery disease include diabetes mellitus, dyslipidemia, obesity, male sex, hypertension and sedentary lifestyle. Current diabetic treatment includes oral agent (monotherapy). He is compliant with treatment some of the time. His weight is decreasing steadily (Overall he lost 53 pounds from November 2015.). He is following a diabetic diet. He has had a previous visit with a dietitian. He participates in exercise intermittently. His home blood glucose trend is decreasing steadily. His overall blood glucose range is 140-180 mg/dl. An ACE inhibitor/angiotensin II receptor blocker is being taken. Eye exam is current.  Hypertension This is a chronic problem. The current episode started more than 1 year ago. The problem is uncontrolled. Pertinent negatives include no chest pain, headaches, neck pain, palpitations or shortness of breath. Past treatments include ACE inhibitors, beta blockers and calcium channel blockers. The current treatment provides no improvement. Hypertensive end-organ damage includes CAD/MI and PVD.  Hyperlipidemia This is a chronic problem. The current episode started more than 1 year ago. Pertinent negatives include no chest pain, myalgias or shortness of breath. Current antihyperlipidemic treatment includes statins. Risk factors for coronary artery disease include dyslipidemia, diabetes mellitus, hypertension, male sex, obesity and a sedentary lifestyle.   He is being treated for  DVT of LLE .  Review of Systems  Constitutional: Negative for fatigue and  unexpected weight change.  HENT: Negative for dental problem, mouth sores and trouble swallowing.   Eyes: Negative for visual disturbance.  Respiratory: Negative for cough, choking, chest tightness, shortness of breath and wheezing.   Cardiovascular: Negative for chest pain, palpitations and leg swelling.  Gastrointestinal: Negative for nausea, vomiting, abdominal pain, diarrhea, constipation and abdominal distention.  Endocrine: Negative for polydipsia, polyphagia and polyuria.  Genitourinary: Negative for dysuria, urgency, hematuria and flank pain.  Musculoskeletal: Negative for myalgias, back pain, joint swelling, gait problem and neck pain.  Skin: Negative for pallor, rash and wound.  Neurological: Negative for seizures, syncope, weakness, numbness and headaches.  Psychiatric/Behavioral: Negative for suicidal ideas, hallucinations, confusion and dysphoric mood.    Objective:    BP 141/83 mmHg  Pulse 70  Ht 5\' 8"  (1.727 m)  Wt 259 lb (117.482 kg)  BMI 39.39 kg/m2  SpO2 95%  Wt Readings from Last 3 Encounters:  10/15/15 259 lb (117.482 kg)  10/15/15 259 lb (117.482 kg)  08/09/15 265 lb (120.203 kg)    Physical Exam  Constitutional: He is oriented to person,  place, and time. He appears well-developed and well-nourished. He is cooperative.  HENT:  Head: Normocephalic and atraumatic.  Eyes: EOM are normal.  Neck: Normal range of motion. Neck supple. No tracheal deviation present. No thyromegaly present.  Cardiovascular: Normal rate and normal heart sounds.  Exam reveals no gallop.   No murmur heard. Pulses:      Dorsalis pedis pulses are 0 on the right side, and 0 on the left side.       Posterior tibial pulses are 0 on the right side, and 0 on the left side.  Pulmonary/Chest: Breath sounds normal. No respiratory distress. He has no wheezes.  Abdominal: Soft. Bowel sounds are normal. He exhibits no distension. There is no hepatosplenomegaly. There is no tenderness. There is no  guarding and no CVA tenderness.  Musculoskeletal: He exhibits no edema.       Right shoulder: He exhibits no swelling and no deformity.  Neurological: He is alert and oriented to person, place, and time. He has normal strength and normal reflexes. No cranial nerve deficit or sensory deficit. Gait normal.  Skin: Skin is warm and dry. No rash noted. No cyanosis. Nails show no clubbing.  Psychiatric: He has a normal mood and affect. His speech is normal and behavior is normal. Thought content normal. Cognition and memory are normal.    Results for orders placed or performed in visit on [id number]  Basic metabolic panel  Result Value Ref Range   Sodium 140 135 - 146 mmol/L   Potassium 4.2 3.5 - 5.3 mmol/L   Chloride 101 98 - 110 mmol/L   CO2 26 20 - 31 mmol/L   Glucose, Bld 147 (H) 65 - 99 mg/dL   BUN 20 7 - 25 mg/dL   Creat 0.94 0.70 - 1.18 mg/dL   Calcium 9.6 8.6 - 10.3 mg/dL  TSH  Result Value Ref Range   TSH 1.903 0.350 - 4.500 uIU/mL  T4, free  Result Value Ref Range   Free T4 0.82 0.80 - 1.80 ng/dL  Hemoglobin A1c  Result Value Ref Range   Hgb A1c MFr Bld 7.1 (H) <5.7 %   Mean Plasma Glucose 157 (H) <117 mg/dL   Diabetic Labs (most recent): Lab Results  Component Value Date   HGBA1C 7.1* 10/11/2015   HGBA1C 8.3* 06/17/2015   HGBA1C 7.7* 03/17/2015      Assessment & Plan:   1. Type 2 diabetes mellitus with other circulatory complications -He remains at risk for more complications of diabetes. -He came with well controlled A1c at 7.1% and overall weight loss of 53 pounds since November 2015. Patient is advised to stick to a routine mealtimes to eat 3 meals  a day and avoid unnecessary snacks ( to snack only to correct hypoglycemia). Patient is advised to eliminate simple carbs  from their diet including cakes desserts ice cream soda (  diet and regular) , sweet tea , Candies,  chips and cookies, artificial sweeteners,   and "sugar-free" products .  This will help patient  to have stable blood glucose profile and potentially lose weight. Patient is given detailed personalized glucose monitoring and insulin dosing instructions. Patient is instructed to call back with extremes of blood glucose less than 70 or greater than 300.  Based on his current progress, he will not need insulin for now.   He is  Advised to  Continue   Metformin 1000mg  po BID, and  Invokana 100mg   PO qAM. he is getting  Invokana samples  currently from his primary medical doctor, he will call for prescription when he runs out.  2. Hyperlipidemia: I advised him to continue Pravastatin 40 mg po qhs.  3. Obesity Dietitian consult in progress, He has lost 53 lbs. Encouraged to continue carbs restriction.  4. Essential hypertension Controlled. Continue current meds including ACEI, beta blocker, CCB.   Follow up plan: Return in about 3 months (around 01/13/2016) for diabetes, high blood pressure, high cholesterol, follow up with pre-visit labs, meter, and logs. With meter and logs for f/u.  Glade Lloyd, MD Phone: [phone]  Fax: [phone]   10/15/2015, 9:25 AM

## 2015-10-19 ENCOUNTER — Other Ambulatory Visit: Payer: Self-pay | Admitting: "Endocrinology

## 2015-10-25 DIAGNOSIS — N529 Male erectile dysfunction, unspecified: Secondary | ICD-10-CM | POA: Diagnosis not present

## 2015-11-01 ENCOUNTER — Other Ambulatory Visit: Payer: Self-pay | Admitting: "Endocrinology

## 2015-11-11 DIAGNOSIS — H25811 Combined forms of age-related cataract, right eye: Secondary | ICD-10-CM | POA: Diagnosis not present

## 2015-11-11 LAB — HM DIABETES EYE EXAM

## 2015-12-02 ENCOUNTER — Other Ambulatory Visit (HOSPITAL_COMMUNITY): Payer: Self-pay | Admitting: Pulmonary Disease

## 2015-12-02 DIAGNOSIS — O2230 Deep phlebothrombosis in pregnancy, unspecified trimester: Secondary | ICD-10-CM

## 2015-12-02 DIAGNOSIS — I82402 Acute embolism and thrombosis of unspecified deep veins of left lower extremity: Secondary | ICD-10-CM

## 2015-12-10 ENCOUNTER — Other Ambulatory Visit (HOSPITAL_COMMUNITY): Payer: PPO

## 2015-12-10 ENCOUNTER — Ambulatory Visit (HOSPITAL_COMMUNITY)
Admission: RE | Admit: 2015-12-10 | Discharge: 2015-12-10 | Disposition: A | Discharge status: Home / Self Care | Payer: PPO | Source: Ambulatory Visit | Attending: Pulmonary Disease | Admitting: Pulmonary Disease

## 2015-12-10 DIAGNOSIS — I82412 Acute embolism and thrombosis of left femoral vein: Secondary | ICD-10-CM | POA: Insufficient documentation

## 2015-12-10 DIAGNOSIS — I82402 Acute embolism and thrombosis of unspecified deep veins of left lower extremity: Secondary | ICD-10-CM | POA: Diagnosis not present

## 2015-12-10 DIAGNOSIS — I82432 Acute embolism and thrombosis of left popliteal vein: Secondary | ICD-10-CM | POA: Insufficient documentation

## 2015-12-14 DIAGNOSIS — N529 Male erectile dysfunction, unspecified: Secondary | ICD-10-CM | POA: Diagnosis not present

## 2015-12-20 ENCOUNTER — Other Ambulatory Visit: Payer: Self-pay | Admitting: *Deleted

## 2015-12-20 DIAGNOSIS — M7989 Other specified soft tissue disorders: Secondary | ICD-10-CM

## 2016-01-19 ENCOUNTER — Other Ambulatory Visit: Payer: Self-pay | Admitting: "Endocrinology

## 2016-01-19 DIAGNOSIS — Z125 Encounter for screening for malignant neoplasm of prostate: Secondary | ICD-10-CM | POA: Diagnosis not present

## 2016-01-19 DIAGNOSIS — E1159 Type 2 diabetes mellitus with other circulatory complications: Secondary | ICD-10-CM | POA: Diagnosis not present

## 2016-01-19 LAB — BASIC METABOLIC PANEL
BUN: 15 mg/dL (ref 7–25)
CALCIUM: 9 mg/dL (ref 8.6–10.3)
CO2: 27 mmol/L (ref 20–31)
Chloride: 104 mmol/L (ref 98–110)
Creat: 0.91 mg/dL (ref 0.70–1.18)
Glucose, Bld: 149 mg/dL — ABNORMAL HIGH (ref 65–99)
POTASSIUM: 3.9 mmol/L (ref 3.5–5.3)
SODIUM: 142 mmol/L (ref 135–146)

## 2016-01-19 LAB — HEMOGLOBIN A1C
Hgb A1c MFr Bld: 6.8 % — ABNORMAL HIGH (ref <5.7)
Mean Plasma Glucose: 148 mg/dL

## 2016-01-21 ENCOUNTER — Encounter: Payer: Self-pay | Admitting: "Endocrinology

## 2016-01-21 ENCOUNTER — Ambulatory Visit (INDEPENDENT_AMBULATORY_CARE_PROVIDER_SITE_OTHER): Payer: PPO | Admitting: "Endocrinology

## 2016-01-21 VITALS — BP 139/82 | HR 80 | Ht 68.0 in | Wt 252.0 lb

## 2016-01-21 DIAGNOSIS — E785 Hyperlipidemia, unspecified: Secondary | ICD-10-CM | POA: Diagnosis not present

## 2016-01-21 DIAGNOSIS — E669 Obesity, unspecified: Secondary | ICD-10-CM

## 2016-01-21 DIAGNOSIS — E1159 Type 2 diabetes mellitus with other circulatory complications: Secondary | ICD-10-CM | POA: Diagnosis not present

## 2016-01-21 DIAGNOSIS — I10 Essential (primary) hypertension: Secondary | ICD-10-CM | POA: Diagnosis not present

## 2016-01-21 DIAGNOSIS — E559 Vitamin D deficiency, unspecified: Secondary | ICD-10-CM

## 2016-01-21 DIAGNOSIS — N529 Male erectile dysfunction, unspecified: Secondary | ICD-10-CM | POA: Diagnosis not present

## 2016-01-21 NOTE — Progress Notes (Signed)
Subjective:    Patient ID: Omar [name], male    DOB: 09-May-1946,    Past Medical History  Diagnosis Date  . 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 BPH (benign prostatic hyperplasia)   . Degenerative joint disease   . Meningioma (HCC)     Left frontal; CVA identified by MRI; no neurologic symptoms  . Hyperlipidemia   . Obesity   . Hepatic steatosis   . Abnormal EKG     Inferior Q waves  . Coronary artery disease   . Heart murmur   . Sleep apnea 2008    Dr. Merlene Laughter interpreted the study-CPAP recommended, but refused by patient  . Diabetes mellitus, type 2 (HCC)     No insulin  . Stroke [facility])    Past Surgical History  Procedure Laterality Date  . Tonsillectomy    . Ventral hernia repair  2010    umb hernia-AP-went home same day  . Colonoscopy w/ polypectomy  05/2008    polypectomy x4-adenomatous; internal hemorrhoids  . Dupuytren / palmar fasciotomy  2009    rt hand-dsc-went home same day  . Coronary angioplasty with stent placement  06/04/2012      DES    to [place]  . Coronary stent placement    . Left heart catheterization with coronary angiogram N/A 05/28/2012    Procedure: LEFT HEART CATHETERIZATION WITH CORONARY ANGIOGRAM;  Surgeon: Laverda Page, MD;  Location: [facility] CATH LAB;  Service: Cardiovascular;  Laterality: N/A;  . Percutaneous coronary stent intervention (pci-s) N/A 06/04/2012    Procedure: PERCUTANEOUS CORONARY STENT INTERVENTION (PCI-S);  Surgeon: Laverda Page, MD;  Location: [facility] CATH LAB;  Service: Cardiovascular;  Laterality: N/A;   Social History   Social History  . Marital Status: Married    Spouse Name: N/A  . Number of Children: N/A  . Years of Education: N/A   Occupational History  . Manufacturing engineer   Social History Main Topics  . Smoking status: Never Smoker   . Smokeless tobacco: Never Used  . Alcohol Use: No  . Drug Use: No  .  Sexual Activity: Not Currently   Other Topics Concern  . None   Social History Narrative   Outpatient Encounter Prescriptions as of 01/21/2016  Medication Sig  . apixaban (ELIQUIS) 5 MG TABS tablet Take 5 mg by mouth 2 (two) times daily.  Marland Kitchen aspirin 81 MG tablet Take 81 mg by mouth daily.  . canagliflozin (INVOKANA) 100 MG TABS tablet Take 100 mg by mouth daily before breakfast.  . eplerenone (INSPRA) 25 MG tablet Take 25 mg by mouth daily.  Marland Kitchen gabapentin (NEURONTIN) 100 MG capsule TAKE (1) CAPSULE BY MOUTH TWICE DAILY.  Marland Kitchen glucose blood (ONETOUCH VERIO) test strip Use as instructed  . lisinopril-hydrochlorothiazide (PRINZIDE,ZESTORETIC) 20-25 MG per tablet Take 1 tablet by mouth daily.  . metFORMIN (GLUCOPHAGE) 1000 MG tablet TAKE (1) TABLET BY MOUTH TWICE DAILY.  . nebivolol (BYSTOLIC) 10 MG tablet Take 20 mg by mouth daily.  . nitroGLYCERIN (NITROSTAT) 0.4 MG SL tablet Place 0.4 mg under the tongue every 5 (five) minutes as needed for chest pain.  . pravastatin (PRAVACHOL) 40 MG tablet Take 40 mg by mouth daily.  . Tamsulosin HCl (FLOMAX) 0.4 MG CAPS Take 0.4 mg by mouth daily after breakfast.  . testosterone cypionate (DEPOTESTOTERONE CYPIONATE) 100 MG/ML injection Inject into the muscle  every 28 (twenty-eight) days. For IM use only  . traZODone (DESYREL) 100 MG tablet Take 100 mg by mouth at bedtime.  . verapamil (CALAN-SR) 240 MG CR tablet Take 1 tablet (240 mg total) by mouth at bedtime.  . Vitamin D, Ergocalciferol, (DRISDOL) 50000 units CAPS capsule TAKE 1 CAPSULE BY MOUTH ONCE A WEEK.  . [DISCONTINUED] potassium chloride SA (K-DUR,KLOR-CON) 20 MEQ tablet Take 20 mEq by mouth daily. Reported on 10/15/2015   No facility-administered encounter medications on file as of 01/21/2016.   ALLERGIES: No Known Allergies VACCINATION STATUS:  There is no immunization history on file for this patient.  Diabetes He presents for his follow-up diabetic visit. He has type 2 diabetes mellitus.  The initial diagnosis of diabetes was made 16 years ago. His disease course has been improving. There are no hypoglycemic associated symptoms. Pertinent negatives for hypoglycemia include no confusion, headaches, pallor or seizures. Pertinent negatives for diabetes include no chest pain, no fatigue, no polydipsia, no polyphagia, no polyuria and no weakness. Symptoms are improving. Diabetic complications include heart disease and PVD. Risk factors for coronary artery disease include diabetes mellitus, dyslipidemia, obesity, male sex, hypertension and sedentary lifestyle. Current diabetic treatment includes oral agent (monotherapy). He is compliant with treatment some of the time. His weight is decreasing steadily (Overall he lost 69 pounds from November 2015.). He is following a diabetic diet. He has had a previous visit with a dietitian. He participates in exercise intermittently. His home blood glucose trend is decreasing steadily. His overall blood glucose range is 140-180 mg/dl. An ACE inhibitor/angiotensin II receptor blocker is being taken. Eye exam is current.  Hypertension This is a chronic problem. The current episode started more than 1 year ago. The problem is uncontrolled. Pertinent negatives include no chest pain, headaches, neck pain, palpitations or shortness of breath. Past treatments include ACE inhibitors, beta blockers and calcium channel blockers. The current treatment provides no improvement. Hypertensive end-organ damage includes CAD/MI and PVD.  Hyperlipidemia This is a chronic problem. The current episode started more than 1 year ago. Pertinent negatives include no chest pain, myalgias or shortness of breath. Current antihyperlipidemic treatment includes statins. Risk factors for coronary artery disease include dyslipidemia, diabetes mellitus, hypertension, male sex, obesity and a sedentary lifestyle.   He is being treated for  DVT of LLE .  Review of Systems  Constitutional: Negative  for fatigue and unexpected weight change.  HENT: Negative for dental problem, mouth sores and trouble swallowing.   Eyes: Negative for visual disturbance.  Respiratory: Negative for cough, choking, chest tightness, shortness of breath and wheezing.   Cardiovascular: Negative for chest pain, palpitations and leg swelling.  Gastrointestinal: Negative for nausea, vomiting, abdominal pain, diarrhea, constipation and abdominal distention.  Endocrine: Negative for polydipsia, polyphagia and polyuria.  Genitourinary: Negative for dysuria, urgency, hematuria and flank pain.  Musculoskeletal: Negative for myalgias, back pain, joint swelling, gait problem and neck pain.  Skin: Negative for pallor, rash and wound.  Neurological: Negative for seizures, syncope, weakness, numbness and headaches.  Psychiatric/Behavioral: Negative for suicidal ideas, hallucinations, confusion and dysphoric mood.    Objective:    BP 139/82 mmHg  Pulse 80  Ht 5\' 8"  (1.727 m)  Wt 252 lb (114.306 kg)  BMI 38.33 kg/m2  SpO2 98%  Wt Readings from Last 3 Encounters:  01/21/16 252 lb (114.306 kg)  10/15/15 259 lb (117.482 kg)  10/15/15 259 lb (117.482 kg)    Physical Exam  Constitutional: He is oriented to person, place,  and time. He appears well-developed and well-nourished. He is cooperative.  HENT:  Head: Normocephalic and atraumatic.  Eyes: EOM are normal.  Neck: Normal range of motion. Neck supple. No tracheal deviation present. No thyromegaly present.  Cardiovascular: Normal rate and normal heart sounds.  Exam reveals no gallop.   No murmur heard. Pulses:      Dorsalis pedis pulses are 0 on the right side, and 0 on the left side.       Posterior tibial pulses are 0 on the right side, and 0 on the left side.  Pulmonary/Chest: Breath sounds normal. No respiratory distress. He has no wheezes.  Abdominal: Soft. Bowel sounds are normal. He exhibits no distension. There is no hepatosplenomegaly. There is no  tenderness. There is no guarding and no CVA tenderness.  Musculoskeletal: He exhibits no edema.       Right shoulder: He exhibits no swelling and no deformity.  Neurological: He is alert and oriented to person, place, and time. He has normal strength and normal reflexes. No cranial nerve deficit or sensory deficit. Gait normal.  Skin: Skin is warm and dry. No rash noted. No cyanosis. Nails show no clubbing.  Psychiatric: He has a normal mood and affect. His speech is normal and behavior is normal. Thought content normal. Cognition and memory are normal.    Results for orders placed or performed in visit on [id number]  Basic metabolic panel  Result Value Ref Range   Sodium 142 135 - 146 mmol/L   Potassium 3.9 3.5 - 5.3 mmol/L   Chloride 104 98 - 110 mmol/L   CO2 27 20 - 31 mmol/L   Glucose, Bld 149 (H) 65 - 99 mg/dL   BUN 15 7 - 25 mg/dL   Creat 0.91 0.70 - 1.18 mg/dL   Calcium 9.0 8.6 - 10.3 mg/dL  Hemoglobin A1c  Result Value Ref Range   Hgb A1c MFr Bld 6.8 (H) <5.7 %   Mean Plasma Glucose 148 mg/dL   Diabetic Labs (most recent): Lab Results  Component Value Date   HGBA1C 6.8* 01/19/2016   HGBA1C 7.1* 10/11/2015   HGBA1C 8.3* 06/17/2015      Assessment & Plan:   1. Type 2 diabetes mellitus with other circulatory complications -He remains at risk for more complications of diabetes. -He came with well controlled A1c 6.8% improved from 8.3% and overall weight loss of 69 pounds since November 2015. Patient is advised to stick to a routine mealtimes to eat 3 meals  a day and avoid unnecessary snacks ( to snack only to correct hypoglycemia). Patient is advised to eliminate simple carbs  from their diet including cakes desserts ice cream soda (  diet and regular) , sweet tea , Candies,  chips and cookies, artificial sweeteners,   and "sugar-free" products .  This will help patient to have stable blood glucose profile and potentially lose weight. Patient is given detailed personalized  glucose monitoring and insulin dosing instructions. Patient is instructed to call back with extremes of blood glucose less than 70 or greater than 300.  Based on his current progress, he will not need insulin for now.   He is  Advised to  Continue   Metformin 1000mg  po BID, and  Invokana 100mg   PO qAM. he is getting  Invokana samples currently from his primary medical doctor, he will call for prescription when he runs out.  2. Hyperlipidemia: I advised him to continue Pravastatin 40 mg po qhs.  3. Obesity Dietitian consult  in progress, He has lost 53 lbs. Encouraged to continue carbs restriction.  4. Essential hypertension Controlled. Continue current meds including ACEI, beta blocker, CCB.  5) vitamin D deficiency: I advised him to continue vitamin D 50,000 units weekly  Follow up plan: Return in about 3 months (around 04/21/2016) for diabetes, high blood pressure, high cholesterol, follow up with pre-visit labs. With meter and logs for f/u.  Glade Lloyd, MD Phone: [phone]  Fax: [phone]   01/21/2016, 9:20 AM

## 2016-01-21 NOTE — Patient Instructions (Signed)

## 2016-01-28 ENCOUNTER — Encounter: Payer: Self-pay | Admitting: Vascular Surgery

## 2016-02-03 ENCOUNTER — Encounter: Payer: Self-pay | Admitting: Vascular Surgery

## 2016-02-03 ENCOUNTER — Ambulatory Visit (HOSPITAL_COMMUNITY)
Admission: RE | Admit: 2016-02-03 | Discharge: 2016-02-03 | Disposition: A | Discharge status: Home / Self Care | Payer: PPO | Source: Ambulatory Visit | Attending: Vascular Surgery | Admitting: Vascular Surgery

## 2016-02-03 ENCOUNTER — Ambulatory Visit (INDEPENDENT_AMBULATORY_CARE_PROVIDER_SITE_OTHER): Payer: PPO | Admitting: Vascular Surgery

## 2016-02-03 VITALS — BP 128/77 | HR 66 | Temp 98.5°F | Resp 18 | Ht 66.0 in | Wt 255.5 lb

## 2016-02-03 DIAGNOSIS — I82891 Chronic embolism and thrombosis of other specified veins: Secondary | ICD-10-CM | POA: Diagnosis not present

## 2016-02-03 DIAGNOSIS — I10 Essential (primary) hypertension: Secondary | ICD-10-CM | POA: Insufficient documentation

## 2016-02-03 DIAGNOSIS — M7989 Other specified soft tissue disorders: Secondary | ICD-10-CM | POA: Insufficient documentation

## 2016-02-03 DIAGNOSIS — E785 Hyperlipidemia, unspecified: Secondary | ICD-10-CM | POA: Insufficient documentation

## 2016-02-03 DIAGNOSIS — I87009 Postthrombotic syndrome without complications of unspecified extremity: Secondary | ICD-10-CM

## 2016-02-03 DIAGNOSIS — I82532 Chronic embolism and thrombosis of left popliteal vein: Secondary | ICD-10-CM | POA: Insufficient documentation

## 2016-02-03 DIAGNOSIS — I82512 Chronic embolism and thrombosis of left femoral vein: Secondary | ICD-10-CM | POA: Insufficient documentation

## 2016-02-03 DIAGNOSIS — E119 Type 2 diabetes mellitus without complications: Secondary | ICD-10-CM | POA: Diagnosis not present

## 2016-02-03 DIAGNOSIS — I8392 Asymptomatic varicose veins of left lower extremity: Secondary | ICD-10-CM | POA: Insufficient documentation

## 2016-02-03 DIAGNOSIS — R609 Edema, unspecified: Secondary | ICD-10-CM | POA: Diagnosis not present

## 2016-02-03 NOTE — Progress Notes (Signed)
Referring Physician: Sinda Du, MD  Patient name: Omar [name] MRN: [id number] DOB: 1945/11/21 Sex: male  REASON FOR CONSULT: Left leg DVT  HPI: Omar [name] is a 70 y.o. male who developed an acute left leg DVT after an airline flight to [place] approximate 6 months ago. He is currently on Eliquis. He denies any prior episodes of DVT before this. He still has some residual swelling in the left leg. However, he states the left leg is much better than it was. It is not really bothersome to him. He does wear compression stockings. He denies history of hypercoagulable state or other left lower extremity trauma. He denies family history of hypercoagulable state. Other medical problems include hyperlipidemia, hypertension, diabetes all of which are currently stable.  Past Medical History  Diagnosis Date  . 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 BPH (benign prostatic hyperplasia)   . Degenerative joint disease   . Meningioma (HCC)     Left frontal; CVA identified by MRI; no neurologic symptoms  . Hyperlipidemia   . Obesity   . Hepatic steatosis   . Abnormal EKG     Inferior Q waves  . Coronary artery disease   . Heart murmur   . Sleep apnea 2008    Dr. Merlene Laughter interpreted the study-CPAP recommended, but refused by patient  . Diabetes mellitus, type 2 (HCC)     No insulin  . Stroke ([place])   . DVT (deep venous thrombosis) [facility])    Past Surgical History  Procedure Laterality Date  . Tonsillectomy    . Ventral hernia repair  2010    umb hernia-AP-went home same day  . Colonoscopy w/ polypectomy  05/2008    polypectomy x4-adenomatous; internal hemorrhoids  . Dupuytren / palmar fasciotomy  2009    rt hand-dsc-went home same day  . Coronary angioplasty with stent placement  06/04/2012      DES    to [place]  . Coronary stent placement    . Left heart catheterization with coronary angiogram N/A 05/28/2012      Procedure: LEFT HEART CATHETERIZATION WITH CORONARY ANGIOGRAM;  Surgeon: Laverda Page, MD;  Location: [facility] CATH LAB;  Service: Cardiovascular;  Laterality: N/A;  . Percutaneous coronary stent intervention (pci-s) N/A 06/04/2012    Procedure: PERCUTANEOUS CORONARY STENT INTERVENTION (PCI-S);  Surgeon: Laverda Page, MD;  Location: [facility] CATH LAB;  Service: Cardiovascular;  Laterality: N/A;    Family History  Problem Relation Age of Onset  . Cancer Mother   . Cancer Father   . Coronary artery disease Neg Hx     SOCIAL HISTORY: Social History   Social History  . Marital Status: Married    Spouse Name: N/A  . Number of Children: N/A  . Years of Education: N/A   Occupational History  . Manufacturing engineer   Social History Main Topics  . Smoking status: Never Smoker   . Smokeless tobacco: Never Used  . Alcohol Use: No  . Drug Use: No  . Sexual Activity: Not Currently   Other Topics Concern  . Not on file   Social History Narrative    No Known Allergies  Current Outpatient Prescriptions  Medication Sig Dispense Refill  . apixaban (ELIQUIS) 5 MG TABS tablet Take 5 mg by mouth 2 (two) times daily.    . canagliflozin (INVOKANA) 100 MG TABS tablet Take  100 mg by mouth daily before breakfast.    . eplerenone (INSPRA) 25 MG tablet Take 25 mg by mouth daily.    Marland Kitchen gabapentin (NEURONTIN) 100 MG capsule TAKE (1) CAPSULE BY MOUTH TWICE DAILY. 60 capsule 2  . glucose blood (ONETOUCH VERIO) test strip Use as instructed 100 each 12  . lisinopril-hydrochlorothiazide (PRINZIDE,ZESTORETIC) 20-25 MG per tablet Take 1 tablet by mouth daily.    . metFORMIN (GLUCOPHAGE) 1000 MG tablet TAKE (1) TABLET BY MOUTH TWICE DAILY. 60 tablet 2  . nebivolol (BYSTOLIC) 10 MG tablet Take 20 mg by mouth daily.    . nitroGLYCERIN (NITROSTAT) 0.4 MG SL tablet Place 0.4 mg under the tongue every 5 (five) minutes as needed for chest pain.    . pravastatin (PRAVACHOL) 40 MG tablet  Take 40 mg by mouth daily.    . Tamsulosin HCl (FLOMAX) 0.4 MG CAPS Take 0.4 mg by mouth daily after breakfast.    . testosterone cypionate (DEPOTESTOTERONE CYPIONATE) 100 MG/ML injection Inject into the muscle every 28 (twenty-eight) days. For IM use only    . traZODone (DESYREL) 100 MG tablet Take 100 mg by mouth at bedtime.    . verapamil (CALAN-SR) 240 MG CR tablet Take 1 tablet (240 mg total) by mouth at bedtime. 90 tablet 0  . Vitamin D, Ergocalciferol, (DRISDOL) 50000 units CAPS capsule TAKE 1 CAPSULE BY MOUTH ONCE A WEEK. 12 capsule 0  . aspirin 81 MG tablet Take 81 mg by mouth daily. Reported on 02/03/2016     No current facility-administered medications for this visit.    ROS:   General:  No weight loss, Fever, chills  HEENT: No recent headaches, no nasal bleeding, no visual changes, no sore throat  Neurologic: No dizziness, blackouts, seizures. No recent symptoms of stroke or mini- stroke. No recent episodes of slurred speech, or temporary blindness.  Cardiac: No recent episodes of chest pain/pressure, no shortness of breath at rest.  + shortness of breath with exertion.  Denies history of atrial fibrillation or irregular heartbeat  Vascular: No history of rest pain in feet.  No history of claudication.  No history of non-healing ulcer, No history of DVT   Pulmonary: No home oxygen, no productive cough, no hemoptysis,  No asthma or wheezing  Musculoskeletal:  [x ] Arthritis, [ ]  Low back pain,  [ ]  Joint pain  Hematologic:No history of hypercoagulable state.  No history of easy bleeding.  No history of anemia  Gastrointestinal: No hematochezia or melena,  No gastroesophageal reflux, no trouble swallowing  Urinary: [ ]  chronic Kidney disease, [ ]  on HD - [ ]  MWF or [ ]  TTHS, [ ]  Burning with urination, [ ]  Frequent urination, [ ]  Difficulty urinating;   Skin: No rashes  Psychological: No history of anxiety,  No history of depression   Physical Examination  Filed  Vitals:   02/03/16 1318  BP: 128/77  Pulse: 66  Temp: 98.5 F (36.9 C)  TempSrc: Oral  Resp: 18  Height: 5\' 6"  (1.676 m)  Weight: 255 lb 8 oz (115.894 kg)  SpO2: 95%    Body mass index is 41.26 kg/(m^2).  General:  Alert and oriented, no acute distress HEENT: Normal Neck: No bruit or JVD Pulmonary: Clear to auscultation bilaterally Cardiac: Regular Rate and Rhythm without murmur Abdomen: Soft, non-tender, non-distended, no mass Skin: No rash Extremity Pulses:  2+ radial, brachial, femoral, dorsalis pedis, posterior tibial pulses bilaterally Musculoskeletal: No deformity trace edema left leg from the knee down  compared to the right  Neurologic: Upper and lower extremity motor 5/5 and symmetric  DATA:  Patient had a left leg venous duplex exam today. This shows resolution of most of the thrombus within his common femoral superficial femoral popliteal veins. There is still some residual thrombus in the calf veins as well. Superficial venous system was widely patent without reflux in the left leg. He has reflux in the deep femoral system.  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 Pathophysiology of the patient's DVT as well as postphlebitic syndrome and venous physiology was explained to the patient and his wife today.   PLAN:  #1 6 total months of anticoagulation with Eliquis. At that point if the patient does not have any significant risk factors for recurrent DVT should be able to stop this. Discussed with patient that he should wear compression stockings daily to prevent skin breakdown and long-term leg swelling in the left leg. He should be on aspirin after his Eliquis is stopped. He has a flight to [place] area near future and discussed with him that he should that point not stop his Eliquis prior to that.  He will follow-up with me on an as-needed basis.   Ruta Hinds, MD [facility] Office:  [phone] Pager: [phone]

## 2016-02-10 ENCOUNTER — Other Ambulatory Visit: Payer: Self-pay | Admitting: "Endocrinology

## 2016-02-17 DIAGNOSIS — H2512 Age-related nuclear cataract, left eye: Secondary | ICD-10-CM | POA: Diagnosis not present

## 2016-02-17 DIAGNOSIS — H538 Other visual disturbances: Secondary | ICD-10-CM | POA: Diagnosis not present

## 2016-02-21 ENCOUNTER — Other Ambulatory Visit: Payer: Self-pay | Admitting: "Endocrinology

## 2016-02-24 DIAGNOSIS — N529 Male erectile dysfunction, unspecified: Secondary | ICD-10-CM | POA: Diagnosis not present

## 2016-03-06 ENCOUNTER — Other Ambulatory Visit: Payer: Self-pay | Admitting: "Endocrinology

## 2016-03-27 NOTE — Patient Instructions (Addendum)
Your procedure is scheduled on: 04/03/2016  Report to [facility] at  700   AM.  Call this number if you have problems the morning of surgery: [phone]   Do not eat food or drink liquids :After Midnight.      Take these medicines the morning of surgery with A SIP OF WATER: inspra, neurontin, lisinopril, bystolic, flomax, verapamil. DO NOT take any medicines for diabetes the morning of your surgery.   Do not wear jewelry, make-up or nail polish.  Do not wear lotions, powders, or perfumes. You may wear deodorant.  Do not shave 48 hours prior to surgery.  Do not bring valuables to the hospital.  Contacts, dentures or bridgework may not be worn into surgery.  Leave suitcase in the car. After surgery it may be brought to your room.  For patients admitted to the hospital, checkout time is 11:00 AM the day of discharge.   Patients discharged the day of surgery will not be allowed to drive home.  :     Please read over the following fact sheets that you were given: Coughing and Deep Breathing, Surgical Site Infection Prevention, Anesthesia Post-op Instructions and Care and Recovery After Surgery    Cataract A cataract is a clouding of the lens of the eye. When a lens becomes cloudy, vision is reduced based on the degree and nature of the clouding. Many cataracts reduce vision to some degree. Some cataracts make people more near-sighted as they develop. Other cataracts increase glare. Cataracts that are ignored and become worse can sometimes look white. The white color can be seen through the pupil. CAUSES   Aging. However, cataracts may occur at any age, even in newborns.   Certain drugs.   Trauma to the eye.   Certain diseases such as diabetes.   Specific eye diseases such as chronic inflammation inside the eye or a sudden attack of a rare form of glaucoma.   Inherited or acquired medical problems.  SYMPTOMS   Gradual, progressive drop in vision in the affected eye.   Severe, rapid  visual loss. This most often happens when trauma is the cause.  DIAGNOSIS  To detect a cataract, an eye doctor examines the lens. Cataracts are best diagnosed with an exam of the eyes with the pupils enlarged (dilated) by drops.  TREATMENT  For an early cataract, vision may improve by using different eyeglasses or stronger lighting. If that does not help your vision, surgery is the only effective treatment. A cataract needs to be surgically removed when vision loss interferes with your everyday activities, such as driving, reading, or watching TV. A cataract may also have to be removed if it prevents examination or treatment of another eye problem. Surgery removes the cloudy lens and usually replaces it with a substitute lens (intraocular lens, IOL).  At a time when both you and your doctor agree, the cataract will be surgically removed. If you have cataracts in both eyes, only one is usually removed at a time. This allows the operated eye to heal and be out of danger from any possible problems after surgery (such as infection or poor wound healing). In rare cases, a cataract may be doing damage to your eye. In these cases, your caregiver may advise surgical removal right away. The vast majority of people who have cataract surgery have better vision afterward. HOME CARE INSTRUCTIONS  If you are not planning surgery, you may be asked to do the following:  Use different eyeglasses.  Use stronger or brighter lighting.   Ask your eye doctor about reducing your medicine dose or changing medicines if it is thought that a medicine caused your cataract. Changing medicines does not make the cataract go away on its own.   Become familiar with your surroundings. Poor vision can lead to injury. Avoid bumping into things on the affected side. You are at a higher risk for tripping or falling.   Exercise extreme care when driving or operating machinery.   Wear sunglasses if you are sensitive to bright light or  experiencing problems with glare.  SEEK IMMEDIATE MEDICAL CARE IF:   You have a worsening or sudden vision loss.   You notice redness, swelling, or increasing pain in the eye.   You have a fever.  Document Released: 09/18/2005 Document Revised: 09/07/2011 Document Reviewed: 05/12/2011 [facility] Patient Information 2012 [place].PATIENT INSTRUCTIONS POST-ANESTHESIA  IMMEDIATELY FOLLOWING SURGERY:  Do not drive or operate machinery for the first twenty four hours after surgery.  Do not make any important decisions for twenty four hours after surgery or while taking narcotic pain medications or sedatives.  If you develop intractable nausea and vomiting or a severe headache please notify your doctor immediately.  FOLLOW-UP:  Please make an appointment with your surgeon as instructed. You do not need to follow up with anesthesia unless specifically instructed to do so.  WOUND CARE INSTRUCTIONS (if applicable):  Keep a dry clean dressing on the anesthesia/puncture wound site if there is drainage.  Once the wound has quit draining you may leave it open to air.  Generally you should leave the bandage intact for twenty four hours unless there is drainage.  If the epidural site drains for more than 36-48 hours please call the anesthesia department.  QUESTIONS?:  Please feel free to call your physician or the hospital operator if you have any questions, and they will be happy to assist you.

## 2016-03-28 ENCOUNTER — Encounter (HOSPITAL_COMMUNITY): Payer: Self-pay

## 2016-03-28 ENCOUNTER — Encounter (HOSPITAL_COMMUNITY)
Admission: RE | Admit: 2016-03-28 | Discharge: 2016-03-28 | Disposition: A | Discharge status: Home / Self Care | Payer: PPO | Source: Ambulatory Visit | Attending: Ophthalmology | Admitting: Ophthalmology

## 2016-03-28 DIAGNOSIS — Z01810 Encounter for preprocedural cardiovascular examination: Secondary | ICD-10-CM | POA: Diagnosis not present

## 2016-03-28 DIAGNOSIS — Z01812 Encounter for preprocedural laboratory examination: Secondary | ICD-10-CM | POA: Diagnosis not present

## 2016-03-28 HISTORY — DX: Polyneuropathy, unspecified: G62.9

## 2016-03-28 LAB — CBC WITH DIFFERENTIAL/PLATELET
BASOS ABS: 0.1 10*3/uL (ref 0.0–0.1)
BASOS PCT: 1 %
EOS ABS: 0.1 10*3/uL (ref 0.0–0.7)
Eosinophils Relative: 1 %
HEMATOCRIT: 47 % (ref 39.0–52.0)
Hemoglobin: 16 g/dL (ref 13.0–17.0)
Lymphocytes Relative: 23 %
Lymphs Abs: 1.7 10*3/uL (ref 0.7–4.0)
MCH: 30.8 pg (ref 26.0–34.0)
MCHC: 34 g/dL (ref 30.0–36.0)
MCV: 90.4 fL (ref 78.0–100.0)
MONO ABS: 0.7 10*3/uL (ref 0.1–1.0)
MONOS PCT: 9 %
NEUTROS ABS: 4.7 10*3/uL (ref 1.7–7.7)
NEUTROS PCT: 66 %
Platelets: 228 10*3/uL (ref 150–400)
RBC: 5.2 MIL/uL (ref 4.22–5.81)
RDW: 14.4 % (ref 11.5–15.5)
WBC: 7.2 10*3/uL (ref 4.0–10.5)

## 2016-03-28 LAB — BASIC METABOLIC PANEL
Anion gap: 7 (ref 5–15)
BUN: 16 mg/dL (ref 6–20)
CALCIUM: 9 mg/dL (ref 8.9–10.3)
CO2: 27 mmol/L (ref 22–32)
CREATININE: 0.86 mg/dL (ref 0.61–1.24)
Chloride: 103 mmol/L (ref 101–111)
Glucose, Bld: 187 mg/dL — ABNORMAL HIGH (ref 65–99)
Potassium: 3.7 mmol/L (ref 3.5–5.1)
SODIUM: 137 mmol/L (ref 135–145)

## 2016-03-28 NOTE — Pre-Procedure Instructions (Signed)
Patient given information to sign up for my chart at home. 

## 2016-03-31 MED ORDER — LIDOCAINE HCL 3.5 % OP GEL
OPHTHALMIC | Status: AC
  Filled 2016-03-31: qty 1

## 2016-03-31 MED ORDER — TETRACAINE HCL 0.5 % OP SOLN
OPHTHALMIC | Status: AC
  Filled 2016-03-31: qty 4

## 2016-03-31 MED ORDER — CYCLOPENTOLATE-PHENYLEPHRINE OP SOLN OPTIME - NO CHARGE
OPHTHALMIC | Status: AC
  Filled 2016-03-31: qty 2

## 2016-04-03 ENCOUNTER — Encounter (HOSPITAL_COMMUNITY)
Admission: RE | Disposition: A | Discharge status: Home / Self Care | Payer: Self-pay | Source: Ambulatory Visit | Attending: Ophthalmology

## 2016-04-03 ENCOUNTER — Ambulatory Visit (HOSPITAL_COMMUNITY): Payer: PPO | Admitting: Anesthesiology

## 2016-04-03 ENCOUNTER — Ambulatory Visit (HOSPITAL_COMMUNITY)
Admission: RE | Admit: 2016-04-03 | Discharge: 2016-04-03 | Disposition: A | Discharge status: Home / Self Care | Payer: PPO | Source: Ambulatory Visit | Attending: Ophthalmology | Admitting: Ophthalmology

## 2016-04-03 ENCOUNTER — Encounter (HOSPITAL_COMMUNITY): Payer: Self-pay | Admitting: *Deleted

## 2016-04-03 DIAGNOSIS — I2510 Atherosclerotic heart disease of native coronary artery without angina pectoris: Secondary | ICD-10-CM | POA: Insufficient documentation

## 2016-04-03 DIAGNOSIS — H269 Unspecified cataract: Secondary | ICD-10-CM | POA: Diagnosis not present

## 2016-04-03 DIAGNOSIS — H2512 Age-related nuclear cataract, left eye: Secondary | ICD-10-CM | POA: Diagnosis not present

## 2016-04-03 DIAGNOSIS — H538 Other visual disturbances: Secondary | ICD-10-CM | POA: Diagnosis not present

## 2016-04-03 DIAGNOSIS — Z7984 Long term (current) use of oral hypoglycemic drugs: Secondary | ICD-10-CM | POA: Diagnosis not present

## 2016-04-03 DIAGNOSIS — G4730 Sleep apnea, unspecified: Secondary | ICD-10-CM | POA: Insufficient documentation

## 2016-04-03 DIAGNOSIS — I10 Essential (primary) hypertension: Secondary | ICD-10-CM | POA: Diagnosis not present

## 2016-04-03 DIAGNOSIS — E119 Type 2 diabetes mellitus without complications: Secondary | ICD-10-CM | POA: Insufficient documentation

## 2016-04-03 HISTORY — PX: CATARACT EXTRACTION W/PHACO: SHX586

## 2016-04-03 LAB — GLUCOSE, CAPILLARY: GLUCOSE-CAPILLARY: 166 mg/dL — AB (ref 65–99)

## 2016-04-03 SURGERY — PHACOEMULSIFICATION, CATARACT, WITH IOL INSERTION
Anesthesia: Monitor Anesthesia Care | Laterality: Left

## 2016-04-03 MED ORDER — FENTANYL CITRATE (PF) 100 MCG/2ML IJ SOLN
25.0000 ug | INTRAMUSCULAR | Status: DC | PRN
  Administered 2016-04-03: 25 ug via INTRAVENOUS

## 2016-04-03 MED ORDER — MIDAZOLAM HCL 2 MG/2ML IJ SOLN
INTRAMUSCULAR | Status: AC
  Filled 2016-04-03: qty 2

## 2016-04-03 MED ORDER — LIDOCAINE HCL 3.5 % OP GEL: 1.0000 "application " | Freq: Once | OPHTHALMIC | Status: DC

## 2016-04-03 MED ORDER — FENTANYL CITRATE (PF) 100 MCG/2ML IJ SOLN
INTRAMUSCULAR | Status: AC
  Filled 2016-04-03: qty 2

## 2016-04-03 MED ORDER — LACTATED RINGERS IV SOLN
INTRAVENOUS | Status: DC
  Administered 2016-04-03: 08:00:00 via INTRAVENOUS

## 2016-04-03 MED ORDER — PHENYLEPHRINE-KETOROLAC 1-0.3 % IO SOLN
INTRAOCULAR | Status: DC | PRN
  Administered 2016-04-03: 500 mL via OPHTHALMIC

## 2016-04-03 MED ORDER — TETRACAINE 0.5 % OP SOLN OPTIME - NO CHARGE
OPHTHALMIC | Status: DC | PRN
  Administered 2016-04-03: 2 [drp] via OPHTHALMIC

## 2016-04-03 MED ORDER — TETRACAINE HCL 0.5 % OP SOLN
1.0000 [drp] | OPHTHALMIC | Status: AC
  Administered 2016-04-03 (×3): 1 [drp] via OPHTHALMIC

## 2016-04-03 MED ORDER — LIDOCAINE HCL 3.5 % OP GEL
OPHTHALMIC | Status: DC | PRN
  Administered 2016-04-03: 1 via OPHTHALMIC

## 2016-04-03 MED ORDER — PHENYLEPHRINE-KETOROLAC 1-0.3 % IO SOLN
INTRAOCULAR | Status: AC
  Filled 2016-04-03: qty 4

## 2016-04-03 MED ORDER — NA HYALUR & NA CHOND-NA HYALUR 0.55-0.5 ML IO KIT
PACK | INTRAOCULAR | Status: DC | PRN
  Administered 2016-04-03: 1 via OPHTHALMIC

## 2016-04-03 MED ORDER — CYCLOPENTOLATE-PHENYLEPHRINE 0.2-1 % OP SOLN
1.0000 [drp] | OPHTHALMIC | Status: AC
  Administered 2016-04-03 (×3): 1 [drp] via OPHTHALMIC

## 2016-04-03 MED ORDER — BSS IO SOLN
INTRAOCULAR | Status: DC | PRN
  Administered 2016-04-03: 15 mL

## 2016-04-03 MED ORDER — MIDAZOLAM HCL 2 MG/2ML IJ SOLN
1.0000 mg | INTRAMUSCULAR | Status: DC | PRN
  Administered 2016-04-03: 2 mg via INTRAVENOUS

## 2016-04-03 MED ORDER — POVIDONE-IODINE 5 % OP SOLN
OPHTHALMIC | Status: DC | PRN
  Administered 2016-04-03: 1 via OPHTHALMIC

## 2016-04-03 SURGICAL SUPPLY — 9 items
CLOTH BEACON ORANGE TIMEOUT ST (SAFETY) ×1 IMPLANT
GLOVE BIOGEL PI IND STRL 6.5 (GLOVE) IMPLANT
GLOVE BIOGEL PI IND STRL 7.0 (GLOVE) IMPLANT
GLOVE BIOGEL PI INDICATOR 6.5 (GLOVE) ×1
GLOVE BIOGEL PI INDICATOR 7.0 (GLOVE) ×1
INST SET CATARACT ~~LOC~~ (KITS) ×2 IMPLANT
LENS ALC ACRYL/TECN (Ophthalmic Related) ×2 IMPLANT
PAD ARMBOARD 7.5X6 YLW CONV (MISCELLANEOUS) ×1 IMPLANT
WATER STERILE IRR 250ML POUR (IV SOLUTION) ×1 IMPLANT

## 2016-04-03 NOTE — Discharge Instructions (Signed)
Omar [name] 04/03/2016 Dr. Iona Hansen Post operative Instructions for Cataract Patients  These instructions are for Omar [name] and pertain to the operative eye.  1.  Resume your normal diet and previous oral medicines.  2. Your Follow-up appointment is at Dr. Iona Hansen' office in [place] on 04/05/16 at 10:15am  3. You may leave the hospital when your driver is present and your nurse releases you.  4. Begin Pred Forte (prednisolone acetate 1%), Acular LS (ketorolac tromethamine .4%) and Gatifloxacin 0.5% eye drops; 1 drop each 4 times daily to operative eye. Begin 3 hours after discharge from Short Stay Unit.  Moxifloxacin 0.5% may be substituted for Gatifloxacin using the same instructions.  32. Page Dr. Iona Hansen via beeper [phone] for significant pain in or around operative eye that is not relieved by Tylenol.  6. If you took Plavix before surgery, restart it at the usual dose on the evening of surgery.  7. Wear dark glasses as necessary for excessive light sensitivity.  8. Do no forcefully rub you your operative eye.  9. Keep your operative eye dry for 1 week. You may gently clean your eyelids with a damp washcloth.  10. You may resume normal occupational activities in one week and resume driving as tolerated after the first post operative visit.  11. It is normal to have blurred vision and a scratchy sensation following surgery.  Dr. Iona HansenQN:[id number]  PATIENT INSTRUCTIONS POST-ANESTHESIA  IMMEDIATELY FOLLOWING SURGERY:  Do not drive or operate machinery for the first twenty four hours after surgery.  Do not make any important decisions for twenty four hours after surgery or while taking narcotic pain medications or sedatives.  If you develop intractable nausea and vomiting or a severe headache please notify your doctor immediately.  FOLLOW-UP:  Please make an appointment with your surgeon as instructed. You do not need to follow up with anesthesia unless specifically instructed to do  so.  WOUND CARE INSTRUCTIONS (if applicable):  Keep a dry clean dressing on the anesthesia/puncture wound site if there is drainage.  Once the wound has quit draining you may leave it open to air.  Generally you should leave the bandage intact for twenty four hours unless there is drainage.  If the epidural site drains for more than 36-48 hours please call the anesthesia department.  QUESTIONS?:  Please feel free to call your physician or the hospital operator if you have any questions, and they will be happy to assist you.

## 2016-04-03 NOTE — Op Note (Signed)
04/03/2016  11:04 AM  PATIENT:  Omar [name]  70 y.o. male  PRE-OPERATIVE DIAGNOSIS:  nuclear cataract left eye  POST-OPERATIVE DIAGNOSIS:  nuclear cataract left eye  PROCEDURE:  Procedure(s): CATARACT EXTRACTION PHACO AND INTRAOCULAR LENS PLACEMENT LEFT EYE CDE=21.76  SURGEON:  Surgeon(s): Williams Che, MD  ASSISTANTS:   Zoila Shutter, CST   ANESTHESIA STAFF: Anesthesiologist: Lerry Liner, MD CRNA: Vista Deck, CRNA  ANESTHESIA:   topical and MAC  REQUESTED LENS POWER: 21.0  LENS IMPLANT INFORMATION:   Alcon SN60WF  +21.0 s/n [id number]  Exp  04/2020  CUMULATIVE DISSIPATED ENERGY:21.76  INDICATIONS:see scanned office H&P  OP FINDINGS:dense NS  COMPLICATIONS:None  PROCEDURE:  The patient was brought to the operating room in good condition.  The operative eye was prepped and draped in the usual fashion for intraocular surgery.  Lidocaine gel was dropped onto the eye.  A 2.4 mm 10 O'clock near clear corneal stepped incision and a 12 O'clock stab incision were created.  Viscoat was instilled into the anterior chamber.  The 5 mm anterior capsulorhexis was performed with a bent needle cystotome and Utrata forceps.  The lens was hydrodissected and hydrodelineated with a cannula and balanced salt solution and rotated with a Kuglen hook.  Phacoemulsification was perfomed in the divide and conquer technique.  The remaining cortex was removed with I&A and the capsular surfaces polished as necessary.  Provisc was placed into the capsular bag and the lens inserted with the Alcon inserter.  The viscoelastic was removed with I&A and the lens "rocked" into position.  The wounds were hydrated and te anterior chamber was refilled with balanced salt solution.  The wounds were checked for leakage and rehydrated as necessary.  The lid speculum and drapes were removed and the patient was transported to short stay in good condition.  PATIENT DISPOSITION:  Short Stay

## 2016-04-03 NOTE — Anesthesia Preprocedure Evaluation (Addendum)
Anesthesia Evaluation  Patient identified by MRN, date of birth, ID band Patient awake    Reviewed: Allergy & Precautions, NPO status , Patient's Chart, lab work & pertinent test results  Airway Mallampati: II  TM Distance: >3 FB     Dental  (+) Teeth Intact   Pulmonary sleep apnea ,    breath sounds clear to auscultation       Cardiovascular hypertension, Pt. on medications + CAD, + Cardiac Stents and + DVT   Rhythm:Regular Rate:Bradycardia     Neuro/Psych CVA, No Residual Symptoms    GI/Hepatic   Endo/Other  diabetes, Type 2, Oral Hypoglycemic Agents  Renal/GU      Musculoskeletal   Abdominal   Peds  Hematology   Anesthesia Other Findings   Reproductive/Obstetrics                            Anesthesia Physical Anesthesia Plan  ASA: III  Anesthesia Plan: MAC   Post-op Pain Management:    Induction: Intravenous  Airway Management Planned: Nasal Cannula  Additional Equipment:   Intra-op Plan:   Post-operative Plan:   Informed Consent: I have reviewed the patients History and Physical, chart, labs and discussed the procedure including the risks, benefits and alternatives for the proposed anesthesia with the patient or authorized representative who has indicated his/her understanding and acceptance.     Plan Discussed with:   Anesthesia Plan Comments:         Anesthesia Quick Evaluation

## 2016-04-03 NOTE — Anesthesia Procedure Notes (Signed)
Procedure Name: MAC Date/Time: 04/03/2016 8:46 AM Performed by: Vista Deck Pre-anesthesia Checklist: Patient identified, Emergency Drugs available, Suction available, Timeout performed and Patient being monitored Patient Re-evaluated:Patient Re-evaluated prior to inductionOxygen Delivery Method: Nasal Cannula

## 2016-04-03 NOTE — Anesthesia Postprocedure Evaluation (Signed)
  Anesthesia Post-op Note  Patient: Omar [name]  Procedure(s) Performed: Procedure(s) (LRB): CATARACT EXTRACTION PHACO AND INTRAOCULAR LENS PLACEMENT LEFT EYE CDE=21.76 (Left)  Patient Location:  Short Stay  Anesthesia Type: MAC  Level of Consciousness: awake  Airway and Oxygen Therapy: Patient Spontanous Breathing  Post-op Pain: none  Post-op Assessment: Post-op Vital signs reviewed, Patient's Cardiovascular Status Stable, Respiratory Function Stable, Patent Airway, No signs of Nausea or vomiting and Pain level controlled  Post-op Vital Signs: Reviewed and stable  Complications: No apparent anesthesia complications

## 2016-04-03 NOTE — Brief Op Note (Signed)
04/03/2016  11:04 AM  PATIENT:  Omar [name]  70 y.o. male  PRE-OPERATIVE DIAGNOSIS:  nuclear cataract left eye  POST-OPERATIVE DIAGNOSIS:  nuclear cataract left eye  PROCEDURE:  Procedure(s): CATARACT EXTRACTION PHACO AND INTRAOCULAR LENS PLACEMENT LEFT EYE CDE=21.76  SURGEON:  Surgeon(s): Williams Che, MD  ASSISTANTS:   Zoila Shutter, CST   ANESTHESIA STAFF: Anesthesiologist: Lerry Liner, MD CRNA: Vista Deck, CRNA  ANESTHESIA:   topical and MAC  REQUESTED LENS POWER: 21.0  LENS IMPLANT INFORMATION:   Alcon SN60WF  +21.0 s/n [id number]  Exp  04/2020  CUMULATIVE DISSIPATED ENERGY:21.76  INDICATIONS:see scanned office H&P  OP FINDINGS:dense NS  COMPLICATIONS:None  DICTATION #: none  PLAN OF CARE: as above  PATIENT DISPOSITION:  Short Stay

## 2016-04-03 NOTE — H&P (Signed)
I have reviewed the pre printed H&P, the patient was re-examined, and I have identified no significant interval changes in the patient's medical condition.  There is no change in the plan of care since the history and physical of record. 

## 2016-04-03 NOTE — Transfer of Care (Signed)
Immediate Anesthesia Transfer of Care Note  Patient: Omar [name]  Procedure(s) Performed: Procedure(s) (LRB): CATARACT EXTRACTION PHACO AND INTRAOCULAR LENS PLACEMENT LEFT EYE CDE=21.76 (Left)  Patient Location: Shortstay  Anesthesia Type: MAC  Level of Consciousness: awake  Airway & Oxygen Therapy: Patient Spontanous Breathing   Post-op Assessment: Report given to PACU RN, Post -op Vital signs reviewed and stable and Patient moving all extremities  Post vital signs: Reviewed and stable  Complications: No apparent anesthesia complications

## 2016-04-13 ENCOUNTER — Encounter (HOSPITAL_COMMUNITY): Payer: Self-pay | Admitting: Ophthalmology

## 2016-04-13 DIAGNOSIS — N529 Male erectile dysfunction, unspecified: Secondary | ICD-10-CM | POA: Diagnosis not present

## 2016-04-17 ENCOUNTER — Other Ambulatory Visit: Payer: Self-pay | Admitting: "Endocrinology

## 2016-04-17 DIAGNOSIS — E785 Hyperlipidemia, unspecified: Secondary | ICD-10-CM | POA: Diagnosis not present

## 2016-04-17 DIAGNOSIS — E1159 Type 2 diabetes mellitus with other circulatory complications: Secondary | ICD-10-CM | POA: Diagnosis not present

## 2016-04-17 LAB — LIPID PANEL
Cholesterol: 168 mg/dL (ref 125–200)
HDL: 55 mg/dL
LDL Cholesterol: 85 mg/dL
Total CHOL/HDL Ratio: 3.1 ratio
Triglycerides: 141 mg/dL
VLDL: 28 mg/dL

## 2016-04-17 LAB — COMPLETE METABOLIC PANEL WITH GFR
ALBUMIN: 3.9 g/dL (ref 3.6–5.1)
ALK PHOS: 57 U/L (ref 40–115)
ALT: 13 U/L (ref 9–46)
AST: 15 U/L (ref 10–35)
BUN: 18 mg/dL (ref 7–25)
CALCIUM: 9.3 mg/dL (ref 8.6–10.3)
CHLORIDE: 102 mmol/L (ref 98–110)
CO2: 26 mmol/L (ref 20–31)
CREATININE: 0.97 mg/dL (ref 0.70–1.18)
GFR, Est Non African American: 78 mL/min (ref >=60)
Glucose, Bld: 134 mg/dL — ABNORMAL HIGH (ref 65–99)
POTASSIUM: 4.3 mmol/L (ref 3.5–5.3)
Sodium: 141 mmol/L (ref 135–146)
Total Bilirubin: 0.5 mg/dL (ref 0.2–1.2)
Total Protein: 6.2 g/dL (ref 6.1–8.1)

## 2016-04-17 LAB — HEMOGLOBIN A1C
Hgb A1c MFr Bld: 7.3 % — ABNORMAL HIGH (ref <5.7)
MEAN PLASMA GLUCOSE: 163 mg/dL

## 2016-04-21 ENCOUNTER — Encounter: Payer: Self-pay | Admitting: "Endocrinology

## 2016-04-21 ENCOUNTER — Ambulatory Visit (INDEPENDENT_AMBULATORY_CARE_PROVIDER_SITE_OTHER): Payer: PPO | Admitting: "Endocrinology

## 2016-04-21 VITALS — BP 130/74 | HR 60 | Resp 18 | Ht 68.0 in | Wt 257.0 lb

## 2016-04-21 DIAGNOSIS — E785 Hyperlipidemia, unspecified: Secondary | ICD-10-CM | POA: Diagnosis not present

## 2016-04-21 DIAGNOSIS — I10 Essential (primary) hypertension: Secondary | ICD-10-CM

## 2016-04-21 DIAGNOSIS — E1159 Type 2 diabetes mellitus with other circulatory complications: Secondary | ICD-10-CM

## 2016-04-21 DIAGNOSIS — E669 Obesity, unspecified: Secondary | ICD-10-CM | POA: Diagnosis not present

## 2016-04-21 NOTE — Progress Notes (Signed)
Subjective:    Patient ID: Omar [name], male    DOB: 01/09/1946,    Past Medical History  Diagnosis Date  . 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 BPH (benign prostatic hyperplasia)   . Degenerative joint disease   . Meningioma (HCC)     Left frontal; CVA identified by MRI; no neurologic symptoms  . Hyperlipidemia   . Obesity   . Hepatic steatosis   . Abnormal EKG     Inferior Q waves  . Coronary artery disease   . Heart murmur   . Diabetes mellitus, type 2 (HCC)     No insulin  . DVT (deep venous thrombosis) ([place])   . Stroke [facility])     no deficits from stroke, "Didnt know I had one when they say I did".  . Sleep apnea 2008    Dr. Merlene Laughter interpreted the study-CPAP recommended, but refused by patient  . Neuropathy [facility])    Past Surgical History  Procedure Laterality Date  . Tonsillectomy    . Ventral hernia repair  2010    umb hernia-AP-went home same day  . Colonoscopy w/ polypectomy  05/2008    polypectomy x4-adenomatous; internal hemorrhoids  . Dupuytren / palmar fasciotomy  2009    rt hand-dsc-went home same day x2  . Coronary angioplasty with stent placement  06/04/2012      DES    to [place]  . Coronary stent placement    . Left heart catheterization with coronary angiogram N/A 05/28/2012    Procedure: LEFT HEART CATHETERIZATION WITH CORONARY ANGIOGRAM;  Surgeon: Laverda Page, MD;  Location: [facility] CATH LAB;  Service: Cardiovascular;  Laterality: N/A;  . Percutaneous coronary stent intervention (pci-s) N/A 06/04/2012    Procedure: PERCUTANEOUS CORONARY STENT INTERVENTION (PCI-S);  Surgeon: Laverda Page, MD;  Location: [facility] CATH LAB;  Service: Cardiovascular;  Laterality: N/A;  . Cataract extraction w/phaco Left 04/03/2016    Procedure: CATARACT EXTRACTION PHACO AND INTRAOCULAR LENS PLACEMENT LEFT EYE CDE=21.76;  Surgeon: Williams Che, MD;  Location: [facility] ORS;  Service: Ophthalmology;   Laterality: Left;   Social History   Social History  . Marital Status: Married    Spouse Name: N/A  . Number of Children: N/A  . Years of Education: N/A   Occupational History  . Manufacturing engineer   Social History Main Topics  . Smoking status: Never Smoker   . Smokeless tobacco: Never Used  . Alcohol Use: No  . Drug Use: No  . Sexual Activity: Not Currently    Birth Control/ Protection: None   Other Topics Concern  . None   Social History Narrative   Outpatient Encounter Prescriptions as of 04/21/2016  Medication Sig  . apixaban (ELIQUIS) 5 MG TABS tablet Take 5 mg by mouth 2 (two) times daily.  . canagliflozin (INVOKANA) 100 MG TABS tablet Take 100 mg by mouth daily before breakfast.  . eplerenone (INSPRA) 25 MG tablet Take 25 mg by mouth daily.  Marland Kitchen gabapentin (NEURONTIN) 100 MG capsule TAKE (1) CAPSULE BY MOUTH TWICE DAILY.  Marland Kitchen glucose blood (ONETOUCH VERIO) test strip Use as instructed  . lisinopril-hydrochlorothiazide (PRINZIDE,ZESTORETIC) 20-25 MG per tablet Take 1 tablet by mouth daily.  . metFORMIN (GLUCOPHAGE) 1000 MG tablet TAKE (1) TABLET BY MOUTH TWICE DAILY.  . Multiple Vitamins-Minerals (ONE-A-DAY 50 PLUS PO) Take 1 tablet by mouth daily.  Marland Kitchen  nebivolol (BYSTOLIC) 10 MG tablet Take 20 mg by mouth daily.  . nitroGLYCERIN (NITROSTAT) 0.4 MG SL tablet Place 0.4 mg under the tongue every 5 (five) minutes as needed for chest pain.  . pravastatin (PRAVACHOL) 40 MG tablet Take 40 mg by mouth daily.  . Tamsulosin HCl (FLOMAX) 0.4 MG CAPS Take 0.4 mg by mouth daily after breakfast.  . testosterone cypionate (DEPOTESTOTERONE CYPIONATE) 100 MG/ML injection Inject into the muscle every 28 (twenty-eight) days. For IM use only  . traZODone (DESYREL) 100 MG tablet Take 100 mg by mouth at bedtime.  . verapamil (CALAN-SR) 240 MG CR tablet Take 1 tablet (240 mg total) by mouth at bedtime.  . Vitamin D, Ergocalciferol, (DRISDOL) 50000 units CAPS capsule TAKE  1 CAPSULE BY MOUTH ONCE A WEEK.   No facility-administered encounter medications on file as of 04/21/2016.   ALLERGIES: No Known Allergies VACCINATION STATUS:  There is no immunization history on file for this patient.  Diabetes He presents for his follow-up diabetic visit. He has type 2 diabetes mellitus. The initial diagnosis of diabetes was made 16 years ago. His disease course has been worsening. There are no hypoglycemic associated symptoms. Pertinent negatives for hypoglycemia include no confusion, headaches, pallor or seizures. Pertinent negatives for diabetes include no chest pain, no fatigue, no polydipsia, no polyphagia, no polyuria and no weakness. Symptoms are worsening. Diabetic complications include heart disease and PVD. Risk factors for coronary artery disease include diabetes mellitus, dyslipidemia, obesity, male sex, hypertension and sedentary lifestyle. Current diabetic treatment includes oral agent (monotherapy). He is compliant with treatment some of the time. His weight is increasing steadily (Overall he lost 69 pounds from November 2015, now regaining 5 pounds since last visit.). He is following a diabetic diet. He has had a previous visit with a dietitian. He participates in exercise intermittently. His home blood glucose trend is decreasing steadily. His overall blood glucose range is 140-180 mg/dl. An ACE inhibitor/angiotensin II receptor blocker is being taken. Eye exam is current.  Hypertension This is a chronic problem. The current episode started more than 1 year ago. The problem is uncontrolled. Pertinent negatives include no chest pain, headaches, neck pain, palpitations or shortness of breath. Past treatments include ACE inhibitors, beta blockers and calcium channel blockers. The current treatment provides no improvement. Hypertensive end-organ damage includes CAD/MI and PVD.  Hyperlipidemia This is a chronic problem. The current episode started more than 1 year ago.  Pertinent negatives include no chest pain, myalgias or shortness of breath. Current antihyperlipidemic treatment includes statins. Risk factors for coronary artery disease include dyslipidemia, diabetes mellitus, hypertension, male sex, obesity and a sedentary lifestyle.   He is being treated for  DVT of LLE .  Review of Systems  Constitutional: Negative for fatigue and unexpected weight change.  HENT: Negative for dental problem, mouth sores and trouble swallowing.   Eyes: Negative for visual disturbance.  Respiratory: Negative for cough, choking, chest tightness, shortness of breath and wheezing.   Cardiovascular: Negative for chest pain, palpitations and leg swelling.  Gastrointestinal: Negative for nausea, vomiting, abdominal pain, diarrhea, constipation and abdominal distention.  Endocrine: Negative for polydipsia, polyphagia and polyuria.  Genitourinary: Negative for dysuria, urgency, hematuria and flank pain.  Musculoskeletal: Negative for myalgias, back pain, joint swelling, gait problem and neck pain.  Skin: Negative for pallor, rash and wound.  Neurological: Negative for seizures, syncope, weakness, numbness and headaches.  Psychiatric/Behavioral: Negative for suicidal ideas, hallucinations, confusion and dysphoric mood.    Objective:  BP 130/74 mmHg  Pulse 60  Resp 18  Ht 5\' 8"  (1.727 m)  Wt 257 lb (116.574 kg)  BMI 39.09 kg/m2  SpO2 95%  Wt Readings from Last 3 Encounters:  04/21/16 257 lb (116.574 kg)  04/03/16 257 lb (116.574 kg)  03/28/16 257 lb (116.574 kg)    Physical Exam  Constitutional: He is oriented to person, place, and time. He appears well-developed and well-nourished. He is cooperative.  HENT:  Head: Normocephalic and atraumatic.  Eyes: EOM are normal.  Neck: Normal range of motion. Neck supple. No tracheal deviation present. No thyromegaly present.  Cardiovascular: Normal rate and normal heart sounds.  Exam reveals no gallop.   No murmur  heard. Pulses:      Dorsalis pedis pulses are 0 on the right side, and 0 on the left side.       Posterior tibial pulses are 0 on the right side, and 0 on the left side.  Pulmonary/Chest: Breath sounds normal. No respiratory distress. He has no wheezes.  Abdominal: Soft. Bowel sounds are normal. He exhibits no distension. There is no hepatosplenomegaly. There is no tenderness. There is no guarding and no CVA tenderness.  Musculoskeletal: He exhibits no edema.       Right shoulder: He exhibits no swelling and no deformity.  Neurological: He is alert and oriented to person, place, and time. He has normal strength and normal reflexes. No cranial nerve deficit or sensory deficit. Gait normal.  Skin: Skin is warm and dry. No rash noted. No cyanosis. Nails show no clubbing.  Psychiatric: He has a normal mood and affect. His speech is normal and behavior is normal. Thought content normal. Cognition and memory are normal.    Results for orders placed or performed in visit on 04/17/16  COMPLETE METABOLIC PANEL WITH GFR  Result Value Ref Range   Sodium 141 135 - 146 mmol/L   Potassium 4.3 3.5 - 5.3 mmol/L   Chloride 102 98 - 110 mmol/L   CO2 26 20 - 31 mmol/L   Glucose, Bld 134 (H) 65 - 99 mg/dL   BUN 18 7 - 25 mg/dL   Creat 0.97 0.70 - 1.18 mg/dL   Total Bilirubin 0.5 0.2 - 1.2 mg/dL   Alkaline Phosphatase 57 40 - 115 U/L   AST 15 10 - 35 U/L   ALT 13 9 - 46 U/L   Total Protein 6.2 6.1 - 8.1 g/dL   Albumin 3.9 3.6 - 5.1 g/dL   Calcium 9.3 8.6 - 10.3 mg/dL   GFR, Est African American >89 >=60 mL/min   GFR, Est Non African American 78 >=60 mL/min  Lipid panel  Result Value Ref Range   Cholesterol 168 125 - 200 mg/dL   Triglycerides 141 <150 mg/dL   HDL 55 >=40 mg/dL   Total CHOL/HDL Ratio 3.1 <=5.0 Ratio   VLDL 28 <30 mg/dL   LDL Cholesterol 85 <130 mg/dL  Hemoglobin A1c  Result Value Ref Range   Hgb A1c MFr Bld 7.3 (H) <5.7 %   Mean Plasma Glucose 163 mg/dL   Diabetic Labs (most  recent): Lab Results  Component Value Date   HGBA1C 7.3* 04/17/2016   HGBA1C 6.8* 01/19/2016   HGBA1C 7.1* 10/11/2015      Assessment & Plan:   1. Type 2 diabetes mellitus with other circulatory complications -He remains at risk for more complications of diabetes. -He came with Slight increase in his A1c is to 7.3% from   6.8% . -  This is mainly driven by his indiscretion since last visit gaining 5 pounds after he lost 69 pounds since November of thousand 15.   Patient is advised to stick to a routine mealtimes to eat 3 meals  a day and avoid unnecessary snacks ( to snack only to correct hypoglycemia). Patient is advised to eliminate simple carbs  from their diet including cakes desserts ice cream soda (  diet and regular) , sweet tea , Candies,  chips and cookies, artificial sweeteners,   and "sugar-free" products .  This will help patient to have stable blood glucose profile and potentially lose weight. Patient is given detailed personalized glucose monitoring and insulin dosing instructions. Patient is instructed to call back with extremes of blood glucose less than 70 or greater than 300.  Based on his current progress, he will not need insulin for now.   He is  advised to  Continue   Metformin 1000mg  po BID, and  Invokana 100mg   PO qAM. he is getting  Invokana samples currently from his primary medical doctor, he will call for prescription when he runs out. -If he loses more control, he would be considered for incretin therapy.  2. Hyperlipidemia: I advised him to continue Pravastatin 40 mg po qhs.  3. Obesity Dietitian consult in progress, He has lost 53 lbs. Encouraged to continue carbs restriction.  4. Essential hypertension Controlled. Continue current meds including ACEI, beta blocker, CCB.  5) vitamin D deficiency: I advised him to continue vitamin D 50,000 units weekly  Follow up plan: Return in about 3 months (around 07/22/2016) for follow up with pre-visit  labs. With meter and logs for f/u.  Glade Lloyd, MD Phone: [phone]  Fax: [phone]   04/21/2016, 1:32 PM

## 2016-04-21 NOTE — Patient Instructions (Signed)

## 2016-05-09 DIAGNOSIS — N529 Male erectile dysfunction, unspecified: Secondary | ICD-10-CM | POA: Diagnosis not present

## 2016-05-16 DIAGNOSIS — H538 Other visual disturbances: Secondary | ICD-10-CM | POA: Diagnosis not present

## 2016-05-16 DIAGNOSIS — H2511 Age-related nuclear cataract, right eye: Secondary | ICD-10-CM | POA: Diagnosis not present

## 2016-06-03 ENCOUNTER — Other Ambulatory Visit: Payer: Self-pay | Admitting: "Endocrinology

## 2016-06-12 ENCOUNTER — Other Ambulatory Visit: Payer: Self-pay | Admitting: "Endocrinology

## 2016-06-12 NOTE — Patient Instructions (Signed)
Omar [name]  06/12/2016     @PREFPERIOPPHARMACY @   Your procedure is scheduled on 06/19/2016.  Report to [facility] at 7:50 A.M.  Call this number if you have problems the morning of surgery:  [phone]   Remember:  Do not eat food or drink liquids after midnight.  Take these medicines the morning of surgery with A SIP OF WATER Inspra, Gabapentin, Bystolic, Flomax, Verapamil  DO NOT TAKE DIABETIC MEDICATIONS AM OF PROCEDURE   Do not wear jewelry, make-up or nail polish.  Do not wear lotions, powders, or perfumes, or deoderant.  Do not shave 48 hours prior to surgery.  Men may shave face and neck.  Do not bring valuables to the hospital.  [facility] is not responsible for any belongings or valuables.  Contacts, dentures or bridgework may not be worn into surgery.  Leave your suitcase in the car.  After surgery it may be brought to your room.  For patients admitted to the hospital, discharge time will be determined by your treatment team.  Patients discharged the day of surgery will not be allowed to drive home.    Please read over the following fact sheets that you were given. Anesthesia Post-op Instructions     PATIENT INSTRUCTIONS POST-ANESTHESIA  IMMEDIATELY FOLLOWING SURGERY:  Do not drive or operate machinery for the first twenty four hours after surgery.  Do not make any important decisions for twenty four hours after surgery or while taking narcotic pain medications or sedatives.  If you develop intractable nausea and vomiting or a severe headache please notify your doctor immediately.  FOLLOW-UP:  Please make an appointment with your surgeon as instructed. You do not need to follow up with anesthesia unless specifically instructed to do so.  WOUND CARE INSTRUCTIONS (if applicable):  Keep a dry clean dressing on the anesthesia/puncture wound site if there is drainage.  Once the wound has quit draining you may leave it open to air.  Generally you should leave  the bandage intact for twenty four hours unless there is drainage.  If the epidural site drains for more than 36-48 hours please call the anesthesia department.  QUESTIONS?:  Please feel free to call your physician or the hospital operator if you have any questions, and they will be happy to assist you.      Cataract A cataract is a clouding of the lens of the eye. When a lens becomes cloudy, vision is reduced based on the degree and nature of the clouding. Many cataracts reduce vision to some degree. Some cataracts make people more near-sighted as they develop. Other cataracts increase glare. Cataracts that are ignored and become worse can sometimes look white. The white color can be seen through the pupil. CAUSES   Aging. However, cataracts may occur at any age, even in newborns.  Certain drugs.  Trauma to the eye.  Certain diseases such as diabetes.  Specific eye diseases such as chronic inflammation inside the eye or a sudden attack of a rare form of glaucoma.  Inherited or acquired medical problems. SYMPTOMS   Gradual, progressive drop in vision in the affected eye.  Severe, rapid visual loss. This most often happens when trauma is the cause. DIAGNOSIS  To detect a cataract, an eye doctor examines the lens. Cataracts are best diagnosed with an exam of the eyes with the pupils enlarged (dilated) by drops.  TREATMENT  For an early cataract, vision may improve by using different eyeglasses or stronger lighting. If  that does not help your vision, surgery is the only effective treatment. A cataract needs to be surgically removed when vision loss interferes with your everyday activities, such as driving, reading, or watching TV. A cataract may also have to be removed if it prevents examination or treatment of another eye problem. Surgery removes the cloudy lens and usually replaces it with a substitute lens (intraocular lens, IOL).  At a time when both you and your doctor agree, the  cataract will be surgically removed. If you have cataracts in both eyes, only one is usually removed at a time. This allows the operated eye to heal and be out of danger from any possible problems after surgery (such as infection or poor wound healing). In rare cases, a cataract may be doing damage to your eye. In these cases, your caregiver may advise surgical removal right away. The vast majority of people who have cataract surgery have better vision afterward. HOME CARE INSTRUCTIONS  If you are not planning surgery, you may be asked to do the following:  Use different eyeglasses.  Use stronger or brighter lighting.  Ask your eye doctor about reducing your medicine dose or changing medicines if it is thought that a medicine caused your cataract. Changing medicines does not make the cataract go away on its own.  Become familiar with your surroundings. Poor vision can lead to injury. Avoid bumping into things on the affected side. You are at a higher risk for tripping or falling.  Exercise extreme care when driving or operating machinery.  Wear sunglasses if you are sensitive to bright light or experiencing problems with glare. SEEK IMMEDIATE MEDICAL CARE IF:   You have a worsening or sudden vision loss.  You notice redness, swelling, or increasing pain in the eye.  You have a fever.   This information is not intended to replace advice given to you by your health care provider. Make sure you discuss any questions you have with your health care provider.   Document Released: 09/18/2005 Document Revised: 12/11/2011 Document Reviewed: 03/24/2015 Elsevier Interactive Patient Education [employer].

## 2016-06-13 ENCOUNTER — Encounter (HOSPITAL_COMMUNITY)
Admission: RE | Admit: 2016-06-13 | Discharge: 2016-06-13 | Disposition: A | Discharge status: Home / Self Care | Payer: PPO | Source: Ambulatory Visit | Attending: Ophthalmology | Admitting: Ophthalmology

## 2016-06-13 ENCOUNTER — Encounter (HOSPITAL_COMMUNITY): Payer: Self-pay

## 2016-06-13 DIAGNOSIS — H2511 Age-related nuclear cataract, right eye: Secondary | ICD-10-CM | POA: Insufficient documentation

## 2016-06-13 DIAGNOSIS — Z01812 Encounter for preprocedural laboratory examination: Secondary | ICD-10-CM | POA: Diagnosis not present

## 2016-06-13 LAB — CBC
HCT: 48.5 % (ref 39.0–52.0)
Hemoglobin: 16.2 g/dL (ref 13.0–17.0)
MCH: 30.4 pg (ref 26.0–34.0)
MCHC: 33.4 g/dL (ref 30.0–36.0)
MCV: 91 fL (ref 78.0–100.0)
Platelets: 253 K/uL (ref 150–400)
RBC: 5.33 MIL/uL (ref 4.22–5.81)
RDW: 14.4 % (ref 11.5–15.5)
WBC: 7.8 K/uL (ref 4.0–10.5)

## 2016-06-13 LAB — BASIC METABOLIC PANEL WITH GFR
Anion gap: 12 (ref 5–15)
BUN: 18 mg/dL (ref 6–20)
CO2: 27 mmol/L (ref 22–32)
Calcium: 9.5 mg/dL (ref 8.9–10.3)
Chloride: 101 mmol/L (ref 101–111)
Creatinine, Ser: 0.9 mg/dL (ref 0.61–1.24)
GFR calc Af Amer: >60 mL/min
GFR calc non Af Amer: >60 mL/min
Glucose, Bld: 183 mg/dL — ABNORMAL HIGH (ref 65–99)
Potassium: 3.8 mmol/L (ref 3.5–5.1)
Sodium: 140 mmol/L (ref 135–145)

## 2016-06-19 ENCOUNTER — Ambulatory Visit (HOSPITAL_COMMUNITY): Payer: PPO | Admitting: Anesthesiology

## 2016-06-19 ENCOUNTER — Ambulatory Visit (HOSPITAL_COMMUNITY)
Admission: RE | Admit: 2016-06-19 | Discharge: 2016-06-19 | Disposition: A | Discharge status: Home / Self Care | Payer: PPO | Source: Ambulatory Visit | Attending: Ophthalmology | Admitting: Ophthalmology

## 2016-06-19 ENCOUNTER — Encounter (HOSPITAL_COMMUNITY)
Admission: RE | Disposition: A | Discharge status: Home / Self Care | Payer: Self-pay | Source: Ambulatory Visit | Attending: Ophthalmology

## 2016-06-19 ENCOUNTER — Encounter (HOSPITAL_COMMUNITY): Payer: Self-pay | Admitting: *Deleted

## 2016-06-19 DIAGNOSIS — E119 Type 2 diabetes mellitus without complications: Secondary | ICD-10-CM | POA: Insufficient documentation

## 2016-06-19 DIAGNOSIS — H538 Other visual disturbances: Secondary | ICD-10-CM | POA: Diagnosis not present

## 2016-06-19 DIAGNOSIS — Z955 Presence of coronary angioplasty implant and graft: Secondary | ICD-10-CM | POA: Diagnosis not present

## 2016-06-19 DIAGNOSIS — Z7984 Long term (current) use of oral hypoglycemic drugs: Secondary | ICD-10-CM | POA: Insufficient documentation

## 2016-06-19 DIAGNOSIS — Z86718 Personal history of other venous thrombosis and embolism: Secondary | ICD-10-CM | POA: Diagnosis not present

## 2016-06-19 DIAGNOSIS — I10 Essential (primary) hypertension: Secondary | ICD-10-CM | POA: Diagnosis not present

## 2016-06-19 DIAGNOSIS — G4730 Sleep apnea, unspecified: Secondary | ICD-10-CM | POA: Insufficient documentation

## 2016-06-19 DIAGNOSIS — Z8673 Personal history of transient ischemic attack (TIA), and cerebral infarction without residual deficits: Secondary | ICD-10-CM | POA: Diagnosis not present

## 2016-06-19 DIAGNOSIS — H269 Unspecified cataract: Secondary | ICD-10-CM | POA: Diagnosis not present

## 2016-06-19 DIAGNOSIS — I2510 Atherosclerotic heart disease of native coronary artery without angina pectoris: Secondary | ICD-10-CM | POA: Insufficient documentation

## 2016-06-19 DIAGNOSIS — H2511 Age-related nuclear cataract, right eye: Secondary | ICD-10-CM | POA: Diagnosis not present

## 2016-06-19 HISTORY — PX: CATARACT EXTRACTION W/PHACO: SHX586

## 2016-06-19 LAB — GLUCOSE, CAPILLARY: Glucose-Capillary: 151 mg/dL — ABNORMAL HIGH (ref 65–99)

## 2016-06-19 SURGERY — PHACOEMULSIFICATION, CATARACT, WITH IOL INSERTION
Anesthesia: Monitor Anesthesia Care | Site: Eye | Laterality: Right

## 2016-06-19 MED ORDER — LIDOCAINE HCL 3.5 % OP GEL: 1.0000 "application " | Freq: Once | OPHTHALMIC | Status: DC

## 2016-06-19 MED ORDER — TETRACAINE 0.5 % OP SOLN OPTIME - NO CHARGE
OPHTHALMIC | Status: DC | PRN
  Administered 2016-06-19: 1 [drp] via OPHTHALMIC

## 2016-06-19 MED ORDER — TETRACAINE HCL 0.5 % OP SOLN
1.0000 [drp] | OPHTHALMIC | Status: AC
  Administered 2016-06-19 (×3): 1 [drp] via OPHTHALMIC

## 2016-06-19 MED ORDER — LACTATED RINGERS IV SOLN
INTRAVENOUS | Status: DC
  Administered 2016-06-19: 1000 mL via INTRAVENOUS

## 2016-06-19 MED ORDER — MIDAZOLAM HCL 2 MG/2ML IJ SOLN
1.0000 mg | INTRAMUSCULAR | Status: DC | PRN
  Administered 2016-06-19: 2 mg via INTRAVENOUS

## 2016-06-19 MED ORDER — PHENYLEPHRINE-KETOROLAC 1-0.3 % IO SOLN
INTRAOCULAR | Status: AC
  Filled 2016-06-19: qty 4

## 2016-06-19 MED ORDER — FENTANYL CITRATE (PF) 100 MCG/2ML IJ SOLN
INTRAMUSCULAR | Status: AC
  Filled 2016-06-19: qty 2

## 2016-06-19 MED ORDER — BSS IO SOLN
INTRAOCULAR | Status: DC | PRN
  Administered 2016-06-19: 15 mL via INTRAOCULAR

## 2016-06-19 MED ORDER — PHENYLEPHRINE-KETOROLAC 1-0.3 % IO SOLN
INTRAOCULAR | Status: DC | PRN
  Administered 2016-06-19: 500 mL via OPHTHALMIC

## 2016-06-19 MED ORDER — LIDOCAINE HCL 3.5 % OP GEL
OPHTHALMIC | Status: DC | PRN
  Administered 2016-06-19: 1 via OPHTHALMIC

## 2016-06-19 MED ORDER — FENTANYL CITRATE (PF) 100 MCG/2ML IJ SOLN
25.0000 ug | INTRAMUSCULAR | Status: AC | PRN
  Administered 2016-06-19 (×2): 25 ug via INTRAVENOUS

## 2016-06-19 MED ORDER — POVIDONE-IODINE 5 % OP SOLN
OPHTHALMIC | Status: DC | PRN
  Administered 2016-06-19: 1 via OPHTHALMIC

## 2016-06-19 MED ORDER — MIDAZOLAM HCL 2 MG/2ML IJ SOLN
INTRAMUSCULAR | Status: AC
  Filled 2016-06-19: qty 2

## 2016-06-19 MED ORDER — CYCLOPENTOLATE-PHENYLEPHRINE 0.2-1 % OP SOLN
1.0000 [drp] | OPHTHALMIC | Status: AC | PRN
  Administered 2016-06-19 (×3): 1 [drp] via OPHTHALMIC

## 2016-06-19 MED ORDER — NA HYALUR & NA CHOND-NA HYALUR 0.55-0.5 ML IO KIT
PACK | INTRAOCULAR | Status: DC | PRN
  Administered 2016-06-19: 1 via OPHTHALMIC

## 2016-06-19 SURGICAL SUPPLY — 10 items
CLOTH BEACON ORANGE TIMEOUT ST (SAFETY) ×2 IMPLANT
GLOVE BIOGEL PI IND STRL 6.5 (GLOVE) IMPLANT
GLOVE BIOGEL PI IND STRL 7.0 (GLOVE) IMPLANT
GLOVE BIOGEL PI INDICATOR 6.5 (GLOVE) ×2
GLOVE BIOGEL PI INDICATOR 7.0 (GLOVE) ×2
GLOVE EXAM NITRILE MD LF STRL (GLOVE) ×2 IMPLANT
INST SET CATARACT ~~LOC~~ (KITS) ×3 IMPLANT
LENS ALC ACRYL/TECN (Ophthalmic Related) ×3 IMPLANT
PAD ARMBOARD 7.5X6 YLW CONV (MISCELLANEOUS) ×2 IMPLANT
WATER STERILE IRR 250ML POUR (IV SOLUTION) ×2 IMPLANT

## 2016-06-19 NOTE — Discharge Instructions (Signed)

## 2016-06-19 NOTE — Brief Op Note (Signed)
06/19/2016  10:05 AM  PATIENT:  Omar [name]  70 y.o. male  PRE-OPERATIVE DIAGNOSIS:  nuclear cataract right eye  POST-OPERATIVE DIAGNOSIS:  nuclear cataract right eye  PROCEDURE:  Procedure(s): CATARACT EXTRACTION PHACO AND INTRAOCULAR LENS PLACEMENT; CDE:  11.56  SURGEON:  Surgeon(s): Williams Che, MD  ASSISTANTS: Bonney Roussel, CST   ANESTHESIA STAFF: Anesthesiologist: Lerry Liner, MD CRNA: Ollen Bowl, CRNA  ANESTHESIA:   topical and MAC  REQUESTED LENS POWER: 21.5  LENS IMPLANT INFORMATION:  Alcon [id number]  S/n [id number]  Exp 06/2020  CUMULATIVE DISSIPATED ENERGY:11.56  INDICATIONS:see scanned office H&P for details  OP FINDINGS:dense NS  COMPLICATIONS:None  DICTATION #: none  PLAN OF CARE: as above  PATIENT DISPOSITION:  Short Stay

## 2016-06-19 NOTE — Anesthesia Postprocedure Evaluation (Signed)
Anesthesia Post Note  Patient: Omar [name]  Procedure(s) Performed: Procedure(s) (LRB): CATARACT EXTRACTION PHACO AND INTRAOCULAR LENS PLACEMENT; CDE:  11.56 (Right)  Patient location during evaluation: Short Stay Anesthesia Type: MAC Level of consciousness: awake and alert and oriented Pain management: pain level controlled Vital Signs Assessment: post-procedure vital signs reviewed and stable Respiratory status: spontaneous breathing Cardiovascular status: blood pressure returned to baseline Postop Assessment: no signs of nausea or vomiting Anesthetic complications: no    Last Vitals:  Vitals:   06/19/16 0915 06/19/16 0920  BP: 124/69 136/83  Pulse:    Resp: 16 13  Temp:      Last Pain:  Vitals:   06/19/16 0840  TempSrc: Oral                 Ferrari,Miquela Costabile

## 2016-06-19 NOTE — H&P (Signed)
I have reviewed the pre printed H&P, the patient was re-examined, and I have identified no significant interval changes in the patient's medical condition.  There is no change in the plan of care since the history and physical of record. 

## 2016-06-19 NOTE — Transfer of Care (Signed)
Immediate Anesthesia Transfer of Care Note  Patient: Omar [name]  Procedure(s) Performed: Procedure(s): CATARACT EXTRACTION PHACO AND INTRAOCULAR LENS PLACEMENT; CDE:  11.56 (Right)  Patient Location: Short Stay  Anesthesia Type:MAC  Level of Consciousness: awake  Airway & Oxygen Therapy: Patient Spontanous Breathing  Post-op Assessment: Report given to RN  Post vital signs: Reviewed  Last Vitals:  Vitals:   06/19/16 0915 06/19/16 0920  BP: 124/69 136/83  Pulse:    Resp: 16 13  Temp:      Last Pain:  Vitals:   06/19/16 0840  TempSrc: Oral      Patients Stated Pain Goal: 8 ([id number])  Complications: No apparent anesthesia complications

## 2016-06-19 NOTE — Op Note (Signed)
06/19/2016  10:05 AM  PATIENT:  Omar [name]  70 y.o. male  PRE-OPERATIVE DIAGNOSIS:  nuclear cataract right eye  POST-OPERATIVE DIAGNOSIS:  nuclear cataract right eye  PROCEDURE:  Procedure(s): CATARACT EXTRACTION PHACO AND INTRAOCULAR LENS PLACEMENT; CDE:  11.56  SURGEON:  Surgeon(s): Williams Che, MD  ASSISTANTS: Bonney Roussel, CST   ANESTHESIA STAFF: Anesthesiologist: Lerry Liner, MD CRNA: Ollen Bowl, CRNA  ANESTHESIA:   topical and MAC  REQUESTED LENS POWER: 21.5  LENS IMPLANT INFORMATION:  Alcon SN60WF  S/n [id number]  Exp 06/2020  CUMULATIVE DISSIPATED ENERGY:11.56  INDICATIONS:see scanned office H&P for details  OP FINDINGS:dense NS  COMPLICATIONS:None  PROCEDURE:  The patient was brought to the operating room in good condition.  The operative eye was prepped and draped in the usual fashion for intraocular surgery.  Lidocaine gel was dropped onto the eye.  A 2.4 mm 10 O'clock near clear corneal stepped incision and a 12 O'clock stab incision were created.  Viscoat was instilled into the anterior chamber.  The 5 mm anterior capsulorhexis was performed with a bent needle cystotome and Utrata forceps.  The lens was hydrodissected and hydrodelineated with a cannula and balanced salt solution and rotated with a Kuglen hook.  Phacoemulsification was perfomed in the divide and conquer technique.  The remaining cortex was removed with I&A and the capsular surfaces polished as necessary.  Provisc was placed into the capsular bag and the lens inserted with the Alcon inserter.  The viscoelastic was removed with I&A and the lens "rocked" into position.  The wounds were hydrated and te anterior chamber was refilled with balanced salt solution.  The wounds were checked for leakage and rehydrated as necessary.  The lid speculum and drapes were removed and the patient was transported to short stay in good condition.  PATIENT DISPOSITION:  Short Stay

## 2016-06-19 NOTE — Anesthesia Preprocedure Evaluation (Signed)
Anesthesia Evaluation  Patient identified by MRN, date of birth, ID band Patient awake    Reviewed: Allergy & Precautions, NPO status , Patient's Chart, lab work & pertinent test results  Airway Mallampati: II  TM Distance: >3 FB     Dental  (+) Teeth Intact   Pulmonary sleep apnea ,    breath sounds clear to auscultation       Cardiovascular hypertension, Pt. on medications + CAD, + Cardiac Stents and + DVT   Rhythm:Regular Rate:Bradycardia     Neuro/Psych CVA, No Residual Symptoms    GI/Hepatic   Endo/Other  diabetes, Type 2, Oral Hypoglycemic Agents  Renal/GU      Musculoskeletal   Abdominal   Peds  Hematology   Anesthesia Other Findings   Reproductive/Obstetrics                             Anesthesia Physical Anesthesia Plan  ASA: III  Anesthesia Plan: MAC   Post-op Pain Management:    Induction: Intravenous  Airway Management Planned: Nasal Cannula  Additional Equipment:   Intra-op Plan:   Post-operative Plan:   Informed Consent: I have reviewed the patients History and Physical, chart, labs and discussed the procedure including the risks, benefits and alternatives for the proposed anesthesia with the patient or authorized representative who has indicated his/her understanding and acceptance.     Plan Discussed with:   Anesthesia Plan Comments:         Anesthesia Quick Evaluation

## 2016-06-23 ENCOUNTER — Encounter (HOSPITAL_COMMUNITY): Payer: Self-pay | Admitting: Ophthalmology

## 2016-07-07 ENCOUNTER — Other Ambulatory Visit: Payer: Self-pay | Admitting: Cardiology

## 2016-07-07 DIAGNOSIS — I10 Essential (primary) hypertension: Secondary | ICD-10-CM | POA: Diagnosis not present

## 2016-07-07 DIAGNOSIS — G4733 Obstructive sleep apnea (adult) (pediatric): Secondary | ICD-10-CM | POA: Diagnosis not present

## 2016-07-07 DIAGNOSIS — R0989 Other specified symptoms and signs involving the circulatory and respiratory systems: Secondary | ICD-10-CM

## 2016-07-07 DIAGNOSIS — E1165 Type 2 diabetes mellitus with hyperglycemia: Secondary | ICD-10-CM | POA: Diagnosis not present

## 2016-07-07 DIAGNOSIS — I2510 Atherosclerotic heart disease of native coronary artery without angina pectoris: Secondary | ICD-10-CM | POA: Diagnosis not present

## 2016-07-10 ENCOUNTER — Other Ambulatory Visit: Payer: Self-pay | Admitting: "Endocrinology

## 2016-07-11 DIAGNOSIS — N529 Male erectile dysfunction, unspecified: Secondary | ICD-10-CM | POA: Diagnosis not present

## 2016-07-13 ENCOUNTER — Ambulatory Visit (HOSPITAL_COMMUNITY): Payer: PPO

## 2016-07-21 ENCOUNTER — Telehealth: Payer: Self-pay | Admitting: "Endocrinology

## 2016-07-21 NOTE — Telephone Encounter (Signed)
Pt.notified

## 2016-07-21 NOTE — Telephone Encounter (Signed)
Yes , he can cut it in half.

## 2016-07-21 NOTE — Telephone Encounter (Signed)
ERROR

## 2016-07-21 NOTE — Telephone Encounter (Signed)
Pt wants to know if he can cut a 300mg  Invokana in half. He is on 100mg  but he needs to get samples from Dr Luan Pulling and they only have 300mg  at this time.

## 2016-07-25 ENCOUNTER — Other Ambulatory Visit: Payer: Self-pay | Admitting: "Endocrinology

## 2016-07-25 DIAGNOSIS — E1159 Type 2 diabetes mellitus with other circulatory complications: Secondary | ICD-10-CM | POA: Diagnosis not present

## 2016-07-25 LAB — COMPLETE METABOLIC PANEL WITH GFR
ALT: 16 U/L (ref 9–46)
AST: 20 U/L (ref 10–35)
Albumin: 3.8 g/dL (ref 3.6–5.1)
Alkaline Phosphatase: 57 U/L (ref 40–115)
BUN: 13 mg/dL (ref 7–25)
CALCIUM: 9.2 mg/dL (ref 8.6–10.3)
CHLORIDE: 102 mmol/L (ref 98–110)
CO2: 26 mmol/L (ref 20–31)
CREATININE: 0.95 mg/dL (ref 0.70–1.18)
GFR, EST NON AFRICAN AMERICAN: 80 mL/min (ref >=60)
Glucose, Bld: 136 mg/dL — ABNORMAL HIGH (ref 65–99)
Potassium: 4.4 mmol/L (ref 3.5–5.3)
Sodium: 139 mmol/L (ref 135–146)
Total Bilirubin: 0.5 mg/dL (ref 0.2–1.2)
Total Protein: 6.2 g/dL (ref 6.1–8.1)

## 2016-07-26 LAB — HEMOGLOBIN A1C
HEMOGLOBIN A1C: 7.3 % — AB (ref <5.7)
MEAN PLASMA GLUCOSE: 163 mg/dL

## 2016-07-28 ENCOUNTER — Ambulatory Visit (HOSPITAL_COMMUNITY)
Admission: RE | Admit: 2016-07-28 | Discharge: 2016-07-28 | Disposition: A | Discharge status: Home / Self Care | Payer: PPO | Source: Ambulatory Visit | Attending: Cardiology | Admitting: Cardiology

## 2016-07-28 ENCOUNTER — Ambulatory Visit (INDEPENDENT_AMBULATORY_CARE_PROVIDER_SITE_OTHER): Payer: PPO | Admitting: "Endocrinology

## 2016-07-28 ENCOUNTER — Encounter: Payer: Self-pay | Admitting: "Endocrinology

## 2016-07-28 VITALS — BP 148/83 | HR 63 | Ht 68.0 in | Wt 273.0 lb

## 2016-07-28 DIAGNOSIS — R0989 Other specified symptoms and signs involving the circulatory and respiratory systems: Secondary | ICD-10-CM | POA: Diagnosis not present

## 2016-07-28 DIAGNOSIS — E782 Mixed hyperlipidemia: Secondary | ICD-10-CM

## 2016-07-28 DIAGNOSIS — E1159 Type 2 diabetes mellitus with other circulatory complications: Secondary | ICD-10-CM | POA: Diagnosis not present

## 2016-07-28 DIAGNOSIS — I10 Essential (primary) hypertension: Secondary | ICD-10-CM

## 2016-07-28 DIAGNOSIS — E559 Vitamin D deficiency, unspecified: Secondary | ICD-10-CM

## 2016-07-28 DIAGNOSIS — I6523 Occlusion and stenosis of bilateral carotid arteries: Secondary | ICD-10-CM | POA: Diagnosis not present

## 2016-07-28 NOTE — Progress Notes (Signed)
Subjective:    Patient ID: Omar [name], male    DOB: Apr 26, 1946,    Past Medical History:  Diagnosis Date  . Abnormal EKG    Inferior Q waves  . BPH (benign prostatic hyperplasia)   . Coronary artery disease   . Degenerative joint disease   . Diabetes mellitus, type 2 (HCC)    No insulin  . DVT (deep venous thrombosis) ([place])   . Heart murmur   . Hepatic steatosis   . Hyperlipidemia   . 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 Meningioma (HCC)    Left frontal; CVA identified by MRI; no neurologic symptoms  . Neuropathy ([place])   . Obesity   . Sleep apnea 2008   Dr. Merlene Laughter interpreted the study-CPAP recommended, but refused by patient  . Stroke [facility])    no deficits from stroke, "Didnt know I had one when they say I did".   Past Surgical History:  Procedure Laterality Date  . CATARACT EXTRACTION W/PHACO Left 04/03/2016   Procedure: CATARACT EXTRACTION PHACO AND INTRAOCULAR LENS PLACEMENT LEFT EYE CDE=21.76;  Surgeon: Williams Che, MD;  Location: [facility];  Service: Ophthalmology;  Laterality: Left;  . CATARACT EXTRACTION W/PHACO Right 06/19/2016   Procedure: CATARACT EXTRACTION PHACO AND INTRAOCULAR LENS PLACEMENT; CDE:  11.56;  Surgeon: Williams Che, MD;  Location: [facility];  Service: Ophthalmology;  Laterality: Right;  . COLONOSCOPY W/ POLYPECTOMY  05/2008   polypectomy x4-adenomatous; internal hemorrhoids  . CORONARY ANGIOPLASTY WITH STENT PLACEMENT  06/04/2012     DES    to [place]    . DUPUYTREN / PALMAR FASCIOTOMY  2009   rt hand-dsc-went home same day x2  . LEFT HEART CATHETERIZATION WITH CORONARY ANGIOGRAM N/A 05/28/2012   Procedure: LEFT HEART CATHETERIZATION WITH CORONARY ANGIOGRAM;  Surgeon: Laverda Page, MD;  [facility] C[facility] LAB;  Service: Cardiovascular;  Laterality: N/A;  . PERCUTANEOUS CORONARY STENT INTERVENTION (PCI-S) N/A 06/04/2012   Procedure:  PERCUTANEOUS CORONARY STENT INTERVENTION (PCI-S);  Surgeon: Laverda Page, MD;  Location: [facility] CATH LAB;  Service: Cardiovascular;  Laterality: N/A;  . TONSILLECTOMY    . VENTRAL HERNIA REPAIR  2010   umb hernia-[facility]-went home same day   Social History   Social History  . Marital status: Married    Spouse name: N/A  . Number of children: N/A  . Years of education: N/A   Occupational History  . Manufacturing engineer   Social History Main Topics  . Smoking status: Never Smoker  . Smokeless tobacco: Never Used  . Alcohol use No  . Drug use: No  . Sexual activity: Not Currently    Birth control/ protection: None   Other Topics Concern  . None   Social History Narrative  . None   Outpatient Encounter Prescriptions as of 07/28/2016  Medication Sig  . aspirin 325 MG tablet Take 325 mg by mouth daily.  Marland Kitchen eplerenone (INSPRA) 25 MG tablet Take 25 mg by mouth daily.  Marland Kitchen gabapentin (NEURONTIN) 100 MG capsule TAKE (1) CAPSULE BY MOUTH TWICE DAILY.  Marland Kitchen glucose blood (ONETOUCH VERIO) test strip Use as instructed  . INVOKANA 100 MG TABS tablet TAKE ONE TABLET BY MOUTH ONCE DAILY.  Marland Kitchen lisinopril-hydrochlorothiazide (PRINZIDE,ZESTORETIC) 20-25 MG per tablet Take 1 tablet by mouth daily.  . metFORMIN (GLUCOPHAGE) 1000 MG tablet TAKE (1) TABLET BY  MOUTH TWICE DAILY.  . Multiple Vitamins-Minerals (ONE-A-DAY 50 PLUS PO) Take 1 tablet by mouth daily.  . nebivolol (BYSTOLIC) 10 MG tablet Take 20 mg by mouth daily.  . nitroGLYCERIN (NITROSTAT) 0.4 MG SL tablet Place 0.4 mg under the tongue every 5 (five) minutes as needed for chest pain.  . pravastatin (PRAVACHOL) 40 MG tablet Take 40 mg by mouth daily.  . Tamsulosin HCl (FLOMAX) 0.4 MG CAPS Take 0.4 mg by mouth daily after breakfast.  . testosterone cypionate (DEPOTESTOTERONE CYPIONATE) 100 MG/ML injection Inject into the muscle every 28 (twenty-eight) days. For IM use only  . traZODone (DESYREL) 100 MG tablet Take 100 mg by  mouth at bedtime.  . verapamil (CALAN-SR) 240 MG CR tablet Take 1 tablet (240 mg total) by mouth at bedtime.  . Vitamin D, Ergocalciferol, (DRISDOL) 50000 units CAPS capsule TAKE 1 CAPSULE BY MOUTH ONCE A WEEK.  . [DISCONTINUED] metFORMIN (GLUCOPHAGE) 500 MG tablet TAKE (1) TABLET BY MOUTH TWICE DAILY.   No facility-administered encounter medications on file as of 07/28/2016.    ALLERGIES: No Known Allergies VACCINATION STATUS:  There is no immunization history on file for this patient.  Diabetes  He presents for his follow-up diabetic visit. He has type 2 diabetes mellitus. The initial diagnosis of diabetes was made 16 years ago. His disease course has been stable. There are no hypoglycemic associated symptoms. Pertinent negatives for hypoglycemia include no confusion, headaches, pallor or seizures. Pertinent negatives for diabetes include no chest pain, no fatigue, no polydipsia, no polyphagia, no polyuria and no weakness. Symptoms are stable. Diabetic complications include heart disease and PVD. Risk factors for coronary artery disease include diabetes mellitus, dyslipidemia, obesity, male sex, hypertension and sedentary lifestyle. Current diabetic treatment includes oral agent (monotherapy). He is compliant with treatment some of the time. His weight is increasing rapidly (Overall he lost 69 pounds from November 2015, now regaining 5 pounds since last visit.). He is following a generally unhealthy diet. He has had a previous visit with a dietitian. He participates in exercise intermittently. His home blood glucose trend is decreasing steadily. His overall blood glucose range is 140-180 mg/dl. An ACE inhibitor/angiotensin II receptor blocker is being taken. Eye exam is current.  Hypertension  This is a chronic problem. The current episode started more than 1 year ago. The problem is uncontrolled. Pertinent negatives include no chest pain, headaches, neck pain, palpitations or shortness of breath.  Past treatments include ACE inhibitors, beta blockers and calcium channel blockers. The current treatment provides no improvement. Hypertensive end-organ damage includes CAD/MI and PVD.  Hyperlipidemia  This is a chronic problem. The current episode started more than 1 year ago. Pertinent negatives include no chest pain, myalgias or shortness of breath. Current antihyperlipidemic treatment includes statins. Risk factors for coronary artery disease include dyslipidemia, diabetes mellitus, hypertension, male sex, obesity and a sedentary lifestyle.   He was recently treated for  DVT of LLE .  Review of Systems  Constitutional: Negative for fatigue and unexpected weight change.  HENT: Negative for dental problem, mouth sores and trouble swallowing.   Eyes: Negative for visual disturbance.  Respiratory: Negative for cough, choking, chest tightness, shortness of breath and wheezing.   Cardiovascular: Negative for chest pain, palpitations and leg swelling.  Gastrointestinal: Negative for abdominal distention, abdominal pain, constipation, diarrhea, nausea and vomiting.  Endocrine: Negative for polydipsia, polyphagia and polyuria.  Genitourinary: Negative for dysuria, flank pain, hematuria and urgency.  Musculoskeletal: Negative for back pain, gait problem, joint swelling,  myalgias and neck pain.  Skin: Negative for pallor, rash and wound.  Neurological: Negative for seizures, syncope, weakness, numbness and headaches.  Psychiatric/Behavioral: Negative for confusion, dysphoric mood, hallucinations and suicidal ideas.    Objective:    BP (!) 148/83   Pulse 63   Ht 5\' 8"  (1.727 m)   Wt 273 lb (123.8 kg)   BMI 41.51 kg/m   Wt Readings from Last 3 Encounters:  07/28/16 273 lb (123.8 kg)  06/19/16 257 lb (116.6 kg)  06/13/16 257 lb (116.6 kg)    Physical Exam  Constitutional: He is oriented to person, place, and time. He appears well-developed and well-nourished. He is cooperative.  HENT:   Head: Normocephalic and atraumatic.  Eyes: EOM are normal.  Neck: Normal range of motion. Neck supple. No tracheal deviation present. No thyromegaly present.  Cardiovascular: Normal rate and normal heart sounds.  Exam reveals no gallop.   No murmur heard. Pulses:      Dorsalis pedis pulses are 0 on the right side, and 0 on the left side.       Posterior tibial pulses are 0 on the right side, and 0 on the left side.  Pulmonary/Chest: Breath sounds normal. No respiratory distress. He has no wheezes.  Abdominal: Soft. Bowel sounds are normal. He exhibits no distension. There is no hepatosplenomegaly. There is no tenderness. There is no guarding and no CVA tenderness.  Musculoskeletal: He exhibits no edema.       Right shoulder: He exhibits no swelling and no deformity.  Neurological: He is alert and oriented to person, place, and time. He has normal strength and normal reflexes. No cranial nerve deficit or sensory deficit. Gait normal.  Skin: Skin is warm and dry. No rash noted. No cyanosis. Nails show no clubbing.  Psychiatric: He has a normal mood and affect. His speech is normal and behavior is normal. Thought content normal. Cognition and memory are normal.    Results for orders placed or performed in visit on 07/25/16  COMPLETE METABOLIC PANEL WITH GFR  Result Value Ref Range   Sodium 139 135 - 146 mmol/L   Potassium 4.4 3.5 - 5.3 mmol/L   Chloride 102 98 - 110 mmol/L   CO2 26 20 - 31 mmol/L   Glucose, Bld 136 (H) 65 - 99 mg/dL   BUN 13 7 - 25 mg/dL   Creat 0.95 0.70 - 1.18 mg/dL   Total Bilirubin 0.5 0.2 - 1.2 mg/dL   Alkaline Phosphatase 57 40 - 115 U/L   AST 20 10 - 35 U/L   ALT 16 9 - 46 U/L   Total Protein 6.2 6.1 - 8.1 g/dL   Albumin 3.8 3.6 - 5.1 g/dL   Calcium 9.2 8.6 - 10.3 mg/dL   GFR, Est African American >89 >=60 mL/min   GFR, Est Non African American 80 >=60 mL/min  Hemoglobin A1c  Result Value Ref Range   Hgb A1c MFr Bld 7.3 (H) <5.7 %   Mean Plasma Glucose  163 mg/dL   Diabetic Labs (most recent): Lab Results  Component Value Date   HGBA1C 7.3 (H) 07/25/2016   HGBA1C 7.3 (H) 04/17/2016   HGBA1C 6.8 (H) 01/19/2016    Assessment & Plan:   1. Type 2 diabetes mellitus with other circulatory complications -He remains at risk for more complications of diabetes. -He came with Slight increase in his A1c is to 7.3% from   6.8% . - This is mainly driven by his indiscretion since last  visit gaining 21 pounds after he lost 69 pounds since November of thousand 15.   Patient is advised to stick to a routine mealtimes to eat 3 meals  a day and avoid unnecessary snacks ( to snack only to correct hypoglycemia). Patient is advised to eliminate simple carbs  from their diet including cakes desserts ice cream soda (  diet and regular) , sweet tea , Candies,  chips and cookies, artificial sweeteners,   and "sugar-free" products .  This will help patient to have stable blood glucose profile and potentially lose weight. Patient is given detailed personalized glucose monitoring and insulin dosing instructions. Patient is instructed to call back with extremes of blood glucose less than 70 or greater than 300.  Based on his current progress, he will not need insulin for now.   He is  advised to  Continue   Metformin 1000mg  po BID, and  Invokana 100mg   PO qAM. he is getting  Invokana samples currently from his primary medical doctor, he will call for prescription when he runs out. - To be concerned and his case is his recent rapid weight gain which will definitely making glucose control of diabetes. If he loses more control, he would be considered for incretin therapy or basal insulin .  2. Hyperlipidemia: I advised him to continue Pravastatin 40 mg po qhs.  3. Obesity Dietitian consult in progress, He has lost 53 lbs. Encouraged to continue carbs restriction.  4. Essential hypertension Controlled. Continue current meds including ACEI, beta blocker, CCB.  5)  vitamin D deficiency: I advised him to continue vitamin D 50,000 units weekly  Follow up plan: Return in about 3 months (around 10/28/2016) for follow up with pre-visit labs. With meter and logs for f/u.  Glade Lloyd, MD Phone: [phone]  Fax: [phone]   07/28/2016, 9:30 AM

## 2016-08-21 ENCOUNTER — Encounter (HOSPITAL_COMMUNITY): Payer: Self-pay | Admitting: Emergency Medicine

## 2016-08-21 ENCOUNTER — Emergency Department (HOSPITAL_COMMUNITY): Payer: PPO

## 2016-08-21 ENCOUNTER — Emergency Department (HOSPITAL_COMMUNITY)
Admission: EM | Admit: 2016-08-21 | Discharge: 2016-08-21 | Disposition: A | Discharge status: Home / Self Care | Payer: PPO | Attending: Emergency Medicine | Admitting: Emergency Medicine

## 2016-08-21 DIAGNOSIS — I2510 Atherosclerotic heart disease of native coronary artery without angina pectoris: Secondary | ICD-10-CM | POA: Diagnosis not present

## 2016-08-21 DIAGNOSIS — Z7982 Long term (current) use of aspirin: Secondary | ICD-10-CM | POA: Insufficient documentation

## 2016-08-21 DIAGNOSIS — L03115 Cellulitis of right lower limb: Secondary | ICD-10-CM | POA: Diagnosis not present

## 2016-08-21 DIAGNOSIS — Z79899 Other long term (current) drug therapy: Secondary | ICD-10-CM | POA: Insufficient documentation

## 2016-08-21 DIAGNOSIS — E119 Type 2 diabetes mellitus without complications: Secondary | ICD-10-CM | POA: Insufficient documentation

## 2016-08-21 DIAGNOSIS — I10 Essential (primary) hypertension: Secondary | ICD-10-CM | POA: Diagnosis not present

## 2016-08-21 DIAGNOSIS — I82411 Acute embolism and thrombosis of right femoral vein: Secondary | ICD-10-CM

## 2016-08-21 DIAGNOSIS — Z7984 Long term (current) use of oral hypoglycemic drugs: Secondary | ICD-10-CM | POA: Insufficient documentation

## 2016-08-21 DIAGNOSIS — M7989 Other specified soft tissue disorders: Secondary | ICD-10-CM | POA: Diagnosis not present

## 2016-08-21 DIAGNOSIS — I82401 Acute embolism and thrombosis of unspecified deep veins of right lower extremity: Secondary | ICD-10-CM | POA: Diagnosis not present

## 2016-08-21 MED ORDER — APIXABAN 5 MG PO TABS
10.0000 mg | ORAL_TABLET | Freq: Once | ORAL | Status: AC
  Administered 2016-08-21: 10 mg via ORAL
  Filled 2016-08-21: qty 2

## 2016-08-21 MED ORDER — DOXYCYCLINE HYCLATE 100 MG PO TABS
100.0000 mg | ORAL_TABLET | Freq: Once | ORAL | Status: AC
  Administered 2016-08-21: 100 mg via ORAL
  Filled 2016-08-21: qty 1

## 2016-08-21 MED ORDER — APIXABAN 5 MG PO TABS: 5.0000 mg | ORAL_TABLET | Freq: Two times a day (BID) | ORAL | 0 refills | Status: DC

## 2016-08-21 MED ORDER — DOXYCYCLINE HYCLATE 100 MG PO CAPS: 100.0000 mg | ORAL_CAPSULE | Freq: Two times a day (BID) | ORAL | 0 refills | Status: DC

## 2016-08-21 NOTE — ED Triage Notes (Signed)
Right leg and foot pain and edema x 3 days. HX DVT in left leg. Was on blood thinner until May and change to Asprin.

## 2016-08-21 NOTE — ED Notes (Signed)
edp in with pt 

## 2016-08-21 NOTE — ED Notes (Signed)
Pharmacist in to educate pt and family about eliquis

## 2016-08-21 NOTE — ED Provider Notes (Signed)
[facility] DEPT Provider Note   CSN: [id number] Arrival date & time: 08/21/16  0710  By signing my name below, I, Higinio Plan, attest that this documentation has been prepared under the direction and in the presence of Nat Christen, MD . Electronically Signed: Higinio Plan, Scribe. 08/21/2016. 8:17 AM.  History   Chief Complaint Chief Complaint  Patient presents with  . Leg Swelling   The history is provided by the patient. No language interpreter was used.   HPI Comments: Omar [name] is a 70 y.o. male who presents to the Emergency Department complaining of gradually worsening, erythema to his right anterior,distal tibial area with associated minimal calf tenderness. He is s/p a DVT in September, 2016 in his left leg which resulted in eliquis for several months followed by aspirin. Pt reports associated pain with flexion of his right ankle but no chest pain or dyspnea. He also denies fever, diaphoresis and chills.   Past Medical History:  Diagnosis Date  . Abnormal EKG    Inferior Q waves  . BPH (benign prostatic hyperplasia)   . Coronary artery disease   . Degenerative joint disease   . Diabetes mellitus, type 2 (HCC)    No insulin  . DVT (deep venous thrombosis) ([place])   . Heart murmur   . Hepatic steatosis   . Hyperlipidemia   . 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 Meningioma (HCC)    Left frontal; CVA identified by MRI; no neurologic symptoms  . Neuropathy ([place])   . Obesity   . Sleep apnea 2008   Dr. Merlene Laughter interpreted the study-CPAP recommended, but refused by patient  . Stroke [facility])    no deficits from stroke, "Didnt know I had one when they say I did".    Patient Active Problem List   Diagnosis Date Noted  . Vitamin D deficiency 01/21/2016  . DVT of leg (deep venous thrombosis) ([place]) 07/02/2015  . CAD (coronary artery disease), native coronary artery 06/05/2012  . Postsurgical  percutaneous transluminal coronary angioplasty (PTCA) status 06/05/2012  . Hypertension   . Type 2 diabetes mellitus with vascular disease ([place])   . Sleep apnea   . Meningioma ([place])   . Hyperlipidemia   . Obesity   . Hepatic steatosis   . Abnormal EKG     Past Surgical History:  Procedure Laterality Date  . CATARACT EXTRACTION W/PHACO Left 04/03/2016   Procedure: CATARACT EXTRACTION PHACO AND INTRAOCULAR LENS PLACEMENT LEFT EYE CDE=21.76;  Surgeon: Williams Che, MD;  Location: [facility] ORS;  Service: Ophthalmology;  Laterality: Left;  . CATARACT EXTRACTION W/PHACO Right 06/19/2016   Procedure: CATARACT EXTRACTION PHACO AND INTRAOCULAR LENS PLACEMENT; CDE:  11.56;  Surgeon: Williams Che, MD;  Location: [facility] ORS;  Service: Ophthalmology;  Laterality: Right;  . COLONOSCOPY W/ POLYPECTOMY  05/2008   polypectomy x4-adenomatous; internal hemorrhoids  . CORONARY ANGIOPLASTY WITH STENT PLACEMENT  06/04/2012     DES    to [place]    . DUPUYTREN / PALMAR FASCIOTOMY  2009   rt hand-dsc-went home same day x2  . LEFT HEART CATHETERIZATION WITH CORONARY ANGIOGRAM N/A 05/28/2012   Procedure: LEFT HEART CATHETERIZATION WITH CORONARY ANGIOGRAM;  Surgeon: Laverda Page, MD;  Location: [facility] CATH LAB;  Service: Cardiovascular;  Laterality: N/A;  . PERCUTANEOUS CORONARY STENT INTERVENTION (PCI-S) N/A 06/04/2012   Procedure: PERCUTANEOUS CORONARY STENT INTERVENTION (PCI-S);  Surgeon: Turner Daniels  Einar Gip, MD;  Location: [facility] CATH LAB;  Service: Cardiovascular;  Laterality: N/A;  . TONSILLECTOMY    . VENTRAL HERNIA REPAIR  2010   umb hernia-[facility]-went home same day    Home Medications    Prior to Admission medications   Medication Sig Start Date End Date Taking? Authorizing Provider  aspirin 325 MG tablet Take 325 mg by mouth daily.   Yes Historical Provider, MD  eplerenone (INSPRA) 25 MG tablet Take 25 mg by mouth daily.   Yes Historical Provider, MD  gabapentin (NEURONTIN) 100 MG capsule  TAKE (1) CAPSULE BY MOUTH TWICE DAILY. 06/06/16  Yes Cassandria Anger, MD  INVOKANA 100 MG TABS tablet TAKE ONE TABLET BY MOUTH ONCE DAILY. 07/10/16  Yes Cassandria Anger, MD  lisinopril-hydrochlorothiazide (PRINZIDE,ZESTORETIC) 20-25 MG per tablet Take 1 tablet by mouth daily.   Yes Historical Provider, MD  metFORMIN (GLUCOPHAGE) 1000 MG tablet TAKE (1) TABLET BY MOUTH TWICE DAILY. 02/21/16  Yes Cassandria Anger, MD  Multiple Vitamins-Minerals (ONE-A-DAY 50 PLUS PO) Take 1 tablet by mouth daily.   Yes Historical Provider, MD  nebivolol (BYSTOLIC) 10 MG tablet Take 20 mg by mouth daily.   Yes Historical Provider, MD  pravastatin (PRAVACHOL) 40 MG tablet Take 40 mg by mouth daily.   Yes Historical Provider, MD  Tamsulosin HCl (FLOMAX) 0.4 MG CAPS Take 0.4 mg by mouth daily after breakfast.   Yes Historical Provider, MD  testosterone cypionate (DEPOTESTOTERONE CYPIONATE) 100 MG/ML injection Inject into the muscle every 28 (twenty-eight) days. For IM use only   Yes Historical Provider, MD  traZODone (DESYREL) 100 MG tablet Take 100 mg by mouth at bedtime.   Yes Historical Provider, MD  verapamil (CALAN-SR) 240 MG CR tablet Take 1 tablet (240 mg total) by mouth at bedtime. 05/12/13  Yes Yehuda Savannah, MD  Vitamin D, Ergocalciferol, (DRISDOL) 50000 units CAPS capsule TAKE 1 CAPSULE BY MOUTH ONCE A WEEK. 06/12/16  Yes Cassandria Anger, MD  apixaban (ELIQUIS) 5 MG TABS tablet Take 1 tablet (5 mg total) by mouth 2 (two) times daily. For the first 7 days, take 2 tablets twice a day. Then begin 1 tablet twice a day. 08/21/16   Nat Christen, MD  doxycycline (VIBRAMYCIN) 100 MG capsule Take 1 capsule (100 mg total) by mouth 2 (two) times daily. 08/21/16   Nat Christen, MD  nitroGLYCERIN (NITROSTAT) 0.4 MG SL tablet Place 0.4 mg under the tongue every 5 (five) minutes as needed for chest pain.    Historical Provider, MD    Family History Family History  Problem Relation Age of Onset  . Cancer Mother    . Cancer Father   . Coronary artery disease Neg Hx     Social History Social History  Substance Use Topics  . Smoking status: Never Smoker  . Smokeless tobacco: Never Used  . Alcohol use No     Allergies   Patient has no known allergies.   Review of Systems Review of Systems  Constitutional: Negative for chills, diaphoresis and fever.  Cardiovascular: Negative for chest pain.  Skin: Positive for color change.   Physical Exam Updated Vital Signs BP 158/84 (BP Location: Left Arm)   Pulse 76   Temp 97.6 F (36.4 C) (Oral)   Resp 18   Ht 5\' 8"  (1.727 m)   Wt 265 lb (120.2 kg)   SpO2 95%   BMI 40.29 kg/m   Physical Exam  Constitutional: He is oriented to person, place, and time.  He appears well-developed and well-nourished.  HENT:  Head: Normocephalic and atraumatic.  Eyes: Conjunctivae are normal.  Neck: Neck supple.  Cardiovascular: Normal rate and regular rhythm.   Pulmonary/Chest: Effort normal and breath sounds normal.  Abdominal: Soft. Bowel sounds are normal.  Musculoskeletal: Normal range of motion. He exhibits tenderness.  RLE: erythema from the mid right tibia distally and anteriorally. Minimal right calf tenderness.   Neurological: He is alert and oriented to person, place, and time.  Skin: Skin is warm and dry.  Psychiatric: He has a normal mood and affect. His behavior is normal.  Nursing note and vitals reviewed.  ED Treatments / Results  Labs (all labs ordered are listed, but only abnormal results are displayed) Labs Reviewed - No data to display  EKG  EKG Interpretation None       Radiology US Venous Img Lower Unilateral Right  Result Date: 08/21/2016 CLINICAL DATA:  Right lower extremity erythema. Calf pain. Symptoms for 2 weeks. EXAM: RIGHT LOWER EXTREMITY VENOUS DOPPLER ULTRASOUND TECHNIQUE: Gray-scale sonography with graded compression, as well as color Doppler and duplex ultrasound, were performed to evaluate the deep venous  system from the level of the common femoral vein through the popliteal and proximal calf veins. Spectral Doppler was utilized to evaluate flow at rest and with distal augmentation maneuvers. COMPARISON:  06/17/2015 FINDINGS: Left common femoral vein is patent without thrombus. Positive for deep venous thrombosis in the right lower extremity. There is occlusive thrombus in the right common femoral vein. The right saphenofemoral junction is patent. Unclear if the profunda femoral vein has thrombus. 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 The lateral femoral vein is thrombosed. The more medial femoral vein remains patent. There is partial compressibility in the right popliteal vein with nonocclusive thrombus. Visualized deep calf veins appear to be occluded. There is subcutaneous edema in the right calf. IMPRESSION: Positive for deep venous thrombosis in the right lower extremity. There is thrombus extending from the right common femoral vein down to the right calf. Electronically Signed   By: Markus Daft M.D.   On: 08/21/2016 09:33   Procedures Procedures (including critical care time)  Medications Ordered in ED Medications  doxycycline (VIBRA-TABS) tablet 100 mg (100 mg Oral Given 08/21/16 0832)  apixaban (ELIQUIS) tablet 10 mg (10 mg Oral Given 08/21/16 1058)   DIAGNOSTIC STUDIES:  Oxygen Saturation is 95% on RA, normal by my interpretation.    COORDINATION OF CARE:  8:16 AM Discussed treatment plan with pt at bedside and pt agreed to plan.  Initial Impression / Assessment and Plan / ED Course  I have reviewed the triage vital signs and the nursing notes.  Pertinent labs & imaging results that were available during my care of the patient were reviewed by me and considered in my medical decision making (see chart for details).  Clinical Course     Will otain a doplar study to rule out a DVT and treat cellulitis of right leg.   Doppler study of right lower extremity reveals a DVT  from the right femoral vein distally. Additionally, patient has a cellulitis on his distal anterior tibia region. No dyspnea or chest pain. Discharge medications Eliquis and doxycycline 100 mg. Results of all tests were discussed with the patient, wife, daughter. He has primary care follow-up.  I personally performed the services described in this documentation, which was scribed in my presence. The recorded information has been reviewed and is accurate.   Final Clinical Impressions(s) / ED  Diagnoses   Final diagnoses:  Acute deep vein thrombosis (DVT) of femoral vein of right lower extremity (HCC)  Cellulitis of right lower extremity    New Prescriptions Discharge Medication List as of 08/21/2016 11:02 AM    START taking these medications   Details  apixaban (ELIQUIS) 5 MG TABS tablet Take 1 tablet (5 mg total) by mouth 2 (two) times daily. For the first 7 days, take 2 tablets twice a day. Then begin 1 tablet twice a day., Starting Mon 08/21/2016, Print    doxycycline (VIBRAMYCIN) 100 MG capsule Take 1 capsule (100 mg total) by mouth 2 (two) times daily., Starting Mon 08/21/2016, Print         Nat Christen, MD 08/21/16 1208

## 2016-08-21 NOTE — ED Notes (Signed)
Pt reports has history of dvt in left leg and has been on eloquis.  Reports was taken off of eloquis in May and started on aspirin.  Reports pain, swelling, and redness to r lower leg x 2 weeks but worse the past 3 days.  Pedal pulse audible with doppler.  Dr. Lacinda Axon aware and is at bedside.

## 2016-08-21 NOTE — Discharge Instructions (Signed)
Information on my medicine - ELIQUIS (apixaban)  This medication education was reviewed with me or my healthcare representative as part of my discharge preparation.  The pharmacist that spoke with me during my hospital stay was:  Kaston Faughn, Alex Gardener, [place]?   You have a blood clot in your right lower leg. Additionally, you have a infection called cellulitis. Prescription for blood thinning medicine Eliquis and an antibiotic doxycycline. Follow-up your primary care doctor. Eliquis was prescribed to treat blood clots that may have been found in the veins of your legs (deep vein thrombosis) or in your lungs (pulmonary embolism) and to reduce the risk of them occurring again.  WHAT DO YOU NEED TO KNOW ABOUT ELIQUIS ? The starting dose is 10 mg (two 5 mg tablets) taken TWICE daily for the FIRST SEVEN (7) DAYS, then on 11/27  the dose is reduced to ONE 5 mg tablet taken TWICE daily.  Eliquis may be taken with or without food.   Try to take the dose about the same time in the morning and in the evening. If you have difficulty swallowing the tablet whole please discuss with your pharmacist how to take the medication safely.  Take Eliquis exactly as prescribed and DO NOT stop taking Eliquis without talking to the doctor who prescribed the medication.  Stopping may increase your risk of developing a new blood clot.  Refill your prescription before you run out.  After discharge, you should have regular check-up appointments with your healthcare provider that is prescribing your Eliquis.    WHAT DO YOU DO IF YOU MISS A DOSE? If a dose of ELIQUIS is not taken at the scheduled time, take it as soon as possible on the same day and twice-daily administration should be resumed. The dose should not be doubled to make up for a missed dose.  IMPORTANT SAFETY INFORMATION A possible side effect of Eliquis is bleeding. You should call your healthcare provider right away if  you experience any of the following: Bleeding from an injury or your nose that does not stop. Unusual colored urine (red or dark brown) or unusual colored stools (red or black). Unusual bruising for unknown reasons. A serious fall or if you hit your head (even if there is no bleeding).  Some medicines may interact with Eliquis and might increase your risk of bleeding or clotting while on Eliquis. To help avoid this, consult your healthcare provider or pharmacist prior to using any new prescription or non-prescription medications, including herbals, vitamins, non-steroidal anti-inflammatory drugs (NSAIDs) and supplements.  This website has more information on Eliquis (apixaban): http://www.eliquis.com/eliquis/home

## 2016-08-21 NOTE — ED Notes (Signed)
Pharmacy aware needing eliquis

## 2016-08-21 NOTE — ED Notes (Signed)
US in with pt. 

## 2016-08-31 DIAGNOSIS — I82401 Acute embolism and thrombosis of unspecified deep veins of right lower extremity: Secondary | ICD-10-CM | POA: Diagnosis not present

## 2016-08-31 DIAGNOSIS — N529 Male erectile dysfunction, unspecified: Secondary | ICD-10-CM | POA: Diagnosis not present

## 2016-08-31 DIAGNOSIS — I10 Essential (primary) hypertension: Secondary | ICD-10-CM | POA: Diagnosis not present

## 2016-08-31 DIAGNOSIS — Z86711 Personal history of pulmonary embolism: Secondary | ICD-10-CM | POA: Diagnosis not present

## 2016-09-07 ENCOUNTER — Other Ambulatory Visit: Payer: Self-pay | Admitting: "Endocrinology

## 2016-09-28 DIAGNOSIS — Z1211 Encounter for screening for malignant neoplasm of colon: Secondary | ICD-10-CM | POA: Diagnosis not present

## 2016-09-28 DIAGNOSIS — Z0000 Encounter for general adult medical examination without abnormal findings: Secondary | ICD-10-CM | POA: Diagnosis not present

## 2016-09-28 DIAGNOSIS — Z86711 Personal history of pulmonary embolism: Secondary | ICD-10-CM | POA: Diagnosis not present

## 2016-09-28 DIAGNOSIS — I10 Essential (primary) hypertension: Secondary | ICD-10-CM | POA: Diagnosis not present

## 2016-09-28 DIAGNOSIS — G4733 Obstructive sleep apnea (adult) (pediatric): Secondary | ICD-10-CM | POA: Diagnosis not present

## 2016-09-28 DIAGNOSIS — E119 Type 2 diabetes mellitus without complications: Secondary | ICD-10-CM | POA: Diagnosis not present

## 2016-10-04 ENCOUNTER — Ambulatory Visit (HOSPITAL_COMMUNITY)
Admission: RE | Admit: 2016-10-04 | Discharge: 2016-10-04 | Disposition: A | Discharge status: Home / Self Care | Payer: PPO | Source: Ambulatory Visit | Attending: Pulmonary Disease | Admitting: Pulmonary Disease

## 2016-10-04 ENCOUNTER — Other Ambulatory Visit (HOSPITAL_COMMUNITY): Payer: Self-pay | Admitting: Pulmonary Disease

## 2016-10-04 DIAGNOSIS — R0602 Shortness of breath: Secondary | ICD-10-CM | POA: Insufficient documentation

## 2016-10-04 DIAGNOSIS — R05 Cough: Secondary | ICD-10-CM | POA: Diagnosis not present

## 2016-10-10 DIAGNOSIS — N529 Male erectile dysfunction, unspecified: Secondary | ICD-10-CM | POA: Diagnosis not present

## 2016-10-24 ENCOUNTER — Other Ambulatory Visit: Payer: Self-pay | Admitting: "Endocrinology

## 2016-10-24 DIAGNOSIS — E1159 Type 2 diabetes mellitus with other circulatory complications: Secondary | ICD-10-CM | POA: Diagnosis not present

## 2016-10-24 LAB — COMPREHENSIVE METABOLIC PANEL
ALBUMIN: 3.5 g/dL — AB (ref 3.6–5.1)
ALT: 13 U/L (ref 9–46)
AST: 16 U/L (ref 10–35)
Alkaline Phosphatase: 55 U/L (ref 40–115)
BUN: 13 mg/dL (ref 7–25)
CALCIUM: 9.3 mg/dL (ref 8.6–10.3)
CHLORIDE: 103 mmol/L (ref 98–110)
CO2: 29 mmol/L (ref 20–31)
Creat: 0.86 mg/dL (ref 0.70–1.18)
GLUCOSE: 153 mg/dL — AB (ref 65–99)
POTASSIUM: 4 mmol/L (ref 3.5–5.3)
Sodium: 140 mmol/L (ref 135–146)
Total Bilirubin: 0.6 mg/dL (ref 0.2–1.2)
Total Protein: 6 g/dL — ABNORMAL LOW (ref 6.1–8.1)

## 2016-10-25 LAB — HEMOGLOBIN A1C
Hgb A1c MFr Bld: 7.5 % — ABNORMAL HIGH (ref <5.7)
MEAN PLASMA GLUCOSE: 169 mg/dL

## 2016-10-27 ENCOUNTER — Encounter: Payer: Self-pay | Admitting: "Endocrinology

## 2016-10-27 ENCOUNTER — Ambulatory Visit (INDEPENDENT_AMBULATORY_CARE_PROVIDER_SITE_OTHER): Payer: PPO | Admitting: "Endocrinology

## 2016-10-27 VITALS — BP 140/87 | HR 72 | Ht 68.0 in | Wt 270.0 lb

## 2016-10-27 DIAGNOSIS — E1159 Type 2 diabetes mellitus with other circulatory complications: Secondary | ICD-10-CM | POA: Diagnosis not present

## 2016-10-27 DIAGNOSIS — E6609 Other obesity due to excess calories: Secondary | ICD-10-CM

## 2016-10-27 DIAGNOSIS — I10 Essential (primary) hypertension: Secondary | ICD-10-CM | POA: Diagnosis not present

## 2016-10-27 DIAGNOSIS — Z6841 Body Mass Index (BMI) 40.0 and over, adult: Secondary | ICD-10-CM

## 2016-10-27 DIAGNOSIS — IMO0001 Reserved for inherently not codable concepts without codable children: Secondary | ICD-10-CM

## 2016-10-27 DIAGNOSIS — E782 Mixed hyperlipidemia: Secondary | ICD-10-CM

## 2016-10-27 NOTE — Patient Instructions (Signed)

## 2016-10-27 NOTE — Progress Notes (Signed)
Subjective:    Patient ID: Omar [name], male    DOB: Dec 15, 1945,    Past Medical History:  Diagnosis Date  . Abnormal EKG    Inferior Q waves  . BPH (benign prostatic hyperplasia)   . Coronary artery disease   . Degenerative joint disease   . Diabetes mellitus, type 2 (HCC)    No insulin  . DVT (deep venous thrombosis) ([place])   . Heart murmur   . Hepatic steatosis   . Hyperlipidemia   . 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 Meningioma (HCC)    Left frontal; CVA identified by MRI; no neurologic symptoms  . Neuropathy ([place])   . Obesity   . Sleep apnea 2008   Dr. Merlene Laughter interpreted the study-CPAP recommended, but refused by patient  . Stroke [facility])    no deficits from stroke, "Didnt know I had one when they say I did".   Past Surgical History:  Procedure Laterality Date  . CATARACT EXTRACTION W/PHACO Left 04/03/2016   Procedure: CATARACT EXTRACTION PHACO AND INTRAOCULAR LENS PLACEMENT LEFT EYE CDE=21.76;  Surgeon: Williams Che, MD;  Location: [facility];  Service: Ophthalmology;  Laterality: Left;  . CATARACT EXTRACTION W/PHACO Right 06/19/2016   Procedure: CATARACT EXTRACTION PHACO AND INTRAOCULAR LENS PLACEMENT; CDE:  11.56;  Surgeon: Williams Che, MD;  Location: [facility];  Service: Ophthalmology;  Laterality: Right;  . COLONOSCOPY W/ POLYPECTOMY  05/2008   polypectomy x4-adenomatous; internal hemorrhoids  . CORONARY ANGIOPLASTY WITH STENT PLACEMENT  06/04/2012     DES    to Scalp Level    . DUPUYTREN / PALMAR FASCIOTOMY  2009   rt hand-dsc-went home same day x2  . LEFT HEART CATHETERIZATION WITH CORONARY ANGIOGRAM N/A 05/28/2012   Procedure: LEFT HEART CATHETERIZATION WITH CORONARY ANGIOGRAM;  Surgeon: Laverda Page, MD;  [facility] CATH LAB;  Service: Cardiovascular;  Laterality: N/A;  . PERCUTANEOUS CORONARY STENT INTERVENTION (PCI-S) N/A 06/04/2012   Procedure:  PERCUTANEOUS CORONARY STENT INTERVENTION (PCI-S);  Surgeon: Laverda Page, MD;  Location: [facility] CATH LAB;  Service: Cardiovascular;  Laterality: N/A;  . TONSILLECTOMY    . VENTRAL HERNIA REPAIR  2010   umb hernia-[facility]-went home same day   Social History   Social History  . Marital status: Married    Spouse name: N/A  . Number of children: N/A  . Years of education: N/A   Occupational History  . Manufacturing engineer   Social History Main Topics  . Smoking status: Never Smoker  . Smokeless tobacco: Never Used  . Alcohol use No  . Drug use: No  . Sexual activity: Not Currently    Birth control/ protection: None   Other Topics Concern  . Not on file   Social History Narrative  . No narrative on file   Outpatient Encounter Prescriptions as of 10/27/2016  Medication Sig  . apixaban (ELIQUIS) 5 MG TABS tablet Take 1 tablet (5 mg total) by mouth 2 (two) times daily. For the first 7 days, take 2 tablets twice a day. Then begin 1 tablet twice a day.  Marland Kitchen aspirin 325 MG tablet Take 325 mg by mouth daily.  Marland Kitchen doxycycline (VIBRAMYCIN) 100 MG capsule Take 1 capsule (100 mg total) by mouth 2 (two) times daily.  Marland Kitchen eplerenone (INSPRA) 25 MG tablet Take 25 mg by mouth daily.  Marland Kitchen  gabapentin (NEURONTIN) 100 MG capsule TAKE (1) CAPSULE BY MOUTH TWICE DAILY.  Marland Kitchen INVOKANA 100 MG TABS tablet TAKE ONE TABLET BY MOUTH ONCE DAILY.  Marland Kitchen lisinopril-hydrochlorothiazide (PRINZIDE,ZESTORETIC) 20-25 MG per tablet Take 1 tablet by mouth daily.  . metFORMIN (GLUCOPHAGE) 1000 MG tablet TAKE (1) TABLET BY MOUTH TWICE DAILY.  . Multiple Vitamins-Minerals (ONE-A-DAY 50 PLUS PO) Take 1 tablet by mouth daily.  . nebivolol (BYSTOLIC) 10 MG tablet Take 20 mg by mouth daily.  . nitroGLYCERIN (NITROSTAT) 0.4 MG SL tablet Place 0.4 mg under the tongue every 5 (five) minutes as needed for chest pain.  . pravastatin (PRAVACHOL) 40 MG tablet Take 40 mg by mouth daily.  . Tamsulosin HCl (FLOMAX) 0.4 MG CAPS  Take 0.4 mg by mouth daily after breakfast.  . testosterone cypionate (DEPOTESTOTERONE CYPIONATE) 100 MG/ML injection Inject into the muscle every 28 (twenty-eight) days. For IM use only  . traZODone (DESYREL) 100 MG tablet Take 100 mg by mouth at bedtime.  . verapamil (CALAN-SR) 240 MG CR tablet Take 1 tablet (240 mg total) by mouth at bedtime.  . Vitamin D, Ergocalciferol, (DRISDOL) 50000 units CAPS capsule TAKE 1 CAPSULE BY MOUTH ONCE A WEEK.   No facility-administered encounter medications on file as of 10/27/2016.    ALLERGIES: No Known Allergies VACCINATION STATUS:  There is no immunization history on file for this patient.  Diabetes  He presents for his follow-up diabetic visit. He has type 2 diabetes mellitus. The initial diagnosis of diabetes was made 16 years ago. His disease course has been stable. There are no hypoglycemic associated symptoms. Pertinent negatives for hypoglycemia include no confusion, headaches, pallor or seizures. Pertinent negatives for diabetes include no chest pain, no fatigue, no polydipsia, no polyphagia, no polyuria and no weakness. Symptoms are stable. Diabetic complications include heart disease and PVD. Risk factors for coronary artery disease include diabetes mellitus, dyslipidemia, obesity, male sex, hypertension and sedentary lifestyle. Current diabetic treatment includes oral agent (monotherapy). He is compliant with treatment some of the time. His weight is increasing rapidly (Overall he lost 69 pounds from November 2015, now regaining 5 pounds since last visit.). He is following a generally unhealthy diet. He has had a previous visit with a dietitian. He participates in exercise intermittently. His home blood glucose trend is decreasing steadily. His overall blood glucose range is 140-180 mg/dl. An ACE inhibitor/angiotensin II receptor blocker is being taken. Eye exam is current.  Hypertension  This is a chronic problem. The current episode started more  than 1 year ago. The problem is uncontrolled. Pertinent negatives include no chest pain, headaches, neck pain, palpitations or shortness of breath. Past treatments include ACE inhibitors, beta blockers and calcium channel blockers. The current treatment provides no improvement. Hypertensive end-organ damage includes CAD/MI and PVD.  Hyperlipidemia  This is a chronic problem. The current episode started more than 1 year ago. Pertinent negatives include no chest pain, myalgias or shortness of breath. Current antihyperlipidemic treatment includes statins. Risk factors for coronary artery disease include dyslipidemia, diabetes mellitus, hypertension, male sex, obesity and a sedentary lifestyle.   He was recently treated for  DVT of LLE .  Review of Systems  Constitutional: Negative for fatigue and unexpected weight change.  HENT: Negative for dental problem, mouth sores and trouble swallowing.   Eyes: Negative for visual disturbance.  Respiratory: Negative for cough, choking, chest tightness, shortness of breath and wheezing.   Cardiovascular: Negative for chest pain, palpitations and leg swelling.  Gastrointestinal: Negative for abdominal  distention, abdominal pain, constipation, diarrhea, nausea and vomiting.  Endocrine: Negative for polydipsia, polyphagia and polyuria.  Genitourinary: Negative for dysuria, flank pain, hematuria and urgency.  Musculoskeletal: Negative for back pain, gait problem, joint swelling, myalgias and neck pain.  Skin: Negative for pallor, rash and wound.  Neurological: Negative for seizures, syncope, weakness, numbness and headaches.  Psychiatric/Behavioral: Negative for confusion, dysphoric mood, hallucinations and suicidal ideas.    Objective:    There were no vitals taken for this visit.  Wt Readings from Last 3 Encounters:  08/21/16 265 lb (120.2 kg)  07/28/16 273 lb (123.8 kg)  06/19/16 257 lb (116.6 kg)    Physical Exam  Constitutional: He is oriented to  person, place, and time. He appears well-developed and well-nourished. He is cooperative.  HENT:  Head: Normocephalic and atraumatic.  Eyes: EOM are normal.  Neck: Normal range of motion. Neck supple. No tracheal deviation present. No thyromegaly present.  Cardiovascular: Normal rate and normal heart sounds.  Exam reveals no gallop.   No murmur heard. Pulses:      Dorsalis pedis pulses are 0 on the right side, and 0 on the left side.       Posterior tibial pulses are 0 on the right side, and 0 on the left side.  Pulmonary/Chest: Breath sounds normal. No respiratory distress. He has no wheezes.  Abdominal: Soft. Bowel sounds are normal. He exhibits no distension. There is no hepatosplenomegaly. There is no tenderness. There is no guarding and no CVA tenderness.  Musculoskeletal: He exhibits no edema.       Right shoulder: He exhibits no swelling and no deformity.  Neurological: He is alert and oriented to person, place, and time. He has normal strength and normal reflexes. No cranial nerve deficit or sensory deficit. Gait normal.  Skin: Skin is warm and dry. No rash noted. No cyanosis. Nails show no clubbing.  Psychiatric: He has a normal mood and affect. His speech is normal and behavior is normal. Thought content normal. Cognition and memory are normal.    Results for orders placed or performed in visit on 10/24/16  Comprehensive metabolic panel  Result Value Ref Range   Sodium 140 135 - 146 mmol/L   Potassium 4.0 3.5 - 5.3 mmol/L   Chloride 103 98 - 110 mmol/L   CO2 29 20 - 31 mmol/L   Glucose, Bld 153 (H) 65 - 99 mg/dL   BUN 13 7 - 25 mg/dL   Creat 0.86 0.70 - 1.18 mg/dL   Total Bilirubin 0.6 0.2 - 1.2 mg/dL   Alkaline Phosphatase 55 40 - 115 U/L   AST 16 10 - 35 U/L   ALT 13 9 - 46 U/L   Total Protein 6.0 (L) 6.1 - 8.1 g/dL   Albumin 3.5 (L) 3.6 - 5.1 g/dL   Calcium 9.3 8.6 - 10.3 mg/dL  Hemoglobin A1c  Result Value Ref Range   Hgb A1c MFr Bld 7.5 (H) <5.7 %   Mean Plasma  Glucose 169 mg/dL   Diabetic Labs (most recent): Lab Results  Component Value Date   HGBA1C 7.5 (H) 10/24/2016   HGBA1C 7.3 (H) 07/25/2016   HGBA1C 7.3 (H) 04/17/2016    Assessment & Plan:   1. Type 2 diabetes mellitus with other circulatory complications -He remains at risk for more complications of diabetes. -He came with Slight increase in his A1c is stable at 7.5% <-7.3%<-  6.8% . - This is mainly driven by his indiscretion. - He lost 3  pounds since last visit.  Patient is advised to stick to a routine mealtimes to eat 3 meals  a day and avoid unnecessary snacks ( to snack only to correct hypoglycemia). Patient is advised to eliminate simple carbs  from their diet including cakes desserts ice cream soda (  diet and regular) , sweet tea , Candies,  chips and cookies, artificial sweeteners,   and "sugar-free" products .  This will help patient to have stable blood glucose profile and potentially lose weight. Patient is given detailed personalized glucose monitoring and insulin dosing instructions. Patient is instructed to call back with extremes of blood glucose less than 70 or greater than 300.  Based on his current progress, he will not need insulin for now.   He is  advised to  Continue   Metformin 1000mg  po BID, and  Invokana 100mg   PO qAM. he is getting  Invokana samples currently from his primary medical doctor, he will call for prescription when he runs out. - The concern in his case is his recent  weight gain which will definitely make control of diabetes more difficult. If he loses more control, he would be considered for incretin therapy or basal insulin .  2. Hyperlipidemia: I advised him to continue Pravastatin 40 mg po qhs.  3. Obesity Dietitian consult in progress. Encouraged to continue carbs restriction. I encouraged him to use [employer] facilities for water aerobics as well as stationary bikes.   4. Essential hypertension Controlled. Continue current meds including  ACEI, beta blocker, CCB.  5) vitamin D deficiency: I advised him to continue vitamin D 50,000 units weekly  Follow up plan: Return in about 3 months (around 01/25/2017) for follow up with pre-visit labs. With meter and logs for f/u.  Glade Lloyd, MD Phone: [phone]  Fax: [phone]   10/27/2016, 9:08 AM

## 2016-11-09 ENCOUNTER — Other Ambulatory Visit: Payer: Self-pay | Admitting: "Endocrinology

## 2016-11-22 DIAGNOSIS — N529 Male erectile dysfunction, unspecified: Secondary | ICD-10-CM | POA: Diagnosis not present

## 2016-12-09 ENCOUNTER — Encounter (HOSPITAL_COMMUNITY): Payer: Self-pay | Admitting: Emergency Medicine

## 2016-12-09 ENCOUNTER — Emergency Department (HOSPITAL_COMMUNITY)
Admission: EM | Admit: 2016-12-09 | Discharge: 2016-12-09 | Disposition: A | Discharge status: Home / Self Care | Payer: PPO | Attending: Emergency Medicine | Admitting: Emergency Medicine

## 2016-12-09 ENCOUNTER — Emergency Department (HOSPITAL_COMMUNITY): Payer: PPO

## 2016-12-09 DIAGNOSIS — I82411 Acute embolism and thrombosis of right femoral vein: Secondary | ICD-10-CM | POA: Insufficient documentation

## 2016-12-09 DIAGNOSIS — L03116 Cellulitis of left lower limb: Secondary | ICD-10-CM | POA: Diagnosis not present

## 2016-12-09 DIAGNOSIS — I10 Essential (primary) hypertension: Secondary | ICD-10-CM | POA: Insufficient documentation

## 2016-12-09 DIAGNOSIS — Z79899 Other long term (current) drug therapy: Secondary | ICD-10-CM | POA: Insufficient documentation

## 2016-12-09 DIAGNOSIS — I2510 Atherosclerotic heart disease of native coronary artery without angina pectoris: Secondary | ICD-10-CM | POA: Insufficient documentation

## 2016-12-09 DIAGNOSIS — E119 Type 2 diabetes mellitus without complications: Secondary | ICD-10-CM | POA: Insufficient documentation

## 2016-12-09 DIAGNOSIS — N3000 Acute cystitis without hematuria: Secondary | ICD-10-CM | POA: Diagnosis not present

## 2016-12-09 DIAGNOSIS — Z7984 Long term (current) use of oral hypoglycemic drugs: Secondary | ICD-10-CM | POA: Insufficient documentation

## 2016-12-09 DIAGNOSIS — I82431 Acute embolism and thrombosis of right popliteal vein: Secondary | ICD-10-CM | POA: Diagnosis not present

## 2016-12-09 DIAGNOSIS — R509 Fever, unspecified: Secondary | ICD-10-CM | POA: Diagnosis not present

## 2016-12-09 LAB — BASIC METABOLIC PANEL
Anion gap: 11 (ref 5–15)
BUN: 15 mg/dL (ref 6–20)
CHLORIDE: 98 mmol/L — AB (ref 101–111)
CO2: 28 mmol/L (ref 22–32)
CREATININE: 1.02 mg/dL (ref 0.61–1.24)
Calcium: 9.4 mg/dL (ref 8.9–10.3)
GFR calc non Af Amer: >60 mL/min (ref >60)
Glucose, Bld: 161 mg/dL — ABNORMAL HIGH (ref 65–99)
POTASSIUM: 3.7 mmol/L (ref 3.5–5.1)
Sodium: 137 mmol/L (ref 135–145)

## 2016-12-09 LAB — CBC WITH DIFFERENTIAL/PLATELET
BASOS PCT: 0 %
Basophils Absolute: 0 10*3/uL (ref 0.0–0.1)
Eosinophils Absolute: 0 10*3/uL (ref 0.0–0.7)
Eosinophils Relative: 0 %
HEMATOCRIT: 46.1 % (ref 39.0–52.0)
HEMOGLOBIN: 15.8 g/dL (ref 13.0–17.0)
LYMPHS ABS: 1 10*3/uL (ref 0.7–4.0)
Lymphocytes Relative: 5 %
MCH: 30.9 pg (ref 26.0–34.0)
MCHC: 34.3 g/dL (ref 30.0–36.0)
MCV: 90 fL (ref 78.0–100.0)
MONOS PCT: 4 %
Monocytes Absolute: 0.8 10*3/uL (ref 0.1–1.0)
NEUTROS ABS: 17.9 10*3/uL — AB (ref 1.7–7.7)
NEUTROS PCT: 91 %
Platelets: 188 10*3/uL (ref 150–400)
RBC: 5.12 MIL/uL (ref 4.22–5.81)
RDW: 16.1 % — ABNORMAL HIGH (ref 11.5–15.5)
WBC: 19.8 10*3/uL — AB (ref 4.0–10.5)

## 2016-12-09 LAB — URINALYSIS, ROUTINE W REFLEX MICROSCOPIC
Bilirubin Urine: NEGATIVE
Ketones, ur: NEGATIVE mg/dL
Nitrite: NEGATIVE
PH: 6 (ref 5.0–8.0)
Protein, ur: NEGATIVE mg/dL
SPECIFIC GRAVITY, URINE: 1.029 (ref 1.005–1.030)

## 2016-12-09 MED ORDER — CEPHALEXIN 500 MG PO CAPS: 500.0000 mg | ORAL_CAPSULE | Freq: Four times a day (QID) | ORAL | 0 refills | Status: DC

## 2016-12-09 MED ORDER — SODIUM CHLORIDE 0.9 % IV BOLUS (SEPSIS): 250.0000 mL | Freq: Once | INTRAVENOUS | Status: DC

## 2016-12-09 MED ORDER — TRAMADOL HCL 50 MG PO TABS: 50.0000 mg | ORAL_TABLET | Freq: Four times a day (QID) | ORAL | 0 refills | Status: DC | PRN

## 2016-12-09 MED ORDER — DEXTROSE 5 % IV SOLN
1.0000 g | Freq: Once | INTRAVENOUS | Status: AC
  Administered 2016-12-09: 1 g via INTRAVENOUS
  Filled 2016-12-09: qty 10

## 2016-12-09 MED ORDER — SODIUM CHLORIDE 0.9 % IV SOLN: INTRAVENOUS | Status: DC

## 2016-12-09 MED ORDER — SODIUM CHLORIDE 0.9 % IV BOLUS (SEPSIS)
500.0000 mL | Freq: Once | INTRAVENOUS | Status: AC
  Administered 2016-12-09: 500 mL via INTRAVENOUS

## 2016-12-09 MED ORDER — VANCOMYCIN HCL IN DEXTROSE 1-5 GM/200ML-% IV SOLN
1000.0000 mg | Freq: Once | INTRAVENOUS | Status: AC
Start: 2016-12-09 — End: 2016-12-09
  Administered 2016-12-09: 1000 mg via INTRAVENOUS
  Filled 2016-12-09: qty 200

## 2016-12-09 MED ORDER — ACETAMINOPHEN 325 MG PO TABS
650.0000 mg | ORAL_TABLET | Freq: Once | ORAL | Status: AC
  Administered 2016-12-09: 650 mg via ORAL
  Filled 2016-12-09: qty 2

## 2016-12-09 MED ORDER — DOXYCYCLINE HYCLATE 100 MG PO CAPS
100.0000 mg | ORAL_CAPSULE | Freq: Two times a day (BID) | ORAL | 0 refills | Status: DC
Start: 2016-12-09 — End: 2016-12-26

## 2016-12-09 NOTE — ED Provider Notes (Signed)
[facility] DEPT Provider Note   CSN: [id number] Arrival date & time: 12/09/16  3825  By signing my name below, I, Higinio Plan, attest that this documentation has been prepared under the direction and in the presence of Fredia Sorrow, MD . Electronically Signed: Higinio Plan, Scribe. 12/09/2016. 11:24 AM.  History   Chief Complaint Chief Complaint  Patient presents with  . Fever   The history is provided by the patient and the spouse. No language interpreter was used.   HPI Comments: CHIDIEBERE WYNN is a 71 y.o. male with PMHx of DM2, DVT on Eliquis, HTN, and stroke, who presents to the Emergency Department complaining of gradually improving, fever (TMAX 99.8 in the ED) and chills that began at ~7:00 PM last night. Pt reports associated sudden onset, gradually worsening, warmth, redness, and pain in his left leg that also began yesterday. Per wife, pt has hx of DVT in his bilateral legs that occurred in September 2016 and November 2017 that were diagnosed shortly after the onset of similar symptoms. Pt also notes symptoms of urinary frequency, urgency, and decreased urine output that began "months ago." He states he is not currently taking any antibiotics. Pt denies cough, congestion, sore throat, and sick contacts.   Past Medical History:  Diagnosis Date  . Abnormal EKG    Inferior Q waves  . BPH (benign prostatic hyperplasia)   . Coronary artery disease   . Degenerative joint disease   . Diabetes mellitus, type 2 (HCC)    No insulin  . DVT (deep venous thrombosis) ([place])   . Heart murmur   . Hepatic steatosis   . Hyperlipidemia   . 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 Meningioma (HCC)    Left frontal; CVA identified by MRI; no neurologic symptoms  . Neuropathy ([place])   . Obesity   . Sleep apnea 2008   Dr. Merlene Laughter interpreted the study-CPAP recommended, but refused by patient  . Stroke [facility])    no  deficits from stroke, "Didnt know I had one when they say I did".    Patient Active Problem List   Diagnosis Date Noted  . Vitamin D deficiency 01/21/2016  . DVT of leg (deep venous thrombosis) ([place]) 07/02/2015  . CAD (coronary artery disease), native coronary artery 06/05/2012  . Postsurgical percutaneous transluminal coronary angioplasty (PTCA) status 06/05/2012  . Hypertension   . Type 2 diabetes mellitus with vascular disease ([place])   . Sleep apnea   . Meningioma ([place])   . Hyperlipidemia   . Obesity   . Hepatic steatosis   . Abnormal EKG     Past Surgical History:  Procedure Laterality Date  . CATARACT EXTRACTION W/PHACO Left 04/03/2016   Procedure: CATARACT EXTRACTION PHACO AND INTRAOCULAR LENS PLACEMENT LEFT EYE CDE=21.76;  Surgeon: Williams Che, MD;  Location: [facility] ORS;  Service: Ophthalmology;  Laterality: Left;  . CATARACT EXTRACTION W/PHACO Right 06/19/2016   Procedure: CATARACT EXTRACTION PHACO AND INTRAOCULAR LENS PLACEMENT; CDE:  11.56;  Surgeon: Williams Che, MD;  Location: [facility] ORS;  Service: Ophthalmology;  Laterality: Right;  . COLONOSCOPY W/ POLYPECTOMY  05/2008   polypectomy x4-adenomatous; internal hemorrhoids  . CORONARY ANGIOPLASTY WITH STENT PLACEMENT  06/04/2012     DES    to [place]    . DUPUYTREN / PALMAR FASCIOTOMY  2009   rt hand-dsc-went home same day x2  . LEFT HEART CATHETERIZATION  WITH CORONARY ANGIOGRAM N/A 05/28/2012   Procedure: LEFT HEART CATHETERIZATION WITH CORONARY ANGIOGRAM;  Surgeon: Laverda Page, MD;  Location: [facility] CATH LAB;  Service: Cardiovascular;  Laterality: N/A;  . PERCUTANEOUS CORONARY STENT INTERVENTION (PCI-S) N/A 06/04/2012   Procedure: PERCUTANEOUS CORONARY STENT INTERVENTION (PCI-S);  Surgeon: Laverda Page, MD;  Location[facility] CATH LAB;  Service: Cardiovascular;  Laterality: N/A;  . TONSILLECTOMY    . VENTRAL HERNIA REPAIR  2010   umb hernia-[facility]-went home same day    Home Medications    Prior to  Admission medications   Medication Sig Start Date End Date Taking? Authorizing Provider  apixaban (ELIQUIS) 5 MG TABS tablet Take 1 tablet (5 mg total) by mouth 2 (two) times daily. For the first 7 days, take 2 tablets twice a day. Then begin 1 tablet twice a day. 08/21/16  Yes Nat Christen, MD  eplerenone (INSPRA) 25 MG tablet Take 25 mg by mouth daily.   Yes Historical Provider, MD  gabapentin (NEURONTIN) 100 MG capsule TAKE (1) CAPSULE BY MOUTH TWICE DAILY. 11/09/16  Yes Cassandria Anger, MD  INVOKANA 100 MG TABS tablet TAKE ONE TABLET BY MOUTH ONCE DAILY. 07/10/16  Yes Cassandria Anger, MD  lisinopril-hydrochlorothiazide (PRINZIDE,ZESTORETIC) 20-25 MG per tablet Take 1 tablet by mouth daily.   Yes Historical Provider, MD  metFORMIN (GLUCOPHAGE) 1000 MG tablet TAKE (1) TABLET BY MOUTH TWICE DAILY. 02/21/16  Yes Cassandria Anger, MD  Multiple Vitamins-Minerals (ONE-A-DAY 50 PLUS PO) Take 1 tablet by mouth daily.   Yes Historical Provider, MD  nebivolol (BYSTOLIC) 10 MG tablet Take 20 mg by mouth daily.   Yes Historical Provider, MD  nitroGLYCERIN (NITROSTAT) 0.4 MG SL tablet Place 0.4 mg under the tongue every 5 (five) minutes as needed for chest pain.   Yes Historical Provider, MD  pravastatin (PRAVACHOL) 40 MG tablet Take 40 mg by mouth daily.   Yes Historical Provider, MD  Tamsulosin HCl (FLOMAX) 0.4 MG CAPS Take 0.4 mg by mouth daily after breakfast.   Yes Historical Provider, MD  testosterone cypionate (DEPOTESTOTERONE CYPIONATE) 100 MG/ML injection Inject into the muscle every 28 (twenty-eight) days. For IM use only   Yes Historical Provider, MD  traZODone (DESYREL) 100 MG tablet Take 100 mg by mouth at bedtime.   Yes Historical Provider, MD  verapamil (CALAN-SR) 240 MG CR tablet Take 1 tablet (240 mg total) by mouth at bedtime. 05/12/13  Yes Yehuda Savannah, MD  Vitamin D, Ergocalciferol, (DRISDOL) 50000 units CAPS capsule TAKE 1 CAPSULE BY MOUTH ONCE A WEEK. 09/07/16  Yes  Cassandria Anger, MD  cephALEXin (KEFLEX) 500 MG capsule Take 1 capsule (500 mg total) by mouth 4 (four) times daily. 12/09/16   Fredia Sorrow, MD  doxycycline (VIBRAMYCIN) 100 MG capsule Take 1 capsule (100 mg total) by mouth 2 (two) times daily. 12/09/16   Fredia Sorrow, MD  traMADol (ULTRAM) 50 MG tablet Take 1 tablet (50 mg total) by mouth every 6 (six) hours as needed. 12/09/16   Fredia Sorrow, MD    Family History Family History  Problem Relation Age of Onset  . Cancer Mother   . Cancer Father   . Coronary artery disease Neg Hx     Social History Social History  Substance Use Topics  . Smoking status: Never Smoker  . Smokeless tobacco: Never Used  . Alcohol use No   Allergies   Patient has no known allergies.  Review of Systems Review of Systems  Constitutional: Positive for  chills and fever.  HENT: Negative for congestion and sore throat.   Eyes: Negative for visual disturbance.  Respiratory: Negative for cough and shortness of breath.   Cardiovascular: Positive for leg swelling. Negative for chest pain.  Gastrointestinal: Negative for abdominal pain, diarrhea, nausea and vomiting.  Genitourinary: Positive for decreased urine volume, frequency and urgency. Negative for hematuria.  Musculoskeletal: Negative for back pain.  Skin: Positive for rash.  Neurological: Negative for headaches.  Hematological: Bruises/bleeds easily.  All other systems reviewed and are negative.  Physical Exam Updated Vital Signs BP 146/73   Pulse 84   Temp 99.8 F (37.7 C) (Oral)   Resp 18   Ht 5\' 8"  (1.727 m)   Wt 265 lb (120.2 kg)   SpO2 95%   BMI 40.29 kg/m   Physical Exam  Constitutional: He is oriented to person, place, and time.  HENT:  Head: Normocephalic and atraumatic.  Mouth/Throat: Oropharynx is clear and moist.  Moist mucous membranes   Eyes: EOM are normal. Pupils are equal, round, and reactive to light. No scleral icterus.  Eyes are tracking normally    Cardiovascular: Normal rate, regular rhythm and normal heart sounds.   Pulmonary/Chest: Effort normal and breath sounds normal.  Lungs clear to auscultation bilaterally   Abdominal: Bowel sounds are normal. There is no tenderness.  Musculoskeletal: He exhibits edema. He exhibits no tenderness or deformity.  Trace edema in right leg and pitting edema in left leg.  Neurological: He is alert and oriented to person, place, and time. Coordination normal.  Skin: Capillary refill takes less than 2 seconds. There is erythema. No pallor.  Erythema to the top of foot and about 2/3 up the left leg.    ED Treatments / Results  DIAGNOSTIC STUDIES:  Oxygen Saturation is 97% on RA, normal by my interpretation.    COORDINATION OF CARE:  11:24 AM Discussed treatment plan with pt at bedside and pt agreed to plan.  Labs (all labs ordered are listed, but only abnormal results are displayed) Labs Reviewed  URINALYSIS, ROUTINE W REFLEX MICROSCOPIC - Abnormal; Notable for the following:       Result Value   Glucose, UA >=500 (*)    Hgb urine dipstick SMALL (*)    Leukocytes, UA MODERATE (*)    Bacteria, UA RARE (*)    All other components within normal limits  CBC WITH DIFFERENTIAL/PLATELET - Abnormal; Notable for the following:    WBC 19.8 (*)    RDW 16.1 (*)    Neutro Abs 17.9 (*)    All other components within normal limits  BASIC METABOLIC PANEL - Abnormal; Notable for the following:    Chloride 98 (*)    Glucose, Bld 161 (*)    All other components within normal limits  URINE CULTURE    EKG  EKG Interpretation None       Radiology US Venous Img Lower Bilateral  Result Date: 12/09/2016 CLINICAL DATA:  Left leg pain, history of deep venous thrombosis EXAM: BILATERAL LOWER EXTREMITY VENOUS DOPPLER ULTRASOUND TECHNIQUE: Gray-scale sonography with graded compression, as well as color Doppler and duplex ultrasound were performed to evaluate the lower extremity deep venous systems from  the level of the common femoral vein and including the common femoral, femoral, profunda femoral, popliteal and calf veins including the posterior tibial, peroneal and gastrocnemius veins when visible. The superficial great saphenous vein was also interrogated. Spectral Doppler was utilized to evaluate flow at rest and with distal augmentation maneuvers in  the common femoral, femoral and popliteal veins. COMPARISON:  12/10/2015 FINDINGS: RIGHT LOWER EXTREMITY Common Femoral Vein: No evidence of thrombus. Normal compressibility, respiratory phasicity and response to augmentation. Saphenofemoral Junction: No evidence of thrombus. Normal compressibility and flow on color Doppler imaging. Profunda Femoral Vein: No evidence of thrombus. Normal compressibility and flow on color Doppler imaging. Femoral Vein: Echogenic thrombus is noted within the superficial femoral vein. Popliteal Vein: Echogenic thrombus is noted within the popliteal vein. Calf Veins: No evidence of thrombus. Normal compressibility and flow on color Doppler imaging. Superficial Great Saphenous Vein: No evidence of thrombus. Normal compressibility and flow on color Doppler imaging. Venous Reflux:  None. Other Findings:  None. LEFT LOWER EXTREMITY Common Femoral Vein: No evidence of thrombus. Normal compressibility, respiratory phasicity and response to augmentation. Saphenofemoral Junction: No evidence of thrombus. Normal compressibility and flow on color Doppler imaging. Profunda Femoral Vein: No evidence of thrombus. Normal compressibility and flow on color Doppler imaging. Femoral Vein: No evidence of thrombus. Normal compressibility, respiratory phasicity and response to augmentation. Popliteal Vein: No evidence of thrombus. Normal compressibility, respiratory phasicity and response to augmentation. Calf Veins: No evidence of thrombus. Normal compressibility and flow on color Doppler imaging. Superficial Great Saphenous Vein: No evidence of thrombus.  Normal compressibility and flow on color Doppler imaging. Venous Reflux:  None. Other Findings:  None. IMPRESSION: Deep venous thrombosis within the right femoral and popliteal veins. Electronically Signed   By: Inez Catalina M.D.   On: 12/09/2016 14:16    Procedures Procedures (including critical care time)  Medications Ordered in ED Medications  cefTRIAXone (ROCEPHIN) 1 g in dextrose 5 % 50 mL IVPB (0 g Intravenous Stopped 12/09/16 1343)  sodium chloride 0.9 % bolus 500 mL (0 mLs Intravenous Stopped 12/09/16 1424)  vancomycin (VANCOCIN) IVPB 1000 mg/200 mL premix (0 mg Intravenous Stopped 12/09/16 1443)  acetaminophen (TYLENOL) tablet 650 mg (650 mg Oral Given 12/09/16 1424)    Initial Impression / Assessment and Plan / ED Course  I have reviewed the triage vital signs and the nursing notes.  Pertinent labs & imaging results that were available during my care of the patient were reviewed by me and considered in my medical decision making (see chart for details).     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 Patient is on long-term Eliquis. Patient without any chest pain or shortness of breath no clinical concerns for pulmonary embolus. Patient will continue his eloquent. Workup here today shows a new DVT in the right lower extremity and clearly clinical evidence of cellulitis in the left lower extremity. Patient's urinalysis also not completely normal. So patient's urine sent for culture. Patient treated with Rocephin and vancomycin here IV. Will be continued at home on Keflex and doxycycline trying to treat both the urine and the cellulitis. Patient will need to have close follow-up with his primary care doctor to determine whether he is going to be considered for a candidate for an  IVC filter.  Patient nontoxic here. However did describe fever and chills at home. Did have temp go up to 102 here as well. Patient still feels fine. Not hypotensive not tachycardic. Patient  does have a leukocytosis. Close follow-up with his primary care doctor as well as returning for any new or worse symptoms expressed to the patient's wife.  I personally performed the services described in this documentation, which was scribed in my presence. The recorded information has been reviewed and is accurate.     Final  Clinical Impressions(s) / ED Diagnoses   Final diagnoses:  Acute deep vein thrombosis (DVT) of femoral vein of right lower extremity (HCC)  Cellulitis of left lower extremity  Acute cystitis without hematuria    New Prescriptions New Prescriptions   CEPHALEXIN (KEFLEX) 500 MG CAPSULE    Take 1 capsule (500 mg total) by mouth 4 (four) times daily.   DOXYCYCLINE (VIBRAMYCIN) 100 MG CAPSULE    Take 1 capsule (100 mg total) by mouth 2 (two) times daily.   TRAMADOL (ULTRAM) 50 MG TABLET    Take 1 tablet (50 mg total) by mouth every 6 (six) hours as needed.     Fredia Sorrow, MD 12/09/16 1452

## 2016-12-09 NOTE — ED Notes (Signed)
Pt returned from US

## 2016-12-09 NOTE — ED Triage Notes (Signed)
Pt reports fever with increased urination and difficulty controlling urine for the past few days.  Denies cough or any other symptoms related to fever.

## 2016-12-09 NOTE — ED Notes (Signed)
Pt made aware to return if symptoms worsen or if any life threatening symptoms occur.  Dr. Rogene Houston at bedside at this time speaking with pt before discharge.

## 2016-12-09 NOTE — ED Notes (Signed)
Pt c/o chills, fever and increased frequency in urination since last night.   Pt also c/o pain in lle. Erythema noted. Pt states he has history of dvt's in ble and is currently on Eliquis.

## 2016-12-09 NOTE — Discharge Instructions (Signed)
Make appointment to follow-up with your doctor on Monday or Tuesday. Take the antibiotic Keflex and the antibiotic doxycycline as directed. Continue your blood thinner. Return for any new or worse symptoms. Today's workup shows evidence of a right leg deep vein thrombosis. And no thrombosis in the left leg. However left leg findings consistent with cellulitis.  In addition as we discussed urinalysis raise some concerns for urinary tract infection. Urine culture is pending. The antibiotic Keflex is for the possible urinary tract infection. The antibiotic doxycycline is for the cellulitis. But both can be helpful.

## 2016-12-09 NOTE — ED Notes (Signed)
Dr. Rogene Houston speaking to pt prior to discharge, is aware of temp 102.2, is okay to send pt home at this time.

## 2016-12-11 ENCOUNTER — Other Ambulatory Visit (HOSPITAL_COMMUNITY)
Admission: RE | Admit: 2016-12-11 | Discharge: 2016-12-11 | Disposition: A | Discharge status: Home / Self Care | Payer: PPO | Source: Ambulatory Visit | Attending: Pulmonary Disease | Admitting: Pulmonary Disease

## 2016-12-11 DIAGNOSIS — N390 Urinary tract infection, site not specified: Secondary | ICD-10-CM | POA: Insufficient documentation

## 2016-12-11 LAB — URINE CULTURE

## 2016-12-12 LAB — URINALYSIS, ROUTINE W REFLEX MICROSCOPIC
BACTERIA UA: NONE SEEN
Bilirubin Urine: NEGATIVE
KETONES UR: NEGATIVE mg/dL
Nitrite: NEGATIVE
PROTEIN: NEGATIVE mg/dL
Specific Gravity, Urine: 1.025 (ref 1.005–1.030)
pH: 7 (ref 5.0–8.0)

## 2016-12-12 LAB — URINE CULTURE: CULTURE: NO GROWTH

## 2016-12-13 DIAGNOSIS — Z86711 Personal history of pulmonary embolism: Secondary | ICD-10-CM | POA: Diagnosis not present

## 2016-12-13 DIAGNOSIS — E1165 Type 2 diabetes mellitus with hyperglycemia: Secondary | ICD-10-CM | POA: Diagnosis not present

## 2016-12-13 DIAGNOSIS — I10 Essential (primary) hypertension: Secondary | ICD-10-CM | POA: Diagnosis not present

## 2016-12-19 ENCOUNTER — Other Ambulatory Visit: Payer: Self-pay | Admitting: "Endocrinology

## 2016-12-26 ENCOUNTER — Encounter (HOSPITAL_COMMUNITY): Payer: Self-pay | Admitting: Oncology

## 2016-12-26 ENCOUNTER — Encounter (HOSPITAL_COMMUNITY): Payer: PPO | Attending: Oncology | Admitting: Oncology

## 2016-12-26 VITALS — BP 114/69 | HR 70 | Resp 16 | Ht 66.0 in | Wt 265.0 lb

## 2016-12-26 DIAGNOSIS — Z86711 Personal history of pulmonary embolism: Secondary | ICD-10-CM | POA: Diagnosis not present

## 2016-12-26 DIAGNOSIS — Z7901 Long term (current) use of anticoagulants: Secondary | ICD-10-CM

## 2016-12-26 DIAGNOSIS — I82411 Acute embolism and thrombosis of right femoral vein: Secondary | ICD-10-CM

## 2016-12-26 DIAGNOSIS — I82413 Acute embolism and thrombosis of femoral vein, bilateral: Secondary | ICD-10-CM | POA: Insufficient documentation

## 2016-12-26 LAB — SEDIMENTATION RATE: SED RATE: 2 mm/h (ref 0–16)

## 2016-12-26 LAB — CBC WITH DIFFERENTIAL/PLATELET
BASOS ABS: 0 10*3/uL (ref 0.0–0.1)
BASOS PCT: 0 %
EOS PCT: 1 %
Eosinophils Absolute: 0.1 10*3/uL (ref 0.0–0.7)
HCT: 44.8 % (ref 39.0–52.0)
Hemoglobin: 15.2 g/dL (ref 13.0–17.0)
LYMPHS PCT: 32 %
Lymphs Abs: 2.9 10*3/uL (ref 0.7–4.0)
MCH: 30.8 pg (ref 26.0–34.0)
MCHC: 33.9 g/dL (ref 30.0–36.0)
MCV: 90.7 fL (ref 78.0–100.0)
MONO ABS: 0.9 10*3/uL (ref 0.1–1.0)
Monocytes Relative: 10 %
Neutro Abs: 5 10*3/uL (ref 1.7–7.7)
Neutrophils Relative %: 57 %
PLATELETS: 361 10*3/uL (ref 150–400)
RBC: 4.94 MIL/uL (ref 4.22–5.81)
RDW: 14.9 % (ref 11.5–15.5)
WBC: 8.9 10*3/uL (ref 4.0–10.5)

## 2016-12-26 LAB — COMPREHENSIVE METABOLIC PANEL
ALT: 22 U/L (ref 17–63)
ANION GAP: 11 (ref 5–15)
AST: 23 U/L (ref 15–41)
Albumin: 4 g/dL (ref 3.5–5.0)
Alkaline Phosphatase: 61 U/L (ref 38–126)
BUN: 24 mg/dL — ABNORMAL HIGH (ref 6–20)
CHLORIDE: 99 mmol/L — AB (ref 101–111)
CO2: 26 mmol/L (ref 22–32)
CREATININE: 1.12 mg/dL (ref 0.61–1.24)
Calcium: 9.7 mg/dL (ref 8.9–10.3)
GFR calc Af Amer: >60 mL/min (ref >60)
Glucose, Bld: 154 mg/dL — ABNORMAL HIGH (ref 65–99)
Potassium: 4.4 mmol/L (ref 3.5–5.1)
Sodium: 136 mmol/L (ref 135–145)
TOTAL PROTEIN: 7.1 g/dL (ref 6.5–8.1)
Total Bilirubin: 0.6 mg/dL (ref 0.3–1.2)

## 2016-12-26 LAB — IRON AND TIBC
Iron: 93 ug/dL (ref 45–182)
SATURATION RATIOS: 26 % (ref 17.9–39.5)
TIBC: 356 ug/dL (ref 250–450)
UIBC: 263 ug/dL

## 2016-12-26 LAB — C-REACTIVE PROTEIN: CRP: 1.2 mg/dL — AB (ref <1.0)

## 2016-12-26 LAB — LACTATE DEHYDROGENASE: LDH: 126 U/L (ref 98–192)

## 2016-12-26 LAB — FERRITIN: Ferritin: 91 ng/mL (ref 24–336)

## 2016-12-26 NOTE — Patient Instructions (Addendum)
[place] at [facility] Discharge Instructions  RECOMMENDATIONS MADE BY THE CONSULTANT AND ANY TEST RESULTS WILL BE SENT TO YOUR REFERRING PHYSICIAN.  You were seen today by Kirby Crigler PA-C. STOP taking Testosterone. Labs drawn today, we will call you with results. CT scan with contrast ordered. Continue taking Eliquis. Return in 4 weeks for follow up.    Thank you for choosing [place] at [facility] to provide your oncology and hematology care.  To afford each patient quality time with our provider, please arrive at least 15 minutes before your scheduled appointment time.    If you have a lab appointment with the [facility] please come in thru the  Main Entrance and check in at the main information desk  You need to re-schedule your appointment should you arrive 10 or more minutes late.  We strive to give you quality time with our providers, and arriving late affects you and other patients whose appointments are after yours.  Also, if you no show three or more times for appointments you may be dismissed from the clinic at the providers discretion.     Again, thank you for choosing [facility].  Our hope is that these requests will decrease the amount of time that you wait before being seen by our physicians.       _____________________________________________________________  Should you have questions after your visit to [facility], please contact our office at [phone] between the hours of 8:30 a.m. and 4:30 p.m.  Voicemails left after 4:30 p.m. will not be returned until the following business day.  For prescription refill requests, have your pharmacy contact our office.       Resources For Cancer Patients and their Caregivers ? American Cancer Society: Can assist with transportation, wigs, general needs, runs Look Good Feel Better.        (551)101-6538 ? Cancer Care: Provides financial  assistance, online support groups, medication/co-pay assistance.  1-800-813-HOPE 928-613-2685) ? [place] Assists [place] cancer patients and their families through emotional , educational and financial support.  (351) 138-6864 ? [place] DSS Where to apply for food stamps, Medicaid and utility assistance. 520-802-1059 ? [facility]: Transportation to medical appointments. 217-429-6190 ? Social Security Administration: May apply for disability if have a Stage IV cancer. 367-649-9121 561-438-2816 ? [employer], Disability and Transit Services: Assists with nutrition, care and transit needs. [place] Support Programs: @10RELATIVEDAYS @ > Cancer Support Group  2nd Tuesday of the month 1pm-2pm, Journey Room  > Creative Journey  3rd Tuesday of the month 1130am-1pm, Journey Room  > Look Good Feel Better  1st Wednesday of the month 10am-12 noon, Journey Room (Call [place] to register [phone])

## 2016-12-26 NOTE — Assessment & Plan Note (Addendum)
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  Vascular surgeon consult on 02/02/2017 reports resolution of left leg DVT and therefore Eliquis was discontinued and patient started prophylaxis therapy with ASA.  Of note, initial [place] was of both LE and therefore no Right DVT was identified in September 2016 and no hypercoagulable work-up completed.  Five months later, a right lower extremity doppler was performed illustrating a right lower extremity DVT extending from the right common femoral vein down to the right calf.  He was restarted on anticoagulation with Eliquis.  Repeat US, BILATERAL, on 12/09/2016 illustrates DVT within the right femoral and popliteal vein (of note, left DVT resolved/not identified).  No evidence of history of PE.  Patient now presents for further evaluation and recommendations regarding anticoagulation.  Of note, patient is on monthly Testosterone injections (unknown start date) but patient admits he is terribly noncompliant, even with his testosterone, and therefore its contribution to VTE is unknown in this particular situation.  Testosterone injections DISCONTINUED as this is an absolute risk factor.  Risk factors for DVT:  1. Obesity  2. Immobilization  3. Heart failure  4. Age > 65  5. Obesity  6. Androgen therapy  Acquired risk factors for predisposing conditions for thrombosis including prior thrombotic event, recent major surgery (specifically orthopedic), presence of central venous catheter, trauma, immobilization, malignancy, pregnancy, the use of oral contraceptives or heparin, myeloproliferative disorders, antiphospholipid syndrome (APS), and a number of major medical illnesses (heart failure, congenital  heart disease, age greater than or equal to 80 years, obesity, PNH, inflammatory bowel disease, nephrotic syndrome).    Many patients with an episode of VTE have more than 1 acquired risk factor from for thrombosis.  This was shown in a population-based study of the incidence of VTE and residents of [place], [place] during 1999.  6 most prevalent pre-existing medical characteristics of patients in the study were:  More than 48 hours of immobility in the preceding month-45%  Hospital admission in the past 3 months-39%  Surgery in the past 3 months-34%  Malignancy in the past 3 months-34%  Infection the past 3 months-34%  Current hospitalization-26%. Only 11% of the 587 episodes of VTE had none of the 6 characteristics present, 36 and 53% had 1-2 and >/= 3 risk factors, respectively.  The following coag tests are reliable in the setting of anticoagulation while on Xarelto, Pradaxa, or Eliquis:             1. Factor V Leiden             2. Prothombin 20210A             3. Anticardiolipin antibody             4. Anti-beta-glycoprotein-I antibody             5. Protein C+S free and total             6. Antithrombin antigen.  The following coag tests are not reliable in the setting of Xarelto, Pradaxa, or Eliquis:             1. Protein C+S activity (falsely normal)             2. Antithrombin activity (falsely elevated)  3. APC resistance (falsely negative)             4. Lupus anticoagulant tests (unknown their reliability in the setting of Xarelto, Pradaxa, Eliquis)  Unprovoked proximal DVT and symptomatic PE-The decision regarding the use of indefinite anticoagulation in patients with a first episode of unprovoked proximal deep vein thrombosis (DVT), unprovoked symptomatic pulmonary embolism (PE), or patients with active cancer is dependent upon patient-specific bleeding and thrombotic risks as well as the patient's values and preferences. For those with a low to  moderate bleeding risk, we suggest indefinite treatment rather than treatment for three to six months. For patients with a high bleeding risk, the benefits of indefinite anticoagulation are likely outweighed by the high risk of bleeding such that indefinite anticoagulation is not generally performed.  The rationale for indefinite anticoagulation in patients with an unprovoked proximal DVT or symptomatic PE is based upon the high estimated lifetime risk of recurrent VTE, which can be dramatically reduced by extending anticoagulation beyond the conventional three to six months. Importantly, most studies report a risk reduction in the rate of VTE recurrence at the expense of an increased rate of bleeding and without mortality benefit.  The estimated risk of recurrence following cessation of anticoagulation in patients with a first unprovoked episode of VTE is 10 percent at one year and 30 percent at five years (approximately 5 percent per year after the first year). Prior duration of therapy does not affect the risk of recurrence once anticoagulant therapy is discontinued. Full anticoagulation is associated with an over 90 percent reduction in the rate of recurrence compared with low-intensity anticoagulant regimens and aspirin which are less effective (approximately 60 and 30 percent rate reduction in recurrence respectively). This decrease in recurrence outweighs the rate of bleeding with full anticoagulation in most patients who are estimated to have a low (0.8 percent per year) or intermediate (1.6 percent per year) bleeding risk.  Labs today: CBC diff, CMET, iron/TIBC, ferritin, CRP, ESR, LDH, Factor V Leiden, Prothombin 20210A, Anticardiolipin antibodies, Anti-beta-glycoprotein-I antibody, Protein C+S free and total, and Antithrombin antigen.  D/C Testosterone injections.  Orders placed for CT CAP with contrast to evaluate for occult malignancy.  Return in 4 weeks for follow-up.  Continue Eliquis  daily.  Anticoagulation recommendations will follow at return appointment with above mentioned collected data.

## 2016-12-26 NOTE — Progress Notes (Signed)
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  HISTORY OF PRESENT ILLNESS:   BONNER LARUE is a 71 y.o. male with a medical history significant for coronary artery disease, hepatic steatosis, hyperlipidemia, hypertension, obesity, sleep apnea, type 2 diabetes with vascular disease, congestive heart failure, depression, hypertensive retinopathy, BPH, history of STEMI who is referred to the [facility] for recurrent lower extremity DVT.  Of note, chart is reviewed and there is mention of pulmonary embolism in the primary care physician's note but patient and his wife reports that there is no history of pulmonary embolism.  No imaging with and his [place] blank chart identifies a pulmonary embolism as well.  According to conversation with the patient, his wife, and review of his chart the following events occurred: In September 2016, patient developed a left lower extremity cellulitis.  He underwent bilateral lower extremity ultrasounds to evaluate for DVT and was identified to have a left femoral occlusive deep vein thrombosis extending to popliteal region.  He was started on Eliquis.  On September 16, 2015, repeat Doppler study was performed and persistent left lower extremity DVT was identified.  He continued on Eliquis.  Repeat ultrasound of left lower extremity on 12/10/2015 again identifies a persistent occlusive thrombus in the left femoral vein.  He continued on Eliquis.  He is referred to vascular surgery, Dr. Oneida Alar.  He was seen by the vascular surgery and according to  documentation from Dr. Nona Dell dictation, "patient had a left leg venous duplex exam today.  This shows resolution of most of the thrombus within his common femoral superficial femoral femoral popliteal veins.  There is still some residual thrombus in the calf veins as well.  Superficial venous system was widely patent without reflux in the left leg.  He has reflux in the deep femoral system."  At that appointment on 02/03/2016, anticoagulation with Eliquis was discontinued.  As result, the patient discontinued Eliquis and he started aspirin therapy for prophylaxis.  Patient was then noted to develop right leg discomfort which led to a right Doppler study on 08/21/2016 that demonstrated a right lower extremity DVT in the right common femoral vein extending down into the calf.  He was restarted on Eliquis.  Repeat imaging on 12/09/2016 of bilateral lower extremity ultrasounds demonstrates a persistent right DVT in the lower extremity and the right femoral and popliteal veins.  Of note, bilateral ultrasound on 12/09/2016 demonstrates resolution of left lower extremity DVT.  Patient is tolerating anticoagulation with Eliquis without any difficulties.  He denies any blood in his stools or dark stools.  He denies any chest pain or sudden onset of shortness of breath.  He is on testosterone injections monthly.  Symptomatically, the patient denies any improvement in his overall well-being.  He notes resolution of left leg pain.  He denies any hemoptysis, epistaxis, gross hematuria, B symptoms.  He does note intentional weight loss which is being done via dietary changes.  He notes a strong appetite.  Review of Systems  Constitutional: Positive for weight  loss (intentional, managed by dietary choices). Negative for chills and fever.  HENT: Negative.  Negative for nosebleeds.   Eyes: Negative.   Respiratory: Negative.  Negative for cough, hemoptysis and shortness of breath.   Cardiovascular: Negative.  Negative  for chest pain.  Gastrointestinal: Negative.  Negative for blood in stool, constipation, diarrhea, melena, nausea and vomiting.  Genitourinary: Negative.  Negative for hematuria.  Musculoskeletal: Negative.  Negative for falls.  Skin: Negative.   Neurological: Negative.  Negative for weakness.  Endo/Heme/Allergies: Negative.   Psychiatric/Behavioral: Negative.      PAST MEDICAL HISTORY:   Past Medical History:  Diagnosis Date  . Abnormal EKG    Inferior Q waves  . BPH (benign prostatic hyperplasia)   . Coronary artery disease   . Degenerative joint disease   . Diabetes mellitus, type 2 (HCC)    No insulin  . DVT (deep venous thrombosis) ([place])   . DVT of leg (deep venous thrombosis) ([place]) 07/02/2015  . Heart murmur   . Hepatic steatosis   . Hyperlipidemia   . 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 Meningioma (HCC)    Left frontal; CVA identified by MRI; no neurologic symptoms  . Neuropathy ([place])   . Obesity   . Sleep apnea 2008   Dr. Merlene Laughter interpreted the study-CPAP recommended, but refused by patient  . Stroke [facility])    no deficits from stroke, "Didnt know I had one when they say I did".    ALLERGIES: No Known Allergies    MEDICATIONS: I have reviewed the patient's current medications.    Current Outpatient Prescriptions on File Prior to Visit  Medication Sig Dispense Refill  . apixaban (ELIQUIS) 5 MG TABS tablet Take 1 tablet (5 mg total) by mouth 2 (two) times daily. For the first 7 days, take 2 tablets twice a day. Then begin 1 tablet twice a day. 90 tablet 0  . eplerenone (INSPRA) 25 MG tablet Take 25 mg by mouth daily.    Marland Kitchen gabapentin (NEURONTIN) 100 MG capsule TAKE (1) CAPSULE BY MOUTH TWICE DAILY. 60 capsule 2  . INVOKANA 100 MG TABS tablet TAKE ONE TABLET BY MOUTH ONCE DAILY. 30 tablet 2  . lisinopril-hydrochlorothiazide (PRINZIDE,ZESTORETIC) 20-25 MG per tablet Take 1 tablet by mouth  daily.    . metFORMIN (GLUCOPHAGE) 1000 MG tablet TAKE (1) TABLET BY MOUTH TWICE DAILY. 60 tablet 3  . Multiple Vitamins-Minerals (ONE-A-DAY 50 PLUS PO) Take 1 tablet by mouth daily.    . nebivolol (BYSTOLIC) 10 MG tablet Take 20 mg by mouth daily.    . pravastatin (PRAVACHOL) 40 MG tablet Take 40 mg by mouth daily.    . Tamsulosin HCl (FLOMAX) 0.4 MG CAPS Take 0.4 mg by mouth daily after breakfast.    . testosterone cypionate (DEPOTESTOTERONE CYPIONATE) 100 MG/ML injection Inject into the muscle every 28 (twenty-eight) days. For IM use only    . traMADol (ULTRAM) 50 MG tablet Take 1 tablet (50 mg total) by mouth every 6 (six) hours as needed. 15 tablet 0  . traZODone (DESYREL) 100 MG tablet Take 100 mg by mouth at bedtime.    . verapamil (CALAN-SR) 240 MG CR tablet Take 1 tablet (240 mg total) by mouth at bedtime. 90 tablet 0  . Vitamin D, Ergocalciferol, (DRISDOL) 50000 units CAPS capsule TAKE 1 CAPSULE BY MOUTH ONCE A WEEK. 12 capsule 0  . nitroGLYCERIN (NITROSTAT) 0.4 MG SL tablet Place  0.4 mg under the tongue every 5 (five) minutes as needed for chest pain.     No current facility-administered medications on file prior to visit.      PAST SURGICAL HISTORY Past Surgical History:  Procedure Laterality Date  . CATARACT EXTRACTION W/PHACO Left 04/03/2016   Procedure: CATARACT EXTRACTION PHACO AND INTRAOCULAR LENS PLACEMENT LEFT EYE CDE=21.76;  Surgeon: Williams Che, MD;  Location: [facility];  Service: Ophthalmology;  Laterality: Left;  . CATARACT EXTRACTION W/PHACO Right 06/19/2016   Procedure: CATARACT EXTRACTION PHACO AND INTRAOCULAR LENS PLACEMENT; CDE:  11.56;  Surgeon: Williams Che, MD;  Location: [facility];  Service: Ophthalmology;  Laterality: Right;  . COLONOSCOPY W/ POLYPECTOMY  05/2008   polypectomy x4-adenomatous; internal hemorrhoids  . CORONARY ANGIOPLASTY WITH STENT PLACEMENT  06/04/2012     DES    to [place]    . DUPUYTREN / PALMAR FASCIOTOMY  2009    rt hand-dsc-went home same day x2  . LEFT HEART CATHETERIZATION WITH CORONARY ANGIOGRAM N/A 05/28/2012   Procedure: LEFT HEART CATHETERIZATION WITH CORONARY ANGIOGRAM;  Surgeon: Laverda Page, MD;  Location: [facility] CATH LAB;  Service: Cardiovascular;  Laterality: N/A;  . PERCUTANEOUS CORONARY STENT INTERVENTION (PCI-S) N/A 06/04/2012   Procedure: PERCUTANEOUS CORONARY STENT INTERVENTION (PCI-S);  Surgeon: Laverda Page, MD;  Location: [facility] CATH LAB;  Service: Cardiovascular;  Laterality: N/A;  . TONSILLECTOMY    . VENTRAL HERNIA REPAIR  2010   umb hernia-[facility]-went home same day    FAMILY HISTORY: Family History  Problem Relation Age of Onset  . Cancer Mother   . Cancer Father   . Coronary artery disease Neg Hx    Mother deceased at the age of 74 secondary to brain cancer (glioblastoma). Father deceased at the age of 15 secondary to complications of prostate cancer. He is an only child. One daughter age 67 with obesity One daughter age 71 breast cancer survivor. 2 grandchildren, ages 13 and 46.  Both healthy.  Patient denies a family history of blood clots.  SOCIAL HISTORY:  reports that he has never smoked. He has never used smokeless tobacco. He reports that he does not drink alcohol or use drugs.  He denies any illicit drug abuse.  He is [employer] and religion.  He is married.  He is "semiretired" from [place].  He worked in Teacher, adult education.  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  Social History   Social History  . Marital status: Married    Spouse name: N/A  . Number of children: N/A  . Years of education: N/A   Occupational History  . Manufacturing engineer   Social History Main Topics  . Smoking status: Never Smoker  . Smokeless tobacco: Never Used  . Alcohol use No  . Drug use: No  . Sexual activity: Not Currently    Birth control/ protection: None   Other Topics Concern  . None   Social History Narrative    . None    PERFORMANCE STATUS: The patient's performance status is 2 - Symptomatic, <50% confined to bed  PHYSICAL EXAM: Most Recent Vital Signs: Blood pressure 114/69, pulse 70, resp. rate 16, height '5\' 6"'$  (1.676 m), weight 265 lb (120.2 kg), SpO2 96 %. BP 114/69 (BP Location: Left Arm, Patient Position: Sitting)   Pulse 70   Resp 16   Ht '5\' 6"'$  (1.676 m)   Wt 265 lb (  120.2 kg)   SpO2 96%   BMI 42.77 kg/m   General Appearance:    Alert, cooperative, no distress, appears stated age, accompanied by his wife, Agustin Cree, employee at [employer] and patient at the [facility] (H/O Breast Cancer)  Head:    Normocephalic, without obvious abnormality, atraumatic  Eyes:    Conjunctiva/corneas clear, EOM's intact, both eyes       Ears:    Not examined  Nose:   Nares normal, septum midline, mucosa normal.  Throat:   Lips, mucosa, and tongue normal.  Neck:   Supple, symmetrical, trachea midline, no adenopathy.  Back:     Symmetric, no curvature, ROM normal, no CVA tenderness  Lungs:     Clear to auscultation bilaterally, respirations unlabored  Chest wall:    No tenderness or deformity  Heart:    Regular rate and rhythm, S1 and S2 normal, no murmur, rub   or gallop  Abdomen:     Soft, non-tender, bowel sounds active all four quadrants,    no masses, no organomegaly  Genitalia:    Not examined  Rectal:    Not examined  Extremities:   Extremities normal, atraumatic, no cyanosis or edema  Pulses:   2+ and symmetric all extremities  Skin:   Skin color, texture, turgor normal, no rashes or lesions  Lymph nodes:   Cervical, supraclavicular, and axillary nodes normal  Neurologic:   CNII-XII intact. Normal strength, sensation and reflexes      throughout    LABORATORY DATA:  No results found for this or any previous visit (from the past 48 hour(s)).    RADIOGRAPHY: As documented within the chart.    PATHOLOGY:  N/A  ASSESSMENT/PLAN:   DVT of leg (deep venous  thrombosis) (HCC) Left lower extremity DVT identified on 06/17/2015 after long travel to [place] involving the left common femoral vein extending through the femoral vein popliteal vein and calf veins (occlusive) treated with ~ 9 months of anticoagulation (Eliquis) with persistence of DVT in left leg as late as 12/10/2015 on US imaging.  Vascular surgeon consult on 02/02/2017 reports resolution of left leg DVT and therefore Eliquis was discontinued and patient started prophylaxis therapy with ASA.  Of note, initial [place] was of both LE and therefore no Right DVT was identified in September 2016 and no hypercoagulable work-up completed.  Five months later, a right lower extremity doppler was performed illustrating a right lower extremity DVT extending from the right common femoral vein down to the right calf.  He was restarted on anticoagulation with Eliquis.  Repeat US, BILATERAL, on 12/09/2016 illustrates DVT within the right femoral and popliteal vein (of note, left DVT resolved/not identified).  No evidence of history of PE.  Patient now presents for further evaluation and recommendations regarding anticoagulation.  Of note, patient is on monthly Testosterone injections (unknown start date) but patient admits he is terribly noncompliant, even with his testosterone, and therefore its contribution to VTE is unknown in this particular situation.  Testosterone injections DISCONTINUED as this is an absolute risk factor.  Risk factors for DVT:  1. Obesity  2. Immobilization  3. Heart failure  4. Age > 65  5. Obesity  6. Androgen therapy  Acquired risk factors for predisposing conditions for thrombosis including prior thrombotic event, recent major surgery (specifically orthopedic), presence of central venous catheter, trauma, immobilization, malignancy, pregnancy, the use of oral contraceptives or heparin, myeloproliferative disorders, antiphospholipid syndrome (APS), and a number of major medical  illnesses (heart  failure, congenital heart disease, age greater than or equal to 54 years, obesity, PNH, inflammatory bowel disease, nephrotic syndrome).    Many patients with an episode of VTE have more than 1 acquired risk factor from for thrombosis.  This was shown in a population-based study of the incidence of VTE and residents of [place], [place] during 1999.  6 most prevalent pre-existing medical characteristics of patients in the study were:  More than 48 hours of immobility in the preceding month-45%  Hospital admission in the past 3 months-39%  Surgery in the past 3 months-34%  Malignancy in the past 3 months-34%  Infection the past 3 months-34%  Current hospitalization-26%. Only 11% of the 587 episodes of VTE had none of the 6 characteristics present, 36 and 53% had 1-2 and >/= 3 risk factors, respectively.  The following coag tests are reliable in the setting of anticoagulation while on Xarelto, Pradaxa, or Eliquis:             1. Factor V Leiden             2. Prothombin 20210A             3. Anticardiolipin antibody             4. Anti-beta-glycoprotein-I antibody             5. Protein C+S free and total             6. Antithrombin antigen.  The following coag tests are not reliable in the setting of Xarelto, Pradaxa, or Eliquis:             1. Protein C+S activity (falsely normal)             2. Antithrombin activity (falsely elevated)             3. APC resistance (falsely negative)             4. Lupus anticoagulant tests (unknown their reliability in the setting of Xarelto, Pradaxa, Eliquis)  Unprovoked proximal DVT and symptomatic PE-The decision regarding the use of indefinite anticoagulation in patients with a first episode of unprovoked proximal deep vein thrombosis (DVT), unprovoked symptomatic pulmonary embolism (PE), or patients with active cancer is dependent upon patient-specific bleeding and thrombotic risks as well as the patient's values and preferences. For those  with a low to moderate bleeding risk, we suggest indefinite treatment rather than treatment for three to six months. For patients with a high bleeding risk, the benefits of indefinite anticoagulation are likely outweighed by the high risk of bleeding such that indefinite anticoagulation is not generally performed.  The rationale for indefinite anticoagulation in patients with an unprovoked proximal DVT or symptomatic PE is based upon the high estimated lifetime risk of recurrent VTE, which can be dramatically reduced by extending anticoagulation beyond the conventional three to six months. Importantly, most studies report a risk reduction in the rate of VTE recurrence at the expense of an increased rate of bleeding and without mortality benefit.  The estimated risk of recurrence following cessation of anticoagulation in patients with a first unprovoked episode of VTE is 10 percent at one year and 30 percent at five years (approximately 5 percent per year after the first year). Prior duration of therapy does not affect the risk of recurrence once anticoagulant therapy is discontinued. Full anticoagulation is associated with an over 90 percent reduction in the rate of recurrence compared with low-intensity anticoagulant regimens and aspirin which  are less effective (approximately 60 and 30 percent rate reduction in recurrence respectively). This decrease in recurrence outweighs the rate of bleeding with full anticoagulation in most patients who are estimated to have a low (0.8 percent per year) or intermediate (1.6 percent per year) bleeding risk.  Labs today: CBC diff, CMET, iron/TIBC, ferritin, CRP, ESR, LDH, Factor V Leiden, Prothombin 20210A, Anticardiolipin antibodies, Anti-beta-glycoprotein-I antibody, Protein C+S free and total, and Antithrombin antigen.  D/C Testosterone injections.  Orders placed for CT CAP with contrast to evaluate for occult malignancy.  Return in 4 weeks for follow-up.  Continue  Eliquis daily.  Anticoagulation recommendations will follow at return appointment with above mentioned collected data.   ORDERS PLACED FOR THIS ENCOUNTER: Orders Placed This Encounter  Procedures  . CT Abdomen Pelvis W Contrast  . CT Chest W Contrast  . CBC with Differential  . Comprehensive metabolic panel  . Iron and TIBC  . Ferritin  . C-reactive protein  . Sedimentation rate  . Lactate dehydrogenase  . Factor 5 leiden  . Prothrombin gene mutation  . Cardiolipin antibodies, IgG, IgM, IgA  . Beta-2-glycoprotein i abs, IgG/M/A  . Protein C, total  . Protein S, total and free  . Antithrombin III antigen    MEDICATIONS PRESCRIBED THIS ENCOUNTER: Meds ordered this encounter  Medications  . DISCONTD: eplerenone (INSPRA) 50 MG tablet  . torsemide (DEMADEX) 20 MG tablet    All questions were answered. The patient knows to call the clinic with any problems, questions or concerns. We can certainly see the patient much sooner if necessary.  Patient discussed with Dr. Talbert Cage and together we ascertained an up-to-date interval history, and examined the patient.  Dr. Talbert Cage developed the patient's assessment and plan.  This was a shared visit-consultation.  Her attestation will follow below.  This note is electronically signed by: Doy Mince 12/26/2016 4:55 PM

## 2016-12-27 DIAGNOSIS — I10 Essential (primary) hypertension: Secondary | ICD-10-CM | POA: Diagnosis not present

## 2016-12-27 DIAGNOSIS — I82401 Acute embolism and thrombosis of unspecified deep veins of right lower extremity: Secondary | ICD-10-CM | POA: Diagnosis not present

## 2016-12-27 DIAGNOSIS — E1165 Type 2 diabetes mellitus with hyperglycemia: Secondary | ICD-10-CM | POA: Diagnosis not present

## 2016-12-28 LAB — CARDIOLIPIN ANTIBODIES, IGG, IGM, IGA
ANTICARDIOLIPIN IGM: 22 [MPL'U]/mL — AB (ref 0–12)
Anticardiolipin IgG: <9 GPL U/mL (ref 0–14)

## 2016-12-28 LAB — BETA-2-GLYCOPROTEIN I ABS, IGG/M/A
Beta-2 Glyco I IgG: <9 GPI IgG units (ref 0–20)
Beta-2-Glycoprotein I IgM: <9 GPI IgM units (ref 0–32)

## 2017-01-01 LAB — FACTOR 5 LEIDEN

## 2017-01-09 ENCOUNTER — Ambulatory Visit (HOSPITAL_COMMUNITY)
Admission: RE | Admit: 2017-01-09 | Discharge: 2017-01-09 | Disposition: A | Discharge status: Home / Self Care | Payer: PPO | Source: Ambulatory Visit | Attending: Oncology | Admitting: Oncology

## 2017-01-09 DIAGNOSIS — N200 Calculus of kidney: Secondary | ICD-10-CM | POA: Insufficient documentation

## 2017-01-09 DIAGNOSIS — D3502 Benign neoplasm of left adrenal gland: Secondary | ICD-10-CM | POA: Diagnosis not present

## 2017-01-09 DIAGNOSIS — I82413 Acute embolism and thrombosis of femoral vein, bilateral: Secondary | ICD-10-CM | POA: Insufficient documentation

## 2017-01-09 DIAGNOSIS — I2510 Atherosclerotic heart disease of native coronary artery without angina pectoris: Secondary | ICD-10-CM | POA: Insufficient documentation

## 2017-01-09 DIAGNOSIS — N400 Enlarged prostate without lower urinary tract symptoms: Secondary | ICD-10-CM | POA: Insufficient documentation

## 2017-01-09 MED ORDER — IOPAMIDOL (ISOVUE-300) INJECTION 61%
100.0000 mL | Freq: Once | INTRAVENOUS | Status: AC | PRN
  Administered 2017-01-09: 100 mL via INTRAVENOUS

## 2017-01-11 ENCOUNTER — Other Ambulatory Visit: Payer: Self-pay | Admitting: "Endocrinology

## 2017-01-12 ENCOUNTER — Telehealth (HOSPITAL_COMMUNITY): Payer: Self-pay

## 2017-01-12 NOTE — Telephone Encounter (Signed)
-----   Message from Baird Cancer, PA-C sent at 01/11/2017  4:01 PM EDT ----- Will review it all when he returns for follow-up TK ----- Message ----- From: Darlina Guys, LPN Sent: 0/14/1030   4:45 PM To: Baird Cancer, PA-C  Wife wants to know what the results of his Protein C and Protein S are. I don't see where they were ordered. I gave her the results of the prothrombin gene mutation (negative) and told her I could not explain the one mutation id'd in the factor 5 test.  MB

## 2017-01-12 NOTE — Telephone Encounter (Signed)
Notified patients wife who verbalized understanding. 

## 2017-01-15 IMAGING — US US EXTREM LOW VENOUS*L*
1 series · 13 of 24 positions shown · non-contrast
Comparison: 06/17/2015

CLINICAL DATA: Extensive LEFT lower extremity DVT, followup, on
Eliquis



[Series 1: us extrem low venous*left* · 0.09mm/px · 13 of 42 slices shown]
[im 1/42]
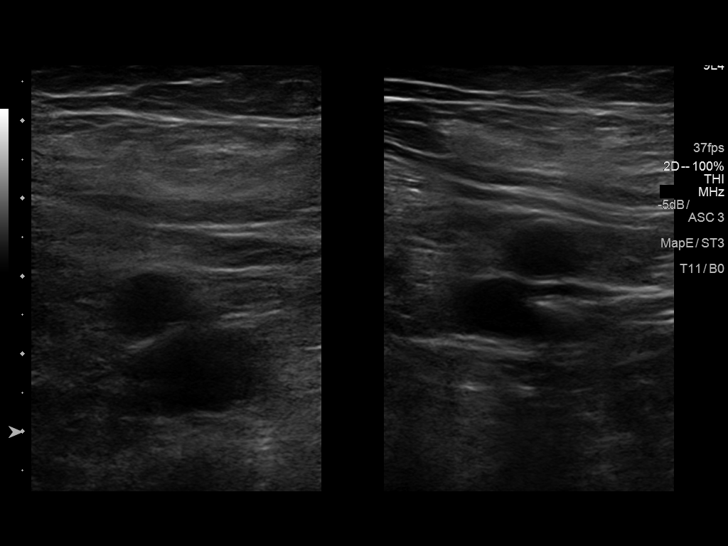
[im 4/42]
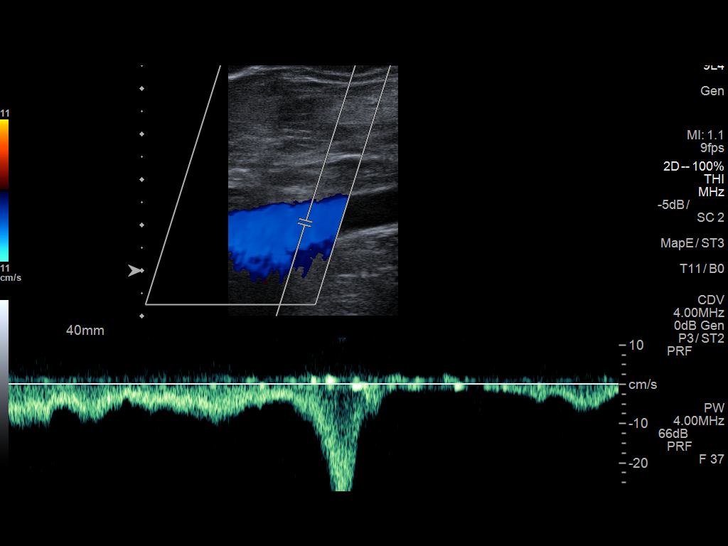
[im 8/42]
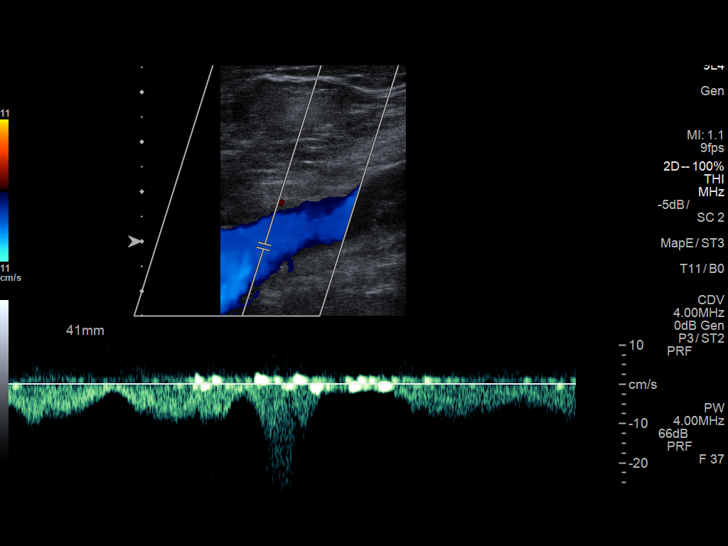
[im 11/42]
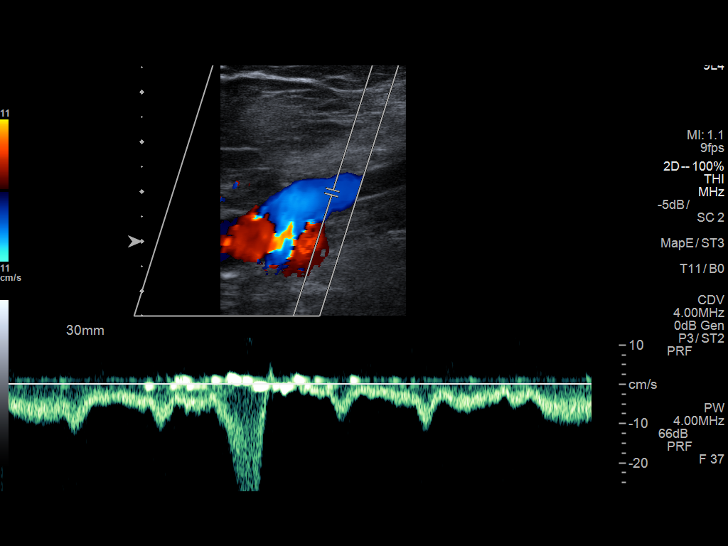
[im 15/42]
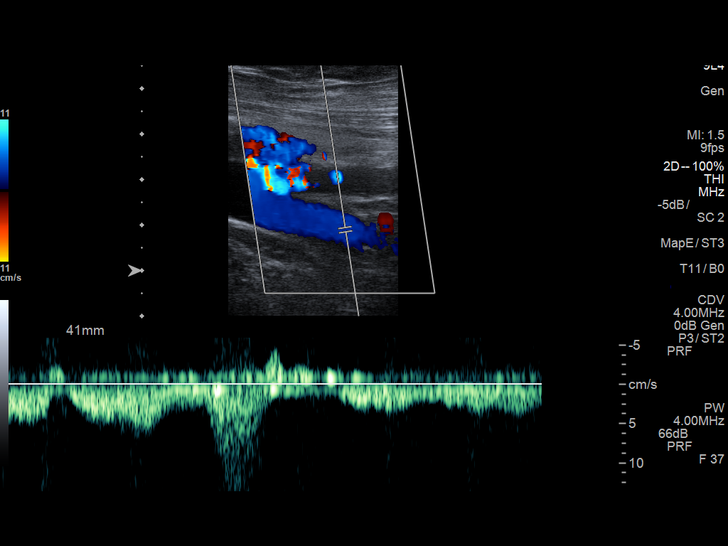
[im 18/42]
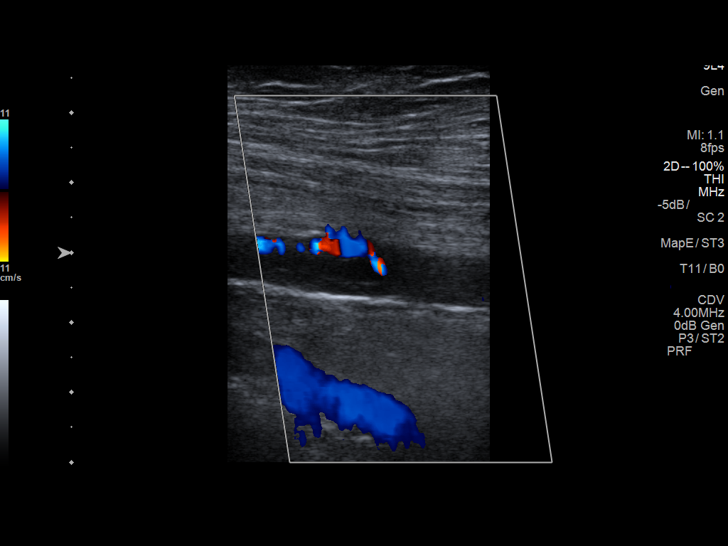
[im 22/42]
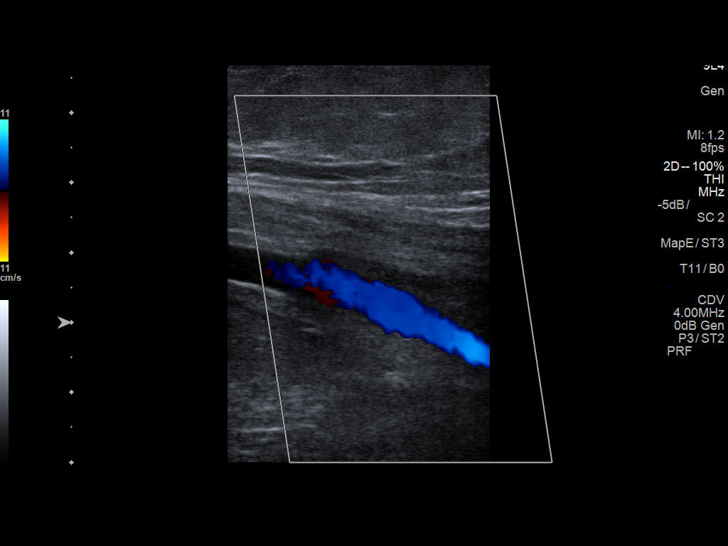
[im 24/42]
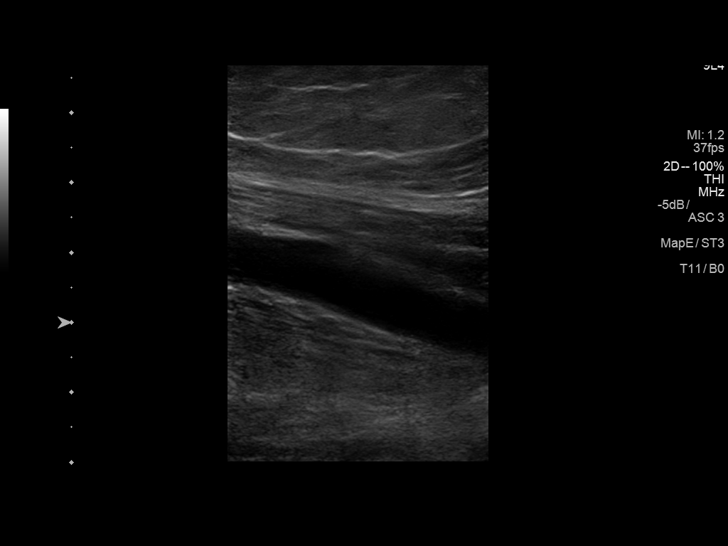
[im 27/42]
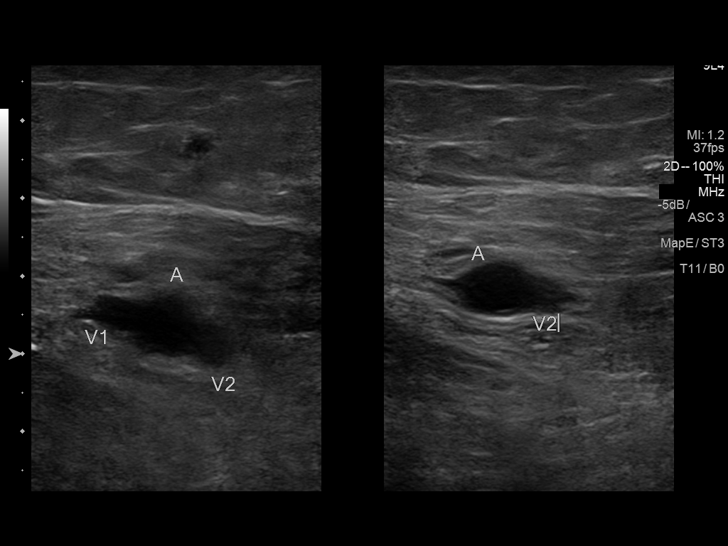
[im 31/42]
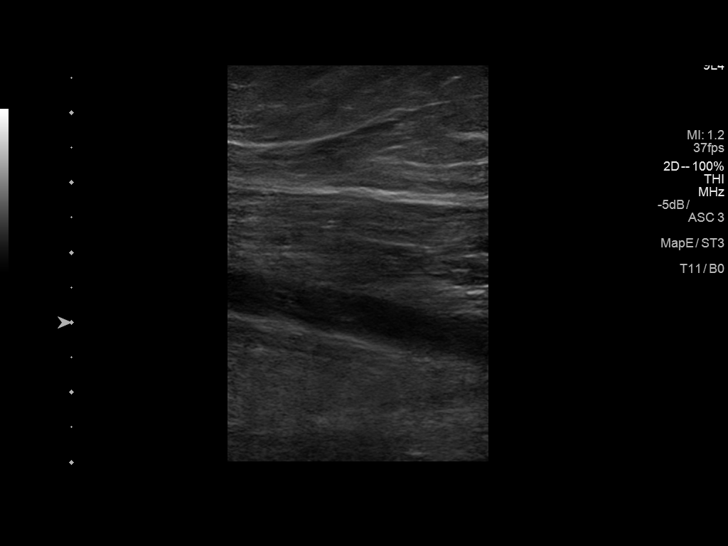
[im 34/42]
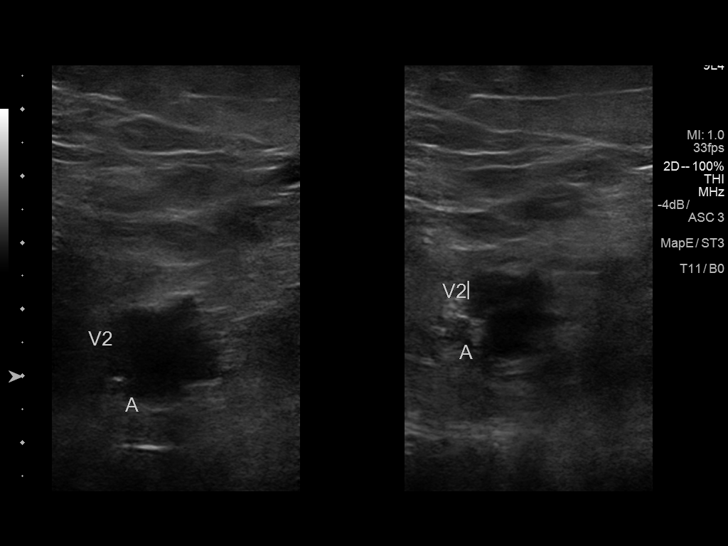
[im 38/42]
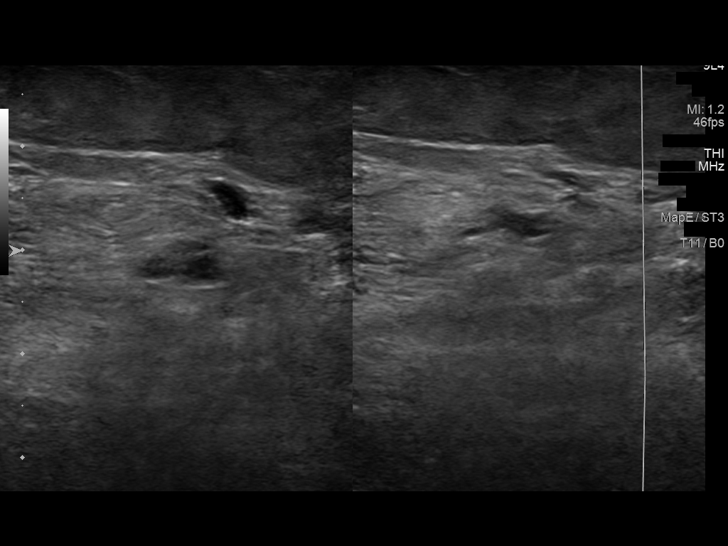
[im 42/42]
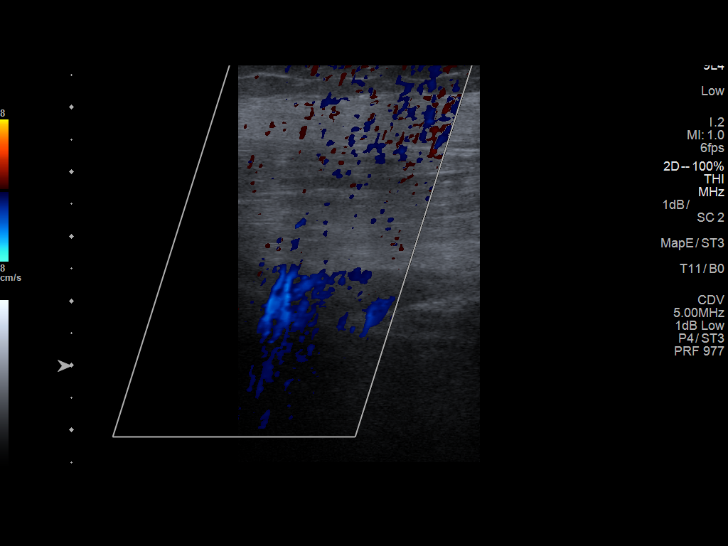

[13 of 24 positions shown; findings below may reference images not displayed]

FINDINGS: Contralateral Common Femoral Vein: Respiratory phasicity is normal
and symmetric with the symptomatic side. No evidence of thrombus.
Normal compressibility.

Common Femoral Vein: No evidence of thrombus. Normal
compressibility, respiratory phasicity and response to augmentation.

Saphenofemoral Junction: No evidence of thrombus. Normal
compressibility and flow on color Doppler imaging.

Profunda Femoral Vein: No evidence of thrombus. Normal
compressibility and flow on color Doppler imaging.

Femoral Vein: Duplicated. Chronic thrombus identified within 1 of 2
LEFT femoral veins with impaired compressibility and diminished
spontaneous venous flow. Second femoral vein is compressible and
patent demonstrating spontaneous flow.

Popliteal Vein: Contains intraluminal thrombus, absent spontaneous
flow and is incompressible compatible with persistent thrombosis.

Calf Veins: Limited visualization due to edema and body habitus.

Superficial Great Saphenous Vein: No evidence of thrombus. Normal
compressibility and flow on color Doppler imaging.

Venous Reflux:  None.

Other Findings:  None.
IMPRESSION: Persistent deep venous thrombosis identified within LEFT popliteal
vein and within 1 of 2 paired LEFT femoral veins.

## 2017-01-16 ENCOUNTER — Encounter (HOSPITAL_COMMUNITY): Payer: PPO | Attending: Oncology | Admitting: Oncology

## 2017-01-16 ENCOUNTER — Ambulatory Visit (HOSPITAL_COMMUNITY): Payer: PPO

## 2017-01-16 ENCOUNTER — Encounter (HOSPITAL_COMMUNITY): Payer: Self-pay | Admitting: Oncology

## 2017-01-16 VITALS — BP 138/59 | HR 56 | Resp 18 | Ht 68.0 in | Wt 270.0 lb

## 2017-01-16 DIAGNOSIS — I82413 Acute embolism and thrombosis of femoral vein, bilateral: Secondary | ICD-10-CM

## 2017-01-16 DIAGNOSIS — D6851 Activated protein C resistance: Secondary | ICD-10-CM | POA: Diagnosis not present

## 2017-01-16 DIAGNOSIS — Z7901 Long term (current) use of anticoagulants: Secondary | ICD-10-CM | POA: Diagnosis not present

## 2017-01-16 HISTORY — DX: Activated protein C resistance: D68.51

## 2017-01-16 HISTORY — DX: Long term (current) use of anticoagulants: Z79.01

## 2017-01-16 NOTE — Assessment & Plan Note (Addendum)
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  Both are low thrombophilia risks, but patient has other risk factors including obesity, history of testosterone replacement therapy, and immobility.   CT imaging of chest, abdomen, and pelvis are negative for occult malignancy.  Due to recurrence of DVT, recommend lifelong anticoagulation.  His DVT history is as follows:  According to conversation with the patient, his wife, and review of his chart the following events occurred: In September 2016, patient developed a left lower extremity cellulitis after a long flight to [place].  He underwent bilateral lower extremity ultrasounds to evaluate for DVT and was identified to have a left femoral occlusive deep vein thrombosis extending to popliteal region.  He was started on Eliquis.  On September 16, 2015, repeat Doppler study was performed and persistent left lower extremity DVT was identified.  He continued on Eliquis.  Repeat ultrasound of left lower extremity on 12/10/2015 again identifies a persistent occlusive thrombus in the left femoral vein.  He continued on Eliquis.  He is referred to vascular surgery, Dr. Oneida Alar.  He was seen by the vascular surgery and according to documentation from Dr. Nona Dell dictation, "patient had a left leg venous duplex exam today.  This shows resolution of most of the thrombus within his common femoral superficial femoral femoral popliteal veins.  There is still some residual thrombus in the calf veins as well.  Superficial venous system was widely patent without reflux in the left leg.  He has reflux in the deep femoral system."  At that appointment on 02/03/2016, anticoagulation with Eliquis was discontinued.  As result, the patient discontinued Eliquis and he started aspirin therapy for prophylaxis.  Patient was then noted to develop right leg discomfort  which led to a right Doppler study on 08/21/2016 that demonstrated a right lower extremity DVT in the right common femoral vein extending down into the calf.  He was restarted on Eliquis.  Repeat imaging on 12/09/2016 of bilateral lower extremity ultrasounds demonstrates a persistent right DVT in the lower extremity and the right femoral and popliteal veins.  Of note, bilateral ultrasound on 12/09/2016 demonstrates resolution of left lower extremity DVT.  I personally reviewed and went over laboratory results with the patient.  The results are noted within this dictation.  I reviewed his labs from 12/26/2016: CBC diff, CMET, iron/TIBC, ferritin, CRP, ESR, LDH, Factor V leiden, prothrombin gene mutation, anticardiolipin antibodies IgG, IgM, IgA, and Beta-2 glycoprotein antibodies IgG, IgM, IgA.  Protein C total and Protein S free+ total were not drawn, despite being ordered at time of consultation.  Will draw with his next lab appointment for completeness sake, but will not alter anticoagulation recommendations at this time.  I have reviewed Factor V leiden heterozygosity and its low thrombophilia risk.   Patients with Factor V Leiden heterozygosity are managed similarly as the general population.  There is no high-quality data that aspirin or an anticoagulant reduces the risk of VTE in this patient population.  I reviewed his low-medium IgM anticardiolipin (aCL) positivity.  Clinically significant antiphospholipid antibody (aPL) profile is defined as the presence of one or more of the following aPLs on two or more occassions at least 12 weeks apart:  1. A positive LA test, based on the guidelines of international Society of Thrombosis and Haemostasis  2. aCL IgG or IgM, with a titer > 40 units  3. anti-beta-glycoprotein I IgG or IgM, with a  titer > 40 units.    Given that his IgM aCL was 22, this is clinically insignificant.    Despite this information, given his recurrent DVT with the second DVT being  unprovoked, we recommend lifelong anticoaguation.  He is tolerating well.  Unprovoked proximal DVT and symptomatic PE-The decision regarding the use of indefinite anticoagulation in patients with a first episode of unprovoked proximal deep vein thrombosis (DVT), unprovoked symptomatic pulmonary embolism (PE), or patients with active cancer is dependent upon patient-specific bleeding and thrombotic risks as well as the patient's values and preferences. For those with a low to moderate bleeding risk, we suggest indefinite treatment rather than treatment for three to six months. For patients with a high bleeding risk, the benefits of indefinite anticoagulation are likely outweighed by the high risk of bleeding such that indefinite anticoagulation is not generally performed.  The rationale for indefinite anticoagulation in patients with an unprovoked proximal DVT or symptomatic PE is based upon the high estimated lifetime risk of recurrent VTE, which can be dramatically reduced by extending anticoagulation beyond the conventional three to six months. Importantly, most studies report a risk reduction in the rate of VTE recurrence at the expense of an increased rate of bleeding and without mortality benefit.  The estimated risk of recurrence following cessation of anticoagulation in patients with a first unprovoked episode of VTE is 10 percent at one year and 30 percent at five years (approximately 5 percent per year after the first year). Prior duration of therapy does not affect the risk of recurrence once anticoagulant therapy is discontinued. Full anticoagulation is associated with an over 90 percent reduction in the rate of recurrence compared with low-intensity anticoagulant regimens and aspirin which are less effective (approximately 60 and 30 percent rate reduction in recurrence respectively). This decrease in recurrence outweighs the rate of bleeding with full anticoagulation in most patients who are  estimated to have a low (0.8 percent per year) or intermediate (1.6 percent per year) bleeding risk.  I have recommended increased H2O intake.   I have recommended OTC compression socks, knee high. I have recommended frequent ambulation and breaks during long travel.  Labs in 6 months: CBC diff, BMET, iron/TIBC, ferritin, Protein C total and Protein S free+ total.  He is given patient information from [email or web address] and encouraged to visit this website for more information, if he wishes.  Problem list reviewed with patient and edited accordingly.  Medications are reviewed with the patient and edited accordingly.  Return in 6 months for follow-up.  I think seeing him every 6 months for 1-2 years to confirm no recurrence of DVT would be reasonable.  Following this, we can see him annually or release him back to his primary care provider.  More than 50% of the time spent with the patient was utilized for counseling and coordination of care.

## 2017-01-16 NOTE — Patient Instructions (Addendum)
[facility] Discharge Instructions  RECOMMENDATIONS MADE BY THE CONSULTANT AND ANY TEST RESULTS WILL BE SENT TO YOUR REFERRING PHYSICIAN.  You were seen today by Kirby Crigler PA-C Continue taking Eliquis daily Buy some knee high compression stockings Follow up in 6 months with lab work   Thank you for choosing [place] to provide your oncology and hematology care.  To afford each patient quality time with our provider, please arrive at least 15 minutes before your scheduled appointment time.    If you have a lab appointment with the [facility] please come in thru the  Main Entrance and check in at the main information desk  You need to re-schedule your appointment should you arrive 10 or more minutes late.  We strive to give you quality time with our providers, and arriving late affects you and other patients whose appointments are after yours.  Also, if you no show three or more times for appointments you may be dismissed from the clinic at the providers discretion.     Again, thank you for choosing [facility].  Our hope is that these requests will decrease the amount of time that you wait before being seen by our physicians.       _____________________________________________________________  Should you have questions after your visit to [facility], please contact our office at [phone] between the hours of 8:30 a.m. and 4:30 p.m.  Voicemails left after 4:30 p.m. will not be returned until the following business day.  For prescription refill requests, have your pharmacy contact our office.       Resources For Cancer Patients and their Caregivers ? American Cancer Society: Can assist with transportation, wigs, general needs, runs Look Good Feel Better.        (681)156-7716 ? Cancer Care: Provides financial assistance, online support groups, medication/co-pay assistance.   1-800-813-HOPE (670)705-2316) ? [place] Assists [place] cancer patients and their families through emotional , educational and financial support.  (214)254-3231 ? [place] DSS Where to apply for food stamps, Medicaid and utility assistance. (409) 370-5442 ? [facility]: Transportation to medical appointments. 671-591-5109 ? Social Security Administration: May apply for disability if have a Stage IV cancer. 712-807-8755 770-130-5068 ? [employer], Disability and Transit Services: Assists with nutrition, care and transit needs. [place] Support Programs: @10RELATIVEDAYS @ > Cancer Support Group  2nd Tuesday of the month 1pm-2pm, Journey Room  > Creative Journey  3rd Tuesday of the month 1130am-1pm, Journey Room  > Look Good Feel Better  1st Wednesday of the month 10am-12 noon, Journey Room (Call [place] to register [phone])

## 2017-01-16 NOTE — Assessment & Plan Note (Signed)
Initially a left leg DVT thought to be provoked by a long trip to [place].  Treated with anticoagulation x 9 months with resolution of DVT.  He was on prophylaxis ASA, but developed a right DVT 6 months later after being off anticoagulation.  Cause of this DVT is thought to be unprovoked.  Restarted on anticoagulation with Eliquis.  Lifelong anticoagulation is recommended.

## 2017-01-16 NOTE — Progress Notes (Signed)
Omar Bogus, MD [place] [address]  Heterozygous factor V Leiden mutation [facility]) - Plan: Protein S, total and free, Protein C, total  Acute deep vein thrombosis (DVT) of femoral vein of both lower extremities (HCC)  Chronic anticoagulation - Plan: CBC with Differential, Iron and TIBC, Ferritin, Basic metabolic panel  CURRENT THERAPY: Anticoagulation recommendations  INTERVAL HISTORY: Omar [name] 71 y.o. 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  CT imaging of chest, abdomen, and pelvis are negative for occult malignancy.  VTE history:  According to conversation with the patient, his wife, and review of his chart the following events occurred: In September 2016, patient developed a left lower extremity cellulitis.  He underwent bilateral lower extremity ultrasounds to evaluate for DVT and was identified to have a left femoral occlusive deep vein thrombosis extending to popliteal region.  He was started on Eliquis.  On September 16, 2015, repeat Doppler study was performed and persistent left lower extremity DVT was identified.  He continued on Eliquis.  Repeat ultrasound of left lower extremity on 12/10/2015 again identifies a persistent occlusive thrombus in the left femoral vein.  He continued on Eliquis.  He is referred to vascular surgery, Dr. Oneida Alar.  He was seen by the vascular surgery and according to documentation from Dr. Nona Dell dictation, "patient had a left leg venous duplex exam today.  This shows resolution of most of the thrombus within his common femoral superficial femoral femoral popliteal veins.  There is still some residual thrombus in the calf veins as well.  Superficial venous system was widely patent without reflux in the left leg.  He has reflux in the deep femoral  system."  At that appointment on 02/03/2016, anticoagulation with Eliquis was discontinued.  As result, the patient discontinued Eliquis and he started aspirin therapy for prophylaxis.  Patient was then noted to develop right leg discomfort which led to a right Doppler study on 08/21/2016 that demonstrated a right lower extremity DVT in the right common femoral vein extending down into the calf.  He was restarted on Eliquis.  Repeat imaging on 12/09/2016 of bilateral lower extremity ultrasounds demonstrates a persistent right DVT in the lower extremity and the right femoral and popliteal veins.  Of note, bilateral ultrasound on 12/09/2016 demonstrates resolution of left lower extremity DVT.  He is here to review hypercoagulable workup and learn hematology recommendations regarding anticoagulation.  He denies any hematology complaints.  He is tolerating Eliquis well without any significant difficulties.  He denies any blood in his stools, black stools, and gross hematuria.  He denies any blood loss.  His appetite is excellent.  Weight is stable.  Review of Systems  Constitutional: Positive for malaise/fatigue. Negative for chills, fever and weight loss.  HENT: Negative.   Eyes: Negative.   Respiratory: Negative.  Negative for cough.   Cardiovascular: Positive for leg swelling. Negative for chest pain.  Gastrointestinal: Negative.  Negative for blood in stool, constipation, diarrhea, melena, nausea and vomiting.  Genitourinary: Negative.   Musculoskeletal: Negative.   Skin: Positive for itching and rash.  Neurological: Negative.  Negative for weakness.  Endo/Heme/Allergies: Bruises/bleeds easily.  Psychiatric/Behavioral: Negative.     Past Medical History:  Diagnosis Date  . Abnormal EKG    Inferior Q waves  . BPH (benign prostatic hyperplasia)   . Chronic anticoagulation 01/16/2017  .  Coronary artery disease   . Degenerative joint disease   . Diabetes mellitus, type 2 (HCC)    No insulin    . DVT (deep venous thrombosis) ([place])   . DVT of leg (deep venous thrombosis) ([place]) 07/02/2015  . Heart murmur   . Hepatic steatosis   . Heterozygous factor V Leiden mutation ([place]) 01/16/2017  . Hyperlipidemia   . 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 Meningioma (HCC)    Left frontal; CVA identified by MRI; no neurologic symptoms  . Neuropathy   . Obesity   . Sleep apnea 2008   Dr. Merlene Laughter interpreted the study-CPAP recommended, but refused by patient  . Stroke [facility])    no deficits from stroke, "Didnt know I had one when they say I did".    Past Surgical History:  Procedure Laterality Date  . CATARACT EXTRACTION W/PHACO Left 04/03/2016   Procedure: CATARACT EXTRACTION PHACO AND INTRAOCULAR LENS PLACEMENT LEFT EYE CDE=21.76;  Surgeon: Williams Che, MD;  Location: [facility];  Service: Ophthalmology;  Laterality: Left;  . CATARACT EXTRACTION W/PHACO Right 06/19/2016   Procedure: CATARACT EXTRACTION PHACO AND INTRAOCULAR LENS PLACEMENT; CDE:  11.56;  Surgeon: Williams Che, MD;  Location: [facility];  Service: Ophthalmology;  Laterality: Right;  . COLONOSCOPY W/ POLYPECTOMY  05/2008   polypectomy x4-adenomatous; internal hemorrhoids  . CORONARY ANGIOPLASTY WITH STENT PLACEMENT  06/04/2012     DES    to [place]    . DUPUYTREN / PALMAR FASCIOTOMY  2009   rt hand-dsc-went home same day x2  . LEFT HEART CATHETERIZATION WITH CORONARY ANGIOGRAM N/A 05/28/2012   Procedure: LEFT HEART CATHETERIZATION WITH CORONARY ANGIOGRAM;  Surgeon: Laverda Page, MD;  Location: [facility] CATH LAB;  Service: Cardiovascular;  Laterality: N/A;  . PERCUTANEOUS CORONARY STENT INTERVENTION (PCI-S) N/A 06/04/2012   Procedure: PERCUTANEOUS CORONARY STENT INTERVENTION (PCI-S);  Surgeon: Laverda Page, MD;  Location: [facility] CATH LAB;  Service: Cardiovascular;  Laterality: N/A;  . TONSILLECTOMY    . VENTRAL HERNIA REPAIR  2010    umb hernia-[facility]-went home same day    Family History  Problem Relation Age of Onset  . Cancer Mother   . Cancer Father   . Coronary artery disease Neg Hx     Social History   Social History  . Marital status: Married    Spouse name: N/A  . Number of children: N/A  . Years of education: N/A   Occupational History  . Manufacturing engineer   Social History Main Topics  . Smoking status: Never Smoker  . Smokeless tobacco: Never Used  . Alcohol use No  . Drug use: No  . Sexual activity: Not Currently    Birth control/ protection: None   Other Topics Concern  . None   Social History Narrative  . None     PHYSICAL EXAMINATION  ECOG PERFORMANCE STATUS: 1 - Symptomatic but completely ambulatory  Vitals:   01/16/17 1300  BP: (!) 138/59  Pulse: (!) 56  Resp: 18   GENERAL:alert, no distress, well nourished, well developed, comfortable, cooperative, obese, smiling and accompanied by wife. SKIN: skin color, texture, turgor are normal, no rashes or significant lesions HEAD: Normocephalic, No masses, lesions, tenderness or abnormalities EYES: normal, EOMI EARS: External ears normal OROPHARYNX:lips, buccal mucosa, and tongue normal and mucous membranes are moist  NECK: supple, no adenopathy, trachea  midline LYMPH:  no palpable lymphadenopathy BREAST:not examined LUNGS: not examined HEART: not examined ABDOMEN:abdomen soft and obese BACK: Back symmetric, no curvature. EXTREMITIES:less then 2 second capillary refill, no joint deformities, effusion, or inflammation, no skin discoloration, no cyanosis, positive findings:  edema B/L LE edema  NEURO: alert & oriented x 3 with fluent speech, no focal motor/sensory deficits, gait normal   LABORATORY DATA: CBC    Component Value Date/Time   WBC 8.9 12/26/2016 1656   RBC 4.94 12/26/2016 1656   HGB 15.2 12/26/2016 1656   HCT 44.8 12/26/2016 1656   PLT 361 12/26/2016 1656   MCV 90.7 12/26/2016 1656   MCH  30.8 12/26/2016 1656   MCHC 33.9 12/26/2016 1656   RDW 14.9 12/26/2016 1656   LYMPHSABS 2.9 12/26/2016 1656   MONOABS 0.9 12/26/2016 1656   EOSABS 0.1 12/26/2016 1656   BASOSABS 0.0 12/26/2016 1656      Chemistry      Component Value Date/Time   NA 136 12/26/2016 1656   K 4.4 12/26/2016 1656   CL 99 (L) 12/26/2016 1656   CO2 26 12/26/2016 1656   BUN 24 (H) 12/26/2016 1656   CREATININE 1.12 12/26/2016 1656   CREATININE 0.86 10/24/2016 1015      Component Value Date/Time   CALCIUM 9.7 12/26/2016 1656   ALKPHOS 61 12/26/2016 1656   AST 23 12/26/2016 1656   ALT 22 12/26/2016 1656   BILITOT 0.6 12/26/2016 1656        PENDING LABS:   RADIOGRAPHIC STUDIES:  Ct Chest W Contrast  Result Date: 01/09/2017 CLINICAL DATA:  Recurrent lower extremity DVT. Evaluate for occult malignancy. EXAM: CT CHEST, ABDOMEN, AND PELVIS WITH CONTRAST TECHNIQUE: Multidetector CT imaging of the chest, abdomen and pelvis was performed following the standard protocol during bolus administration of intravenous contrast. CONTRAST:  145m ISOVUE-300 IOPAMIDOL (ISOVUE-300) INJECTION 61% COMPARISON:  None. FINDINGS: CT CHEST FINDINGS Cardiovascular: No acute findings. 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 Aortic and coronary artery atherosclerosis. Mediastinum/Lymph Nodes: No masses or pathologically enlarged lymph nodes identified. Lungs/Pleura: No pulmonary infiltrate or mass identified. No effusion present. Musculoskeletal:  No suspicious bone lesions identified. CT ABDOMEN AND PELVIS FINDINGS Hepatobiliary: No masses identified. Gallbladder is unremarkable. Pancreas:  No mass or inflammatory changes. Spleen:  Within normal limits in size and appearance. Adrenals/Urinary tract: 2.5 cm low-attenuation left adrenal mass shows contrast washout consistent with a benign adenoma. Normal appearance of right adrenal gland. 5 mm nonobstructing right renal calculus noted. No evidence of ureteral calculi or  hydronephrosis. No evidence of renal mass. Unremarkable unopacified urinary bladder. Stomach/Bowel: No evidence of obstruction, inflammatory process, or abnormal fluid collections. Vascular/Lymphatic: No pathologically enlarged lymph nodes identified. No abdominal aortic aneurysm. Aortic atherosclerosis. Reproductive:  Mildly enlarged prostate. Other:  None. Musculoskeletal: No suspicious bone lesions identified. Moderate right hip osteoarthritis. IMPRESSION: No evidence of malignancy within the chest, abdomen, or pelvis. Aortic and coronary artery atherosclerosis. Benign left adrenal adenoma. Nonobstructing right renal calculus. Mildly enlarged prostate. Electronically Signed   By: JEarle GellM.D.   On: 01/09/2017 16:47   Ct Abdomen Pelvis W Contrast  Result Date: 01/09/2017 CLINICAL DATA:  Recurrent lower extremity DVT. Evaluate for occult malignancy. EXAM: CT CHEST, ABDOMEN, AND PELVIS WITH CONTRAST TECHNIQUE: Multidetector CT imaging of the chest, abdomen and pelvis was performed following the standard protocol during bolus administration of intravenous contrast. CONTRAST:  1013mISOVUE-300 IOPAMIDOL (ISOVUE-300) INJECTION 61% COMPARISON:  None. FINDINGS: CT CHEST FINDINGS Cardiovascular: No acute findings. Aberrant  origin of right subclavian artery again noted. Aortic and coronary artery atherosclerosis. Mediastinum/Lymph Nodes: No masses or pathologically enlarged lymph nodes identified. Lungs/Pleura: No pulmonary infiltrate or mass identified. No effusion present. Musculoskeletal:  No suspicious bone lesions identified. CT ABDOMEN AND PELVIS FINDINGS Hepatobiliary: No masses identified. Gallbladder is unremarkable. Pancreas:  No mass or inflammatory changes. Spleen:  Within normal limits in size and appearance. Adrenals/Urinary tract: 2.5 cm low-attenuation left adrenal mass shows contrast washout consistent with a benign adenoma. Normal appearance of right adrenal gland. 5 mm nonobstructing right renal  calculus noted. No evidence of ureteral calculi or hydronephrosis. No evidence of renal mass. Unremarkable unopacified urinary bladder. Stomach/Bowel: No evidence of obstruction, inflammatory process, or abnormal fluid collections. Vascular/Lymphatic: No pathologically enlarged lymph nodes identified. No abdominal aortic aneurysm. Aortic atherosclerosis. Reproductive:  Mildly enlarged prostate. Other:  None. Musculoskeletal: No suspicious bone lesions identified. Moderate right hip osteoarthritis. IMPRESSION: No evidence of malignancy within the chest, abdomen, or pelvis. Aortic and coronary artery atherosclerosis. Benign left adrenal adenoma. Nonobstructing right renal calculus. Mildly enlarged prostate. Electronically Signed   By: Earle Gell M.D.   On: 01/09/2017 16:47     PATHOLOGY:    ASSESSMENT AND PLAN:  Heterozygous factor V Leiden mutation ([place]) Recurrent DVT WITHOUT PE with hypercoagulable work-up demonstrating heterozygous Factor V Leiden (single R506Q mutation identified) and IgM anticardiolipin low-medium positive.  Both are low thrombophilia risks, but patient has other risk factors including obesity, history of testosterone replacement therapy, and immobility.   CT imaging of chest, abdomen, and pelvis are negative for occult malignancy.  Due to recurrence of DVT, recommend lifelong anticoagulation.  His DVT history is as follows:  According to conversation with the patient, his wife, and review of his chart the following events occurred: In September 2016, patient developed a left lower extremity cellulitis after a long flight to [place].  He underwent bilateral lower extremity ultrasounds to evaluate for DVT and was identified to have a left femoral occlusive deep vein thrombosis extending to popliteal region.  He was started on Eliquis.  On September 16, 2015, repeat Doppler study was performed and persistent left lower extremity DVT was identified.  He continued on Eliquis.  Repeat  ultrasound of left lower extremity on 12/10/2015 again identifies a persistent occlusive thrombus in the left femoral vein.  He continued on Eliquis.  He is referred to vascular surgery, Dr. Oneida Alar.  He was seen by the vascular surgery and according to documentation from Dr. Nona Dell dictation, "patient had a left leg venous duplex exam today.  This shows resolution of most of the thrombus within his common femoral superficial femoral femoral popliteal veins.  There is still some residual thrombus in the calf veins as well.  Superficial venous system was widely patent without reflux in the left leg.  He has reflux in the deep femoral system."  At that appointment on 02/03/2016, anticoagulation with Eliquis was discontinued.  As result, the patient discontinued Eliquis and he started aspirin therapy for prophylaxis.  Patient was then noted to develop right leg discomfort which led to a right Doppler study on 08/21/2016 that demonstrated a right lower extremity DVT in the right common femoral vein extending down into the calf.  He was restarted on Eliquis.  Repeat imaging on 12/09/2016 of bilateral lower extremity ultrasounds demonstrates a persistent right DVT in the lower extremity and the right femoral and popliteal veins.  Of note, bilateral ultrasound on 12/09/2016 demonstrates resolution of left lower extremity DVT.  I personally  reviewed and went over laboratory results with the patient.  The results are noted within this dictation.  I reviewed his labs from 12/26/2016: CBC diff, CMET, iron/TIBC, ferritin, CRP, ESR, LDH, Factor V leiden, prothrombin gene mutation, anticardiolipin antibodies IgG, IgM, IgA, and Beta-2 glycoprotein antibodies IgG, IgM, IgA.  Protein C total and Protein S free+ total were not drawn, despite being ordered at time of consultation.  Will draw with his next lab appointment for completeness sake, but will not alter anticoagulation recommendations at this time.  I have reviewed Factor V  leiden heterozygosity and its low thrombophilia risk.   Patients with Factor V Leiden heterozygosity are managed similarly as the general population.  There is no high-quality data that aspirin or an anticoagulant reduces the risk of VTE in this patient population.  I reviewed his low-medium IgM anticardiolipin (aCL) positivity.  Clinically significant antiphospholipid antibody (aPL) profile is defined as the presence of one or more of the following aPLs on two or more occassions at least 12 weeks apart:  1. A positive LA test, based on the guidelines of international Society of Thrombosis and Haemostasis  2. aCL IgG or IgM, with a titer > 40 units  3. anti-beta-glycoprotein I IgG or IgM, with a titer > 40 units.    Given that his IgM aCL was 22, this is clinically insignificant.    Despite this information, given his recurrent DVT with the second DVT being unprovoked, we recommend lifelong anticoaguation.  He is tolerating well.  Unprovoked proximal DVT and symptomatic PE-The decision regarding the use of indefinite anticoagulation in patients with a first episode of unprovoked proximal deep vein thrombosis (DVT), unprovoked symptomatic pulmonary embolism (PE), or patients with active cancer is dependent upon patient-specific bleeding and thrombotic risks as well as the patient's values and preferences. For those with a low to moderate bleeding risk, we suggest indefinite treatment rather than treatment for three to six months. For patients with a high bleeding risk, the benefits of indefinite anticoagulation are likely outweighed by the high risk of bleeding such that indefinite anticoagulation is not generally performed.  The rationale for indefinite anticoagulation in patients with an unprovoked proximal DVT or symptomatic PE is based upon the high estimated lifetime risk of recurrent VTE, which can be dramatically reduced by extending anticoagulation beyond the conventional three to six months.  Importantly, most studies report a risk reduction in the rate of VTE recurrence at the expense of an increased rate of bleeding and without mortality benefit.  The estimated risk of recurrence following cessation of anticoagulation in patients with a first unprovoked episode of VTE is 10 percent at one year and 30 percent at five years (approximately 5 percent per year after the first year). Prior duration of therapy does not affect the risk of recurrence once anticoagulant therapy is discontinued. Full anticoagulation is associated with an over 90 percent reduction in the rate of recurrence compared with low-intensity anticoagulant regimens and aspirin which are less effective (approximately 60 and 30 percent rate reduction in recurrence respectively). This decrease in recurrence outweighs the rate of bleeding with full anticoagulation in most patients who are estimated to have a low (0.8 percent per year) or intermediate (1.6 percent per year) bleeding risk.  I have recommended increased H2O intake.   I have recommended OTC compression socks, knee high. I have recommended frequent ambulation and breaks during long travel.  Labs in 6 months: CBC diff, BMET, iron/TIBC, ferritin, Protein C total and Protein S free+  total.  He is given patient information from [employer] and encouraged to visit this website for more information, if he wishes.  Problem list reviewed with patient and edited accordingly.  Medications are reviewed with the patient and edited accordingly.  Return in 6 months for follow-up.  I think seeing him every 6 months for 1-2 years to confirm no recurrence of DVT would be reasonable.  Following this, we can see him annually or release him back to his primary care provider.  More than 50% of the time spent with the patient was utilized for counseling and coordination of care.  DVT of leg (deep venous thrombosis) ([place]) Initially a left leg DVT thought to be provoked by a long  trip to [place].  Treated with anticoagulation x 9 months with resolution of DVT.  He was on prophylaxis ASA, but developed a right DVT 6 months later after being off anticoagulation.  Cause of this DVT is thought to be unprovoked.  Restarted on anticoagulation with Eliquis.  Lifelong anticoagulation is recommended.  Chronic anticoagulation Lifelong anticoagulation is recommended for an unprovoked right lower extremity DVT.  On Eliquis.   ORDERS PLACED FOR THIS ENCOUNTER: Orders Placed This Encounter  Procedures  . Protein S, total and free  . Protein C, total  . CBC with Differential  . Iron and TIBC  . Ferritin  . Basic metabolic panel    MEDICATIONS PRESCRIBED THIS ENCOUNTER: Meds ordered this encounter  Medications  . verapamil (VERELAN PM) 240 MG 24 hr capsule    THERAPY PLAN:  Recommend lifelong anticoagulation given his right LE DVT is thought to be unprovoked.  All questions were answered. The patient knows to call the clinic with any problems, questions or concerns. We can certainly see the patient much sooner if necessary.  Patient and plan discussed with Dr. Twana First and she is in agreement with the aforementioned.   This note is electronically signed by: Doy Mince 01/16/2017 3:06 PM

## 2017-01-16 NOTE — Assessment & Plan Note (Signed)
Lifelong anticoagulation is recommended for an unprovoked right lower extremity DVT.  On Eliquis.

## 2017-01-17 ENCOUNTER — Ambulatory Visit (HOSPITAL_COMMUNITY): Payer: PPO

## 2017-01-22 ENCOUNTER — Other Ambulatory Visit: Payer: Self-pay | Admitting: "Endocrinology

## 2017-01-22 DIAGNOSIS — E1159 Type 2 diabetes mellitus with other circulatory complications: Secondary | ICD-10-CM | POA: Diagnosis not present

## 2017-01-22 DIAGNOSIS — E6609 Other obesity due to excess calories: Secondary | ICD-10-CM | POA: Diagnosis not present

## 2017-01-22 DIAGNOSIS — Z6841 Body Mass Index (BMI) 40.0 and over, adult: Secondary | ICD-10-CM | POA: Diagnosis not present

## 2017-01-22 LAB — COMPREHENSIVE METABOLIC PANEL
ALBUMIN: 4 g/dL (ref 3.6–5.1)
ALT: 11 U/L (ref 9–46)
AST: 14 U/L (ref 10–35)
Alkaline Phosphatase: 54 U/L (ref 40–115)
BUN: 17 mg/dL (ref 7–25)
CHLORIDE: 103 mmol/L (ref 98–110)
CO2: 27 mmol/L (ref 20–31)
Calcium: 9.8 mg/dL (ref 8.6–10.3)
Creat: 0.95 mg/dL (ref 0.70–1.18)
Glucose, Bld: 146 mg/dL — ABNORMAL HIGH (ref 65–99)
Potassium: 4.2 mmol/L (ref 3.5–5.3)
SODIUM: 140 mmol/L (ref 135–146)
TOTAL PROTEIN: 6.5 g/dL (ref 6.1–8.1)
Total Bilirubin: 0.6 mg/dL (ref 0.2–1.2)

## 2017-01-22 LAB — TSH: TSH: 3.14 m[IU]/L (ref 0.40–4.50)

## 2017-01-22 LAB — T4, FREE: Free T4: 0.9 ng/dL (ref 0.8–1.8)

## 2017-01-23 LAB — HEMOGLOBIN A1C
Hgb A1c MFr Bld: 7.4 % — ABNORMAL HIGH (ref <5.7)
Mean Plasma Glucose: 166 mg/dL

## 2017-01-26 ENCOUNTER — Ambulatory Visit: Payer: PPO | Admitting: "Endocrinology

## 2017-01-30 ENCOUNTER — Ambulatory Visit (INDEPENDENT_AMBULATORY_CARE_PROVIDER_SITE_OTHER): Payer: PPO | Admitting: "Endocrinology

## 2017-01-30 ENCOUNTER — Encounter: Payer: Self-pay | Admitting: "Endocrinology

## 2017-01-30 VITALS — BP 141/80 | HR 72 | Ht 68.0 in | Wt 270.0 lb

## 2017-01-30 DIAGNOSIS — I10 Essential (primary) hypertension: Secondary | ICD-10-CM

## 2017-01-30 DIAGNOSIS — E782 Mixed hyperlipidemia: Secondary | ICD-10-CM | POA: Diagnosis not present

## 2017-01-30 DIAGNOSIS — E1159 Type 2 diabetes mellitus with other circulatory complications: Secondary | ICD-10-CM

## 2017-01-30 DIAGNOSIS — Z6841 Body Mass Index (BMI) 40.0 and over, adult: Secondary | ICD-10-CM

## 2017-01-30 DIAGNOSIS — E6609 Other obesity due to excess calories: Secondary | ICD-10-CM

## 2017-01-30 DIAGNOSIS — IMO0001 Reserved for inherently not codable concepts without codable children: Secondary | ICD-10-CM

## 2017-01-30 LAB — PROTHROMBIN GENE MUTATION

## 2017-01-30 NOTE — Progress Notes (Signed)
Subjective:    Patient ID: Omar [name], male    DOB: 1946-02-10,    Past Medical History:  Diagnosis Date  . Abnormal EKG    Inferior Q waves  . BPH (benign prostatic hyperplasia)   . Chronic anticoagulation 01/16/2017  . Coronary artery disease   . Degenerative joint disease   . Diabetes mellitus, type 2 (HCC)    No insulin  . DVT (deep venous thrombosis) ([place])   . DVT of leg (deep venous thrombosis) ([place]) 07/02/2015  . Heart murmur   . Hepatic steatosis   . Heterozygous factor V Leiden mutation ([place]) 01/16/2017  . Hyperlipidemia   . 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 Meningioma (HCC)    Left frontal; CVA identified by MRI; no neurologic symptoms  . Neuropathy   . Obesity   . Sleep apnea 2008   Dr. Merlene Laughter interpreted the study-CPAP recommended, but refused by patient  . Stroke [facility])    no deficits from stroke, "Didnt know I had one when they say I did".   Past Surgical History:  Procedure Laterality Date  . CATARACT EXTRACTION W/PHACO Left 04/03/2016   Procedure: CATARACT EXTRACTION PHACO AND INTRAOCULAR LENS PLACEMENT LEFT EYE CDE=21.76;  Surgeon: Williams Che, MD;  Location: [facility];  Service: Ophthalmology;  Laterality: Left;  . CATARACT EXTRACTION W/PHACO Right 06/19/2016   Procedure: CATARACT EXTRACTION PHACO AND INTRAOCULAR LENS PLACEMENT; CDE:  11.56;  Surgeon: Williams Che, MD;  Location: [facility];  Service: Ophthalmology;  Laterality: Right;  . COLONOSCOPY W/ POLYPECTOMY  05/2008   polypectomy x4-adenomatous; internal hemorrhoids  . CORONARY ANGIOPLASTY WITH STENT PLACEMENT  06/04/2012     DES    to [place]    . DUPUYTREN / PALMAR FASCIOTOMY  2009   rt hand-dsc-went home same day x2  . LEFT HEART CATHETERIZATION WITH CORONARY ANGIOGRAM N/A 05/28/2012   Procedure: LEFT HEART CATHETERIZATION WITH CORONARY ANGIOGRAM;  Surgeon: Laverda Page, MD;   Location: [facility] CATH LAB;  Service: Cardiovascular;  Laterality: N/A;  . PERCUTANEOUS CORONARY STENT INTERVENTION (PCI-S) N/A 06/04/2012   Procedure: PERCUTANEOUS CORONARY STENT INTERVENTION (PCI-S);  Surgeon: Laverda Page, MD;  Location: [facility] CATH LAB;  Service: Cardiovascular;  Laterality: N/A;  . TONSILLECTOMY    . VENTRAL HERNIA REPAIR  2010   umb hernia-[facility]-went home same day   Social History   Social History  . Marital status: Married    Spouse name: N/A  . Number of children: N/A  . Years of education: N/A   Occupational History  . Manufacturing engineer   Social History Main Topics  . Smoking status: Never Smoker  . Smokeless tobacco: Never Used  . Alcohol use No  . Drug use: No  . Sexual activity: Not Currently    Birth control/ protection: None   Other Topics Concern  . None   Social History Narrative  . None   Outpatient Encounter Prescriptions as of 01/30/2017  Medication Sig  . apixaban (ELIQUIS) 5 MG TABS tablet Take 1 tablet (5 mg total) by mouth 2 (two) times daily. For the first 7 days, take 2 tablets twice a day. Then begin 1 tablet twice a day.  Marland Kitchen eplerenone (INSPRA) 25 MG tablet Take 25 mg by mouth daily.  Marland Kitchen gabapentin (NEURONTIN) 100 MG capsule TAKE (1) CAPSULE BY MOUTH TWICE DAILY.  Marland Kitchen  INVOKANA 100 MG TABS tablet TAKE ONE TABLET BY MOUTH ONCE DAILY.  Marland Kitchen lisinopril-hydrochlorothiazide (PRINZIDE,ZESTORETIC) 20-25 MG per tablet Take 1 tablet by mouth daily.  . metFORMIN (GLUCOPHAGE) 1000 MG tablet TAKE (1) TABLET BY MOUTH TWICE DAILY.  . Multiple Vitamins-Minerals (ONE-A-DAY 50 PLUS PO) Take 1 tablet by mouth daily.  . nebivolol (BYSTOLIC) 10 MG tablet Take 20 mg by mouth daily.  . nitroGLYCERIN (NITROSTAT) 0.4 MG SL tablet Place 0.4 mg under the tongue every 5 (five) minutes as needed for chest pain.  . pravastatin (PRAVACHOL) 40 MG tablet Take 40 mg by mouth daily.  . Tamsulosin HCl (FLOMAX) 0.4 MG CAPS Take 0.4 mg by mouth daily after  breakfast.  . torsemide (DEMADEX) 20 MG tablet   . traMADol (ULTRAM) 50 MG tablet Take 1 tablet (50 mg total) by mouth every 6 (six) hours as needed.  . traZODone (DESYREL) 100 MG tablet Take 100 mg by mouth at bedtime.  . verapamil (CALAN-SR) 240 MG CR tablet Take 1 tablet (240 mg total) by mouth at bedtime.  . verapamil (VERELAN PM) 240 MG 24 hr capsule   . Vitamin D, Ergocalciferol, (DRISDOL) 50000 units CAPS capsule TAKE 1 CAPSULE BY MOUTH ONCE A WEEK.  . [DISCONTINUED] metFORMIN (GLUCOPHAGE) 500 MG tablet Take 2 tablets (1,000 mg total) by mouth 2 (two) times daily with a meal.   No facility-administered encounter medications on file as of 01/30/2017.    ALLERGIES: No Known Allergies VACCINATION STATUS:  There is no immunization history on file for this patient.  Diabetes  He presents for his follow-up diabetic visit. He has type 2 diabetes mellitus. The initial diagnosis of diabetes was made 16 years ago. His disease course has been stable. There are no hypoglycemic associated symptoms. Pertinent negatives for hypoglycemia include no confusion, headaches, pallor or seizures. Pertinent negatives for diabetes include no chest pain, no fatigue, no polydipsia, no polyphagia, no polyuria and no weakness. Symptoms are stable. Diabetic complications include heart disease and PVD. Risk factors for coronary artery disease include diabetes mellitus, dyslipidemia, obesity, male sex, hypertension and sedentary lifestyle. Current diabetic treatment includes oral agent (monotherapy). He is compliant with treatment some of the time. His weight is stable (He has a steady weight after he lost close to 65 pounds.). He is following a generally unhealthy diet. He has had a previous visit with a dietitian. He participates in exercise intermittently. His home blood glucose trend is decreasing steadily. His overall blood glucose range is 140-180 mg/dl. An ACE inhibitor/angiotensin II receptor blocker is being taken.  Eye exam is current.  Hypertension  This is a chronic problem. The current episode started more than 1 year ago. The problem is uncontrolled. Pertinent negatives include no chest pain, headaches, neck pain, palpitations or shortness of breath. Past treatments include ACE inhibitors, beta blockers and calcium channel blockers. The current treatment provides no improvement. Hypertensive end-organ damage includes CAD/MI and PVD.  Hyperlipidemia  This is a chronic problem. The current episode started more than 1 year ago. Pertinent negatives include no chest pain, myalgias or shortness of breath. Current antihyperlipidemic treatment includes statins. Risk factors for coronary artery disease include dyslipidemia, diabetes mellitus, hypertension, male sex, obesity and a sedentary lifestyle.   He was recently treated for  DVT of LLE .  Review of Systems  Constitutional: Negative for fatigue and unexpected weight change.  HENT: Negative for dental problem, mouth sores and trouble swallowing.   Eyes: Negative for visual disturbance.  Respiratory: Negative for cough,  choking, chest tightness, shortness of breath and wheezing.   Cardiovascular: Negative for chest pain, palpitations and leg swelling.  Gastrointestinal: Negative for abdominal distention, abdominal pain, constipation, diarrhea, nausea and vomiting.  Endocrine: Negative for polydipsia, polyphagia and polyuria.  Genitourinary: Negative for dysuria, flank pain, hematuria and urgency.  Musculoskeletal: Negative for back pain, gait problem, joint swelling, myalgias and neck pain.  Skin: Negative for pallor, rash and wound.  Neurological: Negative for seizures, syncope, weakness, numbness and headaches.  Psychiatric/Behavioral: Negative for confusion, dysphoric mood, hallucinations and suicidal ideas.    Objective:    BP (!) 141/80   Pulse 72   Ht 5\' 8"  (1.727 m)   Wt 270 lb (122.5 kg)   BMI 41.05 kg/m   Wt Readings from Last 3  Encounters:  01/30/17 270 lb (122.5 kg)  01/16/17 270 lb (122.5 kg)  12/26/16 265 lb (120.2 kg)    Physical Exam  Constitutional: He is oriented to person, place, and time. He appears well-developed and well-nourished. He is cooperative.  HENT:  Head: Normocephalic and atraumatic.  Eyes: EOM are normal.  Neck: Normal range of motion. Neck supple. No tracheal deviation present. No thyromegaly present.  Cardiovascular: Normal rate and normal heart sounds.  Exam reveals no gallop.   No murmur heard. Pulses:      Dorsalis pedis pulses are 0 on the right side, and 0 on the left side.       Posterior tibial pulses are 0 on the right side, and 0 on the left side.  Pulmonary/Chest: Breath sounds normal. No respiratory distress. He has no wheezes.  Abdominal: Soft. Bowel sounds are normal. He exhibits no distension. There is no hepatosplenomegaly. There is no tenderness. There is no guarding and no CVA tenderness.  Musculoskeletal: He exhibits no edema.       Right shoulder: He exhibits no swelling and no deformity.  Neurological: He is alert and oriented to person, place, and time. He has normal strength and normal reflexes. No cranial nerve deficit or sensory deficit. Gait normal.  Skin: Skin is warm and dry. No rash noted. No cyanosis. Nails show no clubbing.  Psychiatric: He has a normal mood and affect. His speech is normal and behavior is normal. Thought content normal. Cognition and memory are normal.    Results for orders placed or performed in visit on 01/22/17  Comprehensive metabolic panel  Result Value Ref Range   Sodium 140 135 - 146 mmol/L   Potassium 4.2 3.5 - 5.3 mmol/L   Chloride 103 98 - 110 mmol/L   CO2 27 20 - 31 mmol/L   Glucose, Bld 146 (H) 65 - 99 mg/dL   BUN 17 7 - 25 mg/dL   Creat 0.95 0.70 - 1.18 mg/dL   Total Bilirubin 0.6 0.2 - 1.2 mg/dL   Alkaline Phosphatase 54 40 - 115 U/L   AST 14 10 - 35 U/L   ALT 11 9 - 46 U/L   Total Protein 6.5 6.1 - 8.1 g/dL    Albumin 4.0 3.6 - 5.1 g/dL   Calcium 9.8 8.6 - 10.3 mg/dL  TSH  Result Value Ref Range   TSH 3.14 0.40 - 4.50 mIU/L  T4, free  Result Value Ref Range   Free T4 0.9 0.8 - 1.8 ng/dL  Hemoglobin A1c  Result Value Ref Range   Hgb A1c MFr Bld 7.4 (H) <5.7 %   Mean Plasma Glucose 166 mg/dL   Diabetic Labs (most recent): Lab Results  Component Value  Date   HGBA1C 7.4 (H) 01/22/2017   HGBA1C 7.5 (H) 10/24/2016   HGBA1C 7.3 (H) 07/25/2016    Assessment & Plan:   1. Type 2 diabetes mellitus with other circulatory complications -He remains at risk for more complications of diabetes. -He came with Slight increase in his A1c is stable at 7.4% <-7.5% <-7.3%<-  6.8% . - He has a steady weight over the last 3 visits. - Patient is advised to stick to a routine mealtimes to eat 3 meals  a day and avoid unnecessary snacks ( to snack only to correct hypoglycemia). Patient is advised to eliminate simple carbs  from their diet including cakes desserts ice cream soda (  diet and regular) , sweet tea , Candies,  chips and cookies, artificial sweeteners,   and "sugar-free" products .  This will help patient to have stable blood glucose profile and potentially lose weight. Patient is given detailed personalized glucose monitoring and insulin dosing instructions. Patient is instructed to call back with extremes of blood glucose less than 70 or greater than 300.  Based on his current progress, he will not need insulin for now.   He is  advised to  Continue   Metformin 1000mg  po BID, and  Invokana 100mg   PO qAM. He is getting  Invokana samples currently from his primary medical doctor, he will call for prescription when he runs out. - The concern in his case is his recent  weight gain which will definitely make control of diabetes more difficult. If he loses more control, he will be considered for incretin therapy or basal insulin .  2. Hyperlipidemia: I advised him to continue Pravastatin 40 mg po qhs.  3.  Obesity Dietitian consult in progress. Encouraged to continue carbs restriction. I encouraged him to use [employer] facilities for water aerobics as well as stationary bikes.   4. Essential hypertension uncontrolled. Continue current meds including ACEI, beta blocker, CCB.  5) vitamin D deficiency: I advised him to continue vitamin D 50,000 units weekly  Follow up plan: Return in about 3 months (around 05/02/2017) for follow up with pre-visit labs. With meter and logs for f/u.  Glade Lloyd, MD Phone: [phone]  Fax: [phone]   01/30/2017, 11:07 AM

## 2017-01-30 NOTE — Patient Instructions (Signed)

## 2017-02-06 ENCOUNTER — Ambulatory Visit (INDEPENDENT_AMBULATORY_CARE_PROVIDER_SITE_OTHER): Payer: PPO | Admitting: Urology

## 2017-02-06 DIAGNOSIS — R3915 Urgency of urination: Secondary | ICD-10-CM

## 2017-02-06 DIAGNOSIS — R350 Frequency of micturition: Secondary | ICD-10-CM | POA: Diagnosis not present

## 2017-02-06 DIAGNOSIS — N3941 Urge incontinence: Secondary | ICD-10-CM

## 2017-03-29 ENCOUNTER — Other Ambulatory Visit: Payer: Self-pay | Admitting: "Endocrinology

## 2017-04-02 ENCOUNTER — Other Ambulatory Visit: Payer: Self-pay | Admitting: "Endocrinology

## 2017-04-05 DIAGNOSIS — I872 Venous insufficiency (chronic) (peripheral): Secondary | ICD-10-CM | POA: Diagnosis not present

## 2017-04-05 DIAGNOSIS — I10 Essential (primary) hypertension: Secondary | ICD-10-CM | POA: Diagnosis not present

## 2017-04-05 DIAGNOSIS — E119 Type 2 diabetes mellitus without complications: Secondary | ICD-10-CM | POA: Diagnosis not present

## 2017-05-01 ENCOUNTER — Other Ambulatory Visit: Payer: Self-pay | Admitting: "Endocrinology

## 2017-05-01 DIAGNOSIS — E1159 Type 2 diabetes mellitus with other circulatory complications: Secondary | ICD-10-CM | POA: Diagnosis not present

## 2017-05-01 LAB — CMP 10231
AG RATIO: 1.7 ratio (ref 1.0–2.5)
ALK PHOS: 61 U/L (ref 40–115)
ALT: 14 U/L (ref 9–46)
AST: 12 U/L (ref 10–35)
Albumin: 3.8 g/dL (ref 3.6–5.1)
BUN / CREAT RATIO: 18.2 ratio (ref 6–22)
BUN: 16 mg/dL (ref 7–25)
CO2: 26 mmol/L (ref 20–31)
Calcium: 9.2 mg/dL (ref 8.6–10.3)
Chloride: 103 mmol/L (ref 98–110)
Creat: 0.88 mg/dL (ref 0.70–1.18)
GFR, EST NON AFRICAN AMERICAN: 86 mL/min (ref >=60)
Globulin: 2.3 g/dL (ref 1.9–3.7)
Glucose, Bld: 142 mg/dL — ABNORMAL HIGH (ref 65–99)
POTASSIUM: 4 mmol/L (ref 3.5–5.3)
SODIUM: 141 mmol/L (ref 135–146)
Total Bilirubin: 0.4 mg/dL (ref 0.2–1.2)
Total Protein: 6.1 g/dL (ref 6.1–8.1)

## 2017-05-02 LAB — HEMOGLOBIN A1C
HEMOGLOBIN A1C: 7.7 % — AB (ref <5.7)
MEAN PLASMA GLUCOSE: 174 mg/dL

## 2017-05-03 ENCOUNTER — Encounter: Payer: Self-pay | Admitting: "Endocrinology

## 2017-05-03 ENCOUNTER — Ambulatory Visit (INDEPENDENT_AMBULATORY_CARE_PROVIDER_SITE_OTHER): Payer: PPO | Admitting: "Endocrinology

## 2017-05-03 VITALS — BP 153/86 | HR 67 | Ht 68.0 in | Wt 273.0 lb

## 2017-05-03 DIAGNOSIS — E1159 Type 2 diabetes mellitus with other circulatory complications: Secondary | ICD-10-CM | POA: Diagnosis not present

## 2017-05-03 DIAGNOSIS — Z6841 Body Mass Index (BMI) 40.0 and over, adult: Secondary | ICD-10-CM | POA: Diagnosis not present

## 2017-05-03 DIAGNOSIS — I10 Essential (primary) hypertension: Secondary | ICD-10-CM

## 2017-05-03 DIAGNOSIS — IMO0001 Reserved for inherently not codable concepts without codable children: Secondary | ICD-10-CM

## 2017-05-03 DIAGNOSIS — E6609 Other obesity due to excess calories: Secondary | ICD-10-CM | POA: Diagnosis not present

## 2017-05-03 DIAGNOSIS — E782 Mixed hyperlipidemia: Secondary | ICD-10-CM

## 2017-05-03 MED ORDER — DULAGLUTIDE 0.75 MG/0.5ML ~~LOC~~ SOAJ: 0.7500 mg | SUBCUTANEOUS | 2 refills | Status: DC

## 2017-05-03 NOTE — Progress Notes (Signed)
Subjective:    Patient ID: Omar [name], male    DOB: 14-Mar-1946,    Past Medical History:  Diagnosis Date  . Abnormal EKG    Inferior Q waves  . BPH (benign prostatic hyperplasia)   . Chronic anticoagulation 01/16/2017  . Coronary artery disease   . Degenerative joint disease   . Diabetes mellitus, type 2 (HCC)    No insulin  . DVT (deep venous thrombosis) ([place])   . DVT of leg (deep venous thrombosis) ([place]) 07/02/2015  . Heart murmur   . Hepatic steatosis   . Heterozygous factor V Leiden mutation ([place]) 01/16/2017  . Hyperlipidemia   . 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 Meningioma (HCC)    Left frontal; CVA identified by MRI; no neurologic symptoms  . Neuropathy   . Obesity   . Sleep apnea 2008   Dr. Merlene Laughter interpreted the study-CPAP recommended, but refused by patient  . Stroke [facility])    no deficits from stroke, "Didnt know I had one when they say I did".   Past Surgical History:  Procedure Laterality Date  . CATARACT EXTRACTION W/PHACO Left 04/03/2016   Procedure: CATARACT EXTRACTION PHACO AND INTRAOCULAR LENS PLACEMENT LEFT EYE CDE=21.76;  Surgeon: Williams Che, MD;  Location: [facility];  Service: Ophthalmology;  Laterality: Left;  . CATARACT EXTRACTION W/PHACO Right 06/19/2016   Procedure: CATARACT EXTRACTION PHACO AND INTRAOCULAR LENS PLACEMENT; CDE:  11.56;  Surgeon: Williams Che, MD;  Location: [facility];  Service: Ophthalmology;  Laterality: Right;  . COLONOSCOPY W/ POLYPECTOMY  05/2008   polypectomy x4-adenomatous; internal hemorrhoids  . CORONARY ANGIOPLASTY WITH STENT PLACEMENT  06/04/2012     DES    to [place]    . DUPUYTREN / PALMAR FASCIOTOMY  2009   rt hand-dsc-went home same day x2  . LEFT HEART CATHETERIZATION WITH CORONARY ANGIOGRAM N/A 05/28/2012   Procedure: LEFT HEART CATHETERIZATION WITH CORONARY ANGIOGRAM;  Surgeon: Laverda Page, MD;   Location: [facility] CATH LAB;  Service: Cardiovascular;  Laterality: N/A;  . PERCUTANEOUS CORONARY STENT INTERVENTION (PCI-S) N/A 06/04/2012   Procedure: PERCUTANEOUS CORONARY STENT INTERVENTION (PCI-S);  Surgeon: Laverda Page, MD;  Location: [facility] CATH LAB;  Service: Cardiovascular;  Laterality: N/A;  . TONSILLECTOMY    . VENTRAL HERNIA REPAIR  2010   umb hernia-[facility]-went home same day   Social History   Social History  . Marital status: Married    Spouse name: N/A  . Number of children: N/A  . Years of education: N/A   Occupational History  . Manufacturing engineer   Social History Main Topics  . Smoking status: Never Smoker  . Smokeless tobacco: Never Used  . Alcohol use No  . Drug use: No  . Sexual activity: Not Currently    Birth control/ protection: None   Other Topics Concern  . None   Social History Narrative  . None   Outpatient Encounter Prescriptions as of 05/03/2017  Medication Sig  . apixaban (ELIQUIS) 5 MG TABS tablet Take 1 tablet (5 mg total) by mouth 2 (two) times daily. For the first 7 days, take 2 tablets twice a day. Then begin 1 tablet twice a day.  . Dulaglutide (TRULICITY) 4.09 WJ/1.9JY SOPN Inject 0.75 mg into the skin once a week.  Marland Kitchen eplerenone (INSPRA) 25 MG tablet Take 25 mg by mouth  daily.  . gabapentin (NEURONTIN) 100 MG capsule TAKE (1) CAPSULE BY MOUTH TWICE DAILY.  Marland Kitchen lisinopril-hydrochlorothiazide (PRINZIDE,ZESTORETIC) 20-25 MG per tablet Take 1 tablet by mouth daily.  . metFORMIN (GLUCOPHAGE) 1000 MG tablet TAKE (1) TABLET BY MOUTH TWICE DAILY.  . Multiple Vitamins-Minerals (ONE-A-DAY 50 PLUS PO) Take 1 tablet by mouth daily.  . nebivolol (BYSTOLIC) 10 MG tablet Take 20 mg by mouth daily.  . nitroGLYCERIN (NITROSTAT) 0.4 MG SL tablet Place 0.4 mg under the tongue every 5 (five) minutes as needed for chest pain.  . pravastatin (PRAVACHOL) 40 MG tablet Take 40 mg by mouth daily.  . Tamsulosin HCl (FLOMAX) 0.4 MG CAPS Take 0.4 mg by  mouth daily after breakfast.  . torsemide (DEMADEX) 20 MG tablet   . traMADol (ULTRAM) 50 MG tablet Take 1 tablet (50 mg total) by mouth every 6 (six) hours as needed.  . traZODone (DESYREL) 100 MG tablet Take 100 mg by mouth at bedtime.  . verapamil (CALAN-SR) 240 MG CR tablet Take 1 tablet (240 mg total) by mouth at bedtime.  . verapamil (VERELAN PM) 240 MG 24 hr capsule   . Vitamin D, Ergocalciferol, (DRISDOL) 50000 units CAPS capsule TAKE 1 CAPSULE BY MOUTH ONCE A WEEK.  . [DISCONTINUED] INVOKANA 100 MG TABS tablet TAKE ONE TABLET BY MOUTH ONCE DAILY.   No facility-administered encounter medications on file as of 05/03/2017.    ALLERGIES: No Known Allergies VACCINATION STATUS:  There is no immunization history on file for this patient.  Diabetes  He presents for his follow-up diabetic visit. He has type 2 diabetes mellitus. The initial diagnosis of diabetes was made 16 years ago. His disease course has been worsening. There are no hypoglycemic associated symptoms. Pertinent negatives for hypoglycemia include no confusion, headaches, pallor or seizures. Pertinent negatives for diabetes include no chest pain, no fatigue, no polydipsia, no polyphagia, no polyuria and no weakness. Symptoms are worsening. Diabetic complications include heart disease and PVD. Risk factors for coronary artery disease include diabetes mellitus, dyslipidemia, obesity, male sex, hypertension and sedentary lifestyle. Current diabetic treatment includes oral agent (monotherapy). He is compliant with treatment some of the time. His weight is stable (He has a steady weight after he lost close to 65 pounds.). He is following a generally unhealthy diet. He has had a previous visit with a dietitian. He participates in exercise intermittently. An ACE inhibitor/angiotensin II receptor blocker is being taken. Eye exam is current.  Hypertension  This is a chronic problem. The current episode started more than 1 year ago. The  problem is uncontrolled. Pertinent negatives include no chest pain, headaches, neck pain, palpitations or shortness of breath. Past treatments include ACE inhibitors, beta blockers and calcium channel blockers. The current treatment provides no improvement. Hypertensive end-organ damage includes CAD/MI and PVD.  Hyperlipidemia  This is a chronic problem. The current episode started more than 1 year ago. Pertinent negatives include no chest pain, myalgias or shortness of breath. Current antihyperlipidemic treatment includes statins. Risk factors for coronary artery disease include dyslipidemia, diabetes mellitus, hypertension, male sex, obesity and a sedentary lifestyle.   He was recently treated for  DVT of LLE .  Review of Systems  Constitutional: Negative for fatigue and unexpected weight change.  HENT: Negative for dental problem, mouth sores and trouble swallowing.   Eyes: Negative for visual disturbance.  Respiratory: Negative for cough, choking, chest tightness, shortness of breath and wheezing.   Cardiovascular: Negative for chest pain, palpitations and leg swelling.  Gastrointestinal:  Negative for abdominal distention, abdominal pain, constipation, diarrhea, nausea and vomiting.  Endocrine: Negative for polydipsia, polyphagia and polyuria.  Genitourinary: Negative for dysuria, flank pain, hematuria and urgency.  Musculoskeletal: Negative for back pain, gait problem, joint swelling, myalgias and neck pain.  Skin: Negative for pallor, rash and wound.  Neurological: Negative for seizures, syncope, weakness, numbness and headaches.  Psychiatric/Behavioral: Negative for confusion, dysphoric mood, hallucinations and suicidal ideas.    Objective:    BP (!) 153/86   Pulse 67   Ht 5\' 8"  (1.727 m)   Wt 273 lb (123.8 kg)   BMI 41.51 kg/m   Wt Readings from Last 3 Encounters:  05/03/17 273 lb (123.8 kg)  01/30/17 270 lb (122.5 kg)  01/16/17 270 lb (122.5 kg)    Physical Exam   Constitutional: He is oriented to person, place, and time. He appears well-developed and well-nourished. He is cooperative.  HENT:  Head: Normocephalic and atraumatic.  Eyes: EOM are normal.  Neck: Normal range of motion. Neck supple. No tracheal deviation present. No thyromegaly present.  Cardiovascular: Normal rate and normal heart sounds.  Exam reveals no gallop.   No murmur heard. Pulses:      Dorsalis pedis pulses are 0 on the right side, and 0 on the left side.       Posterior tibial pulses are 0 on the right side, and 0 on the left side.  Pulmonary/Chest: Breath sounds normal. No respiratory distress. He has no wheezes.  Abdominal: Soft. Bowel sounds are normal. He exhibits no distension. There is no hepatosplenomegaly. There is no tenderness. There is no guarding and no CVA tenderness.  Musculoskeletal: He exhibits no edema.       Right shoulder: He exhibits no swelling and no deformity.  Neurological: He is alert and oriented to person, place, and time. He has normal strength and normal reflexes. No cranial nerve deficit or sensory deficit. Gait normal.  Skin: Skin is warm and dry. No rash noted. No cyanosis. Nails show no clubbing.  Psychiatric: He has a normal mood and affect. His speech is normal and behavior is normal. Thought content normal. Cognition and memory are normal.    Results for orders placed or performed in visit on 05/01/17  CMP 10231  Result Value Ref Range   Sodium 141 135 - 146 mmol/L   Potassium 4.0 3.5 - 5.3 mmol/L   Chloride 103 98 - 110 mmol/L   CO2 26 20 - 31 mmol/L   Glucose, Bld 142 (H) 65 - 99 mg/dL   BUN 16 7 - 25 mg/dL   Creat 0.88 0.70 - 1.18 mg/dL   Total Bilirubin 0.4 0.2 - 1.2 mg/dL   Alkaline Phosphatase 61 40 - 115 U/L   AST 12 10 - 35 U/L   ALT 14 9 - 46 U/L   Total Protein 6.1 6.1 - 8.1 g/dL   Albumin 3.8 3.6 - 5.1 g/dL   Calcium 9.2 8.6 - 10.3 mg/dL   Globulin 2.3 1.9 - 3.7 g/dL   AG Ratio 1.7 1.0 - 2.5 Ratio   BUN/Creatinine  Ratio 18.2 6 - 22 Ratio   GFR, Est African American >89 >=60 mL/min   GFR, Est Non African American 86 >=60 mL/min  Hemoglobin A1c  Result Value Ref Range   Hgb A1c MFr Bld 7.7 (H) <5.7 %   Mean Plasma Glucose 174 mg/dL   Diabetic Labs (most recent): Lab Results  Component Value Date   HGBA1C 7.7 (H) 05/01/2017  HGBA1C 7.4 (H) 01/22/2017   HGBA1C 7.5 (H) 10/24/2016    Assessment & Plan:   1. Type 2 diabetes mellitus with other circulatory complications -He remains at risk for more complications of diabetes. -He came with Slight increase in his A1c is stable at  7.7%<- 7.4% <-7.5% <-7.3%<-  6.8% . - He has a steady weight over the last 3 visits. - Patient is advised to stick to a routine mealtimes to eat 3 meals  a day and avoid unnecessary snacks ( to snack only to correct hypoglycemia). Patient is advised to eliminate simple carbs  from their diet including cakes desserts ice cream soda (  diet and regular) , sweet tea , Candies,  chips and cookies, artificial sweeteners,   and "sugar-free" products .  This will help patient to have stable blood glucose profile and potentially lose weight. Patient is given detailed personalized glucose monitoring and insulin dosing instructions. Patient is instructed to call back with extremes of blood glucose less than 70 or greater than 300.  Based on his current progress, he will not need insulin for now.   He is  advised to  Continue   Metformin 1000mg  po BID, and  D/c Invokana . - The concern in his case is his recent  weight gain which will definitely make control of diabetes more difficult. - I approached him and he agrees for weekly injection of Trulicity 5.88 mg, to advance as tolerated. - Get him samples from clinic.  2. Hyperlipidemia: I advised him to continue Pravastatin 40 mg po qhs.  3. Obesity Dietitian consult in progress. Encouraged to continue carbs restriction. I encouraged him to use [employer] facilities for water aerobics as  well as stationary bikes.   4. Essential hypertension uncontrolled. Continue current meds including ACEI, beta blocker, CCB.  5) vitamin D deficiency:  He is status post therapy with vitamin D 50,000 units weekly for 12 weeks.   Follow up plan: Return in about 3 months (around 08/03/2017) for follow up with pre-visit labs. With meter and logs for f/u.  Glade Lloyd, MD Phone: [phone]  Fax: [phone]   05/03/2017, 10:06 AM

## 2017-05-03 NOTE — Patient Instructions (Signed)

## 2017-06-15 ENCOUNTER — Other Ambulatory Visit: Payer: Self-pay | Admitting: "Endocrinology

## 2017-06-27 ENCOUNTER — Other Ambulatory Visit (HOSPITAL_COMMUNITY): Payer: Self-pay | Admitting: Oncology

## 2017-06-27 ENCOUNTER — Telehealth (HOSPITAL_COMMUNITY): Payer: Self-pay | Admitting: *Deleted

## 2017-06-27 DIAGNOSIS — I82402 Acute embolism and thrombosis of unspecified deep veins of left lower extremity: Secondary | ICD-10-CM

## 2017-06-27 NOTE — Telephone Encounter (Signed)
Wife notified and message sent to scheduling.

## 2017-06-27 NOTE — Telephone Encounter (Signed)
Dr.Zhou,  Patient and wife want to know if he can have an [place] on his legs prior to his upcoming 10/16 appt. They want to be sure the clots are gone. He states he still has a lot of pain in his legs. Patient and wife would feel better if he could have the [place]. Please advise.

## 2017-07-09 ENCOUNTER — Ambulatory Visit (HOSPITAL_COMMUNITY): Admission: RE | Admit: 2017-07-09 | Payer: PPO | Source: Ambulatory Visit

## 2017-07-13 ENCOUNTER — Other Ambulatory Visit (HOSPITAL_COMMUNITY): Payer: Self-pay | Admitting: Oncology

## 2017-07-16 ENCOUNTER — Ambulatory Visit (HOSPITAL_COMMUNITY)
Admission: RE | Admit: 2017-07-16 | Discharge: 2017-07-16 | Disposition: A | Discharge status: Home / Self Care | Payer: PPO | Source: Ambulatory Visit | Attending: Oncology | Admitting: Oncology

## 2017-07-16 DIAGNOSIS — I82502 Chronic embolism and thrombosis of unspecified deep veins of left lower extremity: Secondary | ICD-10-CM | POA: Diagnosis not present

## 2017-07-16 DIAGNOSIS — I82402 Acute embolism and thrombosis of unspecified deep veins of left lower extremity: Secondary | ICD-10-CM

## 2017-07-16 DIAGNOSIS — I82511 Chronic embolism and thrombosis of right femoral vein: Secondary | ICD-10-CM | POA: Diagnosis not present

## 2017-07-16 DIAGNOSIS — M79604 Pain in right leg: Secondary | ICD-10-CM | POA: Diagnosis present

## 2017-07-17 ENCOUNTER — Encounter (HOSPITAL_COMMUNITY): Payer: PPO | Attending: Oncology | Admitting: Oncology

## 2017-07-17 ENCOUNTER — Encounter (HOSPITAL_COMMUNITY): Payer: Self-pay | Admitting: Oncology

## 2017-07-17 VITALS — BP 154/71 | HR 70 | Resp 16 | Ht 68.0 in | Wt 273.0 lb

## 2017-07-17 DIAGNOSIS — I82511 Chronic embolism and thrombosis of right femoral vein: Secondary | ICD-10-CM

## 2017-07-17 DIAGNOSIS — D6851 Activated protein C resistance: Secondary | ICD-10-CM | POA: Diagnosis not present

## 2017-07-17 DIAGNOSIS — Z7901 Long term (current) use of anticoagulants: Secondary | ICD-10-CM | POA: Diagnosis not present

## 2017-07-17 DIAGNOSIS — D6861 Antiphospholipid syndrome: Secondary | ICD-10-CM | POA: Diagnosis not present

## 2017-07-17 DIAGNOSIS — I82401 Acute embolism and thrombosis of unspecified deep veins of right lower extremity: Secondary | ICD-10-CM

## 2017-07-17 DIAGNOSIS — Z23 Encounter for immunization: Secondary | ICD-10-CM | POA: Diagnosis not present

## 2017-07-17 MED ORDER — INFLUENZA VAC SPLIT QUAD 0.5 ML IM SUSY
PREFILLED_SYRINGE | INTRAMUSCULAR | Status: AC
  Filled 2017-07-17: qty 0.5

## 2017-07-17 MED ORDER — INFLUENZA VAC SPLIT QUAD 0.5 ML IM SUSY
0.5000 mL | PREFILLED_SYRINGE | Freq: Once | INTRAMUSCULAR | Status: AC
  Administered 2017-07-17: 0.5 mL via INTRAMUSCULAR

## 2017-07-17 NOTE — Progress Notes (Signed)
Pt given flu shot in right deltoid. Pt tolerated well. Pt stable and discharged home ambulatory with wife.

## 2017-07-17 NOTE — Progress Notes (Signed)
Sinda Du, MD [address] [place] [id number]  Need for prophylactic vaccination and inoculation against influenza - Plan: Influenza vac split quadrivalent PF (FLUARIX) injection 0.5 mL  Acute deep vein thrombosis (DVT) of right lower extremity, unspecified vein (HCC)  Chronic anticoagulation - Plan: CBC with Differential, Comprehensive metabolic panel  CURRENT THERAPY: Anticoagulation recommendations  INTERVAL HISTORY: Omar [name] 71 y.o. 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  CT imaging of chest, abdomen, and pelvis are negative for occult malignancy.  VTE history:  According to conversation with the patient, his wife, and review of his chart the following events occurred: In September 2016, patient developed a left lower extremity cellulitis.  He underwent bilateral lower extremity ultrasounds to evaluate for DVT and was identified to have a left femoral occlusive deep vein thrombosis extending to popliteal region.  He was started on Eliquis.  On September 16, 2015, repeat Doppler study was performed and persistent left lower extremity DVT was identified.  He continued on Eliquis.  Repeat ultrasound of left lower extremity on 12/10/2015 again identifies a persistent occlusive thrombus in the left femoral vein.  He continued on Eliquis.  He is referred to vascular surgery, Dr. Oneida Alar.  He was seen by the vascular surgery and according to documentation from Dr. Nona Dell dictation, "patient had a left leg venous duplex exam today.  This shows resolution of most of the thrombus within his common femoral superficial femoral femoral popliteal veins.  There is still some residual thrombus in the calf veins as well.  Superficial venous system was widely patent without reflux in the left leg.  He has  reflux in the deep femoral system."  At that appointment on 02/03/2016, anticoagulation with Eliquis was discontinued.  As result, the patient discontinued Eliquis and he started aspirin therapy for prophylaxis.  Patient was then noted to develop right leg discomfort which led to a right Doppler study on 08/21/2016 that demonstrated a right lower extremity DVT in the right common femoral vein extending down into the calf.  He was restarted on Eliquis.  Repeat imaging on 12/09/2016 of bilateral lower extremity ultrasounds demonstrates a persistent right DVT in the lower extremity and the right femoral and popliteal veins.  Of note, bilateral ultrasound on 12/09/2016 demonstrates resolution of left lower extremity DVT.  Patient presents with his wife. He states he has been doing well. 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 He continues to take eliquis without any issues except for easy bruisability on his forearms. He has chronic leg swelling, right worse than left. He denies any new chest pain or shortness of breath. Denies any bleeding including melena, hematochezia, hematuria.  Review of Systems  Constitutional: Negative for chills, fever, malaise/fatigue and weight loss.  HENT: Negative.   Eyes: Negative.   Respiratory: Negative.  Negative for cough.   Cardiovascular: Positive for leg swelling. Negative for chest pain.  Gastrointestinal: Negative.  Negative for blood in stool, constipation, diarrhea, melena, nausea and vomiting.  Genitourinary: Negative.   Musculoskeletal: Negative.   Skin: Negative for itching and rash.  Neurological: Negative.  Negative for weakness.  Endo/Heme/Allergies: Bruises/bleeds easily.  Psychiatric/Behavioral: Negative.     Past Medical History:  Diagnosis Date  . Abnormal EKG  Inferior Q waves  . BPH (benign prostatic hyperplasia)   . Chronic anticoagulation  01/16/2017  . Coronary artery disease   . Degenerative joint disease   . Diabetes mellitus, type 2 (HCC)    No insulin  . DVT (deep venous thrombosis) ([place])   . DVT of leg (deep venous thrombosis) ([place]) 07/02/2015  . Heart murmur   . Hepatic steatosis   . Heterozygous factor V Leiden mutation ([place]) 01/16/2017  . Hyperlipidemia   . 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 Meningioma (HCC)    Left frontal; CVA identified by MRI; no neurologic symptoms  . Neuropathy   . Obesity   . Sleep apnea 2008   Dr. Merlene Laughter interpreted the study-CPAP recommended, but refused by patient  . Stroke [facility])    no deficits from stroke, "Didnt know I had one when they say I did".    Past Surgical History:  Procedure Laterality Date  . CATARACT EXTRACTION W/PHACO Left 04/03/2016   Procedure: CATARACT EXTRACTION PHACO AND INTRAOCULAR LENS PLACEMENT LEFT EYE CDE=21.76;  Surgeon: Williams Che, MD;  Location: [facility];  Service: Ophthalmology;  Laterality: Left;  . CATARACT EXTRACTION W/PHACO Right 06/19/2016   Procedure: CATARACT EXTRACTION PHACO AND INTRAOCULAR LENS PLACEMENT; CDE:  11.56;  Surgeon: Williams Che, MD;  Location: [facility];  Service: Ophthalmology;  Laterality: Right;  . COLONOSCOPY W/ POLYPECTOMY  05/2008   polypectomy x4-adenomatous; internal hemorrhoids  . CORONARY ANGIOPLASTY WITH STENT PLACEMENT  06/04/2012     DES    to [place]    . DUPUYTREN / PALMAR FASCIOTOMY  2009   rt hand-dsc-went home same day x2  . LEFT HEART CATHETERIZATION WITH CORONARY ANGIOGRAM N/A 05/28/2012   Procedure: LEFT HEART CATHETERIZATION WITH CORONARY ANGIOGRAM;  Surgeon: Laverda Page, MD;  Location: [facility] CATH LAB;  Service: Cardiovascular;  Laterality: N/A;  . PERCUTANEOUS CORONARY STENT INTERVENTION (PCI-S) N/A 06/04/2012   Procedure: PERCUTANEOUS CORONARY STENT INTERVENTION (PCI-S);  Surgeon: Laverda Page, MD;   Location: [facility] CATH LAB;  Service: Cardiovascular;  Laterality: N/A;  . TONSILLECTOMY    . VENTRAL HERNIA REPAIR  2010   umb hernia-[facility]-went home same day    Family History  Problem Relation Age of Onset  . Cancer Mother   . Cancer Father   . Coronary artery disease Neg Hx     Social History   Social History  . Marital status: Married    Spouse name: N/A  . Number of children: N/A  . Years of education: N/A   Occupational History  . Manufacturing engineer   Social History Main Topics  . Smoking status: Never Smoker  . Smokeless tobacco: Never Used  . Alcohol use No  . Drug use: No  . Sexual activity: Not Currently    Birth control/ protection: None   Other Topics Concern  . None   Social History Narrative  . None     PHYSICAL EXAMINATION  ECOG PERFORMANCE STATUS: 1 - Symptomatic but completely ambulatory  Vitals:   07/17/17 1050  BP: (!) 154/71  Pulse: 70  Resp: 16  SpO2: 95%   Constitutional: Well-developed, well-nourished, and in no distress.   HENT:  Head: Normocephalic and atraumatic.  Mouth/Throat: No oropharyngeal exudate. Mucosa moist. Eyes: Pupils are equal, round, and reactive to light. Conjunctivae are normal. No scleral icterus.  Neck: Normal range  of motion. Neck supple. No JVD present.  Cardiovascular: Normal rate, regular rhythm and normal heart sounds.  Exam reveals no gallop and no friction rub.   No murmur heard. Pulmonary/Chest: Effort normal and breath sounds normal. No respiratory distress. No wheezes.No rales.  Abdominal: Soft. Bowel sounds are normal. No distension. There is no tenderness. There is no guarding.  Musculoskeletal: No edema or tenderness.  Lymphadenopathy:    No cervical or supraclavicular adenopathy.  Neurological: Alert and oriented to person, place, and time. No cranial nerve deficit.  Skin: Skin is warm and dry. No rash noted. No erythema. No pallor.  Psychiatric: Affect and judgment normal.      LABORATORY DATA: CBC    Component Value Date/Time   WBC 8.9 12/26/2016 1656   RBC 4.94 12/26/2016 1656   HGB 15.2 12/26/2016 1656   HCT 44.8 12/26/2016 1656   PLT 361 12/26/2016 1656   MCV 90.7 12/26/2016 1656   MCH 30.8 12/26/2016 1656   MCHC 33.9 12/26/2016 1656   RDW 14.9 12/26/2016 1656   LYMPHSABS 2.9 12/26/2016 1656   MONOABS 0.9 12/26/2016 1656   EOSABS 0.1 12/26/2016 1656   BASOSABS 0.0 12/26/2016 1656      Chemistry      Component Value Date/Time   NA 141 05/01/2017 0717   K 4.0 05/01/2017 0717   CL 103 05/01/2017 0717   CO2 26 05/01/2017 0717   BUN 16 05/01/2017 0717   CREATININE 0.88 05/01/2017 0717      Component Value Date/Time   CALCIUM 9.2 05/01/2017 0717   ALKPHOS 61 05/01/2017 0717   AST 12 05/01/2017 0717   ALT 14 05/01/2017 0717   BILITOT 0.4 05/01/2017 0717        PENDING LABS:   RADIOGRAPHIC STUDIES:  US Venous Img Lower Bilateral  Result Date: 07/16/2017 CLINICAL DATA:  Prior deep venous thrombosis in right femoral and popliteal veins EXAM: BILATERAL LOWER EXTREMITY VENOUS DUPLEX ULTRASOUND TECHNIQUE: Gray-scale sonography with graded compression, as well as color Doppler and duplex ultrasound were performed to evaluate the lower extremity deep venous systems from the level of the common femoral vein and including the common femoral, femoral, profunda femoral, popliteal and calf veins including the posterior tibial, peroneal and gastrocnemius veins when visible. The superficial great saphenous vein was also interrogated. Spectral Doppler was utilized to evaluate flow at rest and with distal augmentation maneuvers in the common femoral, femoral and popliteal veins. COMPARISON:  December 09, 2016 FINDINGS: RIGHT LOWER EXTREMITY Common Femoral Vein: No evidence of thrombus. Normal compressibility, respiratory phasicity and response to augmentation. Saphenofemoral Junction: No evidence of thrombus. Normal compressibility and flow on color Doppler  imaging. Profunda Femoral Vein: No evidence of thrombus. Normal compressibility and flow on color Doppler imaging. 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 There is narrowing and increased echogenicity in one of the right femoral vein branches with loss of Doppler signal, compression, and augmentation in this vessel consistent with chronic deep venous thrombosis in the right femoral vein in this area. Popliteal Vein: No evidence of thrombus. Previous deep venous thrombosis in this area appears resolved. Normal compressibility, respiratory phasicity and response to augmentation. Calf Veins: No evidence of thrombus. Normal compressibility and flow on color Doppler imaging. Superficial Great Saphenous Vein: No evidence of thrombus. Normal compressibility. Venous Reflux:  None. Other Findings:  None. LEFT LOWER EXTREMITY Common Femoral Vein: No evidence of thrombus. Normal compressibility, respiratory phasicity and response to augmentation. Saphenofemoral Junction: No evidence of thrombus.  Normal compressibility and flow on color Doppler imaging. Profunda Femoral Vein: No evidence of thrombus. Normal compressibility and flow on color Doppler imaging. Femoral Vein: No evidence of thrombus. Normal compressibility, respiratory phasicity and response to augmentation. Popliteal Vein: No evidence of thrombus. Normal compressibility, respiratory phasicity and response to augmentation. Calf Veins: No evidence of thrombus. Normal compressibility and flow on color Doppler imaging. Superficial Great Saphenous Vein: No evidence of thrombus. Normal compressibility. Venous Reflux:  None. Other Findings:  None. 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 No acute appearing deep venous thrombosis evident on the right side. Previous deep venous thrombosis in the right popliteal vein is no longer appreciable and appears to have resolved. No evidence of deep  venous thrombosis in the left lower extremity. Electronically Signed   By: Lowella Grip III M.D.   On: 07/16/2017 08:18     PATHOLOGY:    ASSESSMENT AND PLAN:  Recurrent DVT WITHOUT PE with hypercoagulable work-up demonstrating heterozygous Factor V Leiden (single R506Q mutation identified) and IgM anticardiolipin low-medium positive.  Both are low thrombophilia risks, but patient has other risk factors including obesity, history of testosterone replacement therapy stopped in March 2018, and immobility.   CT imaging of chest, abdomen, and pelvis are negative for occult malignancy.  Due to recurrence of DVT, recommend lifelong anticoagulation.  DVT of leg (deep venous thrombosis) ([place]) Initially a left leg DVT thought to be provoked by a long trip to [place].  Treated with anticoagulation x 9 months with resolution of DVT.  He was on prophylaxis ASA, but developed a right DVT 6 months later after being off anticoagulation.  Cause of this DVT is thought to be unprovoked.  Restarted on anticoagulation with Eliquis.  Lifelong anticoagulation is recommended.  Chronic anticoagulation Lifelong anticoagulation is recommended for an unprovoked right lower extremity DVT.  On Eliquis, and tolerating well without any major stigmata of bleeding. Reviewed his recent doppler venous US with him and his wife in detail. He has a chronic DVT in a branch of a duplicate right femoral vein, but otherwise no new acute DVTs.  Flu vaccine given today per patient's request.  RTC in 6 months for follow up with repeat labs.  ORDERS PLACED FOR THIS ENCOUNTER: Orders Placed This Encounter  Procedures  . CBC with Differential  . Comprehensive metabolic panel    MEDICATIONS PRESCRIBED THIS ENCOUNTER: Meds ordered this encounter  Medications  . Influenza vac split quadrivalent PF (FLUARIX) injection 0.5 mL    THERAPY PLAN:  Recommend lifelong anticoagulation given his right LE DVT is thought to be  unprovoked.  All questions were answered. The patient knows to call the clinic with any problems, questions or concerns. We can certainly see the patient much sooner if necessary.  This note is electronically signed by: Twana First, MD 07/17/2017 11:39 AM

## 2017-07-18 ENCOUNTER — Ambulatory Visit (HOSPITAL_COMMUNITY): Payer: PPO | Admitting: Oncology

## 2017-07-26 ENCOUNTER — Other Ambulatory Visit: Payer: Self-pay | Admitting: "Endocrinology

## 2017-08-02 DIAGNOSIS — R0989 Other specified symptoms and signs involving the circulatory and respiratory systems: Secondary | ICD-10-CM | POA: Diagnosis not present

## 2017-08-02 DIAGNOSIS — Z86718 Personal history of other venous thrombosis and embolism: Secondary | ICD-10-CM | POA: Diagnosis not present

## 2017-08-02 DIAGNOSIS — I2510 Atherosclerotic heart disease of native coronary artery without angina pectoris: Secondary | ICD-10-CM | POA: Diagnosis not present

## 2017-08-02 DIAGNOSIS — I10 Essential (primary) hypertension: Secondary | ICD-10-CM | POA: Diagnosis not present

## 2017-08-07 DIAGNOSIS — E1159 Type 2 diabetes mellitus with other circulatory complications: Secondary | ICD-10-CM | POA: Diagnosis not present

## 2017-08-07 DIAGNOSIS — E782 Mixed hyperlipidemia: Secondary | ICD-10-CM | POA: Diagnosis not present

## 2017-08-08 LAB — RENAL FUNCTION PANEL
ALBUMIN MSPROF: 3.8 g/dL (ref 3.6–5.1)
BUN: 12 mg/dL (ref 7–25)
CALCIUM: 9.2 mg/dL (ref 8.6–10.3)
CHLORIDE: 106 mmol/L (ref 98–110)
CO2: 28 mmol/L (ref 20–32)
Creat: 0.76 mg/dL (ref 0.70–1.18)
Glucose, Bld: 129 mg/dL — ABNORMAL HIGH (ref 65–99)
PHOSPHORUS: 2.9 mg/dL (ref 2.1–4.3)
Potassium: 4 mmol/L (ref 3.5–5.3)
Sodium: 143 mmol/L (ref 135–146)

## 2017-08-08 LAB — LIPID PANEL
Cholesterol: 196 mg/dL (ref <200)
HDL: 62 mg/dL (ref >40)
LDL Cholesterol (Calc): 111 mg/dL (calc) — ABNORMAL HIGH
NON-HDL CHOLESTEROL (CALC): 134 mg/dL — AB (ref <130)
TRIGLYCERIDES: 124 mg/dL (ref <150)
Total CHOL/HDL Ratio: 3.2 (calc) (ref <5.0)

## 2017-08-08 LAB — HEMOGLOBIN A1C
Hgb A1c MFr Bld: 6.8 % of total Hgb — ABNORMAL HIGH (ref <5.7)
Mean Plasma Glucose: 148 (calc)
eAG (mmol/L): 8.2 (calc)

## 2017-08-09 ENCOUNTER — Encounter: Payer: Self-pay | Admitting: "Endocrinology

## 2017-08-09 ENCOUNTER — Ambulatory Visit: Payer: PPO | Admitting: "Endocrinology

## 2017-08-09 VITALS — BP 130/86 | HR 64 | Ht 68.0 in | Wt 267.0 lb

## 2017-08-09 DIAGNOSIS — I10 Essential (primary) hypertension: Secondary | ICD-10-CM | POA: Diagnosis not present

## 2017-08-09 DIAGNOSIS — E1159 Type 2 diabetes mellitus with other circulatory complications: Secondary | ICD-10-CM

## 2017-08-09 DIAGNOSIS — E782 Mixed hyperlipidemia: Secondary | ICD-10-CM | POA: Diagnosis not present

## 2017-08-09 NOTE — Progress Notes (Signed)
Subjective:    Patient ID: Omar [name], male    DOB: Jun 15, 1946,    Past Medical History:  Diagnosis Date  . Abnormal EKG    Inferior Q waves  . BPH (benign prostatic hyperplasia)   . Chronic anticoagulation 01/16/2017  . Coronary artery disease   . Degenerative joint disease   . Diabetes mellitus, type 2 (HCC)    No insulin  . DVT (deep venous thrombosis) ([place])   . DVT of leg (deep venous thrombosis) ([place]) 07/02/2015  . Heart murmur   . Hepatic steatosis   . Heterozygous factor V Leiden mutation ([place]) 01/16/2017  . Hyperlipidemia   . 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 Meningioma (HCC)    Left frontal; CVA identified by MRI; no neurologic symptoms  . Neuropathy   . Obesity   . Sleep apnea 2008   Dr. Merlene Laughter interpreted the study-CPAP recommended, but refused by patient  . Stroke [facility])    no deficits from stroke, "Didnt know I had one when they say I did".   Past Surgical History:  Procedure Laterality Date  . COLONOSCOPY W/ POLYPECTOMY  05/2008   polypectomy x4-adenomatous; internal hemorrhoids  . CORONARY ANGIOPLASTY WITH STENT PLACEMENT  06/04/2012     DES    to [place]    . DUPUYTREN / PALMAR FASCIOTOMY  2009   rt hand-dsc-went home same day x2  . TONSILLECTOMY    . VENTRAL HERNIA REPAIR  2010   umb hernia-AP-went home same day   Social History   Socioeconomic History  . Marital status: Married    Spouse name: None  . Number of children: None  . Years of education: None  . Highest education level: None  Social Needs  . Financial resource strain: None  . Food insecurity - worry: None  . Food insecurity - inability: None  . Transportation needs - medical: None  . Transportation needs - non-medical: None  Occupational History  . Occupation: Games developer    CommentAdvertising account executive  Tobacco Use  . Smoking status: Never Smoker  . Smokeless  tobacco: Never Used  Substance and Sexual Activity  . Alcohol use: No    Alcohol/week: 0.0 oz  . Drug use: No  . Sexual activity: Not Currently    Birth control/protection: None  Other Topics Concern  . None  Social History Narrative  . None   Outpatient Encounter Medications as of 08/09/2017  Medication Sig  . apixaban (ELIQUIS) 5 MG TABS tablet Take 1 tablet (5 mg total) by mouth 2 (two) times daily. For the first 7 days, take 2 tablets twice a day. Then begin 1 tablet twice a day.  . Dulaglutide (TRULICITY) 1.32 GM/0.1UU SOPN Inject 0.75 mg into the skin once a week.  Marland Kitchen eplerenone (INSPRA) 25 MG tablet Take 25 mg by mouth daily.  Marland Kitchen gabapentin (NEURONTIN) 100 MG capsule TAKE (1) CAPSULE BY MOUTH TWICE DAILY.  Marland Kitchen lisinopril-hydrochlorothiazide (PRINZIDE,ZESTORETIC) 20-25 MG per tablet Take 1 tablet by mouth daily.  . metFORMIN (GLUCOPHAGE) 1000 MG tablet TAKE (1) TABLET BY MOUTH TWICE DAILY.  . Multiple Vitamins-Minerals (ONE-A-DAY 50 PLUS PO) Take 1 tablet by mouth daily.  . nebivolol (BYSTOLIC) 10 MG tablet Take 20 mg by mouth daily.  . nitroGLYCERIN (NITROSTAT) 0.4 MG SL tablet Place 0.4 mg under the tongue every 5 (five) minutes as needed for chest pain.  Marland Kitchen  pravastatin (PRAVACHOL) 40 MG tablet Take 40 mg by mouth daily.  . Tamsulosin HCl (FLOMAX) 0.4 MG CAPS Take 0.4 mg by mouth daily after breakfast.  . torsemide (DEMADEX) 20 MG tablet   . traMADol (ULTRAM) 50 MG tablet Take 1 tablet (50 mg total) by mouth every 6 (six) hours as needed.  . traZODone (DESYREL) 100 MG tablet Take 100 mg by mouth at bedtime.  . verapamil (CALAN-SR) 240 MG CR tablet Take 1 tablet (240 mg total) by mouth at bedtime.  . verapamil (VERELAN PM) 240 MG 24 hr capsule   . [DISCONTINUED] metFORMIN (GLUCOPHAGE) 500 MG tablet TAKE (2) TABLETS BY MOUTH TWICE DAILY.   No facility-administered encounter medications on file as of 08/09/2017.    ALLERGIES: No Known Allergies VACCINATION STATUS: Immunization  History  Administered Date(s) Administered  . Influenza,inj,Quad PF,6+ Mos 07/17/2017    Diabetes  He presents for his follow-up diabetic visit. He has type 2 diabetes mellitus. The initial diagnosis of diabetes was made 16 years ago. His disease course has been worsening. There are no hypoglycemic associated symptoms. Pertinent negatives for hypoglycemia include no confusion, headaches, pallor or seizures. Pertinent negatives for diabetes include no chest pain, no fatigue, no polydipsia, no polyphagia, no polyuria and no weakness. Symptoms are worsening. Diabetic complications include heart disease and PVD. Risk factors for coronary artery disease include diabetes mellitus, dyslipidemia, obesity, male sex, hypertension and sedentary lifestyle. Current diabetic treatment includes oral agent (monotherapy). He is compliant with treatment some of the time. His weight is stable (He has a steady weight after he lost close to 65 pounds.). He is following a generally unhealthy diet. He has had a previous visit with a dietitian. He participates in exercise intermittently. An ACE inhibitor/angiotensin II receptor blocker is being taken. Eye exam is current.  Hypertension  This is a chronic problem. The current episode started more than 1 year ago. The problem is uncontrolled. Pertinent negatives include no chest pain, headaches, neck pain, palpitations or shortness of breath. Past treatments include ACE inhibitors, beta blockers and calcium channel blockers. The current treatment provides no improvement. Hypertensive end-organ damage includes CAD/MI and PVD.  Hyperlipidemia  This is a chronic problem. The current episode started more than 1 year ago. Pertinent negatives include no chest pain, myalgias or shortness of breath. Current antihyperlipidemic treatment includes statins. Risk factors for coronary artery disease include dyslipidemia, diabetes mellitus, hypertension, male sex, obesity and a sedentary  lifestyle.   He was recently treated for  DVT of LLE .  Review of Systems  Constitutional: Negative for fatigue and unexpected weight change.  HENT: Negative for dental problem, mouth sores and trouble swallowing.   Eyes: Negative for visual disturbance.  Respiratory: Negative for cough, choking, chest tightness, shortness of breath and wheezing.   Cardiovascular: Negative for chest pain, palpitations and leg swelling.  Gastrointestinal: Negative for abdominal distention, abdominal pain, constipation, diarrhea, nausea and vomiting.  Endocrine: Negative for polydipsia, polyphagia and polyuria.  Genitourinary: Negative for dysuria, flank pain, hematuria and urgency.  Musculoskeletal: Negative for back pain, gait problem, joint swelling, myalgias and neck pain.  Skin: Negative for pallor, rash and wound.  Neurological: Negative for seizures, syncope, weakness, numbness and headaches.  Psychiatric/Behavioral: Negative for confusion, dysphoric mood, hallucinations and suicidal ideas.    Objective:    BP 130/86   Pulse 64   Ht 5\' 8"  (1.727 m)   Wt 267 lb (121.1 kg)   BMI 40.60 kg/m   Wt Readings from Last  3 Encounters:  08/09/17 267 lb (121.1 kg)  07/17/17 273 lb (123.8 kg)  05/03/17 273 lb (123.8 kg)    Physical Exam  Constitutional: He is oriented to person, place, and time. He appears well-developed and well-nourished. He is cooperative.  HENT:  Head: Normocephalic and atraumatic.  Eyes: EOM are normal.  Neck: Normal range of motion. Neck supple. No tracheal deviation present. No thyromegaly present.  Cardiovascular: Normal rate and normal heart sounds. Exam reveals no gallop.  No murmur heard. Pulses:      Dorsalis pedis pulses are 0 on the right side, and 0 on the left side.       Posterior tibial pulses are 0 on the right side, and 0 on the left side.  Pulmonary/Chest: Breath sounds normal. No respiratory distress. He has no wheezes.  Abdominal: Soft. Bowel sounds are  normal. He exhibits no distension. There is no hepatosplenomegaly. There is no tenderness. There is no guarding and no CVA tenderness.  Musculoskeletal: He exhibits no edema.       Right shoulder: He exhibits no swelling and no deformity.  Neurological: He is alert and oriented to person, place, and time. He has normal strength and normal reflexes. No cranial nerve deficit or sensory deficit. Gait normal.  Skin: Skin is warm and dry. No rash noted. No cyanosis. Nails show no clubbing.  Psychiatric: He has a normal mood and affect. His speech is normal and behavior is normal. Thought content normal. Cognition and memory are normal.    Results for orders placed or performed in visit on 05/03/17  Renal function panel  Result Value Ref Range   Glucose, Bld 129 (H) 65 - 99 mg/dL   BUN 12 7 - 25 mg/dL   Creat 0.76 0.70 - 1.18 mg/dL   BUN/Creatinine Ratio NOT APPLICABLE 6 - 22 (calc)   Sodium 143 135 - 146 mmol/L   Potassium 4.0 3.5 - 5.3 mmol/L   Chloride 106 98 - 110 mmol/L   CO2 28 20 - 32 mmol/L   Calcium 9.2 8.6 - 10.3 mg/dL   Phosphorus 2.9 2.1 - 4.3 mg/dL   Albumin 3.8 3.6 - 5.1 g/dL  Hemoglobin A1c  Result Value Ref Range   Hgb A1c MFr Bld 6.8 (H) <5.7 % of total Hgb   Mean Plasma Glucose 148 (calc)   eAG (mmol/L) 8.2 (calc)  Lipid panel  Result Value Ref Range   Cholesterol 196 <200 mg/dL   HDL 62 >40 mg/dL   Triglycerides 124 <150 mg/dL   LDL Cholesterol (Calc) 111 (H) mg/dL (calc)   Total CHOL/HDL Ratio 3.2 <5.0 (calc)   Non-HDL Cholesterol (Calc) 134 (H) <130 mg/dL (calc)   Diabetic Labs (most recent): Lab Results  Component Value Date   HGBA1C 6.8 (H) 08/07/2017   HGBA1C 7.7 (H) 05/01/2017   HGBA1C 7.4 (H) 01/22/2017    Assessment & Plan:   1. Type 2 diabetes mellitus with other circulatory complications -He remains at risk for more complications of diabetes. -He came with Slight increase in his A1c is stable at  6.8%<- 7.7%<- 7.4% <-7.5% <-7.3%<-  6.8% . - He  has  Lost 7 lbs since last visit.   - Patient is advised to stick to a routine mealtimes to eat 3 meals  a day and avoid unnecessary snacks ( to snack only to correct hypoglycemia).  -  Suggestion is made for him to avoid simple carbohydrates  from his diet including Cakes, Sweet Desserts / Pastries, Ice Cream,  Soda (diet and regular), Sweet Tea, Candies, Chips, Cookies, Store Bought Juices, Alcohol in Excess of  1-2 drinks a day, Artificial Sweeteners, and "Sugar-free" Products. This will help patient to have stable blood glucose profile and potentially avoid unintended weight gain.   Based on his current progress, he will not need insulin for now.   He is  advised to  Continue   Metformin 1000mg  by mouth twice a day.  - He has tolerated Trulicity 5.00 mg, however he is currently on insurance gap to continue on this medication. He is given samples from clinic to cover his coverage gap.  2. Hyperlipidemia: I advised him to continue Pravastatin 40 mg po qhs.  3. Obesity Dietitian consult in progress. Encouraged to continue carbs restriction. I encouraged him to use [employer] facilities for water aerobics as well as stationary bikes.   4. Essential hypertension uncontrolled. Continue current meds including ACEI, beta blocker, CCB.  5) vitamin D deficiency:  He is status post therapy with vitamin D 50,000 units weekly for 12 weeks.   Follow up plan: Return in about 3 months (around 11/09/2017) for follow up with pre-visit labs. With meter and logs for f/u.  - Time spent with the patient: 25 min, of which >50% was spent in reviewing his current and  previous labs and medication doses and developing a plan to avoid hypo- and hyper-glycemia.    Glade Lloyd, MD Phone: [phone]  Fax: [phone]   -  This note was partially dictated with voice recognition software. Similar sounding words can be transcribed inadequately or may not  be corrected upon review.  08/09/2017, 9:04 AM

## 2017-08-09 NOTE — Patient Instructions (Signed)

## 2017-10-03 ENCOUNTER — Other Ambulatory Visit (HOSPITAL_COMMUNITY): Payer: Self-pay | Admitting: Pulmonary Disease

## 2017-10-03 DIAGNOSIS — I824Y2 Acute embolism and thrombosis of unspecified deep veins of left proximal lower extremity: Secondary | ICD-10-CM

## 2017-10-04 ENCOUNTER — Other Ambulatory Visit (HOSPITAL_COMMUNITY): Payer: Self-pay | Admitting: Pulmonary Disease

## 2017-10-04 DIAGNOSIS — R102 Pelvic and perineal pain: Secondary | ICD-10-CM

## 2017-10-05 ENCOUNTER — Ambulatory Visit (HOSPITAL_COMMUNITY)
Admission: RE | Admit: 2017-10-05 | Discharge: 2017-10-05 | Disposition: A | Discharge status: Home / Self Care | Payer: PPO | Source: Ambulatory Visit | Attending: Pulmonary Disease | Admitting: Pulmonary Disease

## 2017-10-05 ENCOUNTER — Other Ambulatory Visit (HOSPITAL_COMMUNITY): Payer: Self-pay | Admitting: Pulmonary Disease

## 2017-10-05 ENCOUNTER — Ambulatory Visit (HOSPITAL_COMMUNITY): Payer: PPO

## 2017-10-05 DIAGNOSIS — R102 Pelvic and perineal pain: Secondary | ICD-10-CM | POA: Diagnosis not present

## 2017-10-05 DIAGNOSIS — M79651 Pain in right thigh: Secondary | ICD-10-CM | POA: Diagnosis not present

## 2017-10-10 DIAGNOSIS — E785 Hyperlipidemia, unspecified: Secondary | ICD-10-CM | POA: Diagnosis not present

## 2017-10-10 DIAGNOSIS — I10 Essential (primary) hypertension: Secondary | ICD-10-CM | POA: Diagnosis not present

## 2017-10-10 DIAGNOSIS — R600 Localized edema: Secondary | ICD-10-CM | POA: Diagnosis not present

## 2017-10-10 DIAGNOSIS — E1165 Type 2 diabetes mellitus with hyperglycemia: Secondary | ICD-10-CM | POA: Diagnosis not present

## 2017-10-12 ENCOUNTER — Other Ambulatory Visit (HOSPITAL_COMMUNITY): Payer: Self-pay | Admitting: Pulmonary Disease

## 2017-10-12 ENCOUNTER — Ambulatory Visit (HOSPITAL_COMMUNITY)
Admission: RE | Admit: 2017-10-12 | Discharge: 2017-10-12 | Disposition: A | Discharge status: Home / Self Care | Payer: PPO | Source: Ambulatory Visit | Attending: Pulmonary Disease | Admitting: Pulmonary Disease

## 2017-10-12 DIAGNOSIS — R600 Localized edema: Secondary | ICD-10-CM | POA: Diagnosis not present

## 2017-10-12 DIAGNOSIS — I82411 Acute embolism and thrombosis of right femoral vein: Secondary | ICD-10-CM | POA: Insufficient documentation

## 2017-10-12 DIAGNOSIS — Z0000 Encounter for general adult medical examination without abnormal findings: Secondary | ICD-10-CM | POA: Diagnosis not present

## 2017-10-12 DIAGNOSIS — M79604 Pain in right leg: Secondary | ICD-10-CM

## 2017-10-12 DIAGNOSIS — Z23 Encounter for immunization: Secondary | ICD-10-CM | POA: Diagnosis not present

## 2017-11-14 DIAGNOSIS — E1159 Type 2 diabetes mellitus with other circulatory complications: Secondary | ICD-10-CM | POA: Diagnosis not present

## 2017-11-15 LAB — COMPLETE METABOLIC PANEL WITH GFR
AG RATIO: 1.6 (calc) (ref 1.0–2.5)
ALKALINE PHOSPHATASE (APISO): 65 U/L (ref 40–115)
ALT: 23 U/L (ref 9–46)
AST: 20 U/L (ref 10–35)
Albumin: 4 g/dL (ref 3.6–5.1)
BILIRUBIN TOTAL: 0.4 mg/dL (ref 0.2–1.2)
BUN: 15 mg/dL (ref 7–25)
CHLORIDE: 103 mmol/L (ref 98–110)
CO2: 26 mmol/L (ref 20–32)
Calcium: 9.6 mg/dL (ref 8.6–10.3)
Creat: 0.93 mg/dL (ref 0.70–1.18)
GFR, Est African American: 94 mL/min/{1.73_m2} (ref >=60)
GFR, Est Non African American: 81 mL/min/{1.73_m2} (ref >=60)
GLUCOSE: 163 mg/dL — AB (ref 65–99)
Globulin: 2.5 g/dL (calc) (ref 1.9–3.7)
POTASSIUM: 4.1 mmol/L (ref 3.5–5.3)
Sodium: 139 mmol/L (ref 135–146)
Total Protein: 6.5 g/dL (ref 6.1–8.1)

## 2017-11-15 LAB — HEMOGLOBIN A1C
EAG (MMOL/L): 8.5 (calc)
HEMOGLOBIN A1C: 7 %{Hb} — AB (ref <5.7)
Mean Plasma Glucose: 154 (calc)

## 2017-11-15 LAB — T4, FREE: Free T4: 1 ng/dL (ref 0.8–1.8)

## 2017-11-15 LAB — TSH: TSH: 2.86 m[IU]/L (ref 0.40–4.50)

## 2017-11-15 LAB — VITAMIN D 25 HYDROXY (VIT D DEFICIENCY, FRACTURES): VIT D 25 HYDROXY: 24 ng/mL — AB (ref 30–100)

## 2017-11-16 ENCOUNTER — Ambulatory Visit: Payer: PPO | Admitting: "Endocrinology

## 2017-11-16 ENCOUNTER — Encounter: Payer: Self-pay | Admitting: "Endocrinology

## 2017-11-16 VITALS — BP 138/86 | HR 62 | Ht 68.0 in | Wt 273.0 lb

## 2017-11-16 DIAGNOSIS — E782 Mixed hyperlipidemia: Secondary | ICD-10-CM

## 2017-11-16 DIAGNOSIS — I10 Essential (primary) hypertension: Secondary | ICD-10-CM | POA: Diagnosis not present

## 2017-11-16 DIAGNOSIS — E559 Vitamin D deficiency, unspecified: Secondary | ICD-10-CM

## 2017-11-16 DIAGNOSIS — E1159 Type 2 diabetes mellitus with other circulatory complications: Secondary | ICD-10-CM

## 2017-11-16 MED ORDER — DULAGLUTIDE 1.5 MG/0.5ML ~~LOC~~ SOAJ: 1.5000 mg | SUBCUTANEOUS | 2 refills | Status: DC

## 2017-11-16 NOTE — Progress Notes (Signed)
Subjective:    Patient ID: Omar [name], male    DOB: 19-Dec-1945,    Past Medical History:  Diagnosis Date  . Abnormal EKG    Inferior Q waves  . BPH (benign prostatic hyperplasia)   . Chronic anticoagulation 01/16/2017  . Coronary artery disease   . Degenerative joint disease   . Diabetes mellitus, type 2 (HCC)    No insulin  . DVT (deep venous thrombosis) ([place])   . DVT of leg (deep venous thrombosis) ([place]) 07/02/2015  . Heart murmur   . Hepatic steatosis   . Heterozygous factor V Leiden mutation ([place]) 01/16/2017  . Hyperlipidemia   . 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 Meningioma (HCC)    Left frontal; CVA identified by MRI; no neurologic symptoms  . Neuropathy   . Obesity   . Sleep apnea 2008   Dr. Merlene Laughter interpreted the study-CPAP recommended, but refused by patient  . Stroke [facility])    no deficits from stroke, "Didnt know I had one when they say I did".   Past Surgical History:  Procedure Laterality Date  . CATARACT EXTRACTION W/PHACO Left 04/03/2016   Procedure: CATARACT EXTRACTION PHACO AND INTRAOCULAR LENS PLACEMENT LEFT EYE CDE=21.76;  Surgeon: Williams Che, MD;  Location: [facility];  Service: Ophthalmology;  Laterality: Left;  . CATARACT EXTRACTION W/PHACO Right 06/19/2016   Procedure: CATARACT EXTRACTION PHACO AND INTRAOCULAR LENS PLACEMENT; CDE:  11.56;  Surgeon: Williams Che, MD;  Location: [facility];  Service: Ophthalmology;  Laterality: Right;  . COLONOSCOPY W/ POLYPECTOMY  05/2008   polypectomy x4-adenomatous; internal hemorrhoids  . CORONARY ANGIOPLASTY WITH STENT PLACEMENT  06/04/2012     DES    to Jet    . DUPUYTREN / PALMAR FASCIOTOMY  2009   rt hand-dsc-went home same day x2  . LEFT HEART CATHETERIZATION WITH CORONARY ANGIOGRAM N/A 05/28/2012   Procedure: LEFT HEART CATHETERIZATION WITH CORONARY ANGIOGRAM;  Surgeon: Laverda Page, MD;   Location: [facility] CATH LAB;  Service: Cardiovascular;  Laterality: N/A;  . PERCUTANEOUS CORONARY STENT INTERVENTION (PCI-S) N/A 06/04/2012   Procedure: PERCUTANEOUS CORONARY STENT INTERVENTION (PCI-S);  Surgeon: Laverda Page, MD;  Location: [facility] CATH LAB;  Service: Cardiovascular;  Laterality: N/A;  . TONSILLECTOMY    . VENTRAL HERNIA REPAIR  2010   umb hernia-[facility]-went home same day   Social History   Socioeconomic History  . Marital status: Married    Spouse name: None  . Number of children: None  . Years of education: None  . Highest education level: None  Social Needs  . Financial resource strain: None  . Food insecurity - worry: None  . Food insecurity - inability: None  . Transportation needs - medical: None  . Transportation needs - non-medical: None  Occupational History  . Occupation: Games developer    CommentAdvertising account executive  Tobacco Use  . Smoking status: Never Smoker  . Smokeless tobacco: Never Used  Substance and Sexual Activity  . Alcohol use: No    Alcohol/week: 0.0 oz  . Drug use: No  . Sexual activity: Not Currently    Birth control/protection: None  Other Topics Concern  . None  Social History Narrative  . None   Outpatient Encounter Medications as of 11/16/2017  Medication Sig  . apixaban (ELIQUIS) 5 MG TABS tablet Take 1 tablet (5 mg total) by mouth 2 (two)  times daily. For the first 7 days, take 2 tablets twice a day. Then begin 1 tablet twice a day.  . Dulaglutide (TRULICITY) 1.5 DG/6.4QI SOPN Inject 1.5 mg into the skin once a week.  Marland Kitchen eplerenone (INSPRA) 25 MG tablet Take 25 mg by mouth daily.  Marland Kitchen gabapentin (NEURONTIN) 100 MG capsule TAKE (1) CAPSULE BY MOUTH TWICE DAILY.  Marland Kitchen lisinopril-hydrochlorothiazide (PRINZIDE,ZESTORETIC) 20-25 MG per tablet Take 1 tablet by mouth daily.  . metFORMIN (GLUCOPHAGE) 1000 MG tablet TAKE (1) TABLET BY MOUTH TWICE DAILY.  . Multiple Vitamins-Minerals (ONE-A-DAY 50 PLUS PO) Take 1 tablet by mouth daily.  .  nebivolol (BYSTOLIC) 10 MG tablet Take 20 mg by mouth daily.  . nitroGLYCERIN (NITROSTAT) 0.4 MG SL tablet Place 0.4 mg under the tongue every 5 (five) minutes as needed for chest pain.  . pravastatin (PRAVACHOL) 40 MG tablet Take 40 mg by mouth daily.  . Tamsulosin HCl (FLOMAX) 0.4 MG CAPS Take 0.4 mg by mouth daily after breakfast.  . torsemide (DEMADEX) 20 MG tablet   . traMADol (ULTRAM) 50 MG tablet Take 1 tablet (50 mg total) by mouth every 6 (six) hours as needed.  . traZODone (DESYREL) 100 MG tablet Take 100 mg by mouth at bedtime.  . verapamil (CALAN-SR) 240 MG CR tablet Take 1 tablet (240 mg total) by mouth at bedtime.  . [DISCONTINUED] Dulaglutide (TRULICITY) 3.47 QQ/5.9DG SOPN Inject 0.75 mg into the skin once a week.  . [DISCONTINUED] verapamil (VERELAN PM) 240 MG 24 hr capsule    No facility-administered encounter medications on file as of 11/16/2017.    ALLERGIES: No Known Allergies VACCINATION STATUS: Immunization History  Administered Date(s) Administered  . Influenza,inj,Quad PF,6+ Mos 07/17/2017    Diabetes  He presents for his follow-up diabetic visit. He has type 2 diabetes mellitus. Onset time: He was diagnosed at approximate age of 34 years. His disease course has been stable. There are no hypoglycemic associated symptoms. Pertinent negatives for hypoglycemia include no confusion, headaches, pallor or seizures. Pertinent negatives for diabetes include no chest pain, no fatigue, no polydipsia, no polyphagia, no polyuria and no weakness. Symptoms are stable. Diabetic complications include heart disease and PVD. Risk factors for coronary artery disease include diabetes mellitus, dyslipidemia, obesity, male sex, hypertension and sedentary lifestyle. Current diabetic treatment includes oral agent (monotherapy). He is compliant with treatment some of the time. His weight is increasing steadily. He is following a generally unhealthy diet. He has had a previous visit with a  dietitian. He participates in exercise intermittently. An ACE inhibitor/angiotensin II receptor blocker is being taken. Eye exam is current.  Hypertension  This is a chronic problem. The current episode started more than 1 year ago. The problem is uncontrolled. Pertinent negatives include no chest pain, headaches, neck pain, palpitations or shortness of breath. Risk factors for coronary artery disease include diabetes mellitus, dyslipidemia, male gender, obesity and sedentary lifestyle. Past treatments include ACE inhibitors, beta blockers and calcium channel blockers. The current treatment provides no improvement. Hypertensive end-organ damage includes CAD/MI and PVD.  Hyperlipidemia  This is a chronic problem. The current episode started more than 1 year ago. Exacerbating diseases include diabetes and obesity. Pertinent negatives include no chest pain, myalgias or shortness of breath. Current antihyperlipidemic treatment includes statins. Risk factors for coronary artery disease include dyslipidemia, diabetes mellitus, hypertension, male sex, obesity and a sedentary lifestyle.   He was recently treated for  DVT of LLE .  Review of Systems  Constitutional: Negative for fatigue  and unexpected weight change.  HENT: Negative for dental problem, mouth sores and trouble swallowing.   Eyes: Negative for visual disturbance.  Respiratory: Negative for cough, choking, chest tightness, shortness of breath and wheezing.   Cardiovascular: Negative for chest pain, palpitations and leg swelling.  Gastrointestinal: Negative for abdominal distention, abdominal pain, constipation, diarrhea, nausea and vomiting.  Endocrine: Negative for polydipsia, polyphagia and polyuria.  Genitourinary: Negative for dysuria, flank pain, hematuria and urgency.  Musculoskeletal: Negative for back pain, gait problem, joint swelling, myalgias and neck pain.  Skin: Negative for pallor, rash and wound.  Neurological: Negative for  seizures, syncope, weakness, numbness and headaches.  Psychiatric/Behavioral: Negative for confusion, dysphoric mood, hallucinations and suicidal ideas.    Objective:    BP 138/86   Pulse 62   Ht 5\' 8"  (1.727 m)   Wt 273 lb (123.8 kg)   BMI 41.51 kg/m   Wt Readings from Last 3 Encounters:  11/16/17 273 lb (123.8 kg)  08/09/17 267 lb (121.1 kg)  07/17/17 273 lb (123.8 kg)    Physical Exam  Constitutional: He is oriented to person, place, and time. He appears well-developed and well-nourished. He is cooperative.  HENT:  Head: Normocephalic and atraumatic.  Eyes: EOM are normal.  Neck: Normal range of motion. Neck supple. No tracheal deviation present. No thyromegaly present.  Cardiovascular: Normal rate and normal heart sounds. Exam reveals no gallop.  No murmur heard. Pulses:      Dorsalis pedis pulses are 0 on the right side, and 0 on the left side.       Posterior tibial pulses are 0 on the right side, and 0 on the left side.  Pulmonary/Chest: Breath sounds normal. No respiratory distress. He has no wheezes.  Abdominal: Soft. Bowel sounds are normal. He exhibits no distension. There is no hepatosplenomegaly. There is no tenderness. There is no guarding and no CVA tenderness.  Musculoskeletal: He exhibits no edema.       Right shoulder: He exhibits no swelling and no deformity.  Neurological: He is alert and oriented to person, place, and time. He has normal strength and normal reflexes. No cranial nerve deficit or sensory deficit. Gait normal.  Skin: Skin is warm and dry. No rash noted. No cyanosis. Nails show no clubbing.  Psychiatric: He has a normal mood and affect. His speech is normal and behavior is normal. Thought content normal. Cognition and memory are normal.    Results for orders placed or performed in visit on 08/09/17  COMPLETE METABOLIC PANEL WITH GFR  Result Value Ref Range   Glucose, Bld 163 (H) 65 - 99 mg/dL   BUN 15 7 - 25 mg/dL   Creat 0.93 0.70 - 1.18  mg/dL   GFR, Est Non African American 81 > OR = 60 mL/min/1.99m2   GFR, Est African American 94 > OR = 60 mL/min/1.73m2   BUN/Creatinine Ratio NOT APPLICABLE 6 - 22 (calc)   Sodium 139 135 - 146 mmol/L   Potassium 4.1 3.5 - 5.3 mmol/L   Chloride 103 98 - 110 mmol/L   CO2 26 20 - 32 mmol/L   Calcium 9.6 8.6 - 10.3 mg/dL   Total Protein 6.5 6.1 - 8.1 g/dL   Albumin 4.0 3.6 - 5.1 g/dL   Globulin 2.5 1.9 - 3.7 g/dL (calc)   AG Ratio 1.6 1.0 - 2.5 (calc)   Total Bilirubin 0.4 0.2 - 1.2 mg/dL   Alkaline phosphatase (APISO) 65 40 - 115 U/L   AST 20  10 - 35 U/L   ALT 23 9 - 46 U/L  Hemoglobin A1c  Result Value Ref Range   Hgb A1c MFr Bld 7.0 (H) <5.7 % of total Hgb   Mean Plasma Glucose 154 (calc)   eAG (mmol/L) 8.5 (calc)  TSH  Result Value Ref Range   TSH 2.86 0.40 - 4.50 mIU/L  T4, free  Result Value Ref Range   Free T4 1.0 0.8 - 1.8 ng/dL  VITAMIN D 25 Hydroxy (Vit-D Deficiency, Fractures)  Result Value Ref Range   Vit D, 25-Hydroxy 24 (L) 30 - 100 ng/mL   Diabetic Labs (most recent): Lab Results  Component Value Date   HGBA1C 7.0 (H) 11/14/2017   HGBA1C 6.8 (H) 08/07/2017   HGBA1C 7.7 (H) 05/01/2017    Assessment & Plan:   1. Type 2 diabetes mellitus with other circulatory complications -He remains at risk for more complications of diabetes. -He came with Slight increase in his A1c is stable at 7% from 6.8%.  - Patient is advised to stick to a routine mealtimes to eat 3 meals  a day and avoid unnecessary snacks ( to snack only to correct hypoglycemia).  -  Suggestion is made for him to avoid simple carbohydrates  from his diet including Cakes, Sweet Desserts / Pastries, Ice Cream, Soda (diet and regular), Sweet Tea, Candies, Chips, Cookies, Store Bought Juices, Alcohol in Excess of  1-2 drinks a day, Artificial Sweeteners, and "Sugar-free" Products. This will help patient to have stable blood glucose profile and potentially avoid unintended weight gain.  Based on his  current progress, he will not need insulin for now.   He is  advised to  Continue   Metformin 1000mg  by mouth twice a day.  - He has tolerated Trulicity 0.09 mg, I would increase his dose to 1.5 mg weekly.    2. Hyperlipidemia:  -Uncontrolled, LDL was 111 in November 2018. I advised him to continue Pravastatin 40 mg po qhs.   3. Obesity -He is regaining his weight back.  Dietitian consult in progress. Encouraged to continue carbs restriction. I encouraged him to use [employer] facilities for water aerobics as well as stationary bikes.   4. Essential hypertension -His blood pressure is controlled to target.   Continue current meds including ACEI, beta blocker, CCB.  5) vitamin D deficiency:  He is status post therapy with vitamin D 50,000 units weekly for 12 weeks.   Follow up plan: Return in about 4 months (around 03/16/2018) for follow up with pre-visit labs. With meter and logs for f/u.  - Time spent with the patient: 25 min, of which >50% was spent in reviewing his blood glucose logs , discussing his hypo- and hyper-glycemic episodes, reviewing his current and  previous labs and insulin doses and developing a plan to avoid hypo- and hyper-glycemia. Please refer to Patient Instructions for Blood Glucose Monitoring and Insulin/Medications Dosing Guide"  in media tab for additional information.  Glade Lloyd, MD Phone: [phone]  Fax: [phone]   -  This note was partially dictated with voice recognition software. Similar sounding words can be transcribed inadequately or may not  be corrected upon review.  11/16/2017, 10:23 AM

## 2017-11-16 NOTE — Patient Instructions (Signed)

## 2017-11-23 ENCOUNTER — Ambulatory Visit: Payer: PPO | Admitting: "Endocrinology

## 2017-11-27 ENCOUNTER — Other Ambulatory Visit: Payer: Self-pay | Admitting: "Endocrinology

## 2017-12-31 ENCOUNTER — Telehealth: Payer: Self-pay | Admitting: "Endocrinology

## 2017-12-31 NOTE — Telephone Encounter (Signed)
Omar [name] is asking if we any samples of the Trilicity for Omar [name] and if so please call him at  [phone] so he can come pick it up, thanks

## 2017-12-31 NOTE — Telephone Encounter (Signed)
Pt notified to pick up sample.

## 2018-01-03 ENCOUNTER — Other Ambulatory Visit (HOSPITAL_COMMUNITY): Payer: Self-pay

## 2018-01-03 DIAGNOSIS — D6851 Activated protein C resistance: Secondary | ICD-10-CM

## 2018-01-03 DIAGNOSIS — I82492 Acute embolism and thrombosis of other specified deep vein of left lower extremity: Secondary | ICD-10-CM

## 2018-01-04 ENCOUNTER — Other Ambulatory Visit (HOSPITAL_COMMUNITY): Payer: PPO

## 2018-01-11 ENCOUNTER — Ambulatory Visit (HOSPITAL_COMMUNITY): Payer: PPO | Admitting: Internal Medicine

## 2018-03-02 ENCOUNTER — Other Ambulatory Visit: Payer: Self-pay | Admitting: "Endocrinology

## 2018-03-11 DIAGNOSIS — E1159 Type 2 diabetes mellitus with other circulatory complications: Secondary | ICD-10-CM | POA: Diagnosis not present

## 2018-03-12 LAB — VITAMIN B12: VITAMIN B 12: 231 pg/mL (ref 200–1100)

## 2018-03-12 LAB — COMPLETE METABOLIC PANEL WITH GFR
AG Ratio: 1.8 (calc) (ref 1.0–2.5)
ALKALINE PHOSPHATASE (APISO): 68 U/L (ref 40–115)
ALT: 21 U/L (ref 9–46)
AST: 16 U/L (ref 10–35)
Albumin: 4 g/dL (ref 3.6–5.1)
BILIRUBIN TOTAL: 0.4 mg/dL (ref 0.2–1.2)
BUN: 11 mg/dL (ref 7–25)
CHLORIDE: 104 mmol/L (ref 98–110)
CO2: 29 mmol/L (ref 20–32)
Calcium: 9.3 mg/dL (ref 8.6–10.3)
Creat: 0.77 mg/dL (ref 0.70–1.18)
GFR, Est African American: 104 mL/min/{1.73_m2} (ref >=60)
GFR, Est Non African American: 90 mL/min/{1.73_m2} (ref >=60)
GLUCOSE: 133 mg/dL — AB (ref 65–99)
Globulin: 2.2 g/dL (calc) (ref 1.9–3.7)
Potassium: 4.1 mmol/L (ref 3.5–5.3)
Sodium: 140 mmol/L (ref 135–146)
Total Protein: 6.2 g/dL (ref 6.1–8.1)

## 2018-03-12 LAB — HEMOGLOBIN A1C
EAG (MMOL/L): 9 (calc)
HEMOGLOBIN A1C: 7.3 %{Hb} — AB (ref <5.7)
Mean Plasma Glucose: 163 (calc)

## 2018-03-15 ENCOUNTER — Encounter: Payer: Self-pay | Admitting: "Endocrinology

## 2018-03-15 ENCOUNTER — Ambulatory Visit (INDEPENDENT_AMBULATORY_CARE_PROVIDER_SITE_OTHER): Payer: PPO | Admitting: "Endocrinology

## 2018-03-15 VITALS — BP 142/90 | HR 81 | Ht 68.0 in | Wt 275.0 lb

## 2018-03-15 DIAGNOSIS — I10 Essential (primary) hypertension: Secondary | ICD-10-CM

## 2018-03-15 DIAGNOSIS — Z6841 Body Mass Index (BMI) 40.0 and over, adult: Secondary | ICD-10-CM | POA: Diagnosis not present

## 2018-03-15 DIAGNOSIS — E782 Mixed hyperlipidemia: Secondary | ICD-10-CM

## 2018-03-15 DIAGNOSIS — M545 Low back pain: Secondary | ICD-10-CM | POA: Diagnosis not present

## 2018-03-15 DIAGNOSIS — E1159 Type 2 diabetes mellitus with other circulatory complications: Secondary | ICD-10-CM

## 2018-03-15 NOTE — Patient Instructions (Signed)

## 2018-03-15 NOTE — Progress Notes (Signed)
Subjective:    Patient ID: Omar [name], male    DOB: 06/23/1946,    Past Medical History:  Diagnosis Date  . Abnormal EKG    Inferior Q waves  . BPH (benign prostatic hyperplasia)   . Chronic anticoagulation 01/16/2017  . Coronary artery disease   . Degenerative joint disease   . Diabetes mellitus, type 2 (HCC)    No insulin  . DVT (deep venous thrombosis) ([place])   . DVT of leg (deep venous thrombosis) ([place]) 07/02/2015  . Heart murmur   . Hepatic steatosis   . Heterozygous factor V Leiden mutation ([place]) 01/16/2017  . Hyperlipidemia   . 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 Meningioma (HCC)    Left frontal; CVA identified by MRI; no neurologic symptoms  . Neuropathy   . Obesity   . Sleep apnea 2008   Dr. Merlene Laughter interpreted the study-CPAP recommended, but refused by patient  . Stroke [facility])    no deficits from stroke, "Didnt know I had one when they say I did".   Past Surgical History:  Procedure Laterality Date  . CATARACT EXTRACTION W/PHACO Left 04/03/2016   Procedure: CATARACT EXTRACTION PHACO AND INTRAOCULAR LENS PLACEMENT LEFT EYE CDE=21.76;  Surgeon: Williams Che, MD;  Location: [facility];  Service: Ophthalmology;  Laterality: Left;  . CATARACT EXTRACTION W/PHACO Right 06/19/2016   Procedure: CATARACT EXTRACTION PHACO AND INTRAOCULAR LENS PLACEMENT; CDE:  11.56;  Surgeon: Williams Che, MD;  Location: [facility];  Service: Ophthalmology;  Laterality: Right;  . COLONOSCOPY W/ POLYPECTOMY  05/2008   polypectomy x4-adenomatous; internal hemorrhoids  . CORONARY ANGIOPLASTY WITH STENT PLACEMENT  06/04/2012     DES    to [place]    . DUPUYTREN / PALMAR FASCIOTOMY  2009   rt hand-dsc-went home same day x2  . LEFT HEART CATHETERIZATION WITH CORONARY ANGIOGRAM N/A 05/28/2012   Procedure: LEFT HEART CATHETERIZATION WITH CORONARY ANGIOGRAM;  Surgeon: Laverda Page, MD;   Location: [facility] CATH LAB;  Service: Cardiovascular;  Laterality: N/A;  . PERCUTANEOUS CORONARY STENT INTERVENTION (PCI-S) N/A 06/04/2012   Procedure: PERCUTANEOUS CORONARY STENT INTERVENTION (PCI-S);  Surgeon: Laverda Page, MD;  Location: [facility] CATH LAB;  Service: Cardiovascular;  Laterality: N/A;  . TONSILLECTOMY    . VENTRAL HERNIA REPAIR  2010   umb hernia-[facility]-went home same day   Social History   Socioeconomic History  . Marital status: Married    Spouse name: Not on file  . Number of children: Not on file  . Years of education: Not on file  . Highest education level: Not on file  Occupational History  . Occupation: Games developer    Comment: Research officer, trade [employer]  Social Needs  . Financial resource strain: Not on file  . Food insecurity:    Worry: Not on file    Inability: Not on file  . Transportation needs:    Medical: Not on file    Non-medical: Not on file  Tobacco Use  . Smoking status: Never Smoker  . Smokeless tobacco: Never Used  Substance and Sexual Activity  . Alcohol use: No    Alcohol/week: 0.0 oz  . Drug use: No  . Sexual activity: Not Currently    Birth control/protection: None  Lifestyle  . Physical activity:    Days per week: Not on file    Minutes per session: Not on file  .  Stress: Not on file  Relationships  . Social connections:    Talks on phone: Not on file    Gets together: Not on file    Attends religious service: Not on file    Active member of club or organization: Not on file    Attends meetings of clubs or organizations: Not on file    Relationship status: Not on file  Other Topics Concern  . Not on file  Social History Narrative  . Not on file   Outpatient Encounter Medications as of 03/15/2018  Medication Sig  . apixaban (ELIQUIS) 5 MG TABS tablet Take 1 tablet (5 mg total) by mouth 2 (two) times daily. For the first 7 days, take 2 tablets twice a day. Then begin 1 tablet twice a day.  . Dulaglutide (TRULICITY) 1.5 ZO/1.0RU SOPN  Inject 1.5 mg into the skin once a week.  Marland Kitchen eplerenone (INSPRA) 25 MG tablet Take 25 mg by mouth daily.  Marland Kitchen gabapentin (NEURONTIN) 100 MG capsule TAKE (1) CAPSULE BY MOUTH TWICE DAILY.  Marland Kitchen lisinopril-hydrochlorothiazide (PRINZIDE,ZESTORETIC) 20-25 MG per tablet Take 1 tablet by mouth daily.  . metFORMIN (GLUCOPHAGE) 1000 MG tablet TAKE (1) TABLET BY MOUTH TWICE DAILY.  . Multiple Vitamins-Minerals (ONE-A-DAY 50 PLUS PO) Take 1 tablet by mouth daily.  . nebivolol (BYSTOLIC) 10 MG tablet Take 20 mg by mouth daily.  . nitroGLYCERIN (NITROSTAT) 0.4 MG SL tablet Place 0.4 mg under the tongue every 5 (five) minutes as needed for chest pain.  . pravastatin (PRAVACHOL) 40 MG tablet Take 40 mg by mouth daily.  . Tamsulosin HCl (FLOMAX) 0.4 MG CAPS Take 0.4 mg by mouth daily after breakfast.  . torsemide (DEMADEX) 20 MG tablet   . traMADol (ULTRAM) 50 MG tablet Take 1 tablet (50 mg total) by mouth every 6 (six) hours as needed.  . traZODone (DESYREL) 100 MG tablet Take 100 mg by mouth at bedtime.  . verapamil (CALAN-SR) 240 MG CR tablet Take 1 tablet (240 mg total) by mouth at bedtime.   No facility-administered encounter medications on file as of 03/15/2018.    ALLERGIES: No Known Allergies VACCINATION STATUS: Immunization History  Administered Date(s) Administered  . Influenza,inj,Quad PF,6+ Mos 07/17/2017    Diabetes  He presents for his follow-up diabetic visit. He has type 2 diabetes mellitus. Onset time: He was diagnosed at approximate age of 68 years. His disease course has been stable. There are no hypoglycemic associated symptoms. Pertinent negatives for hypoglycemia include no confusion, headaches, pallor or seizures. Pertinent negatives for diabetes include no chest pain, no fatigue, no polydipsia, no polyphagia, no polyuria and no weakness. Symptoms are stable. Diabetic complications include heart disease and PVD. Risk factors for coronary artery disease include diabetes mellitus,  dyslipidemia, obesity, male sex, hypertension and sedentary lifestyle. Current diabetic treatment includes oral agent (monotherapy). He is compliant with treatment some of the time. His weight is increasing steadily. He is following a generally unhealthy diet. He has had a previous visit with a dietitian. He participates in exercise intermittently. An ACE inhibitor/angiotensin II receptor blocker is being taken. Eye exam is current.  Hypertension  This is a chronic problem. The current episode started more than 1 year ago. The problem is uncontrolled. Pertinent negatives include no chest pain, headaches, neck pain, palpitations or shortness of breath. Risk factors for coronary artery disease include diabetes mellitus, dyslipidemia, male gender, obesity and sedentary lifestyle. Past treatments include ACE inhibitors, beta blockers and calcium channel blockers. The current treatment provides no  improvement. Hypertensive end-organ damage includes CAD/MI and PVD.  Hyperlipidemia  This is a chronic problem. The current episode started more than 1 year ago. Exacerbating diseases include diabetes and obesity. Pertinent negatives include no chest pain, myalgias or shortness of breath. Current antihyperlipidemic treatment includes statins. Risk factors for coronary artery disease include dyslipidemia, diabetes mellitus, hypertension, male sex, obesity and a sedentary lifestyle.   He was recently treated for  DVT of LLE .  Review of Systems  Constitutional: Negative for fatigue and unexpected weight change.  HENT: Negative for dental problem, mouth sores and trouble swallowing.   Eyes: Negative for visual disturbance.  Respiratory: Negative for cough, choking, chest tightness, shortness of breath and wheezing.   Cardiovascular: Negative for chest pain, palpitations and leg swelling.  Gastrointestinal: Negative for abdominal distention, abdominal pain, constipation, diarrhea, nausea and vomiting.  Endocrine:  Negative for polydipsia, polyphagia and polyuria.  Genitourinary: Negative for dysuria, flank pain, hematuria and urgency.  Musculoskeletal: Negative for back pain, gait problem, joint swelling, myalgias and neck pain.  Skin: Negative for pallor, rash and wound.  Neurological: Negative for seizures, syncope, weakness, numbness and headaches.  Psychiatric/Behavioral: Negative for confusion, dysphoric mood, hallucinations and suicidal ideas.    Objective:    BP (!) 142/90   Pulse 81   Ht 5\' 8"  (1.727 m)   Wt 275 lb (124.7 kg)   BMI 41.81 kg/m   Wt Readings from Last 3 Encounters:  03/15/18 275 lb (124.7 kg)  11/16/17 273 lb (123.8 kg)  08/09/17 267 lb (121.1 kg)    Physical Exam  Constitutional: He is oriented to person, place, and time. He appears well-developed and well-nourished. He is cooperative.  HENT:  Head: Normocephalic and atraumatic.  Eyes: EOM are normal.  Neck: Normal range of motion. Neck supple. No tracheal deviation present. No thyromegaly present.  Cardiovascular: Normal rate and normal heart sounds. Exam reveals no gallop.  No murmur heard. Pulses:      Dorsalis pedis pulses are 0 on the right side, and 0 on the left side.       Posterior tibial pulses are 0 on the right side, and 0 on the left side.  Pulmonary/Chest: Breath sounds normal. No respiratory distress. He has no wheezes.  Abdominal: Soft. Bowel sounds are normal. He exhibits no distension. There is no hepatosplenomegaly. There is no tenderness. There is no guarding and no CVA tenderness.  Musculoskeletal: He exhibits no edema.       Right shoulder: He exhibits no swelling and no deformity.  Neurological: He is alert and oriented to person, place, and time. He has normal strength and normal reflexes. No cranial nerve deficit or sensory deficit. Gait normal.  Skin: Skin is warm and dry. No rash noted. No cyanosis. Nails show no clubbing.  Psychiatric: He has a normal mood and affect. His speech is  normal and behavior is normal. Thought content normal. Cognition and memory are normal.    Results for orders placed or performed in visit on 11/16/17  Vitamin B12  Result Value Ref Range   Vitamin B-12 231 200 - 1,100 pg/mL  COMPLETE METABOLIC PANEL WITH GFR  Result Value Ref Range   Glucose, Bld 133 (H) 65 - 99 mg/dL   BUN 11 7 - 25 mg/dL   Creat 0.77 0.70 - 1.18 mg/dL   GFR, Est Non African American 90 > OR = 60 mL/min/1.39m2   GFR, Est African American 104 > OR = 60 mL/min/1.51m2   BUN/Creatinine Ratio  NOT APPLICABLE 6 - 22 (calc)   Sodium 140 135 - 146 mmol/L   Potassium 4.1 3.5 - 5.3 mmol/L   Chloride 104 98 - 110 mmol/L   CO2 29 20 - 32 mmol/L   Calcium 9.3 8.6 - 10.3 mg/dL   Total Protein 6.2 6.1 - 8.1 g/dL   Albumin 4.0 3.6 - 5.1 g/dL   Globulin 2.2 1.9 - 3.7 g/dL (calc)   AG Ratio 1.8 1.0 - 2.5 (calc)   Total Bilirubin 0.4 0.2 - 1.2 mg/dL   Alkaline phosphatase (APISO) 68 40 - 115 U/L   AST 16 10 - 35 U/L   ALT 21 9 - 46 U/L  Hemoglobin A1c  Result Value Ref Range   Hgb A1c MFr Bld 7.3 (H) <5.7 % of total Hgb   Mean Plasma Glucose 163 (calc)   eAG (mmol/L) 9.0 (calc)   Diabetic Labs (most recent): Lab Results  Component Value Date   HGBA1C 7.3 (H) 03/11/2018   HGBA1C 7.0 (H) 11/14/2017   HGBA1C 6.8 (H) 08/07/2017   Lipid Panel     Component Value Date/Time   CHOL 196 08/07/2017 0734   TRIG 124 08/07/2017 0734   HDL 62 08/07/2017 0734   CHOLHDL 3.2 08/07/2017 0734   VLDL 28 04/17/2016 0900   LDLCALC 111 (H) 08/07/2017 0734     Assessment & Plan:   1. Type 2 diabetes mellitus with other circulatory complications -He remains at risk for more complications of diabetes. -He came with stable a1c  at 7.3% . - Patient is advised to stick to a routine mealtimes to eat 3 meals  a day and avoid unnecessary snacks ( to snack only to correct hypoglycemia).  -  Suggestion is made for him to avoid simple carbohydrates  from his diet including Cakes, Sweet  Desserts / Pastries, Ice Cream, Soda (diet and regular), Sweet Tea, Candies, Chips, Cookies, Store Bought Juices, Alcohol in Excess of  1-2 drinks a day, Artificial Sweeteners, and "Sugar-free" Products. This will help patient to have stable blood glucose profile and potentially avoid unintended weight gain.  Based on his current progress, he will not need insulin for now.   He is  advised to  Continue  Metformin 1000mg  by mouth twice a day.  - He has tolerated Trulicity.  I advised him to continue Trulicity 1.5 mg subcutaneously weekly.    2. Hyperlipidemia:  -His recent lipid panel showed uncontrolled LDL at 111.  He is advised to continue pravastatin 40 mg p.o. nightly.     3. Obesity -He is regaining his weight back.  Dietitian consult in progress. Encouraged to continue carbs restriction. I encouraged him to use [employer] facilities for water aerobics as well as stationary bikes.   4. Essential hypertension -His blood pressure is not controlled to target.  He has enough blood pressure medications.  He is advised to continue his current medications including lisinopril-hydrochlorothiazide 20 / 25 mg p.o. daily, along with Inspra, verapamil, Demadex.     5) vitamin D deficiency:  -He is status post vitamin D supplement.  His last vitamin D was 24 in February 2019. Follow up plan: Return in about 4 months (around 07/15/2018) for follow up with pre-visit labs. With meter and logs for f/u.  - Time spent with the patient: 25 min, of which >50% was spent in reviewing his  current and  previous labs, previous treatments, and medications doses and developing a plan for long-term care.  Omar [name] participated in  the discussions, expressed understanding, and voiced agreement with the above plans.  All questions were answered to his satisfaction. he is encouraged to contact clinic should he have any questions or concerns prior to his return visit.  Glade Lloyd, MD Phone: [phone]  Fax:  [phone]   -  This note was partially dictated with voice recognition software. Similar sounding words can be transcribed inadequately or may not  be corrected upon review.  03/15/2018, 9:05 AM

## 2018-03-19 ENCOUNTER — Other Ambulatory Visit (HOSPITAL_COMMUNITY): Payer: Self-pay | Admitting: Pulmonary Disease

## 2018-03-19 ENCOUNTER — Ambulatory Visit (HOSPITAL_COMMUNITY): Admission: RE | Admit: 2018-03-19 | Payer: PPO | Source: Ambulatory Visit

## 2018-03-19 DIAGNOSIS — M79604 Pain in right leg: Secondary | ICD-10-CM

## 2018-03-20 ENCOUNTER — Ambulatory Visit (HOSPITAL_COMMUNITY)
Admission: RE | Admit: 2018-03-20 | Discharge: 2018-03-20 | Disposition: A | Discharge status: Home / Self Care | Payer: PPO | Source: Ambulatory Visit | Attending: Pulmonary Disease | Admitting: Pulmonary Disease

## 2018-03-20 DIAGNOSIS — I82511 Chronic embolism and thrombosis of right femoral vein: Secondary | ICD-10-CM | POA: Diagnosis not present

## 2018-03-20 DIAGNOSIS — M79604 Pain in right leg: Secondary | ICD-10-CM | POA: Diagnosis present

## 2018-03-20 DIAGNOSIS — M79661 Pain in right lower leg: Secondary | ICD-10-CM | POA: Diagnosis not present

## 2018-03-26 ENCOUNTER — Encounter: Payer: Self-pay | Admitting: Gastroenterology

## 2018-04-03 ENCOUNTER — Other Ambulatory Visit: Payer: Self-pay | Admitting: "Endocrinology

## 2018-06-14 DIAGNOSIS — L851 Acquired keratosis [keratoderma] palmaris et plantaris: Secondary | ICD-10-CM | POA: Diagnosis not present

## 2018-06-14 DIAGNOSIS — E1142 Type 2 diabetes mellitus with diabetic polyneuropathy: Secondary | ICD-10-CM | POA: Diagnosis not present

## 2018-07-03 ENCOUNTER — Other Ambulatory Visit: Payer: Self-pay | Admitting: "Endocrinology

## 2018-07-19 ENCOUNTER — Ambulatory Visit: Payer: PPO | Admitting: "Endocrinology

## 2018-07-26 DIAGNOSIS — Z23 Encounter for immunization: Secondary | ICD-10-CM | POA: Diagnosis not present

## 2018-08-05 DIAGNOSIS — E1159 Type 2 diabetes mellitus with other circulatory complications: Secondary | ICD-10-CM | POA: Diagnosis not present

## 2018-08-06 LAB — COMPLETE METABOLIC PANEL WITH GFR
AG RATIO: 1.8 (calc) (ref 1.0–2.5)
ALBUMIN MSPROF: 4.2 g/dL (ref 3.6–5.1)
ALT: 22 U/L (ref 9–46)
AST: 21 U/L (ref 10–35)
Alkaline phosphatase (APISO): 71 U/L (ref 40–115)
BUN: 14 mg/dL (ref 7–25)
CALCIUM: 9.4 mg/dL (ref 8.6–10.3)
CO2: 28 mmol/L (ref 20–32)
Chloride: 100 mmol/L (ref 98–110)
Creat: 0.88 mg/dL (ref 0.70–1.18)
GFR, EST AFRICAN AMERICAN: 98 mL/min/{1.73_m2} (ref >=60)
GFR, Est Non African American: 85 mL/min/{1.73_m2} (ref >=60)
GLOBULIN: 2.3 g/dL (ref 1.9–3.7)
Glucose, Bld: 159 mg/dL — ABNORMAL HIGH (ref 65–99)
POTASSIUM: 4 mmol/L (ref 3.5–5.3)
SODIUM: 138 mmol/L (ref 135–146)
TOTAL PROTEIN: 6.5 g/dL (ref 6.1–8.1)
Total Bilirubin: 0.6 mg/dL (ref 0.2–1.2)

## 2018-08-06 LAB — HEMOGLOBIN A1C
HEMOGLOBIN A1C: 7.1 %{Hb} — AB (ref <5.7)
Mean Plasma Glucose: 157 (calc)
eAG (mmol/L): 8.7 (calc)

## 2018-08-08 ENCOUNTER — Encounter: Payer: Self-pay | Admitting: "Endocrinology

## 2018-08-08 ENCOUNTER — Ambulatory Visit (INDEPENDENT_AMBULATORY_CARE_PROVIDER_SITE_OTHER): Payer: PPO | Admitting: "Endocrinology

## 2018-08-08 VITALS — BP 127/83 | HR 73 | Ht 68.0 in | Wt 279.0 lb

## 2018-08-08 DIAGNOSIS — I10 Essential (primary) hypertension: Secondary | ICD-10-CM | POA: Diagnosis not present

## 2018-08-08 DIAGNOSIS — E782 Mixed hyperlipidemia: Secondary | ICD-10-CM | POA: Diagnosis not present

## 2018-08-08 DIAGNOSIS — E1159 Type 2 diabetes mellitus with other circulatory complications: Secondary | ICD-10-CM

## 2018-08-08 DIAGNOSIS — E559 Vitamin D deficiency, unspecified: Secondary | ICD-10-CM

## 2018-08-08 NOTE — Patient Instructions (Signed)

## 2018-08-08 NOTE — Progress Notes (Signed)
Endocrinology follow-up note   Subjective:    Patient ID: Omar [name], male    DOB: 1946-08-26,    Past Medical History:  Diagnosis Date  . Abnormal EKG    Inferior Q waves  . BPH (benign prostatic hyperplasia)   . Chronic anticoagulation 01/16/2017  . Coronary artery disease   . Degenerative joint disease   . Diabetes mellitus, type 2 (HCC)    No insulin  . DVT (deep venous thrombosis) ([place])   . DVT of leg (deep venous thrombosis) ([place]) 07/02/2015  . Heart murmur   . Hepatic steatosis   . Heterozygous factor V Leiden mutation ([place]) 01/16/2017  . Hyperlipidemia   . 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 Meningioma (HCC)    Left frontal; CVA identified by MRI; no neurologic symptoms  . Neuropathy   . Obesity   . Sleep apnea 2008   Dr. Merlene Laughter interpreted the study-CPAP recommended, but refused by patient  . Stroke [facility])    no deficits from stroke, "Didnt know I had one when they say I did".   Past Surgical History:  Procedure Laterality Date  . CATARACT EXTRACTION W/PHACO Left 04/03/2016   Procedure: CATARACT EXTRACTION PHACO AND INTRAOCULAR LENS PLACEMENT LEFT EYE CDE=21.76;  Surgeon: Williams Che, MD;  Location: [facility];  Service: Ophthalmology;  Laterality: Left;  . CATARACT EXTRACTION W/PHACO Right 06/19/2016   Procedure: CATARACT EXTRACTION PHACO AND INTRAOCULAR LENS PLACEMENT; CDE:  11.56;  Surgeon: Williams Che, MD;  Location: [facility];  Service: Ophthalmology;  Laterality: Right;  . COLONOSCOPY W/ POLYPECTOMY  05/2008   polypectomy x4-adenomatous; internal hemorrhoids  . CORONARY ANGIOPLASTY WITH STENT PLACEMENT  06/04/2012     DES    to [place]    . DUPUYTREN / PALMAR FASCIOTOMY  2009   rt hand-dsc-went home same day x2  . LEFT HEART CATHETERIZATION WITH CORONARY ANGIOGRAM N/A 05/28/2012   Procedure: LEFT HEART CATHETERIZATION WITH CORONARY ANGIOGRAM;  Surgeon:  Laverda Page, MD;  Location: [facility] CATH LAB;  Service: Cardiovascular;  Laterality: N/A;  . PERCUTANEOUS CORONARY STENT INTERVENTION (PCI-S) N/A 06/04/2012   Procedure: PERCUTANEOUS CORONARY STENT INTERVENTION (PCI-S);  Surgeon: Laverda Page, MD;  Location: [facility] CATH LAB;  Service: Cardiovascular;  Laterality: N/A;  . TONSILLECTOMY    . VENTRAL HERNIA REPAIR  2010   umb hernia-[facility]-went home same day   Social History   Socioeconomic History  . Marital status: Married    Spouse name: Not on file  . Number of children: Not on file  . Years of education: Not on file  . Highest education level: Not on file  Occupational History  . Occupation: Games developer    Comment: Research officer, trade [employer]  Social Needs  . Financial resource strain: Not on file  . Food insecurity:    Worry: Not on file    Inability: Not on file  . Transportation needs:    Medical: Not on file    Non-medical: Not on file  Tobacco Use  . Smoking status: Never Smoker  . Smokeless tobacco: Never Used  Substance and Sexual Activity  . Alcohol use: No  . Drug use: No  . Sexual activity: Not Currently    Birth control/protection: None  Lifestyle  . Physical activity:    Days per week: Not on file    Minutes per session: Not on file  . Stress:  Not on file  Relationships  . Social connections:    Talks on phone: Not on file    Gets together: Not on file    Attends religious service: Not on file    Active member of club or organization: Not on file    Attends meetings of clubs or organizations: Not on file    Relationship status: Not on file  Other Topics Concern  . Not on file  Social History Narrative  . Not on file   Outpatient Encounter Medications as of 08/08/2018  Medication Sig  . apixaban (ELIQUIS) 5 MG TABS tablet Take 1 tablet (5 mg total) by mouth 2 (two) times daily. For the first 7 days, take 2 tablets twice a day. Then begin 1 tablet twice a day.  . Dulaglutide (TRULICITY) 1.5 BP/1.0CH SOPN  Inject 1.5 mg into the skin once a week.  Marland Kitchen eplerenone (INSPRA) 25 MG tablet Take 25 mg by mouth daily.  Marland Kitchen gabapentin (NEURONTIN) 100 MG capsule TAKE (1) CAPSULE BY MOUTH TWICE DAILY.  Marland Kitchen lisinopril-hydrochlorothiazide (PRINZIDE,ZESTORETIC) 20-25 MG per tablet Take 1 tablet by mouth daily.  . metFORMIN (GLUCOPHAGE) 1000 MG tablet TAKE (1) TABLET BY MOUTH TWICE DAILY.  . Multiple Vitamins-Minerals (ONE-A-DAY 50 PLUS PO) Take 1 tablet by mouth daily.  . nebivolol (BYSTOLIC) 10 MG tablet Take 20 mg by mouth daily.  . nitroGLYCERIN (NITROSTAT) 0.4 MG SL tablet Place 0.4 mg under the tongue every 5 (five) minutes as needed for chest pain.  . pravastatin (PRAVACHOL) 40 MG tablet Take 40 mg by mouth daily.  . Tamsulosin HCl (FLOMAX) 0.4 MG CAPS Take 0.4 mg by mouth daily after breakfast.  . torsemide (DEMADEX) 20 MG tablet   . traMADol (ULTRAM) 50 MG tablet Take 1 tablet (50 mg total) by mouth every 6 (six) hours as needed.  . traZODone (DESYREL) 100 MG tablet Take 100 mg by mouth at bedtime.  . verapamil (CALAN-SR) 240 MG CR tablet Take 1 tablet (240 mg total) by mouth at bedtime.  . [DISCONTINUED] metFORMIN (GLUCOPHAGE) 500 MG tablet TAKE (2) TABLETS BY MOUTH TWICE DAILY.   No facility-administered encounter medications on file as of 08/08/2018.    ALLERGIES: No Known Allergies VACCINATION STATUS: Immunization History  Administered Date(s) Administered  . Influenza,inj,Quad PF,6+ Mos 07/17/2017    Diabetes  He presents for his follow-up diabetic visit. He has type 2 diabetes mellitus. Onset time: He was diagnosed at approximate age of 31 years. His disease course has been improving. There are no hypoglycemic associated symptoms. Pertinent negatives for hypoglycemia include no confusion, headaches, pallor or seizures. Pertinent negatives for diabetes include no chest pain, no fatigue, no polydipsia, no polyphagia, no polyuria and no weakness. Symptoms are improving. Diabetic complications include  heart disease and PVD. Risk factors for coronary artery disease include diabetes mellitus, dyslipidemia, obesity, male sex, hypertension and sedentary lifestyle. Current diabetic treatment includes oral agent (monotherapy). He is compliant with treatment some of the time. His weight is increasing steadily. He is following a generally unhealthy diet. He has had a previous visit with a dietitian. He participates in exercise intermittently. An ACE inhibitor/angiotensin II receptor blocker is being taken. Eye exam is current.  Hypertension  This is a chronic problem. The current episode started more than 1 year ago. The problem is uncontrolled. Pertinent negatives include no chest pain, headaches, neck pain, palpitations or shortness of breath. Risk factors for coronary artery disease include diabetes mellitus, dyslipidemia, male gender, obesity and sedentary lifestyle. Past treatments  include ACE inhibitors, beta blockers and calcium channel blockers. The current treatment provides no improvement. Hypertensive end-organ damage includes CAD/MI and PVD.  Hyperlipidemia  This is a chronic problem. The current episode started more than 1 year ago. Exacerbating diseases include diabetes and obesity. Pertinent negatives include no chest pain, myalgias or shortness of breath. Current antihyperlipidemic treatment includes statins. Risk factors for coronary artery disease include dyslipidemia, diabetes mellitus, hypertension, male sex, obesity and a sedentary lifestyle.   He was recently treated for  DVT of LLE .  Review of Systems  Constitutional: Negative for fatigue and unexpected weight change.  HENT: Negative for dental problem, mouth sores and trouble swallowing.   Eyes: Negative for visual disturbance.  Respiratory: Negative for cough, choking, chest tightness, shortness of breath and wheezing.   Cardiovascular: Negative for chest pain, palpitations and leg swelling.  Gastrointestinal: Negative for  abdominal distention, abdominal pain, constipation, diarrhea, nausea and vomiting.  Endocrine: Negative for polydipsia, polyphagia and polyuria.  Genitourinary: Negative for dysuria, flank pain, hematuria and urgency.  Musculoskeletal: Negative for back pain, gait problem, joint swelling, myalgias and neck pain.  Skin: Negative for pallor, rash and wound.  Neurological: Negative for seizures, syncope, weakness, numbness and headaches.  Psychiatric/Behavioral: Negative for confusion, dysphoric mood, hallucinations and suicidal ideas.    Objective:    BP 127/83   Pulse 73   Ht 5\' 8"  (1.727 m)   Wt 279 lb (126.6 kg)   BMI 42.42 kg/m   Wt Readings from Last 3 Encounters:  08/08/18 279 lb (126.6 kg)  03/15/18 275 lb (124.7 kg)  11/16/17 273 lb (123.8 kg)    Physical Exam  Constitutional: He is oriented to person, place, and time. He appears well-developed and well-nourished. He is cooperative.  HENT:  Head: Normocephalic and atraumatic.  Eyes: EOM are normal.  Neck: Normal range of motion. Neck supple. No tracheal deviation present. No thyromegaly present.  Cardiovascular: Normal rate and normal heart sounds. Exam reveals no gallop.  No murmur heard. Pulses:      Dorsalis pedis pulses are 0 on the right side, and 0 on the left side.       Posterior tibial pulses are 0 on the right side, and 0 on the left side.  Pulmonary/Chest: Effort normal. No respiratory distress. He has no wheezes.  Abdominal: Soft. Bowel sounds are normal. He exhibits no distension. There is no hepatosplenomegaly. There is no tenderness. There is no guarding and no CVA tenderness.  Musculoskeletal: He exhibits no edema.       Right shoulder: He exhibits no swelling and no deformity.  Neurological: He is alert and oriented to person, place, and time. He has normal strength. No cranial nerve deficit or sensory deficit. Gait normal.  Skin: Skin is warm and dry. No rash noted. No cyanosis. Nails show no clubbing.   Psychiatric: He has a normal mood and affect. His speech is normal. Cognition and memory are normal.    Results for orders placed or performed in visit on 03/15/18  COMPLETE METABOLIC PANEL WITH GFR  Result Value Ref Range   Glucose, Bld 159 (H) 65 - 99 mg/dL   BUN 14 7 - 25 mg/dL   Creat 0.88 0.70 - 1.18 mg/dL   GFR, Est Non African American 85 > OR = 60 mL/min/1.1m2   GFR, Est African American 98 > OR = 60 mL/min/1.32m2   BUN/Creatinine Ratio NOT APPLICABLE 6 - 22 (calc)   Sodium 138 135 - 146 mmol/L  Potassium 4.0 3.5 - 5.3 mmol/L   Chloride 100 98 - 110 mmol/L   CO2 28 20 - 32 mmol/L   Calcium 9.4 8.6 - 10.3 mg/dL   Total Protein 6.5 6.1 - 8.1 g/dL   Albumin 4.2 3.6 - 5.1 g/dL   Globulin 2.3 1.9 - 3.7 g/dL (calc)   AG Ratio 1.8 1.0 - 2.5 (calc)   Total Bilirubin 0.6 0.2 - 1.2 mg/dL   Alkaline phosphatase (APISO) 71 40 - 115 U/L   AST 21 10 - 35 U/L   ALT 22 9 - 46 U/L  Hemoglobin A1c  Result Value Ref Range   Hgb A1c MFr Bld 7.1 (H) <5.7 % of total Hgb   Mean Plasma Glucose 157 (calc)   eAG (mmol/L) 8.7 (calc)   Diabetic Labs (most recent): Lab Results  Component Value Date   HGBA1C 7.1 (H) 08/05/2018   HGBA1C 7.3 (H) 03/11/2018   HGBA1C 7.0 (H) 11/14/2017   Lipid Panel     Component Value Date/Time   CHOL 196 08/07/2017 0734   TRIG 124 08/07/2017 0734   HDL 62 08/07/2017 0734   CHOLHDL 3.2 08/07/2017 0734   VLDL 28 04/17/2016 0900   LDLCALC 111 (H) 08/07/2017 0734     Assessment & Plan:   1. Type 2 diabetes mellitus with other circulatory complications -He remains at risk for more complications of diabetes. -He came with stable a1c  at 7.1 % . - Patient is advised to stick to a routine mealtimes to eat 3 meals  a day and avoid unnecessary snacks ( to snack only to correct hypoglycemia).  -He still admits to dietary indiscretions. -  Suggestion is made for him to avoid simple carbohydrates  from his diet including Cakes, Sweet Desserts / Pastries, Ice  Cream, Soda (diet and regular), Sweet Tea, Candies, Chips, Cookies, Store Bought Juices, Alcohol in Excess of  1-2 drinks a day, Artificial Sweeteners, and "Sugar-free" Products. This will help patient to have stable blood glucose profile and potentially avoid unintended weight gain.  Based on his current progress, he will not need insulin for now.   -He is advised to continue metformin 1000 mg p.o. twice daily-after breakfast and after supper.  He has tolerated Trulicity.  He is advised to continue Trulicity 1.5 mg subcutaneously weekly.    2. Hyperlipidemia:  -His recent lipid panel showed uncontrolled LDL at 111.  He is advised to continue pravastatin 40 mg p.o. nightly.     3. Obesity -He is regaining his weight back.  Dietitian consult in progress. Encouraged to continue carbs restriction. I encouraged him to use [employer] facilities for water aerobics as well as stationary bikes.   4. Essential hypertension -His blood pressure is controlled to target.   He has enough blood pressure medications.  He is advised to continue his current medications including lisinopril-hydrochlorothiazide 20 / 25 mg p.o. daily, along with Inspra, verapamil, Demadex.     5) vitamin D deficiency:   He is a status post treatment for vitamin D deficiency.  He will be considered for repeat vitamin D level.    Return in about 6 months (around 02/06/2019) for Follow up with Pre-visit Labs. With meter and logs for f/u.  - Time spent with the patient: 25 min, of which >50% was spent in reviewing his  current and  previous labs, previous treatments, and medications doses and developing a plan for long-term care.  Omar [name] participated in the discussions, expressed understanding, and voiced  agreement with the above plans.  All questions were answered to his satisfaction. he is encouraged to contact clinic should he have any questions or concerns prior to his return visit.  Glade Lloyd, MD Phone: [phone]   Fax: [phone]   -  This note was partially dictated with voice recognition software. Similar sounding words can be transcribed inadequately or may not  be corrected upon review.  08/08/2018, 4:48 PM

## 2018-08-14 DIAGNOSIS — I10 Essential (primary) hypertension: Secondary | ICD-10-CM | POA: Diagnosis not present

## 2018-08-14 DIAGNOSIS — I2510 Atherosclerotic heart disease of native coronary artery without angina pectoris: Secondary | ICD-10-CM | POA: Diagnosis not present

## 2018-08-14 DIAGNOSIS — R0989 Other specified symptoms and signs involving the circulatory and respiratory systems: Secondary | ICD-10-CM | POA: Diagnosis not present

## 2018-08-14 DIAGNOSIS — Z86718 Personal history of other venous thrombosis and embolism: Secondary | ICD-10-CM | POA: Diagnosis not present

## 2018-08-20 DIAGNOSIS — E119 Type 2 diabetes mellitus without complications: Secondary | ICD-10-CM | POA: Diagnosis not present

## 2018-08-20 DIAGNOSIS — I998 Other disorder of circulatory system: Secondary | ICD-10-CM | POA: Diagnosis not present

## 2018-08-23 DIAGNOSIS — E1142 Type 2 diabetes mellitus with diabetic polyneuropathy: Secondary | ICD-10-CM | POA: Diagnosis not present

## 2018-08-23 DIAGNOSIS — L851 Acquired keratosis [keratoderma] palmaris et plantaris: Secondary | ICD-10-CM | POA: Diagnosis not present

## 2018-09-11 ENCOUNTER — Other Ambulatory Visit: Payer: Self-pay | Admitting: Pharmacist

## 2018-09-11 NOTE — Patient Outreach (Signed)
[place] [facility]) Care Management  09/11/2018  Omar [name] 1946/01/30 [id number]   Outreach call to [name] regarding his request for follow up from the [facility] Medication Adherence Campaign, which contacted him regarding adherence to his lisinopril-hydrochlorothiazide and pravastatin. Leave a HIPAA compliant message on the patient's voicemail.  Harlow Asa, PharmD, [place] [facility] [phone]

## 2018-10-08 DIAGNOSIS — H903 Sensorineural hearing loss, bilateral: Secondary | ICD-10-CM | POA: Diagnosis not present

## 2018-10-25 ENCOUNTER — Other Ambulatory Visit: Payer: Self-pay | Admitting: Pharmacist

## 2018-10-25 NOTE — Patient Outreach (Signed)
[place] [facility]) Care Management  10/25/2018  LESLIE LANGILLE 1946-04-18 [id number]  Referral Reason: Medication Management  Referral Source: [employer]  Patient failed statin, ACEi/ARB, and diabetes adherence measures in 2019. Contacted patient to determine if any pharmacy intervention would benefit medication adherence for 2020. Left HIPAA compliant message for patient to return my call at his convenience.   Catie Darnelle Maffucci, PharmD PGY2 Ambulatory Care Pharmacy Resident, [facility] Phone: [phone]

## 2018-10-29 ENCOUNTER — Other Ambulatory Visit: Payer: Self-pay | Admitting: "Endocrinology

## 2018-11-06 ENCOUNTER — Ambulatory Visit: Payer: PPO | Admitting: Orthopaedic Surgery

## 2018-11-06 ENCOUNTER — Encounter: Payer: Self-pay | Admitting: Orthopaedic Surgery

## 2018-11-06 ENCOUNTER — Ambulatory Visit (INDEPENDENT_AMBULATORY_CARE_PROVIDER_SITE_OTHER): Payer: PPO

## 2018-11-06 VITALS — BP 124/73 | HR 68 | Ht 68.0 in | Wt 286.0 lb

## 2018-11-06 DIAGNOSIS — M25551 Pain in right hip: Secondary | ICD-10-CM

## 2018-11-06 DIAGNOSIS — I10 Essential (primary) hypertension: Secondary | ICD-10-CM

## 2018-11-06 DIAGNOSIS — Z6841 Body Mass Index (BMI) 40.0 and over, adult: Secondary | ICD-10-CM | POA: Diagnosis not present

## 2018-11-06 DIAGNOSIS — E1159 Type 2 diabetes mellitus with other circulatory complications: Secondary | ICD-10-CM | POA: Diagnosis not present

## 2018-11-06 DIAGNOSIS — Z7901 Long term (current) use of anticoagulants: Secondary | ICD-10-CM | POA: Diagnosis not present

## 2018-11-06 NOTE — Progress Notes (Signed)
Subjective:    Patient ID: Omar [name], male    DOB: 03/11/1946, 73 y.o.   MRN: [id number]  HPI He has right hip and groin pain that is getting worse over the last few months.  He has no trauma.  He has pain when first standing in the right hip area and after walking a distance.  He can hardly walk some days.  He has noticed he cannot move his right hip very well.  He has no redness, no weakness.  He uses a cane at times but did not bring it in today.    He has been on anti-coagulation secondary to a clot in the femoral vein. It is stable.  He has type 2 diabetes and hypertension.  Dr. Dorris Fetch is his doctor for diabetes.  Dr. Luan Pulling is his family doctor.   Review of Systems  Constitutional: Positive for activity change.  Musculoskeletal: Positive for arthralgias and gait problem.  All other systems reviewed and are negative.  For Review of Systems, all other systems reviewed and are negative.  The following is a summary of the past history medically, past history surgically, known current medicines, social history and family history.  This information is gathered electronically by the computer from prior information and documentation.  I review this each visit and have found including this information at this point in the chart is beneficial and informative.   Past Medical History:  Diagnosis Date  . Abnormal EKG    Inferior Q waves  . BPH (benign prostatic hyperplasia)   . Chronic anticoagulation 01/16/2017  . Coronary artery disease   . Degenerative joint disease   . Diabetes mellitus, type 2 (HCC)    No insulin  . DVT (deep venous thrombosis) ([place])   . DVT of leg (deep venous thrombosis) ([place]) 07/02/2015  . Heart murmur   . Hepatic steatosis   . Heterozygous factor V Leiden mutation ([place]) 01/16/2017  . Hyperlipidemia   . 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 Meningioma (HCC)    Left frontal; CVA  identified by MRI; no neurologic symptoms  . Neuropathy   . Obesity   . Sleep apnea 2008   Dr. Merlene Laughter interpreted the study-CPAP recommended, but refused by patient  . Stroke [facility])    no deficits from stroke, "Didnt know I had one when they say I did".    Past Surgical History:  Procedure Laterality Date  . CATARACT EXTRACTION W/PHACO Left 04/03/2016   Procedure: CATARACT EXTRACTION PHACO AND INTRAOCULAR LENS PLACEMENT LEFT EYE CDE=21.76;  Surgeon: Williams Che, MD;  Location: [facility];  Service: Ophthalmology;  Laterality: Left;  . CATARACT EXTRACTION W/PHACO Right 06/19/2016   Procedure: CATARACT EXTRACTION PHACO AND INTRAOCULAR LENS PLACEMENT; CDE:  11.56;  Surgeon: Williams Che, MD;  Location: [facility];  Service: Ophthalmology;  Laterality: Right;  . COLONOSCOPY W/ POLYPECTOMY  05/2008   polypectomy x4-adenomatous; internal hemorrhoids  . CORONARY ANGIOPLASTY WITH STENT PLACEMENT  06/04/2012     DES    to [place]    . DUPUYTREN / PALMAR FASCIOTOMY  2009   rt hand-dsc-went home same day x2  . LEFT HEART CATHETERIZATION WITH CORONARY ANGIOGRAM N/A 05/28/2012   Procedure: LEFT HEART CATHETERIZATION WITH CORONARY ANGIOGRAM;  Surgeon: Laverda Page, MD;  Location: [facility] CATH LAB;  Service: Cardiovascular;  Laterality: N/A;  . PERCUTANEOUS CORONARY STENT INTERVENTION (PCI-S) N/A  06/04/2012   Procedure: PERCUTANEOUS CORONARY STENT INTERVENTION (PCI-S);  Surgeon: Laverda Page, MD;  Location: [facility] CATH LAB;  Service: Cardiovascular;  Laterality: N/A;  . TONSILLECTOMY    . VENTRAL HERNIA REPAIR  2010   umb hernia-[facility]-went home same day    Current Outpatient Medications on File Prior to Visit  Medication Sig Dispense Refill  . apixaban (ELIQUIS) 5 MG TABS tablet Take 1 tablet (5 mg total) by mouth 2 (two) times daily. For the first 7 days, take 2 tablets twice a day. Then begin 1 tablet twice a day. 90 tablet 0  . eplerenone (INSPRA) 25 MG tablet Take 25 mg by mouth  daily.    Marland Kitchen gabapentin (NEURONTIN) 100 MG capsule TAKE (1) CAPSULE BY MOUTH TWICE DAILY. 60 capsule 3  . lisinopril-hydrochlorothiazide (PRINZIDE,ZESTORETIC) 20-25 MG per tablet Take 1 tablet by mouth daily.    . metFORMIN (GLUCOPHAGE) 1000 MG tablet TAKE (1) TABLET BY MOUTH TWICE DAILY. 60 tablet 3  . Multiple Vitamins-Minerals (ONE-A-DAY 50 PLUS PO) Take 1 tablet by mouth daily.    . nebivolol (BYSTOLIC) 10 MG tablet Take 20 mg by mouth daily.    . nitroGLYCERIN (NITROSTAT) 0.4 MG SL tablet Place 0.4 mg under the tongue every 5 (five) minutes as needed for chest pain.    . pravastatin (PRAVACHOL) 40 MG tablet Take 40 mg by mouth daily.    . Tamsulosin HCl (FLOMAX) 0.4 MG CAPS Take 0.4 mg by mouth daily after breakfast.    . torsemide (DEMADEX) 20 MG tablet     . traMADol (ULTRAM) 50 MG tablet Take 1 tablet (50 mg total) by mouth every 6 (six) hours as needed. 15 tablet 0  . traZODone (DESYREL) 100 MG tablet Take 100 mg by mouth at bedtime.    . TRULICITY 1.5 ZH/2.9JM SOPN INJECT 1.5MG  INTO THE SKIN ONCE A WEEK. 2 mL 2  . verapamil (CALAN-SR) 240 MG CR tablet Take 1 tablet (240 mg total) by mouth at bedtime. 90 tablet 0   No current facility-administered medications on file prior to visit.     Social History   Socioeconomic History  . Marital status: Married    Spouse name: Not on file  . Number of children: Not on file  . Years of education: Not on file  . Highest education level: Not on file  Occupational History  . Occupation: Games developer    Comment: Research officer, trade union  Social Needs  . Financial resource strain: Not on file  . Food insecurity:    Worry: Not on file    Inability: Not on file  . Transportation needs:    Medical: Not on file    Non-medical: Not on file  Tobacco Use  . Smoking status: Never Smoker  . Smokeless tobacco: Never Used  Substance and Sexual Activity  . Alcohol use: No  . Drug use: No  . Sexual activity: Not Currently    Birth  control/protection: None  Lifestyle  . Physical activity:    Days per week: Not on file    Minutes per session: Not on file  . Stress: Not on file  Relationships  . Social connections:    Talks on phone: Not on file    Gets together: Not on file    Attends religious service: Not on file    Active member of club or organization: Not on file    Attends meetings of clubs or organizations: Not on file    Relationship status: Not on  file  . Intimate partner violence:    Fear of current or ex partner: Not on file    Emotionally abused: Not on file    Physically abused: Not on file    Forced sexual activity: Not on file  Other Topics Concern  . Not on file  Social History Narrative  . Not on file    Family History  Problem Relation Age of Onset  . Cancer Mother   . Cancer Father   . Coronary artery disease Neg Hx     BP 124/73   Pulse 68   Ht 5\' 8"  (1.727 m)   Wt 286 lb (129.7 kg)   BMI 43.49 kg/m   Body mass index is 43.49 kg/m.  The patient meets the AMA guidelines for Morbid (severe) obesity with a BMI > 40.0 and I have recommended weight loss.  I talked to him and his wife about his wife and gave suggestions on how to begin a plan to lose weight.  Cutting out sweet tea is a start.      Objective:   Physical Exam Constitutional:      Appearance: He is well-developed.  HENT:     Head: Normocephalic and atraumatic.  Eyes:     Conjunctiva/sclera: Conjunctivae normal.     Pupils: Pupils are equal, round, and reactive to light.  Neck:     Musculoskeletal: Normal range of motion and neck supple.  Cardiovascular:     Rate and Rhythm: Normal rate and regular rhythm.  Pulmonary:     Effort: Pulmonary effort is normal.  Abdominal:     Palpations: Abdomen is soft.  Musculoskeletal:     Right hip: He exhibits decreased range of motion, tenderness and bony tenderness.       Legs:  Skin:    General: Skin is warm and dry.  Neurological:     Mental Status: He is  alert and oriented to person, place, and time.     Cranial Nerves: No cranial nerve deficit.     Motor: No abnormal muscle tone.     Coordination: Coordination normal.     Deep Tendon Reflexes: Reflexes are normal and symmetric. Reflexes normal.  Psychiatric:        Behavior: Behavior normal.        Thought Content: Thought content normal.        Judgment: Judgment normal.     X-rays of the pelvis and right hip were done, reported separately.  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      Assessment & Plan:   Encounter Diagnoses  Name Primary?  . Right hip pain Yes  . Body mass index 40.0-44.9, adult ([place])   . Morbid obesity ([place])   . Current use of long term anticoagulation   . Type 2 diabetes mellitus with vascular disease ([place])   . Essential hypertension    He has significant degenerative changes of the right hip involving both the acetabulum and femoral head with loss of joint space and cyst formation.  He would benefit from a total hip replacement if he could have one.  He would need to get medical clearance for this.  He is to see Dr. Luan Pulling on 11-20-2018.  He needs to begin weight reduction.  He cannot take any NSAIDs secondary to being on Eliquis.  He can see Dr. Aline Brochure after getting medical clearance.  Call if any problem.  Precautions discussed.   Electronically Signed Sanjuana Kava, MD 2/5/202011:02 AM

## 2018-11-08 ENCOUNTER — Other Ambulatory Visit: Payer: Self-pay | Admitting: Pharmacist

## 2018-11-08 NOTE — Patient Outreach (Signed)
[name] Care Management  11/08/2018  Omar [name] 09-21-1946 [id number]  Referral Reason: Medication Management  Referral Source: [employer]  Patient failed statin, ACEi/ARB, and diabetes adherence measures in 2019. Contacted patient to determine if any pharmacy intervention would benefit medication adherence for 2020.   Unsuccessful outreach attempt #2. Left HIPAA compliant message for patient to return my call at his convenience.   Catie Darnelle Maffucci, PharmD, [place] PGY2 Ambulatory Care Pharmacy Resident, [place] [facility] Phone: [phone]

## 2018-11-13 ENCOUNTER — Ambulatory Visit: Payer: PPO | Admitting: Orthopedic Surgery

## 2018-11-13 ENCOUNTER — Other Ambulatory Visit: Payer: Self-pay | Admitting: Pharmacist

## 2018-11-13 NOTE — Patient Outreach (Signed)
[facility]) Care Management  11/13/2018  Omar [name] Sep 03, 1946 [id number]  Patient failed statin, ACEi/ARB, and diabetes adherence measures in 2019. Contacted patient to determine if any pharmacy intervention would benefit medication adherence for 2020. Unsuccessful outreach attempt #3 - left HIPAA compliant message for patient to return my call.   Will close case d/t lack of engagement.   Catie Darnelle Maffucci, PharmD, [place] PGY2 Ambulatory Care Pharmacy Resident, [place] [facility] Phone: [phone]

## 2018-11-20 DIAGNOSIS — E785 Hyperlipidemia, unspecified: Secondary | ICD-10-CM | POA: Diagnosis not present

## 2018-11-20 DIAGNOSIS — I10 Essential (primary) hypertension: Secondary | ICD-10-CM | POA: Diagnosis not present

## 2018-11-20 DIAGNOSIS — Z23 Encounter for immunization: Secondary | ICD-10-CM | POA: Diagnosis not present

## 2018-11-20 DIAGNOSIS — E1165 Type 2 diabetes mellitus with hyperglycemia: Secondary | ICD-10-CM | POA: Diagnosis not present

## 2018-11-20 DIAGNOSIS — R600 Localized edema: Secondary | ICD-10-CM | POA: Diagnosis not present

## 2018-11-20 DIAGNOSIS — Z0000 Encounter for general adult medical examination without abnormal findings: Secondary | ICD-10-CM | POA: Diagnosis not present

## 2018-11-25 DIAGNOSIS — M1611 Unilateral primary osteoarthritis, right hip: Secondary | ICD-10-CM | POA: Diagnosis not present

## 2018-11-27 ENCOUNTER — Ambulatory Visit: Payer: PPO | Admitting: Orthopedic Surgery

## 2018-12-06 DIAGNOSIS — M1611 Unilateral primary osteoarthritis, right hip: Secondary | ICD-10-CM | POA: Diagnosis not present

## 2018-12-09 ENCOUNTER — Other Ambulatory Visit: Payer: Self-pay | Admitting: Cardiology

## 2018-12-09 ENCOUNTER — Other Ambulatory Visit: Payer: Self-pay | Admitting: "Endocrinology

## 2018-12-21 DIAGNOSIS — Z1212 Encounter for screening for malignant neoplasm of rectum: Secondary | ICD-10-CM | POA: Diagnosis not present

## 2018-12-21 DIAGNOSIS — Z1211 Encounter for screening for malignant neoplasm of colon: Secondary | ICD-10-CM | POA: Diagnosis not present

## 2018-12-24 LAB — COLOGUARD: Cologuard: POSITIVE — AB

## 2019-01-07 ENCOUNTER — Telehealth: Payer: Self-pay | Admitting: Gastroenterology

## 2019-01-07 NOTE — Telephone Encounter (Signed)
We received a referral from Dr Luan Pulling for positive cologuard. I called to offer a facetime/zoom OV. Pt's wife is wanting him to have another test to see if there's still blood in his stool. I told her that Dr Luan Pulling had already checked for that and that's why he sent [place] the referral, but she is wanting another one done. Please advise. She said that [facility] had done his colonoscopy before, but the referral is marked as a new patient. [phone]

## 2019-01-07 NOTE — Telephone Encounter (Signed)
PLEASE CALL PT. He doesn't need another COLOGUARD. HIS LAST TCS WAS IN 2009 AND HE HAD POLYPS REMOVED. He does need a TCS but due to COVID restrictions he will need to wait until after JUN 1. He does need a OFC VISIT: FACE TIME, OR ZOOM. I WOULD NOT RECOMMEND AN PHYSICAL APPT DUE TO HIS KNOWN HISTORY OF HTN, CAD, DIABETES, AND BMI > 30.

## 2019-01-07 NOTE — Telephone Encounter (Signed)
Spoke with pt's spouse. She doesn't want to wait until June 1 because she doesn't want there to be a problem. She understands the covid restrictions but pt she says the risk of him bleeding outweighs it. She would prefer that someone takes a look at him.

## 2019-01-07 NOTE — Telephone Encounter (Signed)
PLEASE CALL PT. HE CAN HAVE AN OFC VISIT PRIOR TO JUN 1 BUT THERE IS A SYSTEM WIDE RESTRICTION ON ENDOSCOPY UNTIL AFTER JUN 1.

## 2019-01-07 NOTE — Telephone Encounter (Signed)
SF please advise.

## 2019-01-08 ENCOUNTER — Encounter: Payer: Self-pay | Admitting: Gastroenterology

## 2019-01-08 NOTE — Telephone Encounter (Signed)
Noted. Spouse is aware of apt and understand the restrictions on Endoscopy.

## 2019-01-08 NOTE — Telephone Encounter (Signed)
OV made and letter mailed °

## 2019-01-23 ENCOUNTER — Ambulatory Visit (INDEPENDENT_AMBULATORY_CARE_PROVIDER_SITE_OTHER): Payer: PPO | Admitting: Gastroenterology

## 2019-01-23 ENCOUNTER — Encounter: Payer: Self-pay | Admitting: Gastroenterology

## 2019-01-23 ENCOUNTER — Other Ambulatory Visit: Payer: Self-pay | Admitting: *Deleted

## 2019-01-23 ENCOUNTER — Encounter: Payer: Self-pay | Admitting: *Deleted

## 2019-01-23 ENCOUNTER — Other Ambulatory Visit: Payer: Self-pay

## 2019-01-23 DIAGNOSIS — R195 Other fecal abnormalities: Secondary | ICD-10-CM

## 2019-01-23 MED ORDER — NA SULFATE-K SULFATE-MG SULF 17.5-3.13-1.6 GM/177ML PO SOLN: 1.0000 | ORAL | 0 refills | Status: DC

## 2019-01-23 NOTE — Progress Notes (Signed)
Subjective:    Patient ID: Omar [name], male    DOB: July 27, 1946, 73 y.o.   MRN: [id number]  Sinda Du, MD   HPI Last TCS AUG 2009: 4 simple adenomas removed. HAD COLOGUARD POS APR 2020. HAS SLEEP APNEA: NO MACHINE BECAUSE COULDN'T SLEEP. INTENTIONAL WEIGHT LOSS. APPETITE: NOT TOO BAD. BMs: EVERY 1-2 DAYS, STRAINING SOMETIME DEPENDING ON WHAT HE EATS. NEVER EGD. RECENTLY HAD SHOT IN R HIP.  PT DENIES FEVER, CHILLS, HEMATOCHEZIA, HEMATEMESIS, nausea, vomiting, melena, diarrhea, CHEST PAIN, SHORTNESS OF BREATH, CHANGE IN BOWEL IN HABITS, constipation, abdominal pain, problems swallowing, problems with sedation, OR heartburn or indigestion.  Past Medical History:  Diagnosis Date  . Abnormal EKG    Inferior Q waves  . BPH (benign prostatic hyperplasia)   . Chronic anticoagulation 01/16/2017  . Coronary artery disease   . Degenerative joint disease   . Diabetes mellitus, type 2 (HCC)    No insulin  . DVT (deep venous thrombosis) ([place])   . DVT of leg (deep venous thrombosis) ([place]) 07/02/2015  . Heart murmur   . Hepatic steatosis   . Heterozygous factor V Leiden mutation ([place]) 01/16/2017  . Hyperlipidemia   . 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 Meningioma (HCC)    Left frontal; CVA identified by MRI; no neurologic symptoms  . Neuropathy   . Obesity   . Sleep apnea 2008   Dr. Merlene Laughter interpreted the study-CPAP recommended, but refused by patient  . Stroke [facility])    no deficits from stroke, "Didnt know I had one when they say I did".   Past Surgical History:  Procedure Laterality Date  . CATARACT EXTRACTION W/PHACO Left 04/03/2016   Procedure: CATARACT EXTRACTION PHACO AND INTRAOCULAR LENS PLACEMENT LEFT EYE CDE=21.76;  Surgeon: Williams Che, MD;  Location: [facility];  Service: Ophthalmology;  Laterality: Left;  . CATARACT EXTRACTION W/PHACO Right 06/19/2016   Procedure: CATARACT EXTRACTION PHACO AND  INTRAOCULAR LENS PLACEMENT; CDE:  11.56;  Surgeon: Williams Che, MD;  Location: [facility];  Service: Ophthalmology;  Laterality: Right;  . COLONOSCOPY W/ POLYPECTOMY  05/2008   polypectomy x4-adenomatous; internal hemorrhoids  . CORONARY ANGIOPLASTY WITH STENT PLACEMENT  06/04/2012     DES    to [place]    . DUPUYTREN / PALMAR FASCIOTOMY  2009   rt hand-dsc-went home same day x2  . LEFT HEART CATHETERIZATION WITH CORONARY ANGIOGRAM N/A 05/28/2012   Procedure: LEFT HEART CATHETERIZATION WITH CORONARY ANGIOGRAM;  Surgeon: Laverda Page, MD;  Location: [facility] CATH LAB;  Service: Cardiovascular;  Laterality: N/A;  . PERCUTANEOUS CORONARY STENT INTERVENTION (PCI-S) N/A 06/04/2012   Procedure: PERCUTANEOUS CORONARY STENT INTERVENTION (PCI-S);  Surgeon: Laverda Page, MD;  Location: [facility] CATH LAB;  Service: Cardiovascular;  Laterality: N/A;  . TONSILLECTOMY    . VENTRAL HERNIA REPAIR  2010   umb hernia-[facility]-went home same day    No Known Allergies  Current Outpatient Medications  Medication Sig    . apixaban (ELIQUIS) 5 MG TABS tablet Take 1 tablet (5 mg total) by mouth 2 (two) times daily. For the first 7 days, take 2 tablets twice a day. Then begin 1 tablet twice a day.    Marland Kitchen BYSTOLIC 20 MG TABS TAKE (1) TABLET BY MOUTH ONCE DAILY.    Marland Kitchen eplerenone (INSPRA) 25 MG tablet Take 25 mg by mouth daily.    Marland Kitchen gabapentin (NEURONTIN)  100 MG capsule TAKE (1) CAPSULE BY MOUTH TWICE DAILY.    Marland Kitchen lisinopril-hydrochlorothiazide (PRINZIDE,ZESTORETIC) 20-25 MG per tablet Take 1 tablet by mouth daily.    . metFORMIN (GLUCOPHAGE) 1000 MG tablet TAKE (1) TABLET BY MOUTH TWICE DAILY.    . metFORMIN (GLUCOPHAGE) 500 MG tablet TAKE (2) TABLETS BY MOUTH TWICE DAILY.    . Multiple Vitamins-Minerals (ONE-A-DAY 50 PLUS PO) Take 1 tablet by mouth daily.    . nebivolol (BYSTOLIC) 10 MG tablet Take 20 mg by mouth daily.    . nitroGLYCERIN (NITROSTAT) 0.4 MG SL tablet Place 0.4 mg under the tongue every 5  (five) minutes as needed for chest pain.    . pravastatin (PRAVACHOL) 40 MG tablet Take 40 mg by mouth daily.    . Tamsulosin HCl (FLOMAX) 0.4 MG CAPS Take 0.4 mg by mouth daily after breakfast.    . torsemide (DEMADEX) 20 MG tablet as needed.     . traMADol (ULTRAM) 50 MG tablet Take 1 tablet (50 mg total) by mouth every 6 (six) hours as needed.    . traZODone (DESYREL) 100 MG tablet Take 100 mg by mouth at bedtime.    . TRULICITY 1.5 SL/3.7DS SOPN INJECT 1.5MG  INTO THE SKIN ONCE A WEEK.    . verapamil (CALAN-SR) 240 MG CR tablet Take 1 tablet (240 mg total) by mouth at bedtime.      Review of Systems PER HPI OTHERWISE ALL SYSTEMS ARE NEGATIVE.    Objective:   Physical Exam Vitals signs reviewed.  Constitutional:      General: He is not in acute distress.    Appearance: He is well-developed.  HENT:     Head: Normocephalic and atraumatic.     Mouth/Throat:     Pharynx: No oropharyngeal exudate.  Eyes:     General: No scleral icterus.    Pupils: Pupils are equal, round, and reactive to light.  Neck:     Musculoskeletal: Normal range of motion and neck supple.  Cardiovascular:     Rate and Rhythm: Normal rate and regular rhythm.     Heart sounds: Normal heart sounds.  Pulmonary:     Effort: Pulmonary effort is normal. No respiratory distress.     Breath sounds: Normal breath sounds.  Abdominal:     General: Bowel sounds are normal. There is no distension.     Palpations: Abdomen is soft.     Tenderness: There is no abdominal tenderness.  Lymphadenopathy:     Cervical: No cervical adenopathy.  Neurological:     Mental Status: He is alert and oriented to person, place, and time.       Assessment & Plan:

## 2019-01-23 NOTE — Patient Instructions (Addendum)
DRINK WATER TO KEEP YOUR URINE LIGHT YELLOW.  FOLLOW A HIGH FIBER DIET. AVOID ITEMS THAT CAUSE BLOATING & GAS. SEE INFO BELOW.   COMPLETE COLONOSCOPY FOR THE POSITIVE COLOGUARD AND UPPER ENDOSCOPY TO SCREEN FOR BARRETT'S ESOPHAGUS AFTER MAY 15. YOU MAY BRING THE ENEMA TO ADMINISTER IN THE PREOP AREA. WE WILL USE CPAP DURING YOUR ENDOSCOPY.    HOLD ELIQUIS FOR 2 DAYS PRIOR TO ENDOSCOPY.   HOLD TRULICITY ON DAY OF ENDOSCOPY.   CONTINUE GLUCOPHAGE ON DAY BEFORE AND DAY OF ENDOSCOPY.  FOLLOW UP IN THE OFFICE WILL BE SCHEDULED IF NEEDED AFTER ENDOSCOPY.

## 2019-01-23 NOTE — Assessment & Plan Note (Signed)
IN PATIENT WITH PRIOR HISTORY OF SIMPLE ADENOMAS. NO BRBPR OR MELENA.  DRINK WATER TO KEEP YOUR URINE LIGHT YELLOW. FOLLOW A HIGH FIBER DIET. AVOID ITEMS THAT CAUSE BLOATING & GAS.  HANDOUT GIVEN. COMPLETE COLONOSCOPY FOR THE POSITIVE COLOGUARD AND UPPER ENDOSCOPY TO SCREEN FOR BARRETT'S ESOPHAGUS AFTER MAY 15. BRING THE ENEMA TO ADMINISTER IN THE PREOP AREA. WE WILL USE CPAP DURING YOUR ENDOSCOPY. DISCUSSED PROCEDURE, BENEFITS, & RISKS: < 1% chance of medication reaction, bleeding, perforation, ASPIRATION, or rupture of spleen/liver requiring surgery to fix it and missed polyps < 1 cm 10-20% of the time.    HOLD ELIQUIS FOR 2 DAYS PRIOR TO ENDOSCOPY. RISK OF EMBOLIC EVENT DISCUSSED.   HOLD TRULICITY ON DAY OF ENDOSCOPY.   CONTINUE GLUCOPHAGE ON DAY BEFORE AND DAY OF ENDOSCOPY.  FOLLOW UP IN THE OFFICE WILL BE SCHEDULED IF NEEDED AFTER ENDOSCOPY.

## 2019-01-24 NOTE — Progress Notes (Signed)
cc'd to pcp 

## 2019-01-27 DIAGNOSIS — E782 Mixed hyperlipidemia: Secondary | ICD-10-CM | POA: Diagnosis not present

## 2019-01-27 DIAGNOSIS — E1159 Type 2 diabetes mellitus with other circulatory complications: Secondary | ICD-10-CM | POA: Diagnosis not present

## 2019-01-27 DIAGNOSIS — M1611 Unilateral primary osteoarthritis, right hip: Secondary | ICD-10-CM | POA: Diagnosis not present

## 2019-01-28 LAB — MICROALBUMIN / CREATININE URINE RATIO
Creatinine, Urine: 118 mg/dL (ref 20–320)
MICROALB UR: 0.4 mg/dL
MICROALB/CREAT RATIO: 3 ug/mg{creat} (ref <30)

## 2019-01-28 LAB — LIPID PANEL
CHOL/HDL RATIO: 3.4 (calc) (ref <5.0)
Cholesterol: 165 mg/dL (ref <200)
HDL: 49 mg/dL (ref >=40)
LDL CHOLESTEROL (CALC): 88 mg/dL
NON-HDL CHOLESTEROL (CALC): 116 mg/dL (ref <130)
Triglycerides: 185 mg/dL — ABNORMAL HIGH (ref <150)

## 2019-01-28 LAB — COMPLETE METABOLIC PANEL WITH GFR
AG Ratio: 2 (calc) (ref 1.0–2.5)
ALT: 16 U/L (ref 9–46)
AST: 16 U/L (ref 10–35)
Albumin: 4.1 g/dL (ref 3.6–5.1)
Alkaline phosphatase (APISO): 48 U/L (ref 35–144)
BUN: 17 mg/dL (ref 7–25)
CO2: 27 mmol/L (ref 20–32)
Calcium: 10 mg/dL (ref 8.6–10.3)
Chloride: 105 mmol/L (ref 98–110)
Creat: 0.93 mg/dL (ref 0.70–1.18)
GFR, EST AFRICAN AMERICAN: 93 mL/min/{1.73_m2} (ref >=60)
GFR, EST NON AFRICAN AMERICAN: 81 mL/min/{1.73_m2} (ref >=60)
Globulin: 2.1 g/dL (calc) (ref 1.9–3.7)
Glucose, Bld: 154 mg/dL — ABNORMAL HIGH (ref 65–99)
Potassium: 4.2 mmol/L (ref 3.5–5.3)
SODIUM: 141 mmol/L (ref 135–146)
TOTAL PROTEIN: 6.2 g/dL (ref 6.1–8.1)
Total Bilirubin: 0.4 mg/dL (ref 0.2–1.2)

## 2019-01-28 LAB — TSH: TSH: 2.84 m[IU]/L (ref 0.40–4.50)

## 2019-01-28 LAB — T4, FREE: FREE T4: 1.1 ng/dL (ref 0.8–1.8)

## 2019-01-28 LAB — VITAMIN D 25 HYDROXY (VIT D DEFICIENCY, FRACTURES): Vit D, 25-Hydroxy: 29 ng/mL — ABNORMAL LOW (ref 30–100)

## 2019-01-28 LAB — HEMOGLOBIN A1C
EAG (MMOL/L): 8.7 (calc)
Hgb A1c MFr Bld: 7.1 % of total Hgb — ABNORMAL HIGH (ref <5.7)
Mean Plasma Glucose: 157 (calc)

## 2019-01-31 ENCOUNTER — Encounter: Payer: Self-pay | Admitting: "Endocrinology

## 2019-01-31 ENCOUNTER — Other Ambulatory Visit: Payer: Self-pay

## 2019-01-31 ENCOUNTER — Ambulatory Visit (INDEPENDENT_AMBULATORY_CARE_PROVIDER_SITE_OTHER): Payer: PPO | Admitting: "Endocrinology

## 2019-01-31 DIAGNOSIS — E782 Mixed hyperlipidemia: Secondary | ICD-10-CM | POA: Diagnosis not present

## 2019-01-31 DIAGNOSIS — E559 Vitamin D deficiency, unspecified: Secondary | ICD-10-CM | POA: Diagnosis not present

## 2019-01-31 DIAGNOSIS — E1159 Type 2 diabetes mellitus with other circulatory complications: Secondary | ICD-10-CM

## 2019-01-31 DIAGNOSIS — I10 Essential (primary) hypertension: Secondary | ICD-10-CM

## 2019-01-31 NOTE — Progress Notes (Signed)
01/31/2019                                                     Endocrinology Telehealth Visit Follow up Note -During COVID -19 Pandemic  This visit type was conducted due to national recommendations for restrictions regarding the COVID-19 Pandemic  in an effort to limit this patient's exposure and mitigate transmission of the corona virus.  Due to his co-morbid illnesses, Omar [name] is at  moderate to high risk for complications without adequate follow up.  This format is felt to be most appropriate for him at this time.  I connected with this patient on 01/31/2019  Via Webex . Omar [name], 04/06/1946. he has verbally consented to this visit. All issues noted in this document were discussed and addressed. The format was not optimal for physical exam.   Subjective:    Patient ID: Omar [name], male    DOB: Feb 19, 1946,    Past Medical History:  Diagnosis Date  . Abnormal EKG    Inferior Q waves  . BPH (benign prostatic hyperplasia)   . Chronic anticoagulation 01/16/2017  . Coronary artery disease   . Degenerative joint disease   . Diabetes mellitus, type 2 (HCC)    No insulin  . DVT (deep venous thrombosis) ([place])   . DVT of leg (deep venous thrombosis) ([place]) 07/02/2015  . Heart murmur   . Hepatic steatosis   . Heterozygous factor V Leiden mutation ([place]) 01/16/2017  . Hyperlipidemia   . 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 Meningioma (HCC)    Left frontal; CVA identified by MRI; no neurologic symptoms  . Neuropathy   . Obesity   . Sleep apnea 2008   Dr. Merlene Laughter interpreted the study-CPAP recommended, but refused by patient  . Stroke [facility])    no deficits from stroke, "Didnt know I had one when they say I did".   Past Surgical History:  Procedure Laterality Date  . CATARACT EXTRACTION W/PHACO Left 04/03/2016   Procedure: CATARACT EXTRACTION PHACO AND INTRAOCULAR LENS PLACEMENT LEFT EYE CDE=21.76;   Surgeon: Williams Che, MD;  Location: [facility];  Service: Ophthalmology;  Laterality: Left;  . CATARACT EXTRACTION W/PHACO Right 06/19/2016   Procedure: CATARACT EXTRACTION PHACO AND INTRAOCULAR LENS PLACEMENT; CDE:  11.56;  Surgeon: Williams Che, MD;  Location: [facility];  Service: Ophthalmology;  Laterality: Right;  . COLONOSCOPY W/ POLYPECTOMY  05/2008   polypectomy x4-adenomatous; internal hemorrhoids  . CORONARY ANGIOPLASTY WITH STENT PLACEMENT  06/04/2012     DES    to [place]    . DUPUYTREN / PALMAR FASCIOTOMY  2009   rt hand-dsc-went home same day x2  . LEFT HEART CATHETERIZATION WITH CORONARY ANGIOGRAM N/A 05/28/2012   Procedure: LEFT HEART CATHETERIZATION WITH CORONARY ANGIOGRAM;  Surgeon: Laverda Page, MD;  Location: [facility] CATH LAB;  Service: Cardiovascular;  Laterality: N/A;  . PERCUTANEOUS CORONARY STENT INTERVENTION (PCI-S) N/A 06/04/2012   Procedure: PERCUTANEOUS CORONARY STENT INTERVENTION (PCI-S);  Surgeon: Laverda Page, MD;  Location: [facility] CATH LAB;  Service: Cardiovascular;  Laterality: N/A;  . TONSILLECTOMY    . VENTRAL HERNIA REPAIR  2010   umb hernia-[facility]-went home same day   Social History   Socioeconomic  History  . Marital status: Married    Spouse name: Not on file  . Number of children: Not on file  . Years of education: Not on file  . Highest education level: Not on file  Occupational History  . Occupation: Games developer    Comment: Research officer, trade [employer]  Social Needs  . Financial resource strain: Not on file  . Food insecurity:    Worry: Not on file    Inability: Not on file  . Transportation needs:    Medical: Not on file    Non-medical: Not on file  Tobacco Use  . Smoking status: Never Smoker  . Smokeless tobacco: Never Used  Substance and Sexual Activity  . Alcohol use: No  . Drug use: No  . Sexual activity: Not Currently    Birth control/protection: None  Lifestyle  . Physical activity:    Days per week: Not on  file    Minutes per session: Not on file  . Stress: Not on file  Relationships  . Social connections:    Talks on phone: Not on file    Gets together: Not on file    Attends religious service: Not on file    Active member of club or organization: Not on file    Attends meetings of clubs or organizations: Not on file    Relationship status: Not on file  Other Topics Concern  . Not on file  Social History Narrative  . Not on file   Outpatient Encounter Medications as of 01/31/2019  Medication Sig  . apixaban (ELIQUIS) 5 MG TABS tablet Take 1 tablet (5 mg total) by mouth 2 (two) times daily. For the first 7 days, take 2 tablets twice a day. Then begin 1 tablet twice a day.  Marland Kitchen BYSTOLIC 20 MG TABS TAKE (1) TABLET BY MOUTH ONCE DAILY.  Marland Kitchen eplerenone (INSPRA) 25 MG tablet Take 25 mg by mouth daily.  Marland Kitchen gabapentin (NEURONTIN) 100 MG capsule TAKE (1) CAPSULE BY MOUTH TWICE DAILY.  Marland Kitchen lisinopril-hydrochlorothiazide (PRINZIDE,ZESTORETIC) 20-25 MG per tablet Take 1 tablet by mouth daily.  . metFORMIN (GLUCOPHAGE) 1000 MG tablet TAKE (1) TABLET BY MOUTH TWICE DAILY.  . metFORMIN (GLUCOPHAGE) 500 MG tablet TAKE (2) TABLETS BY MOUTH TWICE DAILY.  . Multiple Vitamins-Minerals (ONE-A-DAY 50 PLUS PO) Take 1 tablet by mouth daily.  . Na Sulfate-K Sulfate-Mg Sulf 17.5-3.13-1.6 GM/177ML SOLN Take 1 kit by mouth as directed.  . nebivolol (BYSTOLIC) 10 MG tablet Take 20 mg by mouth daily.  . nitroGLYCERIN (NITROSTAT) 0.4 MG SL tablet Place 0.4 mg under the tongue every 5 (five) minutes as needed for chest pain.  . pravastatin (PRAVACHOL) 40 MG tablet Take 40 mg by mouth daily.  . Tamsulosin HCl (FLOMAX) 0.4 MG CAPS Take 0.4 mg by mouth daily after breakfast.  . torsemide (DEMADEX) 20 MG tablet as needed.   . traMADol (ULTRAM) 50 MG tablet Take 1 tablet (50 mg total) by mouth every 6 (six) hours as needed.  . traZODone (DESYREL) 100 MG tablet Take 100 mg by mouth at bedtime.  . TRULICITY 1.5 GQ/6.7YP SOPN  INJECT 1.5MG INTO THE SKIN ONCE A WEEK.  . verapamil (CALAN-SR) 240 MG CR tablet Take 1 tablet (240 mg total) by mouth at bedtime.   No facility-administered encounter medications on file as of 01/31/2019.    ALLERGIES: No Known Allergies VACCINATION STATUS: Immunization History  Administered Date(s) Administered  . Influenza,inj,Quad PF,6+ Mos 07/17/2017    Diabetes  He presents for his follow-up  diabetic visit. He has type 2 diabetes mellitus. Onset time: He was diagnosed at approximate age of 89 years. His disease course has been stable. There are no hypoglycemic associated symptoms. Pertinent negatives for hypoglycemia include no confusion, headaches, pallor or seizures. Pertinent negatives for diabetes include no chest pain, no fatigue, no polydipsia, no polyphagia, no polyuria and no weakness. Symptoms are stable. Diabetic complications include heart disease and PVD. Risk factors for coronary artery disease include diabetes mellitus, dyslipidemia, obesity, male sex, hypertension and sedentary lifestyle. Current diabetic treatment includes oral agent (monotherapy). He is compliant with treatment some of the time. His weight is decreasing steadily. He is following a generally unhealthy diet. He has had a previous visit with a dietitian. He participates in exercise intermittently. An ACE inhibitor/angiotensin II receptor blocker is being taken. Eye exam is current.  Hypertension  This is a chronic problem. The current episode started more than 1 year ago. The problem is uncontrolled. Pertinent negatives include no chest pain, headaches, neck pain, palpitations or shortness of breath. Risk factors for coronary artery disease include diabetes mellitus, dyslipidemia, male gender, obesity and sedentary lifestyle. Past treatments include ACE inhibitors, beta blockers and calcium channel blockers. The current treatment provides no improvement. Hypertensive end-organ damage includes CAD/MI and PVD.   Hyperlipidemia  This is a chronic problem. The current episode started more than 1 year ago. Exacerbating diseases include diabetes and obesity. Pertinent negatives include no chest pain, myalgias or shortness of breath. Current antihyperlipidemic treatment includes statins. Risk factors for coronary artery disease include dyslipidemia, diabetes mellitus, hypertension, male sex, obesity and a sedentary lifestyle.    Review of systems: Limited as above   Objective:    There were no vitals taken for this visit.  Wt Readings from Last 3 Encounters:  01/23/19 264 lb 6.4 oz (119.9 kg)  11/06/18 286 lb (129.7 kg)  08/08/18 279 lb (126.6 kg)      Results for orders placed or performed in visit on 08/08/18  Hemoglobin A1c  Result Value Ref Range   Hgb A1c MFr Bld 7.1 (H) <5.7 % of total Hgb   Mean Plasma Glucose 157 (calc)   eAG (mmol/L) 8.7 (calc)  COMPLETE METABOLIC PANEL WITH GFR  Result Value Ref Range   Glucose, Bld 154 (H) 65 - 99 mg/dL   BUN 17 7 - 25 mg/dL   Creat 0.93 0.70 - 1.18 mg/dL   GFR, Est Non African American 81 > OR = 60 mL/min/1.27m   GFR, Est African American 93 > OR = 60 mL/min/1.711m  BUN/Creatinine Ratio NOT APPLICABLE 6 - 22 (calc)   Sodium 141 135 - 146 mmol/L   Potassium 4.2 3.5 - 5.3 mmol/L   Chloride 105 98 - 110 mmol/L   CO2 27 20 - 32 mmol/L   Calcium 10.0 8.6 - 10.3 mg/dL   Total Protein 6.2 6.1 - 8.1 g/dL   Albumin 4.1 3.6 - 5.1 g/dL   Globulin 2.1 1.9 - 3.7 g/dL (calc)   AG Ratio 2.0 1.0 - 2.5 (calc)   Total Bilirubin 0.4 0.2 - 1.2 mg/dL   Alkaline phosphatase (APISO) 48 35 - 144 U/L   AST 16 10 - 35 U/L   ALT 16 9 - 46 U/L  Lipid panel  Result Value Ref Range   Cholesterol 165 <200 mg/dL   HDL 49 > OR = 40 mg/dL   Triglycerides 185 (H) <150 mg/dL   LDL Cholesterol (Calc) 88 mg/dL (calc)   Total CHOL/HDL Ratio  3.4 <5.0 (calc)   Non-HDL Cholesterol (Calc) 116 <130 mg/dL (calc)  VITAMIN D 25 Hydroxy (Vit-D Deficiency, Fractures)  Result  Value Ref Range   Vit D, 25-Hydroxy 29 (L) 30 - 100 ng/mL  TSH  Result Value Ref Range   TSH 2.84 0.40 - 4.50 mIU/L  T4, free  Result Value Ref Range   Free T4 1.1 0.8 - 1.8 ng/dL  Microalbumin / creatinine urine ratio  Result Value Ref Range   Creatinine, Urine 118 20 - 320 mg/dL   Microalb, Ur 0.4 mg/dL   Microalb Creat Ratio 3 <30 mcg/mg creat   Diabetic Labs (most recent): Lab Results  Component Value Date   HGBA1C 7.1 (H) 01/27/2019   HGBA1C 7.1 (H) 08/05/2018   HGBA1C 7.3 (H) 03/11/2018   Lipid Panel     Component Value Date/Time   CHOL 165 01/27/2019 0853   TRIG 185 (H) 01/27/2019 0853   HDL 49 01/27/2019 0853   CHOLHDL 3.4 01/27/2019 0853   VLDL 28 04/17/2016 0900   LDLCALC 88 01/27/2019 0853    Assessment & Plan:   1. Type 2 diabetes mellitus with other circulatory complications -He remains at risk for more complications of diabetes. -His previsit labs show A1c stable at 7.1%.   - Patient is advised to stick to a routine mealtimes to eat 3 meals  a day and avoid unnecessary snacks ( to snack only to correct hypoglycemia).   -  Suggestion is made for him to avoid simple carbohydrates  from his diet including Cakes, Sweet Desserts / Pastries, Ice Cream, Soda (diet and regular), Sweet Tea, Candies, Chips, Cookies, Store Bought Juices, Alcohol in Excess of  1-2 drinks a day, Artificial Sweeteners, and "Sugar-free" Products. This will help patient to have stable blood glucose profile and potentially avoid unintended weight gain.   Based on his current progress, he will not need insulin treatment for now.    -He is advised to continue metformin 1000 mg p.o. twice daily-after breakfast and after supper.    -  He has tolerated Trulicity.  He is advised to continue Trulicity 1.5 mg subcutaneously weekly.    2. Hyperlipidemia:  -His recent lipid panel showed uncontrolled LDL at 111.  He is advised to continue pravastatin 40 mg p.o. nightly.     3. Obesity -He is  regaining his weight back.  Dietitian consult in progress. Encouraged to continue carbs restriction. I encouraged him to use [employer] facilities for water aerobics as well as stationary bikes.   4. Essential hypertension -he is advised to home monitor blood pressure and report if > 140/90 on 2 separate readings.  He has enough blood pressure medications.  He is advised to continue his current medications including lisinopril-hydrochlorothiazide 20 / 25 mg p.o. daily, along with Inspra, verapamil, Demadex.     5) vitamin D deficiency:   He is a status post treatment for vitamin D deficiency.  His show his vitamin D replete now.     - Patient Care Time Today:  25 min, of which >50% was spent in reviewing his  current and  previous labs/studies, previous treatments, and medications doses and developing a plan for long-term care based on the latest recommendations for standards of care.  Omar [name] participated in the discussions, expressed understanding, and voiced agreement with the above plans.  All questions were answered to his satisfaction. he is encouraged to contact clinic should he have any questions or concerns prior to his return visit.  Glade Lloyd, MD Phone: [phone]  Fax: [phone]   -  This note was partially dictated with voice recognition software. Similar sounding words can be transcribed inadequately or may not  be corrected upon review.  01/31/2019, 9:33 AM

## 2019-02-02 ENCOUNTER — Emergency Department (HOSPITAL_COMMUNITY): Payer: PPO

## 2019-02-02 ENCOUNTER — Other Ambulatory Visit: Payer: Self-pay

## 2019-02-02 ENCOUNTER — Emergency Department (HOSPITAL_COMMUNITY)
Admission: EM | Admit: 2019-02-02 | Discharge: 2019-02-02 | Disposition: A | Discharge status: Home / Self Care | Payer: PPO | Attending: Emergency Medicine | Admitting: Emergency Medicine

## 2019-02-02 ENCOUNTER — Encounter (HOSPITAL_COMMUNITY): Payer: Self-pay | Admitting: Emergency Medicine

## 2019-02-02 DIAGNOSIS — N201 Calculus of ureter: Secondary | ICD-10-CM | POA: Diagnosis not present

## 2019-02-02 DIAGNOSIS — E119 Type 2 diabetes mellitus without complications: Secondary | ICD-10-CM | POA: Insufficient documentation

## 2019-02-02 DIAGNOSIS — I10 Essential (primary) hypertension: Secondary | ICD-10-CM | POA: Insufficient documentation

## 2019-02-02 DIAGNOSIS — Z8673 Personal history of transient ischemic attack (TIA), and cerebral infarction without residual deficits: Secondary | ICD-10-CM | POA: Diagnosis not present

## 2019-02-02 DIAGNOSIS — N202 Calculus of kidney with calculus of ureter: Secondary | ICD-10-CM | POA: Diagnosis not present

## 2019-02-02 DIAGNOSIS — I2510 Atherosclerotic heart disease of native coronary artery without angina pectoris: Secondary | ICD-10-CM | POA: Diagnosis not present

## 2019-02-02 DIAGNOSIS — M545 Low back pain, unspecified: Secondary | ICD-10-CM

## 2019-02-02 DIAGNOSIS — D3502 Benign neoplasm of left adrenal gland: Secondary | ICD-10-CM | POA: Diagnosis not present

## 2019-02-02 LAB — URINALYSIS, ROUTINE W REFLEX MICROSCOPIC
Bilirubin Urine: NEGATIVE
Glucose, UA: NEGATIVE mg/dL
Hgb urine dipstick: NEGATIVE
Ketones, ur: NEGATIVE mg/dL
Leukocytes,Ua: NEGATIVE
Nitrite: NEGATIVE
Protein, ur: NEGATIVE mg/dL
Specific Gravity, Urine: 1.013 (ref 1.005–1.030)
pH: 5 (ref 5.0–8.0)

## 2019-02-02 MED ORDER — OXYCODONE-ACETAMINOPHEN 5-325 MG PO TABS: 2.0000 | ORAL_TABLET | Freq: Four times a day (QID) | ORAL | 0 refills | Status: DC | PRN

## 2019-02-02 MED ORDER — TAMSULOSIN HCL 0.4 MG PO CAPS: 0.4000 mg | ORAL_CAPSULE | Freq: Every day | ORAL | 0 refills | Status: AC

## 2019-02-02 NOTE — ED Triage Notes (Signed)
Pt complains of back pain for the last 3 days   Denies difficulty urinating "when I go I don't go that much"  Describes back pain is spasms

## 2019-02-02 NOTE — ED Notes (Signed)
Pt has been to CT   Is awaiting results

## 2019-02-02 NOTE — ED Provider Notes (Signed)
Emergency Department Provider Note   I have reviewed the triage vital signs and the nursing notes.   HISTORY  Chief Complaint Back Pain   HPI Omar [name] is a 73 y.o. male with PMH of DM, HLD, HTN, DVT, and OSA presents to the emergency department for evaluation of back pain over the past 3 days.  He reports intermittent urine hesitancy but denies frequency or dysuria.  Pain is diffuse and located across the entire lower back.  He describes a spasm-like quality to the pain at times.  No injury.  Denies any numbness or weakness in the legs.  He has chronic pain in the right hip and states he is due for hip replacement soon.  Does receive cortisone injections in the right hip.  Denies any fevers or chills.  No abdominal discomfort.  Denies history of kidney stone or UTI.  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 Merlene Laughter interpreted the study-CPAP recommended, but refused by patient   Stroke [facility])    no deficits from stroke, "Didnt know I had one when they say I did".    Patient Active Problem List   Diagnosis Date Noted   Positive colorectal cancer screening using Cologuard test 01/23/2019   Morbid obesity ([place]) 11/16/2017   Heterozygous factor V Leiden mutation ([place]) 01/16/2017   Chronic  anticoagulation 01/16/2017   Vitamin D deficiency 01/21/2016   DVT of leg (deep venous thrombosis) ([place]) 07/02/2015   CAD (coronary artery disease), native coronary artery 06/05/2012   Postsurgical percutaneous transluminal coronary angioplasty (PTCA) status 06/05/2012   Essential hypertension    Type 2 diabetes mellitus with vascular disease ([place])    Sleep apnea    Meningioma (HCC)    Mixed hyperlipidemia    Class 3 obesity due to excess calories with serious comorbidity and body mass index (BMI) of 40.0 to 44.9 in adult    Hepatic steatosis    Abnormal EKG     Past Surgical History:  Procedure Laterality Date   CATARACT EXTRACTION W/PHACO Left 04/03/2016   Procedure: CATARACT EXTRACTION PHACO AND INTRAOCULAR LENS PLACEMENT LEFT EYE CDE=21.76;  Surgeon: Williams Che, MD;  Location: [facility];  Service: Ophthalmology;  Laterality: Left;   CATARACT EXTRACTION W/PHACO Right 06/19/2016   Procedure: CATARACT EXTRACTION PHACO AND INTRAOCULAR LENS PLACEMENT; CDE:  11.56;  Surgeon: Williams Che, MD;  Location: [facility];  Service: Ophthalmology;  Laterality: Right;   COLONOSCOPY W/ POLYPECTOMY  05/2008   polypectomy x4-adenomatous; internal hemorrhoids   CORONARY ANGIOPLASTY WITH STENT PLACEMENT  06/04/2012     DES    to [place] / [place] FASCIOTOMY  2009  rt hand-dsc-went home same day x2   LEFT HEART CATHETERIZATION WITH CORONARY ANGIOGRAM N/A 05/28/2012   Procedure: LEFT HEART CATHETERIZATION WITH CORONARY ANGIOGRAM;  Surgeon: Laverda Page, MD;  Location: [facility] CATH LAB;  Service: Cardiovascular;  Laterality: N/A;   PERCUTANEOUS CORONARY STENT INTERVENTION (PCI-S) N/A 06/04/2012   Procedure: PERCUTANEOUS CORONARY STENT INTERVENTION (PCI-S);  Surgeon: Laverda Page, MD;  Location: [facility] CATH LAB;  Service: Cardiovascular;  Laterality: N/A;   TONSILLECTOMY     VENTRAL HERNIA REPAIR  2010   umb hernia-[facility]-went home same day     Allergies Patient has no known allergies.  Family History  Problem Relation Age of Onset   Cancer Mother    Cancer Father    Coronary artery disease Neg Hx     Social History Social History   Tobacco Use   Smoking status: Never Smoker   Smokeless tobacco: Never Used  Substance Use Topics   Alcohol use: No   Drug use: No    Review of Systems  Constitutional: No fever/chills Eyes: No visual changes. ENT: No sore throat. Cardiovascular: Denies chest pain. Respiratory: Denies shortness of breath. Gastrointestinal: No abdominal pain.  No nausea, no vomiting.  No diarrhea.  No constipation. Genitourinary: Negative for dysuria. Positive urine hesitancy.  Musculoskeletal: Positive for back pain. Skin: Negative for rash. Neurological: Negative for headaches, focal weakness or numbness.  10-point ROS otherwise negative.  ____________________________________________   PHYSICAL EXAM:  VITAL SIGNS: ED Triage Vitals  Enc Vitals Group     BP 02/02/19 1220 (!) 143/96     Pulse Rate 02/02/19 1220 72     Resp 02/02/19 1220 16     Temp 02/02/19 1220 97.7 F (36.5 C)     Temp Source 02/02/19 1220 Oral     SpO2 02/02/19 1220 97 %     Weight 02/02/19 1222 262 lb 5.6 oz (119 kg)     Height 02/02/19 1222 5\' 8"  (1.727 m)     Pain Score 02/02/19 1222 8   Constitutional: Alert and oriented. Well appearing and in no acute distress. Eyes: Conjunctivae are normal.  Head: Atraumatic. Nose: No congestion/rhinnorhea. Mouth/Throat: Mucous membranes are moist.  Neck: No stridor.   Cardiovascular: Normal rate, regular rhythm. Good peripheral circulation. Grossly normal heart sounds.   Respiratory: Normal respiratory effort.  No retractions. Lungs CTAB. Gastrointestinal: Soft and nontender. No distention. No CVA tenderness.  Musculoskeletal: No lower extremity tenderness nor edema. No gross deformities of extremities. No midline thoracic or lumbar spine tenderness.   Neurologic:  Normal speech and language. No gross focal neurologic deficits are appreciated.  Skin:  Skin is warm, dry and intact. No rash noted.  ____________________________________________   LABS (all labs ordered are listed, but only abnormal results are displayed)  Labs Reviewed  URINALYSIS, ROUTINE W REFLEX MICROSCOPIC - Abnormal; Notable for the following components:      Result Value   APPearance HAZY (*)    All other components within normal limits  URINE CULTURE   ____________________________________________  RADIOLOGY  Ct Renal Stone Study  Result Date: 02/02/2019 CLINICAL DATA:  Low back pain for 3 days, flank pain, suspected stone disease, difficulty urinating, history of BPH, coronary artery disease, type II diabetes mellitus, hypertension, stroke EXAM: CT ABDOMEN AND PELVIS WITHOUT CONTRAST TECHNIQUE: Multidetector CT imaging of the abdomen and pelvis was performed following the standard protocol without IV contrast. Sagittal and coronal MPR images reconstructed from axial data set. Oral contrast not administered for this indication. COMPARISON:  01/09/2017 FINDINGS: Lower chest: Lung bases clear Hepatobiliary: Calcified 15 mm gallstone in gallbladder. Gallbladder and liver otherwise normal appearance Pancreas: Normal appearance Spleen: Normal appearance Adrenals/Urinary Tract: RIGHT adrenal gland normal appearance. Low-attenuation LEFT adrenal mass 2.6 x 2.2 cm consistent with adenoma. Nonobstructing 6 mm mid RIGHT renal calculus. Cortical thinning BILATERAL kidneys. No additional renal mass lesion or hydronephrosis. Normal appearing ureters. 4 mm diameter calcification identified within the urinary bladder at RIGHT ureterovesical junction consistent with calculus. Question additional tiny calculus dependently in LEFT bladder. Stomach/Bowel: Normal appendix. Stomach and bowel loops normal appearance Vascular/Lymphatic: Atherosclerotic calcifications aorta and iliac arteries  without aneurysm. Coronary arterial calcifications noted. No adenopathy. Reproductive: Minimal prostatic enlargement Other: Hazy infiltration of mesentery consistent with fibrosing mesenteritis, unchanged. No free air or free fluid. No hernia. Musculoskeletal: Advanced osteoarthritic changes of the RIGHT hip joint. Bones demineralized. Scattered degenerative disc disease changes of the thoracolumbar spine with multifactorial AP narrowing of spinal canal at L2-L3. 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 4 mm calculus at RIGHT ureterovesical junction without hydronephrosis or hydroureter. Additional nonobstructing 6 mm RIGHT renal calculus. Minimal prostatic enlargement. Multifactorial spinal stenosis L2-L3. Fibrosing mesenteritis. Electronically Signed   By: Lavonia Dana M.D.   On: 02/02/2019 14:11    ____________________________________________   PROCEDURES  Procedure(s) performed:   Procedures  None  ____________________________________________   INITIAL IMPRESSION / ASSESSMENT AND PLAN / ED COURSE  Pertinent labs & imaging results that were available during my care of the patient were reviewed by me and considered in my medical decision making (see chart for details).   Patient presents to the emergency department with lower back pain and urine hesitancy.  No dysuria, fever, frequency.  No specific flank pain.  No tenderness to palpation of the abdomen.  Plan for CT renal along with urine analysis. Likely MSK etiology but will screen for ureterolithiasis. Doubt vascular pathology for pain given pain location and quality. No findings to suspect acute spine emergency.  I reviewed the [place] drug database prior to prescribing this medication.  Tramadol is listed but no other opiate medications.   02:35 PM  CT renal shows 4 mm stone at the UVJ without hydro-.  This correlates with the patient's symptoms.  No evidence of infection on UA.  Pain is well controlled here.  Discussed the  CT findings with the patient and need for urology follow-up as an outpatient.  Provided contact information for him to call tomorrow and set up an appointment for the next 1 to 2 weeks.  Would expect that based on size the stone should pass without intervention.  Discussed ED return precautions.  Patient does take tramadol for pain along with Eliquis.  Because of the anticoagulation he is not a candidate for NSAIDs at home.  Will continue Flomax.  Also provided prescription for Percocet.  Advised not to take this with tramadol and patient verbalizes understanding of this instruction. ____________________________________________  FINAL CLINICAL IMPRESSION(S) / ED DIAGNOSES  Final diagnoses:  Ureterolithiasis  Acute right-sided low back pain without sciatica     NEW OUTPATIENT MEDICATIONS STARTED DURING THIS VISIT:  New Prescriptions   OXYCODONE-ACETAMINOPHEN (PERCOCET/ROXICET) 5-325 MG TABLET    Take 2 tablets by mouth every 6 (six) hours as needed for severe pain.   TAMSULOSIN (FLOMAX) 0.4 MG CAPS CAPSULE    Take 1 capsule (0.4 mg total) by mouth daily for 14 days.    Note:  This document was prepared using Systems analyst and may  include unintentional dictation errors.  Nanda Quinton, MD Emergency Medicine    Salimatou Simone, [facility], MD 02/02/19 [phone]

## 2019-02-02 NOTE — Discharge Instructions (Signed)
You have been seen in the Emergency Department (ED) today for pain that we believe based on your workup, is caused by kidney stones.  As we have discussed, please drink plenty of fluids.  Please make a follow up appointment with the physician(s) listed elsewhere in this documentation.  You may take pain medication as needed but ONLY as prescribed.  Please also take your prescribed Flomax daily.  We also recommend that you take over-the-counter ibuprofen regularly according to label instructions over the next 5 days.  Take it with meals to minimize stomach discomfort.  Please see your doctor as soon as possible as stones may take 1-3 weeks to pass and you may require additional care or medications.  Do not drink alcohol, drive or participate in any other potentially dangerous activities while taking opiate pain medication as it may make you sleepy. Do not take this medication with any other sedating medications, either prescription or over-the-counter. If you were prescribed Percocet or Vicodin, do not take these with acetaminophen (Tylenol) as it is already contained within these medications.   Take Percocet as needed for severe pain.  This medication is an opiate (or narcotic) pain medication and can be habit forming.  Use it as little as possible to achieve adequate pain control.  Do not take this medication along with Tramadol. Do not use or use it with extreme caution if you have a history of opiate abuse or dependence.  If you are on a pain contract with your primary care doctor or a pain specialist, be sure to let them know you were prescribed this medication today from the Emergency Department.  This medication is intended for your use only - do not give any to anyone else and keep it in a secure place where nobody else, especially children, have access to it.  It will also cause or worsen constipation, so you may want to consider taking an over-the-counter stool softener while you are taking this  medication.  Return to the Emergency Department (ED) or call your doctor if you have any worsening pain, fever, painful urination, are unable to urinate, or develop other symptoms that concern you.    Kidney Stones Kidney stones (urolithiasis) are deposits that form inside your kidneys. The intense pain is caused by the stone moving through the urinary tract. When the stone moves, the ureter goes into spasm around the stone. The stone is usually passed in the urine.  CAUSES  A disorder that makes certain neck glands produce too much parathyroid hormone (primary hyperparathyroidism). A buildup of uric acid crystals, similar to gout in your joints. Narrowing (stricture) of the ureter. A kidney obstruction present at birth (congenital obstruction). Previous surgery on the kidney or ureters. Numerous kidney infections. SYMPTOMS  Feeling sick to your stomach (nauseous). Throwing up (vomiting). Blood in the urine (hematuria). Pain that usually spreads (radiates) to the groin. Frequency or urgency of urination. DIAGNOSIS  Taking a history and physical exam. Blood or urine tests. CT scan. Occasionally, an examination of the inside of the urinary bladder (cystoscopy) is performed. TREATMENT  Observation. Increasing your fluid intake. Extracorporeal shock wave lithotripsy--This is a noninvasive procedure that uses shock waves to break up kidney stones. Surgery may be needed if you have severe pain or persistent obstruction. There are various surgical procedures. Most of the procedures are performed with the use of small instruments. Only small incisions are needed to accommodate these instruments, so recovery time is minimized. The size, location, and chemical composition are  all important variables that will determine the proper choice of action for you. Talk to your health care provider to better understand your situation so that you will minimize the risk of injury to yourself and your  kidney.  HOME CARE INSTRUCTIONS  Drink enough water and fluids to keep your urine clear or pale yellow. This will help you to pass the stone or stone fragments. Strain all urine through the provided strainer. Keep all particulate matter and stones for your health care provider to see. The stone causing the pain may be as small as a grain of salt. It is very important to use the strainer each and every time you pass your urine. The collection of your stone will allow your health care provider to analyze it and verify that a stone has actually passed. The stone analysis will often identify what you can do to reduce the incidence of recurrences. Only take over-the-counter or prescription medicines for pain, discomfort, or fever as directed by your health care provider. Keep all follow-up visits as told by your health care provider. This is important. Get follow-up X-rays if required. The absence of pain does not always mean that the stone has passed. It may have only stopped moving. If the urine remains completely obstructed, it can cause loss of kidney function or even complete destruction of the kidney. It is your responsibility to make sure X-rays and follow-ups are completed. Ultrasounds of the kidney can show blockages and the status of the kidney. Ultrasounds are not associated with any radiation and can be performed easily in a matter of minutes. Make changes to your daily diet as told by your health care provider. You may be told to: Limit the amount of salt that you eat. Eat 5 or more servings of fruits and vegetables each day. Limit the amount of meat, poultry, fish, and eggs that you eat. Collect a 24-hour urine sample as told by your health care provider. You may need to collect another urine sample every 6-12 months. SEEK MEDICAL CARE IF: You experience pain that is progressive and unresponsive to any pain medicine you have been prescribed. SEEK IMMEDIATE MEDICAL CARE IF:  Pain cannot be  controlled with the prescribed medicine. You have a fever or shaking chills. The severity or intensity of pain increases over 18 hours and is not relieved by pain medicine. You develop a new onset of abdominal pain. You feel faint or pass out. You are unable to urinate.   This information is not intended to replace advice given to you by your health care provider. Make sure you discuss any questions you have with your health care provider.   Document Released: 09/18/2005 Document Revised: 06/09/2015 Document Reviewed: 02/19/2013 Elsevier Interactive Patient Education [employer].

## 2019-02-02 NOTE — ED Notes (Signed)
Pt did not sign but verbalizes understanding of DC instruction  Chart in use by another and his ride is here

## 2019-02-03 LAB — URINE CULTURE: Special Requests: NORMAL

## 2019-02-06 DIAGNOSIS — M1611 Unilateral primary osteoarthritis, right hip: Secondary | ICD-10-CM | POA: Diagnosis not present

## 2019-02-10 DIAGNOSIS — N200 Calculus of kidney: Secondary | ICD-10-CM | POA: Diagnosis not present

## 2019-02-25 ENCOUNTER — Other Ambulatory Visit: Payer: Self-pay

## 2019-02-25 MED ORDER — EPLERENONE 25 MG PO TABS: 25.0000 mg | ORAL_TABLET | Freq: Every day | ORAL | 1 refills | Status: DC

## 2019-02-25 MED ORDER — HYDRALAZINE HCL 50 MG PO TABS: 50.0000 mg | ORAL_TABLET | Freq: Every day | ORAL | 1 refills | Status: DC

## 2019-02-25 MED ORDER — NEBIVOLOL HCL 20 MG PO TABS: ORAL_TABLET | ORAL | 1 refills | Status: DC

## 2019-02-25 MED ORDER — NITROGLYCERIN 0.4 MG SL SUBL: 0.4000 mg | SUBLINGUAL_TABLET | SUBLINGUAL | 1 refills | Status: DC | PRN

## 2019-03-03 DIAGNOSIS — M1611 Unilateral primary osteoarthritis, right hip: Secondary | ICD-10-CM | POA: Diagnosis not present

## 2019-03-13 ENCOUNTER — Other Ambulatory Visit: Payer: Self-pay | Admitting: Cardiology

## 2019-03-13 DIAGNOSIS — I10 Essential (primary) hypertension: Secondary | ICD-10-CM | POA: Diagnosis not present

## 2019-03-13 DIAGNOSIS — M25559 Pain in unspecified hip: Secondary | ICD-10-CM | POA: Diagnosis not present

## 2019-03-13 DIAGNOSIS — Z01810 Encounter for preprocedural cardiovascular examination: Secondary | ICD-10-CM

## 2019-03-13 DIAGNOSIS — I25118 Atherosclerotic heart disease of native coronary artery with other forms of angina pectoris: Secondary | ICD-10-CM

## 2019-03-13 DIAGNOSIS — G4733 Obstructive sleep apnea (adult) (pediatric): Secondary | ICD-10-CM | POA: Diagnosis not present

## 2019-03-13 NOTE — Telephone Encounter (Signed)
Please fill

## 2019-03-24 DIAGNOSIS — N200 Calculus of kidney: Secondary | ICD-10-CM | POA: Diagnosis not present

## 2019-04-01 ENCOUNTER — Telehealth: Payer: Self-pay | Admitting: *Deleted

## 2019-04-01 ENCOUNTER — Telehealth: Payer: Self-pay | Admitting: Gastroenterology

## 2019-04-01 NOTE — Telephone Encounter (Signed)
Called patient and spoke with spouse. She is aware patient will need COVID-19 testing prior to procedure. Aware patient will need to be quarantined after testing until procedure is done. If not, procedure will be cancelled. She voiced understanding. COVID testing scheduled for 7/10 at 10:25am.

## 2019-04-01 NOTE — Telephone Encounter (Signed)
Patient wife called and asked if patient covid test can be moved to the 3pm appointment.  Please call when you can

## 2019-04-01 NOTE — Telephone Encounter (Signed)
Called spouse and lab test rescheduled to 3pm

## 2019-04-03 ENCOUNTER — Other Ambulatory Visit: Payer: Self-pay | Admitting: Cardiology

## 2019-04-03 DIAGNOSIS — I10 Essential (primary) hypertension: Secondary | ICD-10-CM

## 2019-04-03 MED ORDER — SPIRONOLACTONE 50 MG PO TABS: 50.0000 mg | ORAL_TABLET | ORAL | 1 refills | Status: DC

## 2019-04-07 ENCOUNTER — Telehealth: Payer: Self-pay

## 2019-04-07 ENCOUNTER — Ambulatory Visit (INDEPENDENT_AMBULATORY_CARE_PROVIDER_SITE_OTHER): Payer: PPO

## 2019-04-07 DIAGNOSIS — Z01810 Encounter for preprocedural cardiovascular examination: Secondary | ICD-10-CM

## 2019-04-07 DIAGNOSIS — I25118 Atherosclerotic heart disease of native coronary artery with other forms of angina pectoris: Secondary | ICD-10-CM | POA: Diagnosis not present

## 2019-04-07 NOTE — Telephone Encounter (Signed)
Received fax from [facility], asking to substitute metoprolol for bystolic and spironolactone for Inspra. Per [place] ok to switch to inspra but pt cannot take Metoprolol  due to Bradycardia /Efficacy

## 2019-04-07 NOTE — Telephone Encounter (Signed)
Per [place] pt cannot not take Metoprolol due to Bradycardia , ok to switch spironolactone to Inspra.

## 2019-04-08 ENCOUNTER — Encounter: Payer: Self-pay | Admitting: Cardiology

## 2019-04-08 NOTE — Progress Notes (Signed)
I discussed with the patient the results of the stress test.  He is scheduled for left hip arthroplasty, surgical note for clearance will be sent.  We also discussed regarding Bystolic, if changing to generic is needed, we could try labetalol.  Continue Eliquis which will be held for 48 hours prior to surgery and restart post surgery when stable.

## 2019-04-11 ENCOUNTER — Other Ambulatory Visit: Payer: Self-pay

## 2019-04-11 ENCOUNTER — Other Ambulatory Visit (HOSPITAL_COMMUNITY): Payer: PPO

## 2019-04-11 ENCOUNTER — Telehealth: Payer: Self-pay

## 2019-04-11 ENCOUNTER — Other Ambulatory Visit (HOSPITAL_COMMUNITY)
Admission: RE | Admit: 2019-04-11 | Discharge: 2019-04-11 | Disposition: A | Discharge status: Home / Self Care | Payer: PPO | Source: Ambulatory Visit | Attending: Gastroenterology | Admitting: Gastroenterology

## 2019-04-11 DIAGNOSIS — Z01812 Encounter for preprocedural laboratory examination: Secondary | ICD-10-CM | POA: Diagnosis not present

## 2019-04-11 DIAGNOSIS — Z1159 Encounter for screening for other viral diseases: Secondary | ICD-10-CM | POA: Insufficient documentation

## 2019-04-11 NOTE — Telephone Encounter (Signed)
Received phone call from [facility], requesting CMP labs, I tried calling back but cannot get through. We cannot send labs as they were not ordered by our practice.

## 2019-04-12 LAB — SARS CORONAVIRUS 2 (TAT 6-24 HRS): SARS Coronavirus 2: NEGATIVE

## 2019-04-14 NOTE — Progress Notes (Signed)
CC'D TO PCP °

## 2019-04-15 ENCOUNTER — Telehealth: Payer: Self-pay | Admitting: Gastroenterology

## 2019-04-15 NOTE — Telephone Encounter (Signed)
Called patient. Made aware he can have anything on his clear liquids list that he needs. Nothing further needed

## 2019-04-15 NOTE — Telephone Encounter (Signed)
[phone] PLEASE CALL PATIENTS WIFE, WANTS TO MAKE SURE BROTH IS ENOUGH FOR HIM TO HAVE ON HIS STOMACH BEFORE HE TAKES HIS MEDICATIONS PRIOR TO HIS PROCEDURE

## 2019-04-16 ENCOUNTER — Encounter (HOSPITAL_COMMUNITY)
Admission: RE | Disposition: A | Discharge status: Home / Self Care | Payer: Self-pay | Source: Home / Self Care | Attending: Gastroenterology

## 2019-04-16 ENCOUNTER — Encounter (HOSPITAL_COMMUNITY): Payer: Self-pay

## 2019-04-16 ENCOUNTER — Other Ambulatory Visit: Payer: Self-pay

## 2019-04-16 ENCOUNTER — Encounter (HOSPITAL_COMMUNITY): Payer: Self-pay | Admitting: *Deleted

## 2019-04-16 ENCOUNTER — Ambulatory Visit (HOSPITAL_COMMUNITY)
Admission: RE | Admit: 2019-04-16 | Discharge: 2019-04-16 | Disposition: A | Discharge status: Home / Self Care | Payer: PPO | Attending: Gastroenterology | Admitting: Gastroenterology

## 2019-04-16 DIAGNOSIS — K621 Rectal polyp: Secondary | ICD-10-CM | POA: Diagnosis not present

## 2019-04-16 DIAGNOSIS — K317 Polyp of stomach and duodenum: Secondary | ICD-10-CM | POA: Diagnosis not present

## 2019-04-16 DIAGNOSIS — Z86010 Personal history of colonic polyps: Secondary | ICD-10-CM | POA: Insufficient documentation

## 2019-04-16 DIAGNOSIS — Z9841 Cataract extraction status, right eye: Secondary | ICD-10-CM | POA: Insufficient documentation

## 2019-04-16 DIAGNOSIS — Z86718 Personal history of other venous thrombosis and embolism: Secondary | ICD-10-CM | POA: Diagnosis not present

## 2019-04-16 DIAGNOSIS — E785 Hyperlipidemia, unspecified: Secondary | ICD-10-CM | POA: Diagnosis not present

## 2019-04-16 DIAGNOSIS — K760 Fatty (change of) liver, not elsewhere classified: Secondary | ICD-10-CM | POA: Insufficient documentation

## 2019-04-16 DIAGNOSIS — K648 Other hemorrhoids: Secondary | ICD-10-CM | POA: Diagnosis not present

## 2019-04-16 DIAGNOSIS — E1140 Type 2 diabetes mellitus with diabetic neuropathy, unspecified: Secondary | ICD-10-CM | POA: Insufficient documentation

## 2019-04-16 DIAGNOSIS — K635 Polyp of colon: Secondary | ICD-10-CM | POA: Diagnosis not present

## 2019-04-16 DIAGNOSIS — Z79899 Other long term (current) drug therapy: Secondary | ICD-10-CM | POA: Insufficient documentation

## 2019-04-16 DIAGNOSIS — D122 Benign neoplasm of ascending colon: Secondary | ICD-10-CM | POA: Insufficient documentation

## 2019-04-16 DIAGNOSIS — R195 Other fecal abnormalities: Secondary | ICD-10-CM

## 2019-04-16 DIAGNOSIS — E669 Obesity, unspecified: Secondary | ICD-10-CM | POA: Diagnosis not present

## 2019-04-16 DIAGNOSIS — Z7984 Long term (current) use of oral hypoglycemic drugs: Secondary | ICD-10-CM | POA: Insufficient documentation

## 2019-04-16 DIAGNOSIS — Q438 Other specified congenital malformations of intestine: Secondary | ICD-10-CM | POA: Diagnosis not present

## 2019-04-16 DIAGNOSIS — Z6837 Body mass index (BMI) 37.0-37.9, adult: Secondary | ICD-10-CM | POA: Diagnosis not present

## 2019-04-16 DIAGNOSIS — D125 Benign neoplasm of sigmoid colon: Secondary | ICD-10-CM | POA: Diagnosis not present

## 2019-04-16 DIAGNOSIS — I10 Essential (primary) hypertension: Secondary | ICD-10-CM | POA: Diagnosis not present

## 2019-04-16 DIAGNOSIS — Z9842 Cataract extraction status, left eye: Secondary | ICD-10-CM | POA: Insufficient documentation

## 2019-04-16 DIAGNOSIS — Z8673 Personal history of transient ischemic attack (TIA), and cerebral infarction without residual deficits: Secondary | ICD-10-CM | POA: Insufficient documentation

## 2019-04-16 DIAGNOSIS — D6851 Activated protein C resistance: Secondary | ICD-10-CM | POA: Insufficient documentation

## 2019-04-16 DIAGNOSIS — K644 Residual hemorrhoidal skin tags: Secondary | ICD-10-CM | POA: Insufficient documentation

## 2019-04-16 DIAGNOSIS — K2970 Gastritis, unspecified, without bleeding: Secondary | ICD-10-CM | POA: Insufficient documentation

## 2019-04-16 DIAGNOSIS — R011 Cardiac murmur, unspecified: Secondary | ICD-10-CM | POA: Insufficient documentation

## 2019-04-16 DIAGNOSIS — K2950 Unspecified chronic gastritis without bleeding: Secondary | ICD-10-CM | POA: Diagnosis not present

## 2019-04-16 DIAGNOSIS — G4730 Sleep apnea, unspecified: Secondary | ICD-10-CM | POA: Insufficient documentation

## 2019-04-16 DIAGNOSIS — K319 Disease of stomach and duodenum, unspecified: Secondary | ICD-10-CM | POA: Insufficient documentation

## 2019-04-16 DIAGNOSIS — Z955 Presence of coronary angioplasty implant and graft: Secondary | ICD-10-CM | POA: Insufficient documentation

## 2019-04-16 DIAGNOSIS — Z7901 Long term (current) use of anticoagulants: Secondary | ICD-10-CM | POA: Insufficient documentation

## 2019-04-16 DIAGNOSIS — N400 Enlarged prostate without lower urinary tract symptoms: Secondary | ICD-10-CM | POA: Insufficient documentation

## 2019-04-16 DIAGNOSIS — K921 Melena: Secondary | ICD-10-CM | POA: Insufficient documentation

## 2019-04-16 DIAGNOSIS — D124 Benign neoplasm of descending colon: Secondary | ICD-10-CM | POA: Diagnosis not present

## 2019-04-16 DIAGNOSIS — Z809 Family history of malignant neoplasm, unspecified: Secondary | ICD-10-CM | POA: Insufficient documentation

## 2019-04-16 DIAGNOSIS — I2510 Atherosclerotic heart disease of native coronary artery without angina pectoris: Secondary | ICD-10-CM | POA: Insufficient documentation

## 2019-04-16 DIAGNOSIS — D123 Benign neoplasm of transverse colon: Secondary | ICD-10-CM | POA: Diagnosis not present

## 2019-04-16 HISTORY — PX: POLYPECTOMY: SHX5525

## 2019-04-16 HISTORY — PX: COLONOSCOPY: SHX5424

## 2019-04-16 HISTORY — PX: ESOPHAGOGASTRODUODENOSCOPY: SHX5428

## 2019-04-16 HISTORY — PX: BIOPSY: SHX5522

## 2019-04-16 LAB — GLUCOSE, CAPILLARY: Glucose-Capillary: 209 mg/dL — ABNORMAL HIGH (ref 70–99)

## 2019-04-16 SURGERY — COLONOSCOPY
Anesthesia: Moderate Sedation

## 2019-04-16 MED ORDER — LIDOCAINE VISCOUS HCL 2 % MT SOLN
OROMUCOSAL | Status: DC | PRN
  Administered 2019-04-16: 1 via OROMUCOSAL

## 2019-04-16 MED ORDER — SODIUM CHLORIDE 0.9 % IV SOLN
INTRAVENOUS | Status: DC
  Administered 2019-04-16: 08:00:00 via INTRAVENOUS

## 2019-04-16 MED ORDER — MEPERIDINE HCL 100 MG/ML IJ SOLN
INTRAMUSCULAR | Status: AC
  Filled 2019-04-16: qty 1

## 2019-04-16 MED ORDER — MIDAZOLAM HCL 5 MG/5ML IJ SOLN
INTRAMUSCULAR | Status: AC
  Filled 2019-04-16: qty 5

## 2019-04-16 MED ORDER — STERILE WATER FOR IRRIGATION IR SOLN
Status: DC | PRN
  Administered 2019-04-16: 5 mL

## 2019-04-16 MED ORDER — MIDAZOLAM HCL 5 MG/5ML IJ SOLN
INTRAMUSCULAR | Status: DC | PRN
  Administered 2019-04-16: 1 mg via INTRAVENOUS
  Administered 2019-04-16 (×2): 2 mg via INTRAVENOUS
  Administered 2019-04-16: 1 mg via INTRAVENOUS

## 2019-04-16 MED ORDER — MEPERIDINE HCL 100 MG/ML IJ SOLN
INTRAMUSCULAR | Status: DC | PRN
  Administered 2019-04-16: 50 mg
  Administered 2019-04-16 (×2): 25 mg

## 2019-04-16 MED ORDER — LIDOCAINE VISCOUS HCL 2 % MT SOLN
OROMUCOSAL | Status: AC
  Filled 2019-04-16: qty 15

## 2019-04-16 MED ORDER — OMEPRAZOLE 20 MG PO CPDR: DELAYED_RELEASE_CAPSULE | ORAL | 3 refills | Status: DC

## 2019-04-16 NOTE — Discharge Instructions (Signed)
I REMOVED 15 COLON POLYPS. I PLACED A CLIP IN YOUR LEFT COLON TO PREVENT BLEEDING IN 7-10 DAYS. You have internal hemorrhoids. YOU DO NOT HAVE BARRETT'S ESOPHAGUS. 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 I biopsied your stomach.    RE-START ELIQUIS JUL 17.  IF YOU NEED AN MRI, LET THE RADIOLOGY TECH KNOW THAT YOU HAD ONE CLIP PLACED IN YOUR COLON. IT SHOULD FALL OFF IN 30 DAYS.   DRINK WATER TO KEEP YOUR URINE LIGHT YELLOW.  Do not drink DIET SODA, AVOID HIGH FRUCTOSE CORN SYRUP, avoid SUGAR FREE GUM, &  ALL ARTIFICIAL SWEETENERS. USE STEVIA AS A SWEETENER OR AGAVE NECTAR.  CONTINUE YOUR WEIGHT LOSS EFFORTS. 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 OBESITY IS ASSOCIATED WITH AN INCREASED FOR CIRRHOSIS AND ALL CANCERS, INCLUDING ESOPHAGEAL AND COLON CANCER  AND DECREASES YOUR LIFE EXPECTANCY 10 YEARS. I RECOMMEND YOU READ AND FOLLOW RECOMMENDATIONS BY DR. MARK HYMAN, "10-DAY DETOX DIET".  FOLLOW A HIGH FIBER/LOW FAT DIET. AVOID ITEMS THAT CAUSE BLOATING. SEE INFO BELOW.   TO TREAT GASTRITIS AND DUODENITIS, START OMEPRAZOLE.  TAKE 30 MINUTES PRIOR TO YOUR FIRST MEAL FOREVER.   USE PREPARATION H FOUR TIMES  A DAY IF NEEDED TO RELIEVE RECTAL PAIN/PRESSURE/BLEEDING.   YOUR BIOPSY RESULTS WILL BE BACK IN 5 BUSINESS DAYS.  We do not routinely screen for polyps after the age of 65.  ENDOSCOPY Care After Read the instructions outlined below and refer to this sheet in the next week. These discharge instructions provide you with general information on caring for yourself after you leave the hospital. While your treatment has been planned according to the most current medical practices available, unavoidable complications occasionally occur. If you have any problems or questions after discharge, call DR. Reis Pienta, [phone].  ACTIVITY  You may resume your regular activity, but move at a slower pace for the next 24 hours.    Take frequent rest periods for the next 24 hours.   Walking will help get rid of the air and reduce the bloated feeling in your belly (abdomen).   No driving for 24 hours (because of the medicine (anesthesia) used during the test).   You may shower.   Do not sign any important legal documents or operate any machinery for 24 hours (because of the anesthesia used during the test).    NUTRITION  Drink plenty of fluids.   You may resume your normal diet as instructed by your doctor.   Begin with a light meal and progress to your normal diet. Heavy or fried foods are harder to digest and may make you feel sick to your stomach (nauseated).   Avoid alcoholic beverages for 24 hours or as instructed.    MEDICATIONS  You may resume your normal medications.   WHAT YOU CAN EXPECT TODAY  Some feelings of bloating in the abdomen.   Passage of more gas than usual.   Spotting of blood in your stool or on the toilet paper  .  IF YOU HAD POLYPS REMOVED DURING THE ENDOSCOPY:  Eat a soft diet IF YOU HAVE NAUSEA, BLOATING, ABDOMINAL PAIN, OR VOMITING.    FINDING OUT THE RESULTS OF YOUR TEST Not all test results are available during your visit. DR. Oneida Alar WILL CALL YOU WITHIN 7 DAYS OF YOUR PROCEDUE WITH YOUR RESULTS. Do not assume everything is normal if you have not heard from DR. Nyal Schachter IN ONE WEEK, CALL HER OFFICE  AT [phone].  SEEK IMMEDIATE MEDICAL ATTENTION AND CALL THE OFFICE: [phone] IF:  You have more than a spotting of blood in your stool.   Your belly is swollen (abdominal distention).   You are nauseated or vomiting.   You have a temperature over 101F.   You have abdominal pain or discomfort that is severe or gets worse throughout the day.  Polyps, Colon  A polyp is extra tissue that grows inside your body. Colon polyps grow in the large intestine. The large intestine, also called the colon, is part of your digestive system. It is a long, hollow tube at  the end of your digestive tract where your body makes and stores stool. Most polyps are not dangerous. They are benign. This means they are not cancerous. But over time, some types of polyps can turn into cancer. Polyps that are smaller than a pea are usually not harmful. But larger polyps could someday become or may already be cancerous. To be safe, doctors remove all polyps and test them.   WHO GETS POLYPS? Anyone can get polyps, but certain people are more likely than others. You may have a greater chance of getting polyps if:  You are over 50.   You have had polyps before.   Someone in your family has had polyps.   Someone in your family has had cancer of the large intestine.   Find out if someone in your family has had polyps. You may also be more likely to get polyps if you:   Eat a lot of fatty foods   Smoke   Drink alcohol   Do not exercise  Eat too much   PREVENTION There is not one sure way to prevent polyps. You might be able to lower your risk of getting them if you:  Eat more fruits and vegetables and less fatty food.   Do not smoke.   Avoid alcohol.   Exercise every day.   Lose weight if you are overweight.   Eating more calcium and folate can also lower your risk of getting polyps. Some foods that are rich in calcium are milk, cheese, and broccoli. Some foods that are rich in folate are chickpeas, kidney beans, and spinach.    Gastritis  Gastritis is an inflammation (the body's way of reacting to injury and/or infection) of the stomach. It is often caused by viral or bacterial (germ) infections. It can also be caused BY ASPIRIN, BC/GOODY POWDER'S, (IBUPROFEN) MOTRIN, OR ALEVE (NAPROXEN), chemicals (including alcohol), SPICY FOODS, and medications. This illness may be associated with generalized malaise (feeling tired, not well), UPPER ABDOMINAL STOMACH cramps, and fever. One common bacterial cause of gastritis is an organism known as H. Pylori. This can be  treated with antibiotics.    High-Fiber Diet A high-fiber diet changes your normal diet to include more whole grains, legumes, fruits, and vegetables. Changes in the diet involve replacing refined carbohydrates with unrefined foods. The calorie level of the diet is essentially unchanged. The Dietary Reference Intake (recommended amount) for adult males is 38 grams per day. For adult females, it is 25 grams per day. Pregnant and lactating women should consume 28 grams of fiber per day. Fiber is the intact part of a plant that is not broken down during digestion. Functional fiber is fiber that has been isolated from the plant to provide a beneficial effect in the body.  PURPOSE  Increase stool bulk.   Ease and regulate bowel movements.   Lower cholesterol.  REDUCE RISK OF COLON CANCER  INDICATIONS THAT YOU NEED MORE FIBER  Constipation and hemorrhoids.   Uncomplicated diverticulosis (intestine condition) and irritable bowel syndrome.   Weight management.   As a protective measure against hardening of the arteries (atherosclerosis), diabetes, and cancer.   GUIDELINES FOR INCREASING FIBER IN THE DIET  Start adding fiber to the diet slowly. A gradual increase of about 5 more grams (2 slices of whole-wheat bread, 2 servings of most fruits or vegetables, or 1 bowl of high-fiber cereal) per day is best. Too rapid an increase in fiber may result in constipation, flatulence, and bloating.   Drink enough water and fluids to keep your urine clear or pale yellow. Water, juice, or caffeine-free drinks are recommended. Not drinking enough fluid may cause constipation.   Eat a variety of high-fiber foods rather than one type of fiber.   Try to increase your intake of fiber through using high-fiber foods rather than fiber pills or supplements that contain small amounts of fiber.   The goal is to change the types of food eaten. Do not supplement your present diet with high-fiber foods, but replace  foods in your present diet.   INCLUDE A VARIETY OF FIBER SOURCES  Replace refined and processed grains with whole grains, canned fruits with fresh fruits, and incorporate other fiber sources. White rice, white breads, and most bakery goods contain little or no fiber.   Brown whole-grain rice, buckwheat oats, and many fruits and vegetables are all good sources of fiber. These include: broccoli, Brussels sprouts, cabbage, cauliflower, beets, sweet potatoes, white potatoes (skin on), carrots, tomatoes, eggplant, squash, berries, fresh fruits, and dried fruits.   Cereals appear to be the richest source of fiber. Cereal fiber is found in whole grains and bran. Bran is the fiber-rich outer coat of cereal grain, which is largely removed in refining. In whole-grain cereals, the bran remains. In breakfast cereals, the largest amount of fiber is found in those with "bran" in their names. The fiber content is sometimes indicated on the label.   You may need to include additional fruits and vegetables each day.   In baking, for 1 cup white flour, you may use the following substitutions:   1 cup whole-wheat flour minus 2 tablespoons.   1/2 cup white flour plus 1/2 cup whole-wheat flour.

## 2019-04-16 NOTE — Op Note (Signed)
[facility] Patient Name: Omar [name] Procedure Date: 04/16/2019 8:16 AM MRN: [id number] Date of Birth: Feb 29, 1946 Attending MD: Barney Drain MD, MD CSN: [id number] Age: 73 Admit Type: Outpatient Procedure:                Colonoscopy WITH COLD SNARE POLYPECTOMY/CLIP x 1 Indications:              Personal history of colonic polyps Providers:                Barney Drain MD, MD, Janeece Riggers, RN, Nelma Rothman,                            Technician Referring MD:             Jasper Loser. Luan Pulling MD, MD Medicines:                Meperidine 75 mg IV, Midazolam 5 mg IV Complications:            No immediate complications. Estimated Blood Loss:     Estimated blood loss was minimal. Procedure:                Pre-Anesthesia Assessment:                           - Prior to the procedure, a History and Physical                            was performed, and patient medications and                            allergies were reviewed. The patient's tolerance of                            previous anesthesia was also reviewed. The risks                            and benefits of the procedure and the sedation                            options and risks were discussed with the patient.                            All questions were answered, and informed consent                            was obtained. Prior Anticoagulants: The patient has                            taken Eliquis (apixaban), last dose was 2 days                            prior to procedure. ASA Grade Assessment: II - A                            patient with mild systemic disease. After reviewing  the risks and benefits, the patient was deemed in                            satisfactory condition to undergo the procedure.                            After obtaining informed consent, the colonoscope                            was passed under direct vision. Throughout the                            procedure, the  patient's blood pressure, pulse, and                            oxygen saturations were monitored continuously. The                            CF-[id number] ([id number]) scope was introduced through                            the anus and advanced to the the cecum, identified                            by appendiceal orifice and ileocecal valve. The                            colonoscopy was somewhat difficult due to a                            tortuous colon and the patient's body habitus.                            Successful completion of the procedure was aided by                            straightening and shortening the scope to obtain                            bowel loop reduction and COLOWRAP. The patient                            tolerated the procedure well. The quality of the                            bowel preparation was excellent. The ileocecal                            valve, appendiceal orifice, and rectum were                            photographed. Scope In: 2:11:94 AM Scope Out: 9:31:59 AM Scope Withdrawal Time: 0 hours 33 minutes 32 seconds  Total Procedure  Duration: 0 hours 35 minutes 18 seconds  Findings:      FOURTEEN sessile polyps were found in the rectum, sigmoid colon,       descending colon, transverse colon, hepatic flexure and ascending colon.       The polyps were 4 to 12 mm in size. These polyps were removed with a       cold snare. Resection and retrieval were complete.      A 15 mm polyp was found in the sigmoid colon. The polyp was sessile. The       polyp was removed with a cold snare(BTL 3). Resection and retrieval were       complete. To prevent bleeding after the polypectomy, one hemostatic clip       was successfully placed (MR conditional)(~27 CM FROM THE ANAL VERGE).       There was no bleeding at the end of the procedure.      External and internal hemorrhoids were found.      The recto-sigmoid colon and sigmoid colon were mildly  tortuous. Impression:               - FOURTEEN 4 to 12 mm polyps in the rectum, in the                            sigmoid colon, in the descending colon, in the                            transverse colon, at the hepatic flexure and in the                            ascending colon, removed with a cold snare.                            Resected and retrieved.                           - One 15 mm polyp in the sigmoid colon, removed                            with a cold snare. Resected and retrieved. Clip (MR                            conditional) was placed.                           - External and internal hemorrhoids.                           - Tortuous LEFT colon. Moderate Sedation:      Moderate (conscious) sedation was administered by the endoscopy nurse       and supervised by the endoscopist. The following parameters were       monitored: oxygen saturation, heart rate, blood pressure, and response       to care. Total physician intraservice time was 62 minutes. Recommendation:           - Patient has a contact number available for  emergencies. The signs and symptoms of potential                            delayed complications were discussed with the                            patient. Return to normal activities tomorrow.                            Written discharge instructions were provided to the                            patient.                           - NEEDS MRI PRIOR TO KUB DUE TO CLIP PLACEMENT IN                            COLON.                           - High fiber diet.                           - Continue present medications.                           - Await pathology results.                           - Repeat colonoscopy is not recommended due to                            current age (42 years or older) for surveillance. Procedure Code(s):        --- Professional ---                           [phone], Colonoscopy, flexible; with  removal of                            tumor(s), polyp(s), or other lesion(s) by snare                            technique                           99153, Moderate sedation; each additional 15                            minutes intraservice time                           99153, Moderate sedation; each additional 15                            minutes intraservice time  76226, Moderate sedation; each additional 15                            minutes intraservice time                           G0500, Moderate sedation services provided by the                            same physician or other qualified health care                            professional performing a gastrointestinal                            endoscopic service that sedation supports,                            requiring the presence of an independent trained                            observer to assist in the monitoring of the                            patient's level of consciousness and physiological                            status; initial 15 minutes of intra-service time;                            patient age 5 years or older (additional time may                            be reported with [phone], as appropriate) Diagnosis Code(s):        --- Professional ---                           K62.1, Rectal polyp                           K63.5, Polyp of colon                           K64.8, Other hemorrhoids                           Z86.010, Personal history of colonic polyps                           Q43.8, Other specified congenital malformations of                            intestine CPT copyright 2019 [employer]. All rights reserved. The codes documented in this report are preliminary and upon coder review may  be revised to meet current compliance requirements. Barney Drain, MD Barney Drain MD, MD  04/16/2019 10:16:24 AM This report has been signed electronically. Number  of Addenda: 0

## 2019-04-16 NOTE — H&P (Signed)
Primary Care Physician:  Sinda Du, MD Primary Gastroenterologist:  Dr. Oneida Alar  Pre-Procedure History & Physical: HPI:  Omar [name] is a 73 y.o. male here for POS COLOGUARD TEST/SCREENING FOR BARRETT'S ESOPHAGUS.  Past Medical History:  Diagnosis Date  . Abnormal EKG    Inferior Q waves  . BPH (benign prostatic hyperplasia)   . Chronic anticoagulation 01/16/2017  . Coronary artery disease   . Degenerative joint disease   . Diabetes mellitus, type 2 (HCC)    No insulin  . DVT (deep venous thrombosis) ([place])   . DVT of leg (deep venous thrombosis) ([place]) 07/02/2015  . Heart murmur   . Hepatic steatosis   . Heterozygous factor V Leiden mutation ([place]) 01/16/2017  . Hyperlipidemia   . 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 Meningioma (HCC)    Left frontal; CVA identified by MRI; no neurologic symptoms  . Neuropathy   . Obesity   . Sleep apnea 2008   Dr. Merlene Laughter interpreted the study-CPAP recommended, but refused by patient  . Stroke [facility])    no deficits from stroke, "Didnt know I had one when they say I did".    Past Surgical History:  Procedure Laterality Date  . CATARACT EXTRACTION W/PHACO Left 04/03/2016   Procedure: CATARACT EXTRACTION PHACO AND INTRAOCULAR LENS PLACEMENT LEFT EYE CDE=21.76;  Surgeon: Williams Che, MD;  Location: [facility];  Service: Ophthalmology;  Laterality: Left;  . CATARACT EXTRACTION W/PHACO Right 06/19/2016   Procedure: CATARACT EXTRACTION PHACO AND INTRAOCULAR LENS PLACEMENT; CDE:  11.56;  Surgeon: Williams Che, MD;  Location: [facility];  Service: Ophthalmology;  Laterality: Right;  . COLONOSCOPY W/ POLYPECTOMY  05/2008   polypectomy x4-adenomatous; internal hemorrhoids  . CORONARY ANGIOPLASTY WITH STENT PLACEMENT  06/04/2012     DES    to [place]    . DUPUYTREN / PALMAR FASCIOTOMY  2009   rt hand-dsc-went home same day x2  . LEFT HEART  CATHETERIZATION WITH CORONARY ANGIOGRAM N/A 05/28/2012   Procedure: LEFT HEART CATHETERIZATION WITH CORONARY ANGIOGRAM;  Surgeon: Laverda Page, MD;  Location: [facility] CATH LAB;  Service: Cardiovascular;  Laterality: N/A;  . PERCUTANEOUS CORONARY STENT INTERVENTION (PCI-S) N/A 06/04/2012   Procedure: PERCUTANEOUS CORONARY STENT INTERVENTION (PCI-S);  Surgeon: Laverda Page, MD;  Location: [facility] CATH LAB;  Service: Cardiovascular;  Laterality: N/A;  . TONSILLECTOMY    . VENTRAL HERNIA REPAIR  2010   umb hernia-[facility]-went home same day    Prior to Admission medications   Medication Sig Start Date End Date Taking? Authorizing Provider  apixaban (ELIQUIS) 5 MG TABS tablet Take 1 tablet (5 mg total) by mouth 2 (two) times daily. For the first 7 days, take 2 tablets twice a day. Then begin 1 tablet twice a day. Patient taking differently: Take 5 mg by mouth 2 (two) times daily.  08/21/16  Yes Nat Christen, MD  Calcium-Vitamin D-Vitamin K 650-12.5-40 MG-MCG-MCG CHEW Chew 2 tablets by mouth at bedtime.   Yes [provider]  eplerenone (INSPRA) 50 MG tablet Take 50 mg by mouth daily.   Yes [provider]  gabapentin (NEURONTIN) 100 MG capsule TAKE (1) CAPSULE BY MOUTH TWICE DAILY. Patient taking differently: Take 100 mg by mouth 2 (two) times daily.  07/03/18  Yes Nida, Marella Chimes, MD  hydrALAZINE (APRESOLINE) 50 MG tablet TAKE (1) TABLET BY MOUTH (3) TIMES DAILY. Patient taking differently:  Take 50 mg by mouth 2 (two) times a day.  03/13/19  Yes Adrian Prows, MD  lisinopril-hydrochlorothiazide (PRINZIDE,ZESTORETIC) 20-25 MG per tablet Take 1 tablet by mouth daily.   Yes [provider]  metFORMIN (GLUCOPHAGE) 1000 MG tablet TAKE (1) TABLET BY MOUTH TWICE DAILY. Patient taking differently: Take 1,000 mg by mouth 2 (two) times a day.  02/21/16  Yes Nida, Marella Chimes, MD  Multiple Vitamins-Minerals (ONE-A-DAY 50 PLUS PO) Take 1 tablet by mouth at bedtime.    Yes [provider]  Nebivolol HCl (BYSTOLIC) 20 MG TABS TAKE (1) TABLET BY MOUTH ONCE DAILY. Patient taking differently: Take 20 mg by mouth daily.  02/25/19  Yes Adrian Prows, MD  pravastatin (PRAVACHOL) 40 MG tablet Take 40 mg by mouth at bedtime.    Yes [provider]  tamsulosin (FLOMAX) 0.4 MG CAPS capsule Take 0.4 mg by mouth daily.   Yes [provider]  traMADol (ULTRAM) 50 MG tablet Take 1 tablet (50 mg total) by mouth every 6 (six) hours as needed. Patient taking differently: Take 50-100 mg by mouth 2 (two) times daily as needed for moderate pain.  12/09/16  Yes Fredia Sorrow, MD  traZODone (DESYREL) 100 MG tablet Take 100 mg by mouth at bedtime.   Yes [provider]  TRULICITY 1.5 XL/2.4MW SOPN INJECT 1.5MG INTO THE SKIN ONCE A WEEK. Patient taking differently: Inject 1.5 mg as directed once a week.  10/30/18  Yes Nida, Marella Chimes, MD  verapamil (CALAN-SR) 240 MG CR tablet Take 1 tablet (240 mg total) by mouth at bedtime. 05/12/13  Yes Rothbart, Cristopher Estimable, MD  vitamin B-12 (CYANOCOBALAMIN) 500 MCG tablet Take 500 mcg by mouth daily.   Yes [provider]  Na Sulfate-K Sulfate-Mg Sulf 17.5-3.13-1.6 GM/177ML SOLN Take 1 kit by mouth as directed. 01/23/19   Kyvon Hu, Marga Melnick, MD  nitroGLYCERIN (NITROSTAT) 0.4 MG SL tablet Place 1 tablet (0.4 mg total) under the tongue every 5 (five) minutes as needed for chest pain. 02/25/19   Adrian Prows, MD  oxyCODONE-acetaminophen (PERCOCET/ROXICET) 5-325 MG tablet Take 2 tablets by mouth every 6 (six) hours as needed for severe pain. Patient not taking: Reported on 04/09/2019 02/02/19   Long, Wonda Olds, MD  spironolactone (ALDACTONE) 50 MG tablet Take 1 tablet (50 mg total) by mouth every morning. 04/03/19   Adrian Prows, MD  torsemide (DEMADEX) 20 MG tablet Take 20 mg by mouth daily as needed (swelling).  12/19/16   [provider]    Allergies as of 01/23/2019  . (No Known Allergies)    Family History  Problem  Relation Age of Onset  . Cancer Mother   . Cancer Father   . Coronary artery disease Neg Hx     Social History   Socioeconomic History  . Marital status: Married    Spouse name: Not on file  . Number of children: Not on file  . Years of education: Not on file  . Highest education level: Not on file  Occupational History  . Occupation: Games developer    Comment: Research officer, trade union  Social Needs  . Financial resource strain: Not on file  . Food insecurity    Worry: Not on file    Inability: Not on file  . Transportation needs    Medical: Not on file    Non-medical: Not on file  Tobacco Use  . Smoking status: Never Smoker  . Smokeless tobacco: Never Used  Substance and Sexual Activity  .  Alcohol use: No  . Drug use: No  . Sexual activity: Not Currently    Birth control/protection: None  Lifestyle  . Physical activity    Days per week: Not on file    Minutes per session: Not on file  . Stress: Not on file  Relationships  . Social Herbalist on phone: Not on file    Gets together: Not on file    Attends religious service: Not on file    Active member of club or organization: Not on file    Attends meetings of clubs or organizations: Not on file    Relationship status: Not on file  . Intimate partner violence    Fear of current or ex partner: Not on file    Emotionally abused: Not on file    Physically abused: Not on file    Forced sexual activity: Not on file  Other Topics Concern  . Not on file  Social History Narrative  . Not on file    Review of Systems: See HPI, otherwise negative ROS   Physical Exam: BP 130/75   Pulse 88   Temp 99 F (37.2 C) (Oral)   Resp 19   Ht _0  (1.727 m)   Wt 112.9 kg   SpO2 97%   BMI 37.86 kg/m  General:   Alert,  pleasant and cooperative in NAD Head:  Normocephalic and atraumatic. Neck:  Supple; Lungs:  Clear throughout to auscultation.    Heart:  Regular rate and rhythm. Abdomen:  Soft, nontender  and nondistended. Normal bowel sounds, without guarding, and without rebound.   Neurologic:  Alert and  oriented x4;  grossly normal neurologically.  Impression/Plan:     POS COLOGUARD TEST/SCREENING FOR BARRETT'S ESOPHAGUS   PLAN: 1. TCS/EGD TODAY.  DISCUSSED PROCEDURE, BENEFITS, & RISKS: < 1% chance of medication reaction, bleeding, perforation, ASPIRATION, or rupture of spleen/liver requiring surgery to fix it and missed polyps < 1 cm 10-20% of the time.

## 2019-04-16 NOTE — Progress Notes (Signed)
RT placed patient on nasal mask BIPAP before having an endoscopy. Settings were [place]. Patient appeared to be tolerating well. MD at bedside

## 2019-04-16 NOTE — Op Note (Signed)
[facility] Patient Name: Omar [name] Procedure Date: 04/16/2019 9:32 AM MRN: [id number] Date of Birth: Mar 08, 1946 Attending MD: Barney Drain MD, MD CSN: [id number] Age: 73 Admit Type: Outpatient Procedure:                Upper GI endoscopy WITH COLD FORCEPS BIOPSY Indications:              Screening for Barrett's esophagus Providers:                Barney Drain MD, MD, Janeece Riggers, RN, Nelma Rothman,                            Technician Referring MD:             Jasper Loser. Luan Pulling MD, MD Medicines:                TCS + Meperidine 25 mg IV, Midazolam 1 mg IV Complications:            No immediate complications. Estimated Blood Loss:     Estimated blood loss was minimal. Procedure:                Pre-Anesthesia Assessment:                           - Prior to the procedure, a History and Physical                            was performed, and patient medications and                            allergies were reviewed. The patient's tolerance of                            previous anesthesia was also reviewed. The risks                            and benefits of the procedure and the sedation                            options and risks were discussed with the patient.                            All questions were answered, and informed consent                            was obtained. Prior Anticoagulants: The patient has                            taken Eliquis (apixaban), last dose was 2 days                            prior to procedure. ASA Grade Assessment: II - A                            patient with mild systemic disease. After reviewing  the risks and benefits, the patient was deemed in                            satisfactory condition to undergo the procedure.                            After obtaining informed consent, the endoscope was                            passed under direct vision. Throughout the                            procedure, the  patient's blood pressure, pulse, and                            oxygen saturations were monitored continuously. The                            GIF-[id number] ([id number]) was introduced through the                            mouth, and advanced to the second part of duodenum.                            The upper GI endoscopy was accomplished without                            difficulty. The patient tolerated the procedure                            well. Scope In: 9:40:57 AM Scope Out: 9:47:10 AM Total Procedure Duration: 0 hours 6 minutes 13 seconds  Findings:      The examined esophagus was normal.      Segmental mild inflammation characterized by congestion (edema) and       erythema was found in the gastric antrum. Biopsies(2: BODY, 1: INCISURA,       2: ANTRUM) were taken with a cold forceps for Helicobacter pylori       testing.      A single 5 mm sessile polyp with no stigmata of recent bleeding was       found in the gastric body. The polyp was removed with a cold biopsy       forceps. Resection and retrieval were complete.      Patchy moderate inflammation characterized by congestion (edema),       erosions and erythema was found in the entire duodenum.      A single 3 mm mucosal nodule with a localized distribution was found in       the second portion of the duodenum. BiopsieD AND REMOVED were taken with       a cold forceps for histology. Impression:               - Normal esophagus-NO BARRETT'S ESOPHAGUS.                           - MILD Gastritis/MODERATE DUODENITIS. Biopsied.                           -  A single gastric polyp. Resected and retrieved.                           - Mucosal nodule found in the duodenum. Moderate Sedation:      Moderate (conscious) sedation was administered by the endoscopy nurse       and supervised by the endoscopist. The following parameters were       monitored: oxygen saturation, heart rate, blood pressure, and response       to care. Total  physician intraservice time was 62 minutes. Recommendation:           - Patient has a contact number available for                            emergencies. The signs and symptoms of potential                            delayed complications were discussed with the                            patient. Return to normal activities tomorrow.                            Written discharge instructions were provided to the                            patient.                           - High fiber diet and low fat diet.                           - Continue present medications.                           - Await pathology results. Procedure Code(s):        --- Professional ---                           [phone], Esophagogastroduodenoscopy, flexible,                            transoral; with biopsy, single or multiple                           99153, Moderate sedation; each additional 15                            minutes intraservice time                           99153, Moderate sedation; each additional 15                            minutes intraservice time  26712, Moderate sedation; each additional 15                            minutes intraservice time                           G0500, Moderate sedation services provided by the                            same physician or other qualified health care                            professional performing a gastrointestinal                            endoscopic service that sedation supports,                            requiring the presence of an independent trained                            observer to assist in the monitoring of the                            patient's level of consciousness and physiological                            status; initial 15 minutes of intra-service time;                            patient age 34 years or older (additional time may                            be reported with [phone], as  appropriate) Diagnosis Code(s):        --- Professional ---                           K29.70, Gastritis, unspecified, without bleeding                           K31.7, Polyp of stomach and duodenum                           K29.80, Duodenitis without bleeding                           K31.89, Other diseases of stomach and duodenum                           Z13.810, Encounter for screening for upper                            gastrointestinal disorder CPT copyright 2019 [employer]. All rights reserved. The codes documented in this report are preliminary and upon coder review may  be revised to meet current compliance  requirements. Barney Drain, MD Barney Drain MD, MD 04/16/2019 11:08:28 AM This report has been signed electronically. Number of Addenda: 0

## 2019-04-21 ENCOUNTER — Encounter (HOSPITAL_COMMUNITY): Payer: Self-pay | Admitting: Gastroenterology

## 2019-04-21 ENCOUNTER — Other Ambulatory Visit: Payer: PPO

## 2019-04-21 DIAGNOSIS — R6889 Other general symptoms and signs: Secondary | ICD-10-CM | POA: Diagnosis not present

## 2019-04-21 DIAGNOSIS — Z20822 Contact with and (suspected) exposure to covid-19: Secondary | ICD-10-CM

## 2019-04-21 NOTE — Progress Notes (Signed)
Pt is aware.  

## 2019-04-24 LAB — NOVEL CORONAVIRUS, NAA: SARS-CoV-2, NAA: NOT DETECTED

## 2019-05-06 ENCOUNTER — Telehealth: Payer: Self-pay | Admitting: "Endocrinology

## 2019-05-06 NOTE — Telephone Encounter (Signed)
Done

## 2019-05-06 NOTE — Telephone Encounter (Signed)
Dr Theodosia Blender office called and said she needs his previous lab results faxed to them and documentation to as why he is on metformin. Please fax @ [phone]

## 2019-05-08 ENCOUNTER — Other Ambulatory Visit: Payer: Self-pay

## 2019-05-08 MED ORDER — TRULICITY 1.5 MG/0.5ML ~~LOC~~ SOAJ: SUBCUTANEOUS | 2 refills | Status: DC

## 2019-05-14 DIAGNOSIS — M1611 Unilateral primary osteoarthritis, right hip: Secondary | ICD-10-CM | POA: Diagnosis not present

## 2019-05-15 DIAGNOSIS — I10 Essential (primary) hypertension: Secondary | ICD-10-CM | POA: Diagnosis not present

## 2019-05-15 DIAGNOSIS — E785 Hyperlipidemia, unspecified: Secondary | ICD-10-CM | POA: Diagnosis not present

## 2019-05-15 DIAGNOSIS — Z86711 Personal history of pulmonary embolism: Secondary | ICD-10-CM | POA: Diagnosis not present

## 2019-05-15 DIAGNOSIS — E1165 Type 2 diabetes mellitus with hyperglycemia: Secondary | ICD-10-CM | POA: Diagnosis not present

## 2019-05-20 ENCOUNTER — Other Ambulatory Visit: Payer: Self-pay | Admitting: Orthopedic Surgery

## 2019-06-02 ENCOUNTER — Encounter: Payer: Self-pay | Admitting: Cardiology

## 2019-06-13 ENCOUNTER — Other Ambulatory Visit (HOSPITAL_COMMUNITY): Payer: Self-pay | Admitting: *Deleted

## 2019-06-13 NOTE — Patient Instructions (Addendum)
DUE TO COVID-19 ONLY ONE VISITOR IS ALLOWED TO COME WITH YOU AND STAY IN THE WAITING ROOM ONLY DURING PRE OP AND PROCEDURE DAY OF SURGERY. THE 1 VISITOR MAY VISIT WITH YOU AFTER SURGERY IN YOUR PRIVATE ROOM DURING VISITING HOURS ONLY!  YOU NEED TO HAVE A COVID 19 TEST ON 06-20-2019 AT  [place] SHORT STAY ENTRANCE STAY  IN CAR , THIS TEST MUST BE DONE BEFORE SURGERY. ONCE YOUR COVID TEST IS COMPLETED, PLEASE BEGIN THE QUARANTINE INSTRUCTIONS AS OUTLINED IN YOUR HANDOUT.                Omar [name]    Your procedure is scheduled on: 06-24-2019   Report to [facility] Main  Entrance   Report to admitting at 1100 AM  call dr Mardelle Matte about stopping eliquis prior to surgery   Call this number if you have problems the morning of surgery [phone]    Remember: [place], NO [place].   NO SOLID FOOD AFTER MIDNIGHT THE NIGHT PRIOR TO SURGERY. NOTHING BY MOUTH EXCEPT CLEAR LIQUIDS UNTIL 1025 AM . PLEASE FINISH G2  DRINK PER SURGEON ORDER  WHICH NEEDS TO BE COMPLETED AT 1025 AM .   CLEAR LIQUID DIET   Foods Allowed                                                                     Foods Excluded  Coffee and tea, regular and decaf                             liquids that you cannot  Plain Jell-O any favor except red or purple                                           see through such as: Fruit ices (not with fruit pulp)                                     milk, soups, orange juice  Iced Popsicles                                    All solid food Carbonated beverages, regular and diet                                    Cranberry, grape and apple juices Sports drinks like Gatorade Lightly seasoned clear broth or consume(fat free) Sugar, honey syrup  Sample Menu Breakfast                                Lunch  Supper Cranberry juice                    Beef broth                             Chicken broth Jell-O                                     Grape juice                           Apple juice Coffee or tea                        Jell-O                                      Popsicle                                                Coffee or tea                        Coffee or tea  _____________________________________________________________________    Take these medicines the morning of surgery with A SIP OF WATER:  GABAPENTIN (NUURONTIN), HYDRAZALINE (APRESOLINE), BYSTOLIC, VERAPAMIL (CALAN SR), TAMSULOSIN (FLOMAX), OMEPRAZOLE (PRILOSEC)  DO NOT TAKE ANY DIABETIC MEDICATIONS DAY OF YOUR SURGERY                 TAKE METFORMIN AS USUAL DAY BEFORE SURGERY                 DO NOT TAKE METFORMIN DAY OF SURGERY                  DO NOT TAKE JARDIANCE DAY BEFORE SURGERY                 DO NOT TAKE JARDIANCE DAY OF SURGERY              You may not have any metal on your body including hair pins and              piercings  Do not wear jewelry, make-up, lotions, powders or perfumes, deodorant                         Men may shave face and neck.   Do not bring valuables to the hospital. [place].  Contacts, dentures or bridgework may not be worn into surgery.  Leave suitcase in the car. After surgery it may be brought to your room.                  Please read over the following fact sheets you were given: _____________________________________________________________________             [facility] - Preparing for Surgery Before surgery, you can play an important role.  Because skin is not sterile, your skin needs to be as free  of germs as possible.  You can reduce the number of germs on your skin by washing with CHG (chlorahexidine gluconate) soap before surgery.  CHG is an antiseptic cleaner which kills germs and bonds with the skin to continue killing germs even after washing. Please DO NOT use if  you have an allergy to CHG or antibacterial soaps.  If your skin becomes reddened/irritated stop using the CHG and inform your nurse when you arrive at Short Stay. Do not shave (including legs and underarms) for at least 48 hours prior to the first CHG shower.  You may shave your face/neck. Please follow these instructions carefully:  1.  Shower with CHG Soap the night before surgery and the  morning of Surgery.  2.  If you choose to wash your hair, wash your hair first as usual with your  normal  shampoo.  3.  After you shampoo, rinse your hair and body thoroughly to remove the  shampoo.                           4.  Use CHG as you would any other liquid soap.  You can apply chg directly  to the skin and wash                       Gently with a scrungie or clean washcloth.  5.  Apply the CHG Soap to your body ONLY FROM THE NECK DOWN.   Do not use on face/ open                           Wound or open sores. Avoid contact with eyes, ears mouth and genitals (private parts).                       Wash face,  Genitals (private parts) with your normal soap.             6.  Wash thoroughly, paying special attention to the area where your surgery  will be performed.  7.  Thoroughly rinse your body with warm water from the neck down.  8.  DO NOT shower/wash with your normal soap after using and rinsing off  the CHG Soap.                9.  Pat yourself dry with a clean towel.            10.  Wear clean pajamas.            11.  Place clean sheets on your bed the night of your first shower and do not  sleep with pets. Day of Surgery : Do not apply any lotions/deodorants the morning of surgery.  Please wear clean clothes to the hospital/surgery center.  FAILURE TO FOLLOW THESE INSTRUCTIONS MAY RESULT IN THE CANCELLATION OF YOUR SURGERY PATIENT SIGNATURE_________________________________  NURSE  SIGNATURE__________________________________  ________________________________________________________________________   Omar [name]  An incentive spirometer is a tool that can help keep your lungs clear and active. This tool measures how well you are filling your lungs with each breath. Taking long deep breaths may help reverse or decrease the chance of developing breathing (pulmonary) problems (especially infection) following:  A long period of time when you are unable to move or be active. BEFORE THE PROCEDURE   If the spirometer includes an indicator to show your best effort, your  nurse or respiratory therapist will set it to a desired goal.  If possible, sit up straight or lean slightly forward. Try not to slouch.  Hold the incentive spirometer in an upright position. INSTRUCTIONS FOR USE  1. Sit on the edge of your bed if possible, or sit up as far as you can in bed or on a chair. 2. Hold the incentive spirometer in an upright position. 3. Breathe out normally. 4. Place the mouthpiece in your mouth and seal your lips tightly around it. 5. Breathe in slowly and as deeply as possible, raising the piston or the ball toward the top of the column. 6. Hold your breath for 3-5 seconds or for as long as possible. Allow the piston or ball to fall to the bottom of the column. 7. Remove the mouthpiece from your mouth and breathe out normally. 8. Rest for a few seconds and repeat Steps 1 through 7 at least 10 times every 1-2 hours when you are awake. Take your time and take a few normal breaths between deep breaths. 9. The spirometer may include an indicator to show your best effort. Use the indicator as a goal to work toward during each repetition. 10. After each set of 10 deep breaths, practice coughing to be sure your lungs are clear. If you have an incision (the cut made at the time of surgery), support your incision when coughing by placing a pillow or rolled up towels firmly  against it. Once you are able to get out of bed, walk around indoors and cough well. You may stop using the incentive spirometer when instructed by your caregiver.  RISKS AND COMPLICATIONS  Take your time so you do not get dizzy or light-headed.  If you are in pain, you may need to take or ask for pain medication before doing incentive spirometry. It is harder to take a deep breath if you are having pain. AFTER USE  Rest and breathe slowly and easily.  It can be helpful to keep track of a log of your progress. Your caregiver can provide you with a simple table to help with this. If you are using the spirometer at home, follow these instructions: [place] IF:   You are having difficultly using the spirometer.  You have trouble using the spirometer as often as instructed.  Your pain medication is not giving enough relief while using the spirometer.  You develop fever of 100.5 F (38.1 C) or higher. SEEK IMMEDIATE MEDICAL CARE IF:   You cough up bloody sputum that had not been present before.  You develop fever of 102 F (38.9 C) or greater.  You develop worsening pain at or near the incision site. MAKE SURE YOU:   Understand these instructions.  Will watch your condition.  Will get help right away if you are not doing well or get worse. Document Released: 01/29/2007 Document Revised: 12/11/2011 Document Reviewed: 04/01/2007 [facility] Patient Information 2014 [place], [place].   ________________________________________________________________________

## 2019-06-18 ENCOUNTER — Encounter (HOSPITAL_COMMUNITY): Payer: Self-pay

## 2019-06-18 ENCOUNTER — Encounter (HOSPITAL_COMMUNITY)
Admission: RE | Admit: 2019-06-18 | Discharge: 2019-06-18 | Disposition: A | Discharge status: Home / Self Care | Payer: PPO | Source: Ambulatory Visit | Attending: Orthopedic Surgery | Admitting: Orthopedic Surgery

## 2019-06-18 ENCOUNTER — Other Ambulatory Visit: Payer: Self-pay

## 2019-06-18 DIAGNOSIS — M13851 Other specified arthritis, right hip: Secondary | ICD-10-CM | POA: Insufficient documentation

## 2019-06-18 DIAGNOSIS — Z01812 Encounter for preprocedural laboratory examination: Secondary | ICD-10-CM | POA: Diagnosis not present

## 2019-06-18 LAB — GLUCOSE, CAPILLARY: Glucose-Capillary: 148 mg/dL — ABNORMAL HIGH (ref 70–99)

## 2019-06-18 LAB — BASIC METABOLIC PANEL
Anion gap: 11 (ref 5–15)
BUN: 36 mg/dL — ABNORMAL HIGH (ref 8–23)
CO2: 25 mmol/L (ref 22–32)
Calcium: 9.9 mg/dL (ref 8.9–10.3)
Chloride: 97 mmol/L — ABNORMAL LOW (ref 98–111)
Creatinine, Ser: 1.41 mg/dL — ABNORMAL HIGH (ref 0.61–1.24)
GFR calc Af Amer: 56 mL/min — ABNORMAL LOW (ref >60)
GFR calc non Af Amer: 48 mL/min — ABNORMAL LOW (ref >60)
Glucose, Bld: 155 mg/dL — ABNORMAL HIGH (ref 70–99)
Potassium: 4.1 mmol/L (ref 3.5–5.1)
Sodium: 133 mmol/L — ABNORMAL LOW (ref 135–145)

## 2019-06-18 LAB — CBC
HCT: 43.5 % (ref 39.0–52.0)
Hemoglobin: 14.6 g/dL (ref 13.0–17.0)
MCH: 30.5 pg (ref 26.0–34.0)
MCHC: 33.6 g/dL (ref 30.0–36.0)
MCV: 90.8 fL (ref 80.0–100.0)
Platelets: 300 10*3/uL (ref 150–400)
RBC: 4.79 MIL/uL (ref 4.22–5.81)
RDW: 14.2 % (ref 11.5–15.5)
WBC: 8.7 10*3/uL (ref 4.0–10.5)
nRBC: 0 % (ref 0.0–0.2)

## 2019-06-18 LAB — SURGICAL PCR SCREEN
MRSA, PCR: NEGATIVE
Staphylococcus aureus: POSITIVE — AB

## 2019-06-18 NOTE — Progress Notes (Addendum)
PCP - DR Sinda Du Cardiologist - DR Einar Gip LOV 08-14-18 ON CHART  Chest x-ray - NONE EKG - 08-14-18 DR Einar Gip ON CHART Stress Test - 04-07-19 EPIC ECHO - NONE Cardiac Cath - 06-04-12 PER DR Einar Gip NOTE  Sleep Study - 02-16-16 EPIC CPAP - DID NOT TOLERATE CPAP  Fasting Blood Sugar - NOT SURE Checks Blood Sugar  VERY RARELY  Blood Thinner Instructions: ELIQUIS PATIENT TO CALL DR Mardelle Matte ABOUT STOPPING ELIQUIS INSTRUCTIONS Aspirin Instructions:NONE Last Dose:  Anesthesia review: CHART TO JESSICA ZANETTO PA FOR REVIEW  Patient denies shortness of breath, fever, cough and chest pain at PAT appointment   Patient verbalized understanding of instructions that were given to them at the PAT appointment. Patient was also instructed that they will need to review over the PAT instructions again at home before surgery.

## 2019-06-19 ENCOUNTER — Other Ambulatory Visit: Payer: Self-pay | Admitting: Cardiology

## 2019-06-19 LAB — HEMOGLOBIN A1C
Hgb A1c MFr Bld: 6.9 % — ABNORMAL HIGH (ref 4.8–5.6)
Mean Plasma Glucose: 151 mg/dL

## 2019-06-19 NOTE — Progress Notes (Signed)
Anesthesia Chart Review   Case: [id number] Date/Time: 06/24/19 1313   Procedure: TOTAL HIP ARTHROPLASTY (Right Hip)   Anesthesia type: Choice   Pre-op diagnosis: djd right hip   Location: [place] / [facility] ORS   Surgeon: Marchia Bond, MD      DISCUSSION:73 y.o. never smoker with h/o HTN, HLD, CAD, DVT, chronic anticoagulation (on Eliquis), CVA, DM II, sleep apnea w/o device, right hip DJD scheduled for above procedure 06/24/2019 with Dr. Marchia Bond.   Clearance received from endocrinologist, Dr. Dorris Fetch, on chart, pt optimized for surgery from a medical standpoint only.   Recent stress test 04/07/2019, low risk study.  Cleared by cardiologist, Dr. Einar Gip, to proceed with planned procedure.   Pt reports he has not been given instructions on stopping Eliquis.  Nurse advised him to contact Dr. Luanna Cole office. Voicemail left with Dr. Luanna Cole scheduler.   Anticipate pt can proceed with planned procedure barring acute status change.   VS: BP (!) 148/88   Pulse 94   Temp 37.2 C (Oral)   Resp 18   Ht 5\' 8"  (1.727 m)   Wt 110.9 kg   SpO2 99%   BMI 37.18 kg/m   PROVIDERS: Sinda Du, MD is PCP   Adrian Prows, MD is Cardiologist  LABS: Labs reviewed: Acceptable for surgery. (all labs ordered are listed, but only abnormal results are displayed)  Labs Reviewed  SURGICAL PCR SCREEN - Abnormal; Notable for the following components:      Result Value   Staphylococcus aureus POSITIVE (*)    All other components within normal limits  GLUCOSE, CAPILLARY - Abnormal; Notable for the following components:   Glucose-Capillary 148 (*)    All other components within normal limits  HEMOGLOBIN A1C - Abnormal; Notable for the following components:   Hgb A1c MFr Bld 6.9 (*)    All other components within normal limits  BASIC METABOLIC PANEL - Abnormal; Notable for the following components:   Sodium 133 (*)    Chloride 97 (*)    Glucose, Bld 155 (*)    BUN 36 (*)    Creatinine, Ser 1.41 (*)     GFR calc non Af Amer 48 (*)    GFR calc Af Amer 56 (*)    All other components within normal limits  CBC     IMAGES:   EKG: On chart   CV: Myocardial Perfusion 04/07/2019 Conclusions: Lexiscan stress test was performed. Stress EKG is non-diagnostic, as this is pharmacological stress test. There was baseline artifact as well making EKG difficult to interpret. The perfusion imaging study demonstrates diaphragmatic attenuation artifact in the inferior wall without reversible ischemia or scar. LV systolic function was normal without wall motion abnormality and calculated at 70%. This is a low risk study. Past Medical History:  Diagnosis Date  . Abnormal EKG    Inferior Q waves  . BPH (benign prostatic hyperplasia)   . Chronic anticoagulation 01/16/2017  . Coronary artery disease   . Degenerative joint disease   . Diabetes mellitus, type 2 (HCC)    No insulin  . DVT (deep venous thrombosis) ([place]) 4 -5 yrs ago  . DVT of leg (deep venous thrombosis) ([place]) 07/02/2015  . Heart murmur   . Hepatic steatosis   . Heterozygous factor V Leiden mutation ([place]) 01/16/2017  . Hyperlipidemia   . 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 Meningioma ([place])    Left  frontal; CVA identified by MRI; no neurologic symptoms  . Neuropathy   . Obesity   . Sleep apnea 2008   Dr. Merlene Laughter interpreted the study-CPAP recommended, but refused by patient  . Stroke [facility])    no deficits from stroke, "Didnt know I had one when they say I did".    Past Surgical History:  Procedure Laterality Date  . BIOPSY  04/16/2019   Procedure: BIOPSY;  Surgeon: Danie Binder, MD;  Location: [facility] ENDO SUITE;  Service: Endoscopy;;  . CATARACT EXTRACTION W/PHACO Left 04/03/2016   Procedure: CATARACT EXTRACTION PHACO AND INTRAOCULAR LENS PLACEMENT LEFT EYE CDE=21.76;  Surgeon: Williams Che, MD;  Location: [facility] ORS;  Service: Ophthalmology;  Laterality:  Left;  . CATARACT EXTRACTION W/PHACO Right 06/19/2016   Procedure: CATARACT EXTRACTION PHACO AND INTRAOCULAR LENS PLACEMENT; CDE:  11.56;  Surgeon: Williams Che, MD;  Location: [facility] ORS;  Service: Ophthalmology;  Laterality: Right;  . COLONOSCOPY N/A 04/16/2019   Procedure: COLONOSCOPY;  Surgeon: Danie Binder, MD;  Location: [facility] ENDO SU[facility];  Service: Endoscopy;  Laterality: N/A;  8:30am  . COLONOSCOPY W/ POLYPECTOMY  05/2008   polypectomy x4-adenomatous; internal hemorrhoids  . CORONARY ANGIOPLASTY WITH STENT PLACEMENT  06/04/2012     DES    to RI  . CORONARY STENT PLACEMENT     x 1  . DUPUYTREN / PALMAR FASCIOTOMY  2009   rt hand-dsc-went home same day x2  . ESOPHAGOGASTRODUODENOSCOPY N/A 04/16/2019   Procedure: ESOPHAGOGASTRODUODENOSCOPY (EGD);  Surgeon: Danie Binder, MD;  [facility];  Service: Endoscopy;  Laterality: N/A;  . LEFT HEART CATHETERIZATION WITH CORONARY ANGIOGRAM N/A 05/28/2012   Procedure: LEFT HEART CATHETERIZATION WITH CORONARY ANGIOGRAM;  Surgeon: Laverda Page, MD;  Location[facility] C[facility] LAB;  Service: Cardiovascular;  Laterality: N/A;  . PERCUTANEOUS CORONARY STENT INTERVENTION (PCI-S) N/A 06/04/2012   Procedure: PERCUTANEOUS CORONARY STENT INTERVENTION (PCI-S);  Surgeon: Laverda Page, MD;  Location: [facility] CATH LAB;  Service: Cardiovascular;  Laterality: N/A;  . POLYPECTOMY  04/16/2019   Procedure: POLYPECTOMY;  Surgeon: Danie Binder, MD;  Location: [facility];  Service: Endoscopy;;  . TONSILLECTOMY    . VENTRAL HERNIA REPAIR  2010   umb hernia-[facility]-went home same day    MEDICATIONS: . apixaban (ELIQUIS) 5 MG TABS tablet  . Calcium-Vitamin D-Vitamin K 650-12.5-40 MG-MCG-MCG CHEW  . Dulaglutide (TRULICITY) 1.5 0000000 SOPN  . empagliflozin (JARDIANCE) 25 MG TABS tablet  . gabapentin (NEURONTIN) 100 MG capsule  . hydrALAZINE (APRESOLINE) 50 MG tablet  . lisinopril-hydrochlorothiazide (PRINZIDE,ZESTORETIC) 20-25 MG per tablet  . metFORMIN  (GLUCOPHAGE) 1000 MG tablet  . Multiple Vitamins-Minerals (ONE-A-DAY 50 PLUS PO)  . Nebivolol HCl (BYSTOLIC) 20 MG TABS  . nitroGLYCERIN (NITROSTAT) 0.4 MG SL tablet  . omeprazole (PRILOSEC) 20 MG capsule  . oxyCODONE-acetaminophen (PERCOCET/ROXICET) 5-325 MG tablet  . pravastatin (PRAVACHOL) 40 MG tablet  . spironolactone (ALDACTONE) 50 MG tablet  . tamsulosin (FLOMAX) 0.4 MG CAPS capsule  . torsemide (DEMADEX) 20 MG tablet  . traMADol (ULTRAM) 50 MG tablet  . traZODone (DESYREL) 100 MG tablet  . verapamil (CALAN-SR) 240 MG CR tablet  . vitamin B-12 (CYANOCOBALAMIN) 500 MCG tablet   No current facility-administered medications for this encounter.    Maia Plan [facility] Pre-Surgical Testing [phone] 06/19/19  3:29 PM

## 2019-06-19 NOTE — Anesthesia Preprocedure Evaluation (Addendum)
Anesthesia Evaluation  Patient identified by MRN, date of birth, ID band Patient awake    Reviewed: Allergy & Precautions, NPO status , Patient's Chart, lab work & pertinent test results  Airway Mallampati: II  TM Distance: >3 FB Neck ROM: Full    Dental no notable dental hx.    Pulmonary sleep apnea ,    Pulmonary exam normal breath sounds clear to auscultation       Cardiovascular hypertension, + CAD, + Cardiac Stents, + Peripheral Vascular Disease and + DVT  Normal cardiovascular exam Rhythm:Regular Rate:Normal     Neuro/Psych CVA, No Residual Symptoms negative psych ROS   GI/Hepatic negative GI ROS, Neg liver ROS,   Endo/Other  negative endocrine ROSdiabetes  Renal/GU negative Renal ROS  negative genitourinary   Musculoskeletal negative musculoskeletal ROS (+)   Abdominal   Peds negative pediatric ROS (+)  Hematology negative hematology ROS (+)   Anesthesia Other Findings   Reproductive/Obstetrics negative OB ROS                           Anesthesia Physical Anesthesia Plan  ASA: III  Anesthesia Plan: Spinal   Post-op Pain Management:    Induction: Intravenous  PONV Risk Score and Plan: 1 and Treatment may vary due to age or medical condition  Airway Management Planned: Simple Face Mask  Additional Equipment:   Intra-op Plan:   Post-operative Plan:   Informed Consent: I have reviewed the patients History and Physical, chart, labs and discussed the procedure including the risks, benefits and alternatives for the proposed anesthesia with the patient or authorized representative who has indicated his/her understanding and acceptance.     Dental advisory given  Plan Discussed with: CRNA  Anesthesia Plan Comments: (See PAT note 06/18/2019, Konrad Felix, PA-C)       Anesthesia Quick Evaluation

## 2019-06-20 ENCOUNTER — Other Ambulatory Visit: Payer: Self-pay

## 2019-06-20 ENCOUNTER — Other Ambulatory Visit (HOSPITAL_COMMUNITY)
Admission: RE | Admit: 2019-06-20 | Discharge: 2019-06-20 | Disposition: A | Discharge status: Home / Self Care | Payer: PPO | Source: Ambulatory Visit | Attending: Orthopedic Surgery | Admitting: Orthopedic Surgery

## 2019-06-20 DIAGNOSIS — Z20828 Contact with and (suspected) exposure to other viral communicable diseases: Secondary | ICD-10-CM | POA: Insufficient documentation

## 2019-06-20 DIAGNOSIS — Z01812 Encounter for preprocedural laboratory examination: Secondary | ICD-10-CM | POA: Diagnosis not present

## 2019-06-20 LAB — SARS CORONAVIRUS 2 (TAT 6-24 HRS): SARS Coronavirus 2: NEGATIVE

## 2019-06-24 ENCOUNTER — Inpatient Hospital Stay (HOSPITAL_COMMUNITY): Payer: PPO | Admitting: Registered Nurse

## 2019-06-24 ENCOUNTER — Inpatient Hospital Stay (HOSPITAL_COMMUNITY)
Admission: RE | Admit: 2019-06-24 | Discharge: 2019-06-25 | DRG: 470 | Disposition: A | Discharge status: Home / Self Care | Payer: PPO | Attending: Orthopedic Surgery | Admitting: Orthopedic Surgery

## 2019-06-24 ENCOUNTER — Inpatient Hospital Stay (HOSPITAL_COMMUNITY): Payer: PPO | Admitting: Physician Assistant

## 2019-06-24 ENCOUNTER — Telehealth (HOSPITAL_COMMUNITY): Payer: Self-pay | Admitting: *Deleted

## 2019-06-24 ENCOUNTER — Other Ambulatory Visit: Payer: Self-pay

## 2019-06-24 ENCOUNTER — Encounter (HOSPITAL_COMMUNITY): Payer: Self-pay | Admitting: Emergency Medicine

## 2019-06-24 ENCOUNTER — Encounter (HOSPITAL_COMMUNITY)
Admission: RE | Disposition: A | Discharge status: Home / Self Care | Payer: Self-pay | Source: Home / Self Care | Attending: Orthopedic Surgery

## 2019-06-24 ENCOUNTER — Inpatient Hospital Stay (HOSPITAL_COMMUNITY): Payer: PPO

## 2019-06-24 DIAGNOSIS — E1142 Type 2 diabetes mellitus with diabetic polyneuropathy: Secondary | ICD-10-CM | POA: Diagnosis present

## 2019-06-24 DIAGNOSIS — Z96641 Presence of right artificial hip joint: Secondary | ICD-10-CM

## 2019-06-24 DIAGNOSIS — I10 Essential (primary) hypertension: Secondary | ICD-10-CM | POA: Diagnosis not present

## 2019-06-24 DIAGNOSIS — K760 Fatty (change of) liver, not elsewhere classified: Secondary | ICD-10-CM | POA: Diagnosis not present

## 2019-06-24 DIAGNOSIS — Z7984 Long term (current) use of oral hypoglycemic drugs: Secondary | ICD-10-CM

## 2019-06-24 DIAGNOSIS — Z955 Presence of coronary angioplasty implant and graft: Secondary | ICD-10-CM

## 2019-06-24 DIAGNOSIS — E785 Hyperlipidemia, unspecified: Secondary | ICD-10-CM | POA: Diagnosis present

## 2019-06-24 DIAGNOSIS — N400 Enlarged prostate without lower urinary tract symptoms: Secondary | ICD-10-CM | POA: Diagnosis not present

## 2019-06-24 DIAGNOSIS — E669 Obesity, unspecified: Secondary | ICD-10-CM | POA: Diagnosis present

## 2019-06-24 DIAGNOSIS — Z8673 Personal history of transient ischemic attack (TIA), and cerebral infarction without residual deficits: Secondary | ICD-10-CM | POA: Diagnosis not present

## 2019-06-24 DIAGNOSIS — Z86718 Personal history of other venous thrombosis and embolism: Secondary | ICD-10-CM | POA: Diagnosis not present

## 2019-06-24 DIAGNOSIS — Z79899 Other long term (current) drug therapy: Secondary | ICD-10-CM | POA: Diagnosis not present

## 2019-06-24 DIAGNOSIS — M1611 Unilateral primary osteoarthritis, right hip: Secondary | ICD-10-CM | POA: Diagnosis not present

## 2019-06-24 DIAGNOSIS — Z96649 Presence of unspecified artificial hip joint: Secondary | ICD-10-CM

## 2019-06-24 DIAGNOSIS — Z20828 Contact with and (suspected) exposure to other viral communicable diseases: Secondary | ICD-10-CM | POA: Diagnosis not present

## 2019-06-24 DIAGNOSIS — I2510 Atherosclerotic heart disease of native coronary artery without angina pectoris: Secondary | ICD-10-CM | POA: Diagnosis not present

## 2019-06-24 DIAGNOSIS — Z7901 Long term (current) use of anticoagulants: Secondary | ICD-10-CM

## 2019-06-24 DIAGNOSIS — Z86011 Personal history of benign neoplasm of the brain: Secondary | ICD-10-CM

## 2019-06-24 DIAGNOSIS — Z6837 Body mass index (BMI) 37.0-37.9, adult: Secondary | ICD-10-CM | POA: Diagnosis not present

## 2019-06-24 DIAGNOSIS — G4730 Sleep apnea, unspecified: Secondary | ICD-10-CM | POA: Diagnosis not present

## 2019-06-24 DIAGNOSIS — Z471 Aftercare following joint replacement surgery: Secondary | ICD-10-CM | POA: Diagnosis not present

## 2019-06-24 HISTORY — DX: Unilateral primary osteoarthritis, right hip: M16.11

## 2019-06-24 HISTORY — PX: TOTAL HIP ARTHROPLASTY: SHX124

## 2019-06-24 LAB — GLUCOSE, CAPILLARY
Glucose-Capillary: 207 mg/dL — ABNORMAL HIGH (ref 70–99)
Glucose-Capillary: 130 mg/dL — ABNORMAL HIGH (ref 70–99)

## 2019-06-24 SURGERY — ARTHROPLASTY, HIP, TOTAL,POSTERIOR APPROACH
Anesthesia: Spinal | Site: Hip | Laterality: Right

## 2019-06-24 MED ORDER — PROPOFOL 10 MG/ML IV BOLUS
INTRAVENOUS | Status: AC
  Filled 2019-06-24: qty 20

## 2019-06-24 MED ORDER — CALCIUM CARBONATE-VITAMIN D 500-200 MG-UNIT PO TABS
2.0000 | ORAL_TABLET | Freq: Every day | ORAL | Status: DC
  Administered 2019-06-24: 2 via ORAL
  Filled 2019-06-24: qty 2

## 2019-06-24 MED ORDER — DEXAMETHASONE SODIUM PHOSPHATE 10 MG/ML IJ SOLN
10.0000 mg | Freq: Once | INTRAMUSCULAR | Status: AC
  Administered 2019-06-25: 11:00:00 10 mg via INTRAVENOUS
  Filled 2019-06-24: qty 1

## 2019-06-24 MED ORDER — TRANEXAMIC ACID-NACL 1000-0.7 MG/100ML-% IV SOLN
1000.0000 mg | INTRAVENOUS | Status: DC
  Filled 2019-06-24: qty 100

## 2019-06-24 MED ORDER — SENNA-DOCUSATE SODIUM 8.6-50 MG PO TABS: 2.0000 | ORAL_TABLET | Freq: Every day | ORAL | 1 refills | Status: DC

## 2019-06-24 MED ORDER — ACETAMINOPHEN 500 MG PO TABS
1000.0000 mg | ORAL_TABLET | Freq: Four times a day (QID) | ORAL | Status: AC
  Administered 2019-06-24 – 2019-06-25 (×4): 1000 mg via ORAL
  Filled 2019-06-24 (×4): qty 2

## 2019-06-24 MED ORDER — CYANOCOBALAMIN 500 MCG PO TABS
500.0000 ug | ORAL_TABLET | Freq: Every day | ORAL | Status: DC
  Administered 2019-06-24 – 2019-06-25 (×2): 500 ug via ORAL
  Filled 2019-06-24 (×2): qty 1

## 2019-06-24 MED ORDER — MENTHOL 3 MG MT LOZG: 1.0000 | LOZENGE | OROMUCOSAL | Status: DC | PRN

## 2019-06-24 MED ORDER — SODIUM CHLORIDE 0.9 % IV SOLN
INTRAVENOUS | Status: DC | PRN
  Administered 2019-06-24: 14:00:00 25 ug/min via INTRAVENOUS

## 2019-06-24 MED ORDER — MIDAZOLAM HCL 2 MG/2ML IJ SOLN
INTRAMUSCULAR | Status: AC
  Filled 2019-06-24: qty 2

## 2019-06-24 MED ORDER — HYDRALAZINE HCL 50 MG PO TABS
50.0000 mg | ORAL_TABLET | Freq: Three times a day (TID) | ORAL | Status: DC
  Filled 2019-06-24: qty 1

## 2019-06-24 MED ORDER — BUPIVACAINE IN DEXTROSE 0.75-8.25 % IT SOLN
INTRATHECAL | Status: DC | PRN
  Administered 2019-06-24: 1.8 mL via INTRATHECAL

## 2019-06-24 MED ORDER — GABAPENTIN 100 MG PO CAPS
100.0000 mg | ORAL_CAPSULE | Freq: Two times a day (BID) | ORAL | Status: DC
  Administered 2019-06-24 – 2019-06-25 (×2): 100 mg via ORAL
  Filled 2019-06-24 (×2): qty 1

## 2019-06-24 MED ORDER — MAGNESIUM CITRATE PO SOLN: 1.0000 | Freq: Once | ORAL | Status: DC | PRN

## 2019-06-24 MED ORDER — STERILE WATER FOR IRRIGATION IR SOLN
Status: DC | PRN
  Administered 2019-06-24 (×2): 1000 mL

## 2019-06-24 MED ORDER — METOCLOPRAMIDE HCL 5 MG/ML IJ SOLN: 5.0000 mg | Freq: Three times a day (TID) | INTRAMUSCULAR | Status: DC | PRN

## 2019-06-24 MED ORDER — OXYCODONE HCL 5 MG PO TABS: 10.0000 mg | ORAL_TABLET | ORAL | Status: DC | PRN

## 2019-06-24 MED ORDER — MEPERIDINE HCL 50 MG/ML IJ SOLN: 6.2500 mg | INTRAMUSCULAR | Status: DC | PRN

## 2019-06-24 MED ORDER — BUPIVACAINE HCL (PF) 0.25 % IJ SOLN
INTRAMUSCULAR | Status: AC
  Filled 2019-06-24: qty 30

## 2019-06-24 MED ORDER — POTASSIUM CHLORIDE IN NACL 20-0.45 MEQ/L-% IV SOLN
INTRAVENOUS | Status: DC
  Administered 2019-06-24: 21:00:00 via INTRAVENOUS
  Filled 2019-06-24 (×3): qty 1000

## 2019-06-24 MED ORDER — DIPHENHYDRAMINE HCL 12.5 MG/5ML PO ELIX: 12.5000 mg | ORAL_SOLUTION | ORAL | Status: DC | PRN

## 2019-06-24 MED ORDER — BISACODYL 10 MG RE SUPP: 10.0000 mg | Freq: Every day | RECTAL | Status: DC | PRN

## 2019-06-24 MED ORDER — PRAVASTATIN SODIUM 20 MG PO TABS
40.0000 mg | ORAL_TABLET | Freq: Every day | ORAL | Status: DC
  Administered 2019-06-24: 23:00:00 40 mg via ORAL
  Filled 2019-06-24: qty 2

## 2019-06-24 MED ORDER — ACETAMINOPHEN 325 MG PO TABS: 325.0000 mg | ORAL_TABLET | Freq: Four times a day (QID) | ORAL | Status: DC | PRN

## 2019-06-24 MED ORDER — KETOROLAC TROMETHAMINE 15 MG/ML IJ SOLN
7.5000 mg | Freq: Four times a day (QID) | INTRAMUSCULAR | Status: AC
  Administered 2019-06-24 – 2019-06-25 (×4): 7.5 mg via INTRAVENOUS
  Filled 2019-06-24 (×4): qty 1

## 2019-06-24 MED ORDER — PROPOFOL 500 MG/50ML IV EMUL
INTRAVENOUS | Status: DC | PRN
  Administered 2019-06-24: 75 ug/kg/min via INTRAVENOUS

## 2019-06-24 MED ORDER — ZOLPIDEM TARTRATE 5 MG PO TABS: 5.0000 mg | ORAL_TABLET | Freq: Every evening | ORAL | Status: DC | PRN

## 2019-06-24 MED ORDER — FENTANYL CITRATE (PF) 100 MCG/2ML IJ SOLN
INTRAMUSCULAR | Status: AC
  Filled 2019-06-24: qty 2

## 2019-06-24 MED ORDER — METHOCARBAMOL 500 MG IVPB - SIMPLE MED
500.0000 mg | Freq: Four times a day (QID) | INTRAVENOUS | Status: DC | PRN
  Filled 2019-06-24: qty 50

## 2019-06-24 MED ORDER — TAMSULOSIN HCL 0.4 MG PO CAPS
0.4000 mg | ORAL_CAPSULE | Freq: Every day | ORAL | Status: DC
  Administered 2019-06-25: 11:00:00 0.4 mg via ORAL
  Filled 2019-06-24: qty 1

## 2019-06-24 MED ORDER — METHOCARBAMOL 500 MG PO TABS
500.0000 mg | ORAL_TABLET | Freq: Four times a day (QID) | ORAL | Status: DC | PRN
  Administered 2019-06-25: 06:00:00 500 mg via ORAL
  Filled 2019-06-24: qty 1

## 2019-06-24 MED ORDER — MIDAZOLAM HCL 5 MG/5ML IJ SOLN
INTRAMUSCULAR | Status: DC | PRN
  Administered 2019-06-24: 1 mg via INTRAVENOUS

## 2019-06-24 MED ORDER — PANTOPRAZOLE SODIUM 40 MG PO TBEC
40.0000 mg | DELAYED_RELEASE_TABLET | Freq: Every day | ORAL | Status: DC
  Filled 2019-06-24: qty 1

## 2019-06-24 MED ORDER — SPIRONOLACTONE 25 MG PO TABS
50.0000 mg | ORAL_TABLET | Freq: Every day | ORAL | Status: DC
  Administered 2019-06-25: 11:00:00 50 mg via ORAL
  Filled 2019-06-24: qty 2

## 2019-06-24 MED ORDER — OXYCODONE HCL 5 MG PO TABS
5.0000 mg | ORAL_TABLET | ORAL | Status: DC | PRN
  Administered 2019-06-24: 5 mg via ORAL
  Administered 2019-06-24: 10 mg via ORAL
  Filled 2019-06-24 (×2): qty 2
  Filled 2019-06-24: qty 1

## 2019-06-24 MED ORDER — APIXABAN 5 MG PO TABS
5.0000 mg | ORAL_TABLET | Freq: Two times a day (BID) | ORAL | Status: DC
  Administered 2019-06-25: 11:00:00 5 mg via ORAL
  Filled 2019-06-24: qty 1

## 2019-06-24 MED ORDER — TORSEMIDE 20 MG PO TABS
20.0000 mg | ORAL_TABLET | Freq: Every day | ORAL | Status: DC
  Filled 2019-06-24 (×2): qty 1

## 2019-06-24 MED ORDER — PROPOFOL 10 MG/ML IV BOLUS
INTRAVENOUS | Status: DC | PRN
  Administered 2019-06-24: 20 mg via INTRAVENOUS

## 2019-06-24 MED ORDER — CALCIUM-VITAMIN D-VITAMIN K 650-12.5-40 MG-MCG-MCG PO CHEW: 2.0000 | CHEWABLE_TABLET | Freq: Every day | ORAL | Status: DC

## 2019-06-24 MED ORDER — FENTANYL CITRATE (PF) 100 MCG/2ML IJ SOLN
INTRAMUSCULAR | Status: DC | PRN
  Administered 2019-06-24: 50 ug via INTRAVENOUS

## 2019-06-24 MED ORDER — DEXAMETHASONE SODIUM PHOSPHATE 10 MG/ML IJ SOLN
INTRAMUSCULAR | Status: AC
  Filled 2019-06-24: qty 1

## 2019-06-24 MED ORDER — SODIUM CHLORIDE 0.9 % IR SOLN
Status: DC | PRN
  Administered 2019-06-24: 1000 mL

## 2019-06-24 MED ORDER — ALUM & MAG HYDROXIDE-SIMETH 200-200-20 MG/5ML PO SUSP: 30.0000 mL | ORAL | Status: DC | PRN

## 2019-06-24 MED ORDER — NEBIVOLOL HCL 10 MG PO TABS
20.0000 mg | ORAL_TABLET | Freq: Every morning | ORAL | Status: DC
  Filled 2019-06-24: qty 2

## 2019-06-24 MED ORDER — OXYCODONE HCL 5 MG PO TABS: 5.0000 mg | ORAL_TABLET | ORAL | 0 refills | Status: DC | PRN

## 2019-06-24 MED ORDER — LISINOPRIL 20 MG PO TABS
20.0000 mg | ORAL_TABLET | Freq: Every day | ORAL | Status: DC
  Filled 2019-06-24: qty 1

## 2019-06-24 MED ORDER — ONDANSETRON HCL 4 MG/2ML IJ SOLN
INTRAMUSCULAR | Status: AC
  Filled 2019-06-24: qty 2

## 2019-06-24 MED ORDER — BACLOFEN 10 MG PO TABS: 10.0000 mg | ORAL_TABLET | Freq: Three times a day (TID) | ORAL | 0 refills | Status: DC

## 2019-06-24 MED ORDER — CHLORHEXIDINE GLUCONATE 4 % EX LIQD: 60.0000 mL | Freq: Once | CUTANEOUS | Status: DC

## 2019-06-24 MED ORDER — PROPOFOL 10 MG/ML IV BOLUS
INTRAVENOUS | Status: AC
  Filled 2019-06-24: qty 60

## 2019-06-24 MED ORDER — VERAPAMIL HCL ER 240 MG PO TBCR
240.0000 mg | EXTENDED_RELEASE_TABLET | Freq: Every day | ORAL | Status: DC
  Filled 2019-06-24: qty 1

## 2019-06-24 MED ORDER — METOCLOPRAMIDE HCL 5 MG/ML IJ SOLN: 10.0000 mg | Freq: Once | INTRAMUSCULAR | Status: DC | PRN

## 2019-06-24 MED ORDER — EPHEDRINE SULFATE 50 MG/ML IJ SOLN
INTRAMUSCULAR | Status: DC | PRN
  Administered 2019-06-24 (×4): 5 mg via INTRAVENOUS

## 2019-06-24 MED ORDER — ONDANSETRON HCL 4 MG/2ML IJ SOLN: 4.0000 mg | Freq: Four times a day (QID) | INTRAMUSCULAR | Status: DC | PRN

## 2019-06-24 MED ORDER — ONDANSETRON HCL 4 MG PO TABS: 4.0000 mg | ORAL_TABLET | Freq: Four times a day (QID) | ORAL | Status: DC | PRN

## 2019-06-24 MED ORDER — FENTANYL CITRATE (PF) 100 MCG/2ML IJ SOLN: 25.0000 ug | INTRAMUSCULAR | Status: DC | PRN

## 2019-06-24 MED ORDER — HYDROCHLOROTHIAZIDE 25 MG PO TABS
25.0000 mg | ORAL_TABLET | Freq: Every day | ORAL | Status: DC
  Filled 2019-06-24: qty 1

## 2019-06-24 MED ORDER — EPHEDRINE 5 MG/ML INJ
INTRAVENOUS | Status: AC
  Filled 2019-06-24: qty 10

## 2019-06-24 MED ORDER — KETOROLAC TROMETHAMINE 30 MG/ML IJ SOLN
INTRAMUSCULAR | Status: DC | PRN
  Administered 2019-06-24: 30 mg

## 2019-06-24 MED ORDER — KETOROLAC TROMETHAMINE 30 MG/ML IJ SOLN
INTRAMUSCULAR | Status: AC
  Filled 2019-06-24: qty 1

## 2019-06-24 MED ORDER — TRAZODONE HCL 100 MG PO TABS
100.0000 mg | ORAL_TABLET | Freq: Every day | ORAL | Status: DC
  Administered 2019-06-24: 23:00:00 100 mg via ORAL
  Filled 2019-06-24: qty 1

## 2019-06-24 MED ORDER — CANAGLIFLOZIN 100 MG PO TABS
100.0000 mg | ORAL_TABLET | Freq: Every day | ORAL | Status: DC
  Administered 2019-06-25: 100 mg via ORAL
  Filled 2019-06-24: qty 1

## 2019-06-24 MED ORDER — ONDANSETRON HCL 4 MG/2ML IJ SOLN
INTRAMUSCULAR | Status: DC | PRN
  Administered 2019-06-24: 4 mg via INTRAVENOUS

## 2019-06-24 MED ORDER — DEXAMETHASONE SODIUM PHOSPHATE 10 MG/ML IJ SOLN
INTRAMUSCULAR | Status: DC | PRN
  Administered 2019-06-24: 10 mg via INTRAVENOUS

## 2019-06-24 MED ORDER — LACTATED RINGERS IV SOLN
INTRAVENOUS | Status: DC
  Administered 2019-06-24 (×3): via INTRAVENOUS

## 2019-06-24 MED ORDER — LISINOPRIL-HYDROCHLOROTHIAZIDE 20-25 MG PO TABS: 1.0000 | ORAL_TABLET | Freq: Every day | ORAL | Status: DC

## 2019-06-24 MED ORDER — BUPIVACAINE HCL (PF) 0.25 % IJ SOLN
INTRAMUSCULAR | Status: DC | PRN
  Administered 2019-06-24: 30 mL

## 2019-06-24 MED ORDER — POLYETHYLENE GLYCOL 3350 17 G PO PACK: 17.0000 g | PACK | Freq: Every day | ORAL | Status: DC | PRN

## 2019-06-24 MED ORDER — NITROGLYCERIN 0.4 MG SL SUBL: 0.4000 mg | SUBLINGUAL_TABLET | SUBLINGUAL | Status: DC | PRN

## 2019-06-24 MED ORDER — METFORMIN HCL 500 MG PO TABS
1000.0000 mg | ORAL_TABLET | Freq: Two times a day (BID) | ORAL | Status: DC
  Administered 2019-06-25: 1000 mg via ORAL
  Filled 2019-06-24: qty 2

## 2019-06-24 MED ORDER — METOCLOPRAMIDE HCL 5 MG PO TABS: 5.0000 mg | ORAL_TABLET | Freq: Three times a day (TID) | ORAL | Status: DC | PRN

## 2019-06-24 MED ORDER — POVIDONE-IODINE 10 % EX SWAB
2.0000 "application " | Freq: Once | CUTANEOUS | Status: AC
  Administered 2019-06-24: 2 via TOPICAL

## 2019-06-24 MED ORDER — ONDANSETRON HCL 4 MG PO TABS: 4.0000 mg | ORAL_TABLET | Freq: Three times a day (TID) | ORAL | 0 refills | Status: DC | PRN

## 2019-06-24 MED ORDER — PHENOL 1.4 % MT LIQD: 1.0000 | OROMUCOSAL | Status: DC | PRN

## 2019-06-24 MED ORDER — PHENYLEPHRINE HCL (PRESSORS) 10 MG/ML IV SOLN
INTRAVENOUS | Status: AC
  Filled 2019-06-24: qty 1

## 2019-06-24 MED ORDER — HYDROMORPHONE HCL 1 MG/ML IJ SOLN: 0.5000 mg | INTRAMUSCULAR | Status: DC | PRN

## 2019-06-24 MED ORDER — CEFAZOLIN SODIUM-DEXTROSE 2-4 GM/100ML-% IV SOLN
2.0000 g | Freq: Four times a day (QID) | INTRAVENOUS | Status: AC
  Administered 2019-06-24 – 2019-06-25 (×2): 2 g via INTRAVENOUS
  Filled 2019-06-24 (×2): qty 100

## 2019-06-24 MED ORDER — DOCUSATE SODIUM 100 MG PO CAPS
100.0000 mg | ORAL_CAPSULE | Freq: Two times a day (BID) | ORAL | Status: DC
  Administered 2019-06-24: 100 mg via ORAL
  Filled 2019-06-24 (×2): qty 1

## 2019-06-24 MED ORDER — CEFAZOLIN SODIUM-DEXTROSE 2-4 GM/100ML-% IV SOLN
2.0000 g | INTRAVENOUS | Status: AC
  Administered 2019-06-24: 2 g via INTRAVENOUS
  Filled 2019-06-24: qty 100

## 2019-06-24 SURGICAL SUPPLY — 60 items
ACETAB CUP W GRIPTION 54MM (Plate) ×1 IMPLANT
ACETAB CUP W/GRIPTION 54 (Plate) ×2 IMPLANT
ARTICULEZE HEAD (Hips) ×3 IMPLANT
BIT DRILL 2.0X128 (BIT) ×2 IMPLANT
BIT DRILL 2.0X128MM (BIT) ×1
BLADE SAW SAG 73X25 THK (BLADE) ×1
BLADE SAW SGTL 73X25 THK (BLADE) ×2 IMPLANT
BLADE SURG SZ10 CARB STEEL (BLADE) ×6 IMPLANT
CLOSURE STERI-STRIP 1/2X4 (GAUZE/BANDAGES/DRESSINGS) ×1
CLSR STERI-STRIP ANTIMIC 1/2X4 (GAUZE/BANDAGES/DRESSINGS) ×3 IMPLANT
COVER SURGICAL LIGHT HANDLE (MISCELLANEOUS) ×3 IMPLANT
COVER WAND RF STERILE (DRAPES) IMPLANT
CUP ACETAB W/GRIPTION 54 (Plate) IMPLANT
DRAPE INCISE IOBAN 66X45 STRL (DRAPES) ×3 IMPLANT
DRAPE ORTHO SPLIT 77X108 STRL (DRAPES) ×6
DRAPE POUCH INSTRU U-SHP 10X18 (DRAPES) ×3 IMPLANT
DRAPE SHEET LG 3/4 BI-LAMINATE (DRAPES) ×3 IMPLANT
DRAPE SURG 17X11 SM STRL (DRAPES) ×3 IMPLANT
DRAPE SURG ORHT 6 SPLT 77X108 (DRAPES) ×2 IMPLANT
DRAPE U-SHAPE 47X51 STRL (DRAPES) ×3 IMPLANT
DRSG MEPILEX BORDER 4X12 (GAUZE/BANDAGES/DRESSINGS) ×2 IMPLANT
DRSG MEPILEX BORDER 4X8 (GAUZE/BANDAGES/DRESSINGS) ×3 IMPLANT
DURAPREP 26ML APPLICATOR (WOUND CARE) ×6 IMPLANT
ELECT BLADE TIP CTD 4 INCH (ELECTRODE) ×3 IMPLANT
ELECT REM PT RETURN 15FT ADLT (MISCELLANEOUS) ×3 IMPLANT
ELIMINATOR HOLE APEX DEPUY (Hips) ×2 IMPLANT
GLOVE BIO SURGEON STRL SZ7.5 (GLOVE) ×3 IMPLANT
GLOVE BIO SURGEON STRL SZ8 (GLOVE) ×3 IMPLANT
GLOVE BIOGEL PI IND STRL 8 (GLOVE) ×2 IMPLANT
GLOVE BIOGEL PI INDICATOR 8 (GLOVE) ×4
GOWN STRL REUS W/TWL 2XL LVL3 (GOWN DISPOSABLE) ×3 IMPLANT
GOWN STRL REUS W/TWL LRG LVL3 (GOWN DISPOSABLE) ×3 IMPLANT
HEAD ARTICULEZE (Hips) IMPLANT
HOOD PEEL AWAY FLYTE STAYCOOL (MISCELLANEOUS) ×9 IMPLANT
KIT BASIN OR (CUSTOM PROCEDURE TRAY) ×3 IMPLANT
KIT TURNOVER KIT A (KITS) IMPLANT
LINER NEUTRAL 54X36MM PLUS 4 (Hips) ×2 IMPLANT
MANIFOLD NEPTUNE II (INSTRUMENTS) ×3 IMPLANT
NEEDLE HYPO 22GX1.5 SAFETY (NEEDLE) ×3 IMPLANT
NS IRRIG 1000ML POUR BTL (IV SOLUTION) ×3 IMPLANT
PACK TOTAL JOINT (CUSTOM PROCEDURE TRAY) ×3 IMPLANT
PROTECTOR NERVE ULNAR (MISCELLANEOUS) ×3 IMPLANT
RETRIEVER SUT HEWSON (MISCELLANEOUS) ×3 IMPLANT
SCREW 6.5MMX25MM (Screw) ×2 IMPLANT
STEM FEM CMT SUMMIT SZ5X130 (Stem) ×2 IMPLANT
SUCTION FRAZIER HANDLE 12FR (TUBING) ×2
SUCTION TUBE FRAZIER 12FR DISP (TUBING) ×1 IMPLANT
SUT FIBERWIRE #2 38 REV NDL BL (SUTURE) ×9
SUT MNCRL AB 4-0 PS2 18 (SUTURE) IMPLANT
SUT VIC AB 0 CT1 36 (SUTURE) ×3 IMPLANT
SUT VIC AB 2-0 CT1 27 (SUTURE) ×6
SUT VIC AB 2-0 CT1 TAPERPNT 27 (SUTURE) ×2 IMPLANT
SUT VIC AB 3-0 SH 8-18 (SUTURE) ×3 IMPLANT
SUTURE FIBERWR#2 38 REV NDL BL (SUTURE) ×3 IMPLANT
SYR CONTROL 10ML LL (SYRINGE) ×3 IMPLANT
TOWEL OR 17X26 10 PK STRL BLUE (TOWEL DISPOSABLE) ×6 IMPLANT
TOWEL OR NON WOVEN STRL DISP B (DISPOSABLE) ×3 IMPLANT
TRAY FOLEY MTR SLVR 16FR STAT (SET/KITS/TRAYS/PACK) ×3 IMPLANT
WATER STERILE IRR 1000ML POUR (IV SOLUTION) ×6 IMPLANT
YANKAUER SUCT BULB TIP 10FT TU (MISCELLANEOUS) ×3 IMPLANT

## 2019-06-24 NOTE — Anesthesia Procedure Notes (Signed)
Procedure Name: MAC Date/Time: 06/24/2019 1:04 PM Performed by: Lissa Morales, CRNA Pre-anesthesia Checklist: Patient identified, Emergency Drugs available, Suction available, Patient being monitored and Timeout performed Patient Re-evaluated:Patient Re-evaluated prior to induction Oxygen Delivery Method: Simple face mask Placement Confirmation: positive ETCO2

## 2019-06-24 NOTE — Anesthesia Postprocedure Evaluation (Signed)
Anesthesia Post Note  Patient: Omar [name]  Procedure(s) Performed: TOTAL HIP ARTHROPLASTY (Right Hip)     Patient location during evaluation: PACU Anesthesia Type: Spinal Level of consciousness: awake and alert Pain management: pain level controlled Vital Signs Assessment: post-procedure vital signs reviewed and stable Respiratory status: spontaneous breathing and respiratory function stable Cardiovascular status: blood pressure returned to baseline and stable Postop Assessment: no headache, no backache, spinal receding and no apparent nausea or vomiting Anesthetic complications: no    Last Vitals:  Vitals:   06/24/19 1730 06/24/19 1735  BP:  (!) 103/56  Pulse: 64 (!) 46  Resp: 16 14  Temp:  (!) 36.4 C  SpO2: 96% 100%    Last Pain:  Vitals:   06/24/19 1715  TempSrc:   PainSc: 0-No pain                 Montez Hageman

## 2019-06-24 NOTE — Op Note (Signed)
06/24/2019  3:16 PM  PATIENT:  Omar [name]   MRN: [id number]  PRE-OPERATIVE DIAGNOSIS:  Right hip primary localized osteoarthritis  POST-OPERATIVE DIAGNOSIS:  same  PROCEDURE:  Procedure(s): RIGHT TOTAL HIP ARTHROPLASTY  PREOPERATIVE INDICATIONS:    Omar [name] is an 73 y.o. male who has a diagnosis of right hip primary localized osteoarthritis and elected for surgical management after failing conservative treatment.  The risks benefits and alternatives were discussed with the patient including but not limited to the risks of nonoperative treatment, versus surgical intervention including infection, bleeding, nerve injury, periprosthetic fracture, the need for revision surgery, dislocation, leg length discrepancy, blood clots, cardiopulmonary complications, morbidity, mortality, among others, and they were willing to proceed.    He has multiple risk factors including his previous blood clots, factor V Leiden, cardiac events, among others.   OPERATIVE REPORT     SURGEON:  Marchia Bond, MD    ASSISTANT:  Joya Gaskins, OPA-C  (Present throughout the entire procedure,  necessary for completion of procedure in a timely manner, assisting with retraction, instrumentation, and closure)     ANESTHESIA: Spinal  ESTIMATED BLOOD LOSS: 938 mL    COMPLICATIONS:  None.     UNIQUE ASPECTS OF THE CASE: The hip was extremely contracted, and had no rotation during examination under anesthesia.  He had substantial osteophyte formation around the acetabulum, and I had some osteophyte still present inferiorly posteriorly as well as inferiorly anteriorly.  I medialized the cup somewhat, but not all the way down, kept low, he was stable during exam under anesthesia after the implants were in, I did not feel that I needed a lipped liner.  His femoral geometry was a little bit unusual, I ended up using a 5, the soft tissues did not really even fully come back to the trochanter.  The +1.5 neck was  definitely too short, high-offset would have made it totally unrepairable, and I needed the +5 for appropriate stability.  COMPONENTS:  Depuy tapered Porocoat size 5 standard with a 36 mm + 5 metallic head ball and a Gription Acetabular shell size 54, with a single cancellous screw for backup fixation, with an apex hole eliminator and a +4 neutral polyethylene liner.    PROCEDURE IN DETAIL:   The patient was met in the holding area and  identified.  The appropriate hip was identified and marked at the operative site.  The patient was then transported to the OR  and  placed under anesthesia.  At that point, the patient was  placed in the lateral decubitus position with the operative side up and  secured to the operating room table and all bony prominences padded.     The operative lower extremity was prepped from the iliac crest to the distal leg.  Sterile draping was performed.  Time out was performed prior to incision.      A routine posterolateral approach was utilized via sharp dissection  carried down to the subcutaneous tissue.  Gross bleeders were Bovie coagulated.  The iliotibial band was identified and incised along the length of the skin incision.  Self-retaining retractors were  inserted.  With the hip internally rotated, the short external rotators  were identified. The piriformis and capsule was tagged with FiberWire, and the hip capsule released in a T-type fashion.  The femoral neck was exposed, and I resected the femoral neck using the appropriate jig. This was performed at just less than a thumb's breadth above the lesser trochanter.  I then exposed the deep acetabulum, cleared out any tissue including the ligamentum teres.  A wing retractor was placed.  After adequate visualization, I excised the labrum, and then sequentially reamed.  I placed the trial acetabulum, which seated nicely, and then impacted the real cup into place.  Appropriate version and inclination was confirmed  clinically matching their bony anatomy, and also with the use of the jig.  I placed a cancellous screw to augment fixation.  A trial polyethylene liner was placed and the wing retractor removed.    I then prepared the proximal femur using the cookie-cutter, the lateralizing reamer, and then sequentially reamed and broached.  A trial broach, neck, and head was utilized, and I reduced the hip and it was found to have excellent stability with functional range of motion.  A +1.5 was too short, the +5 restored appropriate leg length although the soft tissues were contracted.  The trial components were then removed, and the real polyethylene liner was placed.  I then impacted the real femoral prosthesis into place into the appropriate version, slightly anteverted to the normal anatomy, and I impacted the real head ball into place. The hip was then reduced and taken through functional range of motion and found to have excellent stability. Leg lengths were restored.  I then used a 2 mm drill bits to pass the FiberWire suture from the capsule and piriformis through the greater trochanter, and secured this. Excellent posterior capsular repair was achieved. I also closed the T in the capsule.  I then irrigated the hip copiously again with pulse lavage, and repaired the fascia with Vicryl, followed by Vicryl for the subcutaneous tissue, Monocryl for the skin, Steri-Strips and sterile gauze. The wounds were injected. The patient was then awakened and returned to PACU in stable and satisfactory condition. There were no complications.  Marchia Bond, MD Orthopedic Surgeon [phone]   06/24/2019 3:16 PM

## 2019-06-24 NOTE — Discharge Instructions (Signed)

## 2019-06-24 NOTE — H&P (Signed)
PREOPERATIVE H&P  Chief Complaint: right hip pain  HPI: Omar [name] is a 73 y.o. male who presents for preoperative history and physical with a diagnosis of right hip osteoarthritis. Symptoms are rated as moderate to severe, and have been worsening.  This is significantly impairing activities of daily living.  He has elected for surgical management.   Past Medical History:  Diagnosis Date  . Abnormal EKG    Inferior Q waves  . BPH (benign prostatic hyperplasia)   . Chronic anticoagulation 01/16/2017  . Coronary artery disease   . Degenerative joint disease   . Diabetes mellitus, type 2 (HCC)    No insulin  . DVT (deep venous thrombosis) ([place]) 4 -5 yrs ago  . DVT of leg (deep venous thrombosis) ([place]) 07/02/2015  . Heart murmur   . Hepatic steatosis   . Heterozygous factor V Leiden mutation ([place]) 01/16/2017  . Hyperlipidemia   . 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 Meningioma (HCC)    Left frontal; CVA identified by MRI; no neurologic symptoms  . Neuropathy   . Obesity   . Sleep apnea 2008   Dr. Merlene Laughter interpreted the study-CPAP recommended, but refused by patient  . Stroke [facility])    no deficits from stroke, "Didnt know I had one when they say I did".   Past Surgical History:  Procedure Laterality Date  . BIOPSY  04/16/2019   Procedure: BIOPSY;  Surgeon: Danie Binder, MD;  Location: [facility];  Service: Endoscopy;;  . CATARACT EXTRACTION W/PHACO Left 04/03/2016   Procedure: CATARACT EXTRACTION PHACO AND INTRAOCULAR LENS PLACEMENT LEFT EYE CDE=21.76;  Surgeon: Williams Che, MD;  Location: [facility] ORS;  Service: Ophthalmology;  Laterality: Left;  . CATARACT EXTRACTION W/PHACO Right 06/19/2016   Procedure: CATARACT EXTRACTION PHACO AND INTRAOCULAR LENS PLACEMENT; CDE:  11.56;  Surgeon: Williams Che, MD;  Location: [facility];  Service: Ophthalmology;  Laterality: Right;  . COLONOSCOPY N/A 04/16/2019    Procedure: COLONOSCOPY;  Surgeon: Danie Binder, MD;  Location: [facility];  Service: Endoscopy;  Laterality: N/A;  8:30am  . COLONOSCOPY W/ POLYPECTOMY  05/2008   polypectomy x4-adenomatous; internal hemorrhoids  . CORONARY ANGIOPLASTY WITH STENT PLACEMENT  06/04/2012     DES    to RI  . CORONARY STENT PLACEMENT     x 1  . DUPUYTREN / PALMAR FASCIOTOMY  2009   rt hand-dsc-went home same day x2  . ESOPHAGOGASTRODUODENOSCOPY N/A 04/16/2019   Procedure: ESOPHAGOGASTRODUODENOSCOPY (EGD);  Surgeon: Danie Binder, MD;  [facility];  Service: Endoscopy;  Laterality: N/A;  . LEFT HEART CATHETERIZATION WITH CORONARY ANGIOGRAM N/A 05/28/2012   Procedure: LEFT HEART CATHETERIZATION WITH CORONARY ANGIOGRAM;  Surgeon: Laverda Page, MD;  Location: [facility] CATH LAB;  Service: Cardiovascular;  Laterality: N/A;  . PERCUTANEOUS CORONARY STENT INTERVENTION (PCI-S) N/A 06/04/2012   Procedure: PERCUTANEOUS CORONARY STENT INTERVENTION (PCI-S);  Surgeon: Laverda Page, MD;  Location: [facility] CATH LAB;  Service: Cardiovascular;  Laterality: N/A;  . POLYPECTOMY  04/16/2019   Procedure: POLYPECTOMY;  Surgeon: Danie Binder, MD;  Location: [facility];  Service: Endoscopy;;  . TONSILLECTOMY    . VENTRAL HERNIA REPAIR  2010   umb hernia-[facility]-went home same day   Social History   Socioeconomic History  . Marital status: Married    Spouse name: Not on file  . Number of children: Not on file  .  Years of education: Not on file  . Highest education level: Not on file  Occupational History  . Occupation: Games developer    Comment: Research officer, trade union  Social Needs  . Financial resource strain: Not on file  . Food insecurity    Worry: Not on file    Inability: Not on file  . Transportation needs    Medical: Not on file    Non-medical: Not on file  Tobacco Use  . Smoking status: Never Smoker  . Smokeless tobacco: Never Used  Substance and Sexual Activity  . Alcohol use: No  . Drug use:  No  . Sexual activity: Not Currently    Birth control/protection: None  Lifestyle  . Physical activity    Days per week: Not on file    Minutes per session: Not on file  . Stress: Not on file  Relationships  . Social Herbalist on phone: Not on file    Gets together: Not on file    Attends religious service: Not on file    Active member of club or organization: Not on file    Attends meetings of clubs or organizations: Not on file    Relationship status: Not on file  Other Topics Concern  . Not on file  Social History Narrative  . Not on file   Family History  Problem Relation Age of Onset  . Cancer Mother   . Cancer Father   . Coronary artery disease Neg Hx    No Known Allergies Prior to Admission medications   Medication Sig Start Date End Date Taking? Authorizing Provider  apixaban (ELIQUIS) 5 MG TABS tablet Take 1 tablet (5 mg total) by mouth 2 (two) times daily. For the first 7 days, take 2 tablets twice a day. Then begin 1 tablet twice a day. Patient taking differently: Take 5 mg by mouth 2 (two) times daily.  08/21/16  Yes Nat Christen, MD  Calcium-Vitamin D-Vitamin K 650-12.5-40 MG-MCG-MCG CHEW Chew 2 tablets by mouth at bedtime.   Yes [provider]  empagliflozin (JARDIANCE) 25 MG TABS tablet Take 12.5 mg by mouth daily.   Yes [provider]  gabapentin (NEURONTIN) 100 MG capsule TAKE (1) CAPSULE BY MOUTH TWICE DAILY. Patient taking differently: Take 100 mg by mouth 2 (two) times daily.  07/03/18  Yes Nida, Marella Chimes, MD  hydrALAZINE (APRESOLINE) 50 MG tablet TAKE (1) TABLET BY MOUTH (3) TIMES DAILY. Patient taking differently: Take 50 mg by mouth 3 (three) times daily.  03/13/19  Yes Adrian Prows, MD  lisinopril-hydrochlorothiazide (PRINZIDE,ZESTORETIC) 20-25 MG per tablet Take 1 tablet by mouth daily.   Yes [provider]  metFORMIN (GLUCOPHAGE) 1000 MG tablet TAKE (1) TABLET BY MOUTH TWICE DAILY. Patient taking differently:  Take 1,000 mg by mouth 2 (two) times a day.  02/21/16  Yes Nida, Marella Chimes, MD  Multiple Vitamins-Minerals (ONE-A-DAY 50 PLUS PO) Take 1 tablet by mouth at bedtime.    Yes [provider]  Nebivolol HCl (BYSTOLIC) 20 MG TABS TAKE (1) TABLET BY MOUTH ONCE DAILY. 06/19/19  Yes Adrian Prows, MD  nitroGLYCERIN (NITROSTAT) 0.4 MG SL tablet Place 1 tablet (0.4 mg total) under the tongue every 5 (five) minutes as needed for chest pain. 02/25/19  Yes Adrian Prows, MD  omeprazole (PRILOSEC) 20 MG capsule 1 PO 30 MINS PRIOR TO BREAKFAST. Patient taking differently: Take 20 mg by mouth daily before breakfast.  04/16/19  Yes Fields, Marga Melnick, MD  pravastatin (PRAVACHOL)  40 MG tablet Take 40 mg by mouth at bedtime.    Yes [provider]  spironolactone (ALDACTONE) 50 MG tablet Take 1 tablet (50 mg total) by mouth every morning. 04/03/19  Yes Adrian Prows, MD  tamsulosin (FLOMAX) 0.4 MG CAPS capsule Take 0.4 mg by mouth daily.   Yes [provider]  torsemide (DEMADEX) 20 MG tablet Take 20 mg by mouth daily.  12/19/16  Yes [provider]  traMADol (ULTRAM) 50 MG tablet Take 1 tablet (50 mg total) by mouth every 6 (six) hours as needed. Patient taking differently: Take 50-100 mg by mouth 2 (two) times daily as needed for moderate pain.  12/09/16  Yes Fredia Sorrow, MD  traZODone (DESYREL) 100 MG tablet Take 100 mg by mouth at bedtime.   Yes [provider]  verapamil (CALAN-SR) 240 MG CR tablet Take 1 tablet (240 mg total) by mouth at bedtime. 05/12/13  Yes Rothbart, Cristopher Estimable, MD  vitamin B-12 (CYANOCOBALAMIN) 500 MCG tablet Take 500 mcg by mouth daily.   Yes [provider]  Dulaglutide (TRULICITY) 1.5 0000000 SOPN INJECT 1.5MG  INTO THE SKIN ONCE A WEEK. Patient not taking: Reported on 06/13/2019 05/08/19   Cassandria Anger, MD  oxyCODONE-acetaminophen (PERCOCET/ROXICET) 5-325 MG tablet Take 2 tablets by mouth every 6 (six) hours as needed for severe  pain. Patient not taking: Reported on 04/09/2019 02/02/19   Long, Wonda Olds, MD     Positive ROS: All other systems have been reviewed and were otherwise negative with the exception of those mentioned in the HPI and as above.  Physical Exam: General: Alert, no acute distress Cardiovascular: No pedal edema Respiratory: No cyanosis, no use of accessory musculature GI: No organomegaly, abdomen is soft and non-tender Skin: No lesions in the area of chief complaint Neurologic: Sensation intact distally Psychiatric: Patient is competent for consent with normal mood and affect Lymphatic: No axillary or cervical lymphadenopathy  MUSCULOSKELETAL: right hip 0-85 degrees with a painful arc, no IR, 10 ER, EHL intact  Assessment: Right hip osteoarthritis   Plan: Plan for Procedure(s): TOTAL HIP ARTHROPLASTY  The risks benefits and alternatives were discussed with the patient including but not limited to the risks of nonoperative treatment, versus surgical intervention including infection, bleeding, nerve injury, periprosthetic fracture, the need for revision surgery, dislocation, leg length discrepancy, blood clots, cardiopulmonary complications, morbidity, mortality, among others, and they were willing to proceed.     Anticipated LOS equal to or greater than 2 midnights due to - Age 73 and older with one or more of the following:  - Obesity  - Expected need for hospital services (PT, OT, Nursing) required for safe  discharge  - Anticipated need for postoperative skilled nursing care or inpatient rehab  - Active co-morbidities: Coronary Artery Disease and DVT/VTE OR   - Unanticipated findings during/Post Surgery: None  - Patient is a high risk of re-admission due to: None     Omar Bridge, MD Cell [phone]   06/24/2019 12:34 PM

## 2019-06-24 NOTE — Anesthesia Procedure Notes (Signed)
Spinal  Patient location during procedure: OR End time: 06/24/2019 1:24 PM Staffing Resident/CRNA: Lissa Morales, CRNA Performed: resident/CRNA  Preanesthetic Checklist Completed: patient identified, surgical consent, pre-op evaluation, timeout performed, IV checked, risks and benefits discussed and monitors and equipment checked Spinal Block Patient position: sitting Prep: DuraPrep Patient monitoring: heart rate, continuous pulse ox and blood pressure Approach: right paramedian Location: L3-4 Injection technique: single-shot Needle Needle type: Pencan  Needle gauge: 24 G Needle length: 9 cm Assessment Sensory level: T6 Additional Notes Expiration date of kit checked and confirmed. Patient tolerated procedure well, without complications.

## 2019-06-24 NOTE — Transfer of Care (Signed)
Immediate Anesthesia Transfer of Care Note  Patient: Omar [name]  Procedure(s) Performed: TOTAL HIP ARTHROPLASTY (Right Hip)  Patient Location: PACU  Anesthesia Type:Spinal  Level of Consciousness: awake, alert , oriented and patient cooperative  Airway & Oxygen Therapy: Patient Spontanous Breathing and Patient connected to face mask oxygen  Post-op Assessment: Report given to RN and Post -op Vital signs reviewed and stable  Post vital signs: stable  Last Vitals:  Vitals Value Taken Time  BP 89/56 06/24/19 1547  Temp 36.6 C 06/24/19 1545  Pulse 54 06/24/19 1550  Resp 19 06/24/19 1550  SpO2 99 % 06/24/19 1550  Vitals shown include unvalidated device data.  Last Pain:  Vitals:   06/24/19 1144  TempSrc:   PainSc: 5       Patients Stated Pain Goal: 4 ([id number])  Complications: No apparent anesthesia complications

## 2019-06-25 ENCOUNTER — Encounter (HOSPITAL_COMMUNITY): Payer: Self-pay | Admitting: Orthopedic Surgery

## 2019-06-25 LAB — CBC
HCT: 33.2 % — ABNORMAL LOW (ref 39.0–52.0)
Hemoglobin: 10.7 g/dL — ABNORMAL LOW (ref 13.0–17.0)
MCH: 30 pg (ref 26.0–34.0)
MCHC: 32.2 g/dL (ref 30.0–36.0)
MCV: 93 fL (ref 80.0–100.0)
Platelets: 237 10*3/uL (ref 150–400)
RBC: 3.57 MIL/uL — ABNORMAL LOW (ref 4.22–5.81)
RDW: 13.9 % (ref 11.5–15.5)
WBC: 12.6 10*3/uL — ABNORMAL HIGH (ref 4.0–10.5)
nRBC: 0 % (ref 0.0–0.2)

## 2019-06-25 LAB — BASIC METABOLIC PANEL
Anion gap: 10 (ref 5–15)
BUN: 31 mg/dL — ABNORMAL HIGH (ref 8–23)
CO2: 20 mmol/L — ABNORMAL LOW (ref 22–32)
Calcium: 8 mg/dL — ABNORMAL LOW (ref 8.9–10.3)
Chloride: 102 mmol/L (ref 98–111)
Creatinine, Ser: 1.13 mg/dL (ref 0.61–1.24)
GFR calc Af Amer: >60 mL/min (ref >60)
GFR calc non Af Amer: >60 mL/min (ref >60)
Glucose, Bld: 312 mg/dL — ABNORMAL HIGH (ref 70–99)
Potassium: 4.6 mmol/L (ref 3.5–5.1)
Sodium: 132 mmol/L — ABNORMAL LOW (ref 135–145)

## 2019-06-25 LAB — GLUCOSE, CAPILLARY
Glucose-Capillary: 305 mg/dL — ABNORMAL HIGH (ref 70–99)
Glucose-Capillary: 290 mg/dL — ABNORMAL HIGH (ref 70–99)

## 2019-06-25 NOTE — Plan of Care (Signed)
Plan of care reviewed and discussed with the patient. 

## 2019-06-25 NOTE — Evaluation (Signed)
Physical Therapy Evaluation Patient Details Name: Omar [name] MRN: [id number] DOB: 07/15/1946 Today's Date: 06/25/2019   History of Present Illness  Pt is a 73 year old male s/p right THA by posterior approach with PMHx significant for however not limited to left frontal Meningioma, DVT, DM, neuropathy  Clinical Impression  Pt is s/p THA resulting in the deficits listed below (see PT Problem List).  Pt will benefit from skilled PT to increase their independence and safety with mobility to allow discharge to the venue listed below.  Pt educated on posterior hip precautions and assisted with ambulating in hallway POD #1.  Pt has steps to enter home and lives with spouse.  Pt performed a few exercises upon returning to recliner.       Follow Up Recommendations Follow surgeon's recommendation for DC plan and follow-up therapies    Equipment Recommendations  Rolling walker with 5" wheels    Recommendations for Other Services       Precautions / Restrictions Precautions Precautions: Fall;Posterior Hip Precaution Comments: provided handout and educated on posterior hip precautions Restrictions Other Position/Activity Restrictions: WBAT      Mobility  Bed Mobility Overal bed mobility: Needs Assistance Bed Mobility: Supine to Sit     Supine to sit: Min assist;HOB elevated     General bed mobility comments: light assist for R LE, cues for safe technique and maintaining precautions  Transfers Overall transfer level: Needs assistance Equipment used: Rolling walker (2 wheeled) Transfers: Sit to/from Stand Sit to Stand: Min assist         General transfer comment: cues for safe technique and maintaining precautions, assist to rise and control descent  Ambulation/Gait Ambulation/Gait assistance: Min guard Gait Distance (Feet): 100 Feet Assistive device: Rolling walker (2 wheeled) Gait Pattern/deviations: Step-to pattern;Decreased stance time - right;Antalgic     General  Gait Details: verbal cues for sequence, RW positioning, step length, posture, hip precautions with turning  Stairs            Wheelchair Mobility    Modified Rankin (Stroke Patients Only)       Balance                                             Pertinent Vitals/Pain Pain Assessment: 0-10 Pain Score: 4  Pain Location: right hip Pain Descriptors / Indicators: Aching;Sore Pain Intervention(s): Monitored during session;Limited activity within patient's tolerance;Repositioned    Home Living Family/patient expects to be discharged to:: Private residence Living Arrangements: Spouse/significant other   Type of Home: House Home Access: Stairs to enter Entrance Stairs-Rails: Right;Can reach Software engineer of Steps: 4 Home Layout: Able to live on main level with bedroom/bathroom Home Equipment: None      Prior Function Level of Independence: Independent               Hand Dominance        Extremity/Trunk Assessment        Lower Extremity Assessment Lower Extremity Assessment: RLE deficits/detail RLE Deficits / Details: anticipated post op hip weakness, able to perform ankle pumps, maintained posterior hip precautions       Communication   Communication: No difficulties  Cognition Arousal/Alertness: Awake/alert Behavior During Therapy: WFL for tasks assessed/performed Overall Cognitive Status: Within Functional Limits for tasks assessed  General Comments      Exercises Total Joint Exercises Ankle Circles/Pumps: AROM;Both;10 reps Quad Sets: AROM;10 reps;Both Hip ABduction/ADduction: AAROM;Right;10 reps Long Arc Quad: AROM;Right;Seated;10 reps   Assessment/Plan    PT Assessment Patient needs continued PT services  PT Problem List Decreased strength;Decreased activity tolerance;Decreased balance;Decreased mobility;Decreased knowledge of use of  DME;Pain;Decreased knowledge of precautions       PT Treatment Interventions Stair training;Gait training;Balance training;DME instruction;Therapeutic exercise;Functional mobility training;Therapeutic activities;Patient/family education    PT Goals (Current goals can be found in the Care Plan section)  Acute Rehab PT Goals PT Goal Formulation: With patient Time For Goal Achievement: 06/30/19 Potential to Achieve Goals: Good    Frequency 7X/week   Barriers to discharge        Co-evaluation               AM-PAC PT "6 Clicks" Mobility  Outcome Measure Help needed turning from your back to your side while in a flat bed without using bedrails?: A Little Help needed moving from lying on your back to sitting on the side of a flat bed without using bedrails?: A Little Help needed moving to and from a bed to a chair (including a wheelchair)?: A Little Help needed standing up from a chair using your arms (e.g., wheelchair or bedside chair)?: A Little Help needed to walk in hospital room?: A Little Help needed climbing 3-5 steps with a railing? : A Lot 6 Click Score: 17    End of Session Equipment Utilized During Treatment: Gait belt Activity Tolerance: Patient tolerated treatment well Patient left: with call bell/phone within reach;in chair Nurse Communication: Mobility status PT Visit Diagnosis: Other abnormalities of gait and mobility (R26.89)    Time: 1005-1030 PT Time Calculation (min) (ACUTE ONLY): 25 min   Charges:   PT Evaluation $PT Eval Low Complexity: 1 Low PT Treatments $Therapeutic Exercise: 8-22 mins       Carmelia Bake, PT, DPT Acute Rehabilitation Services Office: [phone] Pager: [phone]  Trena Platt 06/25/2019, 11:54 AM

## 2019-06-25 NOTE — Progress Notes (Signed)
Inpatient Diabetes Program Recommendations  AACE/ADA: New Consensus Statement on Inpatient Glycemic Control (2015)  Target Ranges:  Prepandial:   less than 140 mg/dL      Peak postprandial:   less than 180 mg/dL (1-2 hours)      Critically ill patients:  140 - 180 mg/dL   Lab Results  Component Value Date   GLUCAP 305 (H) 06/25/2019   HGBA1C 6.9 (H) 06/18/2019    Review of Glycemic Control Results for Omar [name], Omar [name] (MRN [id number]) as of 06/25/2019 08:44  Ref. Range 06/24/2019 11:25 06/24/2019 16:19 06/25/2019 07:39  Glucose-Capillary Latest Ref Range: 70 - 99 mg/dL 207 (H) 130 (H) 305 (H)   Diabetes history: DM 2 Outpatient Diabetes medications: Jardiance 12.5 mg Daily, Metformin 1000 mg bid Current orders for Inpatient glycemic control: Invokana 100 mg Daily, Metformin 1000 mg bid  A1c 6.9% on 9/16  Inpatient Diabetes Program Recommendations:    Noted Pt received decadron 10 mg at 1421 pm yesterday. Glucose trends increased to 305 this am.  Please order Novolog 0-15 units tid + Novolog 0-5 units qhs for bedtime coverage.  Thanks,  Tama Headings RN, MSN, BC-ADM Inpatient Diabetes Coordinator Team Pager [phone] (8a-5p)

## 2019-06-25 NOTE — Progress Notes (Signed)
Physical Therapy Treatment Patient Details Name: Omar [name] MRN: [id number] DOB: [birth year] Today's Date: 06/25/2019    History of Present Illness Pt is a 73 year old male s/p right THA by posterior approach with PMHx significant for however not limited to left frontal Meningioma, DVT, DM, neuropathy    PT Comments    Pt ambulated in hallway and practiced safe stair technique with spouse present and observing.  Reviewed posterior hip precautions.  Recommended pt perform ankle pumps, quad sets, and LAQ (long kicks) upon d/c home until able to follow up with HHPT.  Pt requires safety cues so would prefer PT present for more practice with exercises while maintaining precautions.  Pt and spouse feel pt is ready for d/c home today.   Follow Up Recommendations  Follow surgeon's recommendation for DC plan and follow-up therapies     Equipment Recommendations  Rolling walker with 5" wheels    Recommendations for Other Services       Precautions / Restrictions Precautions Precautions: Fall;Posterior Hip Precaution Comments: pt and spouse reviewed and educated on posterior hip precautions Restrictions Other Position/Activity Restrictions: WBAT    Mobility  Bed Mobility Overal bed mobility: Needs Assistance Bed Mobility: Supine to Sit     Supine to sit: Min assist;HOB elevated     General bed mobility comments: pt up in recliner  Transfers Overall transfer level: Needs assistance Equipment used: Rolling walker (2 wheeled) Transfers: Sit to/from Stand Sit to Stand: Min guard         General transfer comment: cues for safe technique and maintaining precautions  Ambulation/Gait Ambulation/Gait assistance: Min guard Gait Distance (Feet): 120 Feet Assistive device: Rolling walker (2 wheeled) Gait Pattern/deviations: Step-to pattern;Decreased stance time - right;Antalgic     General Gait Details: verbal cues for sequence, RW positioning, step length, posture, hip  precautions with turning   Stairs Stairs: Yes Stairs assistance: Min guard Stair Management: Step to pattern;Forwards;Two rails Number of Stairs: 3 General stair comments: verbal cues for sequence and safety, performed twice; spouse present and observed   Wheelchair Mobility    Modified Rankin (Stroke Patients Only)       Balance                                            Cognition   Exercises   General Comments        Pertinent Vitals/Pain Pain Assessment: 0-10 Pain Score: 4  Pain Location: right hip Pain Descriptors / Indicators: Aching;Sore Pain Intervention(s): Monitored during session;Repositioned    Home Living Family/patient expects to be discharged to:: Private residence Living Arrangements: Spouse/significant other   Type of Home: House Home Access: Stairs to enter Entrance Stairs-Rails: Right;Can reach both;Left Home Layout: Able to live on main level with bedroom/bathroom Home Equipment: None      Prior Function Level of Independence: Independent          PT Goals (current goals can now be found in the care plan section) Acute Rehab PT Goals PT Goal Formulation: With patient Time For Goal Achievement: 06/30/19 Potential to Achieve Goals: Good Progress towards PT goals: Progressing toward goals    Frequency    7X/week      PT Plan Current plan remains appropriate    Co-evaluation              AM-PAC PT "6 Clicks" Mobility  Outcome Measure  Help needed turning from your back to your side while in a flat bed without using bedrails?: A Little Help needed moving from lying on your back to sitting on the side of a flat bed without using bedrails?: A Little Help needed moving to and from a bed to a chair (including a wheelchair)?: A Little Help needed standing up from a chair using your arms (e.g., wheelchair or bedside chair)?: A Little Help needed to walk in hospital room?: A Little Help needed climbing 3-5  steps with a railing? : A Little 6 Click Score: 18    End of Session Equipment Utilized During Treatment: Gait belt Activity Tolerance: Patient tolerated treatment well Patient left: with call bell/phone within reach;in chair;with family/visitor present Nurse Communication: Mobility status PT Visit Diagnosis: Other abnormalities of gait and mobility (R26.89)     Time: [id number] PT Time Calculation (min) (ACUTE ONLY): 21 min  Charges:  $Gait Training: 8-22 mins                    Carmelia Bake, PT, DPT Acute Rehabilitation Services Office: [phone] Pager: [phone]  Trena Platt 06/25/2019, 2:58 PM

## 2019-06-25 NOTE — Discharge Summary (Signed)
Physician Discharge Summary  Patient ID: Omar [name] MRN: [id number] DOB/AGE: 1946-07-23 73 y.o.  Admit date: 06/24/2019 Discharge date: 06/25/2019  Admission Diagnoses:  Primary localized osteoarthritis of right hip  Discharge Diagnoses:  Principal Problem:   Primary localized osteoarthritis of right hip Active Problems:   S/P total hip arthroplasty   Past Medical History:  Diagnosis Date  . Abnormal EKG    Inferior Q waves  . BPH (benign prostatic hyperplasia)   . Chronic anticoagulation 01/16/2017  . Coronary artery disease   . Degenerative joint disease   . Diabetes mellitus, type 2 (HCC)    No insulin  . DVT (deep venous thrombosis) ([place]) 4 -5 yrs ago  . DVT of leg (deep venous thrombosis) ([place]) 07/02/2015  . Heart murmur   . Hepatic steatosis   . Heterozygous factor V Leiden mutation ([place]) 01/16/2017  . Hyperlipidemia   . 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 Meningioma (HCC)    Left frontal; CVA identified by MRI; no neurologic symptoms  . Neuropathy   . Obesity   . Primary localized osteoarthritis of right hip 06/24/2019  . Sleep apnea 2008   Dr. Merlene Laughter interpreted the study-CPAP recommended, but refused by patient  . Stroke [facility])    no deficits from stroke, "Didnt know I had one when they say I did".    Surgeries: Procedure(s): TOTAL HIP ARTHROPLASTY on 06/24/2019   Consultants (if any):   Discharged Condition: Improved  Hospital Course: Omar [name] is an 73 y.o. male who was admitted 06/24/2019 with a diagnosis of Primary localized osteoarthritis of right hip and went to the operating room on 06/24/2019 and underwent the above named procedures.    He was given perioperative antibiotics:  Anti-infectives (From admission, onward)   Start     Dose/Rate Route Frequency Ordered Stop   06/24/19 2000  ceFAZolin (ANCEF) IVPB 2g/100 mL premix     2 g 200 mL/hr over 30 Minutes  Intravenous Every 6 hours 06/24/19 1737 06/25/19 0324   06/24/19 1130  ceFAZolin (ANCEF) IVPB 2g/100 mL premix     2 g 200 mL/hr over 30 Minutes Intravenous On call to O.R. 06/24/19 1118 06/24/19 1315    .  He was given sequential compression devices, early ambulation, and eliquis for DVT prophylaxis.  He benefited maximally from the hospital stay and there were no complications.    Recent vital signs:  Vitals:   06/25/19 0822 06/25/19 1315  BP: 104/62 105/67  Pulse: 60 (!) 57  Resp: 16 16  Temp: 97.8 F (36.6 C) 98 F (36.7 C)  SpO2: 98% 99%    Recent laboratory studies:  Lab Results  Component Value Date   HGB 10.7 (L) 06/25/2019   HGB 14.6 06/18/2019   HGB 15.2 12/26/2016   Lab Results  Component Value Date   WBC 12.6 (H) 06/25/2019   PLT 237 06/25/2019   Lab Results  Component Value Date   INR 0.98 06/04/2012   Lab Results  Component Value Date   NA 132 (L) 06/25/2019   K 4.6 06/25/2019   CL 102 06/25/2019   CO2 20 (L) 06/25/2019   BUN 31 (H) 06/25/2019   CREATININE 1.13 06/25/2019   GLUCOSE 312 (H) 06/25/2019    Discharge Medications:   Allergies as of 06/25/2019   No Known Allergies     Medication List    STOP taking these medications  oxyCODONE-acetaminophen 5-325 MG tablet Commonly known as: PERCOCET/ROXICET     TAKE these medications   apixaban 5 MG Tabs tablet Commonly known as: Eliquis Take 1 tablet (5 mg total) by mouth 2 (two) times daily. For the first 7 days, take 2 tablets twice a day. Then begin 1 tablet twice a day. What changed: additional instructions   baclofen 10 MG tablet Commonly known as: LIORESAL Take 1 tablet (10 mg total) by mouth 3 (three) times daily. As needed for muscle spasm   Bystolic 20 MG Tabs Generic drug: Nebivolol HCl TAKE (1) TABLET BY MOUTH ONCE DAILY.   Calcium-Vitamin D-Vitamin K 650-12.5-40 MG-MCG-MCG Chew Chew 2 tablets by mouth at bedtime.   gabapentin 100 MG capsule Commonly known as:  NEURONTIN TAKE (1) CAPSULE BY MOUTH TWICE DAILY. What changed: See the new instructions.   hydrALAZINE 50 MG tablet Commonly known as: APRESOLINE TAKE (1) TABLET BY MOUTH (3) TIMES DAILY. What changed: See the new instructions.   Jardiance 25 MG Tabs tablet Generic drug: empagliflozin Take 12.5 mg by mouth daily.   lisinopril-hydrochlorothiazide 20-25 MG tablet Commonly known as: ZESTORETIC Take 1 tablet by mouth daily.   metFORMIN 1000 MG tablet Commonly known as: GLUCOPHAGE TAKE (1) TABLET BY MOUTH TWICE DAILY. What changed: See the new instructions.   nitroGLYCERIN 0.4 MG SL tablet Commonly known as: NITROSTAT Place 1 tablet (0.4 mg total) under the tongue every 5 (five) minutes as needed for chest pain.   omeprazole 20 MG capsule Commonly known as: PRILOSEC 1 PO 30 MINS PRIOR TO BREAKFAST. What changed:   how much to take  how to take this  when to take this  additional instructions   ondansetron 4 MG tablet Commonly known as: Zofran Take 1 tablet (4 mg total) by mouth every 8 (eight) hours as needed for nausea or vomiting.   ONE-A-DAY 50 PLUS PO Take 1 tablet by mouth at bedtime.   oxyCODONE 5 MG immediate release tablet Commonly known as: Roxicodone Take 1 tablet (5 mg total) by mouth every 4 (four) hours as needed for severe pain.   pravastatin 40 MG tablet Commonly known as: PRAVACHOL Take 40 mg by mouth at bedtime.   sennosides-docusate sodium 8.6-50 MG tablet Commonly known as: SENOKOT-S Take 2 tablets by mouth daily.   spironolactone 50 MG tablet Commonly known as: Aldactone Take 1 tablet (50 mg total) by mouth every morning.   tamsulosin 0.4 MG Caps capsule Commonly known as: FLOMAX Take 0.4 mg by mouth daily.   torsemide 20 MG tablet Commonly known as: DEMADEX Take 20 mg by mouth daily.   traMADol 50 MG tablet Commonly known as: ULTRAM Take 1 tablet (50 mg total) by mouth every 6 (six) hours as needed. What changed:   how much to  take  when to take this  reasons to take this   traZODone 100 MG tablet Commonly known as: DESYREL Take 100 mg by mouth at bedtime.   Trulicity 1.5 0000000 Sopn Generic drug: Dulaglutide INJECT 1.5MG  INTO THE SKIN ONCE A WEEK.   verapamil 240 MG CR tablet Commonly known as: CALAN-SR Take 1 tablet (240 mg total) by mouth at bedtime.   vitamin B-12 500 MCG tablet Commonly known as: CYANOCOBALAMIN Take 500 mcg by mouth daily.       Diagnostic Studies: Dg Hip Unilat With Pelvis 2-3 Views Right  Result Date: 06/24/2019 CLINICAL DATA:  Post right hip total arthroplasty. EXAM: DG HIP (WITH OR WITHOUT PELVIS) 2-3V RIGHT COMPARISON:  None. FINDINGS: Right hip arthroplasty in expected alignment. Femoral stem is midline. No periprosthetic lucency or fracture. Recent postsurgical change includes edema in the subcutaneous tissues. Remainder of the bony pelvis is intact. IMPRESSION: Right hip arthroplasty in expected alignment without immediate postoperative complication. Electronically Signed   By: Keith Rake M.D.   On: 06/24/2019 21:59    Disposition: Discharge disposition: 01-Home or Self Care         Follow-up Information    Marchia Bond, MD. Schedule an appointment as soon as possible for a visit in 2 weeks.   Specialty: Orthopedic Surgery Contact information: [address] [place] [address] [phone]            Signed: Johnny Bridge 06/25/2019, 1:20 PM

## 2019-06-28 DIAGNOSIS — Z7901 Long term (current) use of anticoagulants: Secondary | ICD-10-CM | POA: Diagnosis not present

## 2019-06-28 DIAGNOSIS — E1159 Type 2 diabetes mellitus with other circulatory complications: Secondary | ICD-10-CM | POA: Diagnosis not present

## 2019-06-28 DIAGNOSIS — D6851 Activated protein C resistance: Secondary | ICD-10-CM | POA: Diagnosis not present

## 2019-06-28 DIAGNOSIS — Z8673 Personal history of transient ischemic attack (TIA), and cerebral infarction without residual deficits: Secondary | ICD-10-CM | POA: Diagnosis not present

## 2019-06-28 DIAGNOSIS — Z471 Aftercare following joint replacement surgery: Secondary | ICD-10-CM | POA: Diagnosis not present

## 2019-06-28 DIAGNOSIS — I10 Essential (primary) hypertension: Secondary | ICD-10-CM | POA: Diagnosis not present

## 2019-06-28 DIAGNOSIS — I2510 Atherosclerotic heart disease of native coronary artery without angina pectoris: Secondary | ICD-10-CM | POA: Diagnosis not present

## 2019-06-28 DIAGNOSIS — Z9181 History of falling: Secondary | ICD-10-CM | POA: Diagnosis not present

## 2019-06-28 DIAGNOSIS — Z7984 Long term (current) use of oral hypoglycemic drugs: Secondary | ICD-10-CM | POA: Diagnosis not present

## 2019-06-28 DIAGNOSIS — Z86718 Personal history of other venous thrombosis and embolism: Secondary | ICD-10-CM | POA: Diagnosis not present

## 2019-06-28 DIAGNOSIS — E1140 Type 2 diabetes mellitus with diabetic neuropathy, unspecified: Secondary | ICD-10-CM | POA: Diagnosis not present

## 2019-06-28 DIAGNOSIS — E782 Mixed hyperlipidemia: Secondary | ICD-10-CM | POA: Diagnosis not present

## 2019-06-28 DIAGNOSIS — Z9861 Coronary angioplasty status: Secondary | ICD-10-CM | POA: Diagnosis not present

## 2019-06-28 DIAGNOSIS — G4730 Sleep apnea, unspecified: Secondary | ICD-10-CM | POA: Diagnosis not present

## 2019-06-28 DIAGNOSIS — Z96641 Presence of right artificial hip joint: Secondary | ICD-10-CM | POA: Diagnosis not present

## 2019-06-28 DIAGNOSIS — N400 Enlarged prostate without lower urinary tract symptoms: Secondary | ICD-10-CM | POA: Diagnosis not present

## 2019-07-03 DIAGNOSIS — E782 Mixed hyperlipidemia: Secondary | ICD-10-CM | POA: Diagnosis not present

## 2019-07-03 DIAGNOSIS — Z7901 Long term (current) use of anticoagulants: Secondary | ICD-10-CM | POA: Diagnosis not present

## 2019-07-03 DIAGNOSIS — Z9861 Coronary angioplasty status: Secondary | ICD-10-CM | POA: Diagnosis not present

## 2019-07-03 DIAGNOSIS — N400 Enlarged prostate without lower urinary tract symptoms: Secondary | ICD-10-CM | POA: Diagnosis not present

## 2019-07-03 DIAGNOSIS — D6851 Activated protein C resistance: Secondary | ICD-10-CM | POA: Diagnosis not present

## 2019-07-03 DIAGNOSIS — Z8673 Personal history of transient ischemic attack (TIA), and cerebral infarction without residual deficits: Secondary | ICD-10-CM | POA: Diagnosis not present

## 2019-07-03 DIAGNOSIS — Z86718 Personal history of other venous thrombosis and embolism: Secondary | ICD-10-CM | POA: Diagnosis not present

## 2019-07-03 DIAGNOSIS — Z9181 History of falling: Secondary | ICD-10-CM | POA: Diagnosis not present

## 2019-07-03 DIAGNOSIS — I2510 Atherosclerotic heart disease of native coronary artery without angina pectoris: Secondary | ICD-10-CM | POA: Diagnosis not present

## 2019-07-03 DIAGNOSIS — G4730 Sleep apnea, unspecified: Secondary | ICD-10-CM | POA: Diagnosis not present

## 2019-07-03 DIAGNOSIS — Z471 Aftercare following joint replacement surgery: Secondary | ICD-10-CM | POA: Diagnosis not present

## 2019-07-03 DIAGNOSIS — I10 Essential (primary) hypertension: Secondary | ICD-10-CM | POA: Diagnosis not present

## 2019-07-03 DIAGNOSIS — Z96641 Presence of right artificial hip joint: Secondary | ICD-10-CM | POA: Diagnosis not present

## 2019-07-03 DIAGNOSIS — E1140 Type 2 diabetes mellitus with diabetic neuropathy, unspecified: Secondary | ICD-10-CM | POA: Diagnosis not present

## 2019-07-03 DIAGNOSIS — Z7984 Long term (current) use of oral hypoglycemic drugs: Secondary | ICD-10-CM | POA: Diagnosis not present

## 2019-07-03 DIAGNOSIS — E1159 Type 2 diabetes mellitus with other circulatory complications: Secondary | ICD-10-CM | POA: Diagnosis not present

## 2019-07-09 DIAGNOSIS — Z96641 Presence of right artificial hip joint: Secondary | ICD-10-CM | POA: Diagnosis not present

## 2019-07-12 ENCOUNTER — Other Ambulatory Visit: Payer: Self-pay | Admitting: "Endocrinology

## 2019-08-04 ENCOUNTER — Other Ambulatory Visit: Payer: Self-pay | Admitting: Cardiology

## 2019-08-06 DIAGNOSIS — M545 Low back pain: Secondary | ICD-10-CM | POA: Diagnosis not present

## 2019-08-08 ENCOUNTER — Other Ambulatory Visit: Payer: Self-pay

## 2019-08-08 DIAGNOSIS — Z20822 Contact with and (suspected) exposure to covid-19: Secondary | ICD-10-CM

## 2019-08-09 LAB — NOVEL CORONAVIRUS, NAA: SARS-CoV-2, NAA: NOT DETECTED

## 2019-08-15 ENCOUNTER — Other Ambulatory Visit: Payer: Self-pay

## 2019-08-15 ENCOUNTER — Other Ambulatory Visit (HOSPITAL_COMMUNITY): Payer: Self-pay | Admitting: Pulmonary Disease

## 2019-08-15 ENCOUNTER — Ambulatory Visit (HOSPITAL_COMMUNITY)
Admission: RE | Admit: 2019-08-15 | Discharge: 2019-08-15 | Disposition: A | Discharge status: Home / Self Care | Payer: PPO | Source: Ambulatory Visit | Attending: Pulmonary Disease | Admitting: Pulmonary Disease

## 2019-08-15 DIAGNOSIS — M545 Low back pain, unspecified: Secondary | ICD-10-CM

## 2019-08-15 DIAGNOSIS — M1990 Unspecified osteoarthritis, unspecified site: Secondary | ICD-10-CM | POA: Diagnosis not present

## 2019-08-15 DIAGNOSIS — R319 Hematuria, unspecified: Secondary | ICD-10-CM

## 2019-08-15 DIAGNOSIS — N200 Calculus of kidney: Secondary | ICD-10-CM | POA: Diagnosis not present

## 2019-08-15 DIAGNOSIS — I2510 Atherosclerotic heart disease of native coronary artery without angina pectoris: Secondary | ICD-10-CM | POA: Diagnosis not present

## 2019-08-15 DIAGNOSIS — E119 Type 2 diabetes mellitus without complications: Secondary | ICD-10-CM | POA: Diagnosis not present

## 2019-08-19 ENCOUNTER — Other Ambulatory Visit: Payer: Self-pay | Admitting: Orthopedic Surgery

## 2019-08-19 DIAGNOSIS — M545 Low back pain, unspecified: Secondary | ICD-10-CM

## 2019-08-22 ENCOUNTER — Ambulatory Visit
Admission: RE | Admit: 2019-08-22 | Discharge: 2019-08-22 | Disposition: A | Discharge status: Home / Self Care | Payer: PPO | Source: Ambulatory Visit | Attending: Orthopedic Surgery | Admitting: Orthopedic Surgery

## 2019-08-22 ENCOUNTER — Ambulatory Visit (INDEPENDENT_AMBULATORY_CARE_PROVIDER_SITE_OTHER): Payer: PPO | Admitting: Cardiology

## 2019-08-22 ENCOUNTER — Encounter: Payer: Self-pay | Admitting: Cardiology

## 2019-08-22 ENCOUNTER — Other Ambulatory Visit: Payer: Self-pay

## 2019-08-22 VITALS — BP 120/79 | HR 68 | Temp 97.6°F | Ht 68.0 in | Wt 245.0 lb

## 2019-08-22 DIAGNOSIS — I25118 Atherosclerotic heart disease of native coronary artery with other forms of angina pectoris: Secondary | ICD-10-CM

## 2019-08-22 DIAGNOSIS — E1151 Type 2 diabetes mellitus with diabetic peripheral angiopathy without gangrene: Secondary | ICD-10-CM | POA: Diagnosis not present

## 2019-08-22 DIAGNOSIS — M48061 Spinal stenosis, lumbar region without neurogenic claudication: Secondary | ICD-10-CM | POA: Diagnosis not present

## 2019-08-22 DIAGNOSIS — M545 Low back pain, unspecified: Secondary | ICD-10-CM

## 2019-08-22 DIAGNOSIS — I10 Essential (primary) hypertension: Secondary | ICD-10-CM | POA: Diagnosis not present

## 2019-08-22 DIAGNOSIS — E7800 Pure hypercholesterolemia, unspecified: Secondary | ICD-10-CM | POA: Diagnosis not present

## 2019-08-22 NOTE — Progress Notes (Signed)
Primary Physician/Referring:  Sinda Du, MD  Patient ID: Omar [name], male    DOB: Feb 12, 1946, 73 y.o.   MRN: [id number]  Chief Complaint  Patient presents with  . Coronary Artery Disease  . Hypertension  . Follow-up    1 yr   HPI:    JACOBUS [name]  is a 73 y.o.  Caucasian male who is fairly active, has history of known coronary artery disease and had undergone angioplasty to his ramus intermediate branch with drug-eluting stent implantation, Promus stent on 06/04/2012., here for routine anual CAD f/u. Wife at bedside. His PMD is significant for hypertension, mixed hyperlipidemia, PAD and CAD.  After a trip to [place], in 2016, had DVT and had recurrence of DVT and is now on chronic anticoagulation. He does use support stockings during winter. He has chronic dyspnea.  This is annual visit, states her dyspnea has remained stable, he has lost about 30 pounds in weight and underwent right hip arthroplasty without periprocedural complications.  He complains of back pain.  Past Medical History:  Diagnosis Date  . Abnormal EKG    Inferior Q waves  . BPH (benign prostatic hyperplasia)   . Chronic anticoagulation 01/16/2017  . Coronary artery disease   . Degenerative joint disease   . Diabetes mellitus, type 2 (HCC)    No insulin  . DVT (deep venous thrombosis) ([place]) 4 -5 yrs ago  . DVT of leg (deep venous thrombosis) ([place]) 07/02/2015  . Heart murmur   . Hepatic steatosis   . Heterozygous factor V Leiden mutation ([place]) 01/16/2017  . Hyperlipidemia   . 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 Meningioma (HCC)    Left frontal; CVA identified by MRI; no neurologic symptoms  . Neuropathy   . Obesity   . Primary localized osteoarthritis of right hip 06/24/2019  . Sleep apnea 2008   Dr. Merlene Laughter interpreted the study-CPAP recommended, but refused by patient  . Stroke [facility])    no deficits from stroke,  "Didnt know I had one when they say I did".   Past Surgical History:  Procedure Laterality Date  . BIOPSY  04/16/2019   Procedure: BIOPSY;  Surgeon: Danie Binder, MD;  Location: [facility];  Service: Endoscopy;;  . CATARACT EXTRACTION W/PHACO Left 04/03/2016   Procedure: CATARACT EXTRACTION PHACO AND INTRAOCULAR LENS PLACEMENT LEFT EYE CDE=21.76;  Surgeon: Williams Che, MD;  Location: [facility] ORS;  Service: Ophthalmology;  Laterality: Left;  . CATARACT EXTRACTION W/PHACO Right 06/19/2016   Procedure: CATARACT EXTRACTION PHACO AND INTRAOCULAR LENS PLACEMENT; CDE:  11.56;  Surgeon: Williams Che, MD;  Location: [facility];  Service: Ophthalmology;  Laterality: Right;  . COLONOSCOPY N/A 04/16/2019   Procedure: COLONOSCOPY;  Surgeon: Danie Binder, MD;  [facility];  Service: Endoscopy;  Laterality: N/A;  8:30am  . COLONOSCOPY W/ POLYPECTOMY  05/2008   polypectomy x4-adenomatous; internal hemorrhoids  . CORONARY ANGIOPLASTY WITH STENT PLACEMENT  06/04/2012     DES    to RI  . CORONARY STENT PLACEMENT     x 1  . DUPUYTREN / PALMAR FASCIOTOMY  2009   rt hand-dsc-went home same day x2  . ESOPHAGOGASTRODUODENOSCOPY N/A 04/16/2019   Procedure: ESOPHAGOGASTRODUODENOSCOPY (EGD);  Surgeon: Danie Binder, MD;  [facility];  Service: Endoscopy;  Laterality: N/A;  . LEFT HEART CATHETERIZATION WITH CORONARY ANGIOGRAM N/A 05/28/2012   Procedure: LEFT HEART  CATHETERIZATION WITH CORONARY ANGIOGRAM;  Surgeon: Laverda Page, MD;  Location: [facility] CATH LAB;  Service: Cardiovascular;  Laterality: N/A;  . PERCUTANEOUS CORONARY STENT INTERVENTION (PCI-S) N/A 06/04/2012   Procedure: PERCUTANEOUS CORONARY STENT INTERVENTION (PCI-S);  Surgeon: Laverda Page, MD;  Location: [facility] CATH LAB;  Service: Cardiovascular;  Laterality: N/A;  . POLYPECTOMY  04/16/2019   Procedure: POLYPECTOMY;  Surgeon: Danie Binder, MD;  [facility];  Service: Endoscopy;;  . TONSILLECTOMY    . TOTAL HIP  ARTHROPLASTY Right 06/24/2019   Procedure: TOTAL HIP ARTHROPLASTY;  Surgeon: Marchia Bond, MD;  Location: [facility];  Service: Orthopedics;  Laterality: Right;  . VENTRAL HERNIA REPAIR  2010   umb hernia-[facility]-went home same day   Social History   Socioeconomic History  . Marital status: Married    Spouse name: Not on file  . Number of children: 2  . Years of education: Not on file  . Highest education level: Not on file  Occupational History  . Occupation: Games developer    Comment: Research officer, trade union  Social Needs  . Financial resource strain: Not on file  . Food insecurity    Worry: Not on file    Inability: Not on file  . Transportation needs    Medical: Not on file    Non-medical: Not on file  Tobacco Use  . Smoking status: Never Smoker  . Smokeless tobacco: Never Used  Substance and Sexual Activity  . Alcohol use: No  . Drug use: No  . Sexual activity: Not Currently    Birth control/protection: None  Lifestyle  . Physical activity    Days per week: Not on file    Minutes per session: Not on file  . Stress: Not on file  Relationships  . Social Herbalist on phone: Not on file    Gets together: Not on file    Attends religious service: Not on file    Active member of club or organization: Not on file    Attends meetings of clubs or organizations: Not on file    Relationship status: Not on file  . Intimate partner violence    Fear of current or ex partner: Not on file    Emotionally abused: Not on file    Physically abused: Not on file    Forced sexual activity: Not on file  Other Topics Concern  . Not on file  Social History Narrative  . Not on file   ROS  Review of Systems  Constitution: Positive for weight loss (30 Lbs intentional). Negative for chills, decreased appetite, malaise/fatigue and weight gain.  Cardiovascular: Positive for dyspnea on exertion and leg swelling. Negative for syncope.  Endocrine: Negative for cold intolerance.   Hematologic/Lymphatic: Does not bruise/bleed easily.  Musculoskeletal: Positive for back pain. Negative for joint swelling.  Gastrointestinal: Negative for abdominal pain, anorexia, change in bowel habit, hematochezia and melena.  Neurological: Negative for headaches and light-headedness.  Psychiatric/Behavioral: Negative for depression and substance abuse.  All other systems reviewed and are negative.  Objective   Vitals with BMI 08/22/2019 06/25/2019 06/25/2019  Height 5\' 8"  - -  Weight 245 lbs - -  BMI [id number] - -  Systolic 123456 123456 123456  Diastolic 79 67 62  Pulse 68 57 60      Physical Exam  Constitutional: He appears well-developed. No distress.  Morbidly obese  HENT:  Head: Atraumatic.  Eyes: Conjunctivae are normal.  Neck: Neck supple. No thyromegaly present.  Short neck and difficult to evaluate JVP  Cardiovascular: Normal rate, regular rhythm and normal heart sounds. Exam reveals no gallop.  No murmur heard. Pulses:      Carotid pulses are 2+ on the right side and 2+ on the left side.      Popliteal pulses are 2+ on the right side and 2+ on the left side.       Dorsalis pedis pulses are 0 on the right side and 0 on the left side.       Posterior tibial pulses are 0 on the right side and 2+ on the left side.  Femoral  pulse difficult to feel due to patient's body habitus.   1-2+ Pitting ankle edema bilateral. Chronic venostasis skin change noted.   Pulmonary/Chest: Effort normal and breath sounds normal.  Abdominal: Soft. Bowel sounds are normal.  Obese. Pannus present  Musculoskeletal: Normal range of motion.  Neurological: He is alert.  Skin: Skin is warm and dry.  Psychiatric: He has a normal mood and affect.   Laboratory examination:    Recent Labs    01/27/19 0853 06/18/19 1042 06/25/19 0254  NA 141 133* 132*  K 4.2 4.1 4.6  CL 105 97* 102  CO2 27 25 20*  GLUCOSE 154* 155* 312*  BUN 17 36* 31*  CREATININE 0.93 1.41* 1.13  CALCIUM 10.0 9.9 8.0*   GFRNONAA 81 48* >60  GFRAA 93 56* >60   CrCl cannot be calculated (Patient's most recent lab result is older than the maximum 21 days allowed.).  CMP Latest Ref Rng & Units 06/25/2019 06/18/2019 01/27/2019  Glucose 70 - 99 mg/dL 312(H) 155(H) 154(H)  BUN 8 - 23 mg/dL 31(H) 36(H) 17  Creatinine 0.61 - 1.24 mg/dL 1.13 1.41(H) 0.93  Sodium 135 - 145 mmol/L 132(L) 133(L) 141  Potassium 3.5 - 5.1 mmol/L 4.6 4.1 4.2  Chloride 98 - 111 mmol/L 102 97(L) 105  CO2 22 - 32 mmol/L 20(L) 25 27  Calcium 8.9 - 10.3 mg/dL 8.0(L) 9.9 10.0  Total Protein 6.1 - 8.1 g/dL - - 6.2  Total Bilirubin 0.2 - 1.2 mg/dL - - 0.4  Alkaline Phos 40 - 115 U/L - - -  AST 10 - 35 U/L - - 16  ALT 9 - 46 U/L - - 16   CBC Latest Ref Rng & Units 06/25/2019 06/18/2019 12/26/2016  WBC 4.0 - 10.5 K/uL 12.6(H) 8.7 8.9  Hemoglobin 13.0 - 17.0 g/dL 10.7(L) 14.6 15.2  Hematocrit 39.0 - 52.0 % 33.2(L) 43.5 44.8  Platelets 150 - 400 K/uL 237 300 361   Lipid Panel     Component Value Date/Time   CHOL 165 01/27/2019 0853   TRIG 185 (H) 01/27/2019 0853   HDL 49 01/27/2019 0853   CHOLHDL 3.4 01/27/2019 0853   VLDL 28 04/17/2016 0900   LDLCALC 88 01/27/2019 0853   HEMOGLOBIN A1C Lab Results  Component Value Date   HGBA1C 6.9 (H) 06/18/2019   MPG 151 06/18/2019   TSH Recent Labs    01/27/19 0853  TSH 2.84   Medications and allergies  No Known Allergies   Current Outpatient Medications  Medication Instructions  . apixaban (ELIQUIS) 5 mg, Oral, 2 times daily, For the first 7 days, take 2 tablets twice a day. Then begin 1 tablet twice a day.  . baclofen (LIORESAL) 10 mg, Oral, 3 times daily, As needed for muscle spasm  . BYSTOLIC 20 MG TABS TAKE (1) TABLET BY MOUTH ONCE DAILY.  . Calcium-Vitamin  D-Vitamin K 650-12.5-40 MG-MCG-MCG CHEW 2 tablets, Oral, Daily at bedtime  . gabapentin (NEURONTIN) 100 MG capsule TAKE (1) CAPSULE BY MOUTH TWICE DAILY.  Marland Kitchen HYDROcodone-acetaminophen (NORCO) 10-325 MG tablet 1 tablet, Oral, 4  times daily PRN  . Jardiance 12.5 mg, Oral, Daily  . lisinopril-hydrochlorothiazide (PRINZIDE,ZESTORETIC) 20-25 MG per tablet 1 tablet, Oral, Daily  . metFORMIN (GLUCOPHAGE) 1000 MG tablet TAKE (1) TABLET BY MOUTH TWICE DAILY.  . Multiple Vitamins-Minerals (ONE-A-DAY 50 PLUS PO) 1 tablet, Oral, Daily at bedtime  . nitroGLYCERIN (NITROSTAT) 0.4 mg, Sublingual, Every 5 min PRN  . omeprazole (PRILOSEC) 20 MG capsule 1 PO 30 MINS PRIOR TO BREAKFAST.  Marland Kitchen pravastatin (PRAVACHOL) 40 mg, Oral, Daily at bedtime  . sennosides-docusate sodium (SENOKOT-S) 8.6-50 MG tablet 2 tablets, Oral, Daily  . spironolactone (ALDACTONE) 50 mg, Oral, BH-each morning  . tamsulosin (FLOMAX) 0.4 mg, Oral, Daily  . torsemide (DEMADEX) 20 mg, Oral, Daily PRN  . traZODone (DESYREL) 100 mg, Oral, Daily at bedtime  . verapamil (CALAN-SR) 240 mg, Oral, Daily at bedtime  . vitamin B-12 (CYANOCOBALAMIN) 500 mcg, Oral, Daily  . Vitamin D, Ergocalciferol, (DRISDOL) 1.25 MG (50000 UT) CAPS capsule TAKE 1 CAPSULE BY MOUTH ONCE A WEEK.    Radiology:  No results found. Cardiac Studies:   Heart Cath 06/04/12: Ostial Ramus intermediate 2.75x12 Promus element DES placed. Has anamolous circumflex coronary artery from right coronary cusp.  Outside echo 02/23/12: Normal LVEF, 55%. Cannot exclude wall motion abnormality. Mild aortic calcification. No aortic stenosis.   Carotid artery duplex 07/2016: Mild bilateral common carotid, bifurcation, proximal ICA stenosis with mild diffuse plaque. Antegrade vertebral flow. No significant change from Carotid artery duplex 06/19/13  Lexiscan Myoview Stress Test 04/07/2019: Lexiscan stress test was performed. Stress EKG is non-diagnostic, as this is pharmacological stress test. There was baseline artifact as well making EKG difficult to interpret.  The perfusion imaging study demonstrates diaphragmatic attenuation artifact in the inferior wall without reversible ischemia or scar.  LV systolic function  was normal without wall motion abnormality and calculated at 70%. This is a low risk study.   Assessment     ICD-10-CM   1. Coronary artery disease of native artery of native heart with stable angina pectoris ([place])  I25.118 EKG 12-Lead  2. Essential hypertension  I10   3. Hypercholesteremia  E78.00   4. Diabetes mellitus type 2 with peripheral artery disease ([place])  E11.51     EKG 08/22/2019: Normal sinus rhythm at rate of 63 bpm, LAE, normal axis.  No evidence of ischemia.  Low-voltage complexes. Compared to EKG 08/31/2018: Sinus rhythm with 1st degree AV Block at rate of 75 bpm, right bundle branch block  Recommendations:  No orders of the defined types were placed in this encounter.   Patient is here on annual visit and follow-up of coronary artery disease, underwent right hip arthroplasty without periprocedural complication.  He has lost about 30 pounds in weight since last office visit in the past 6 months.  I encouraged him to continue to lose weight.  He has not had any worsening dyspnea, leg edema is very minimal, no clinical evidence of heart failure.  No change in his physical exam on EKG.  Labs are well controlled lipids and renal function is remained stable.  No changes in the medications were done today.  Blood pressure is also very well controlled. Diabetes is also improved with regard to A1c.  Although has peripheral arterial disease denies symptoms of claudication.  He has good  popliteal pulse, suspect small vessel disease from PAD from diabetes mellitus. I'll see him back on an annual basis.  Adrian Prows, MD, [facility] 08/22/2019, 12:15 PM [facility]. [place] Pager: [phone] Office: [phone] If no answer Cell [phone]

## 2019-09-05 ENCOUNTER — Other Ambulatory Visit: Payer: Self-pay | Admitting: Cardiology

## 2019-09-09 ENCOUNTER — Other Ambulatory Visit: Payer: PPO

## 2019-09-10 DIAGNOSIS — M48061 Spinal stenosis, lumbar region without neurogenic claudication: Secondary | ICD-10-CM | POA: Diagnosis not present

## 2019-09-10 DIAGNOSIS — M5126 Other intervertebral disc displacement, lumbar region: Secondary | ICD-10-CM | POA: Diagnosis not present

## 2019-09-10 DIAGNOSIS — M545 Low back pain: Secondary | ICD-10-CM | POA: Diagnosis not present

## 2019-09-12 DIAGNOSIS — M545 Low back pain: Secondary | ICD-10-CM | POA: Diagnosis not present

## 2019-09-12 DIAGNOSIS — M5126 Other intervertebral disc displacement, lumbar region: Secondary | ICD-10-CM | POA: Diagnosis not present

## 2019-09-12 DIAGNOSIS — M48061 Spinal stenosis, lumbar region without neurogenic claudication: Secondary | ICD-10-CM | POA: Diagnosis not present

## 2019-09-30 DIAGNOSIS — M48061 Spinal stenosis, lumbar region without neurogenic claudication: Secondary | ICD-10-CM | POA: Diagnosis not present

## 2019-10-08 ENCOUNTER — Ambulatory Visit (INDEPENDENT_AMBULATORY_CARE_PROVIDER_SITE_OTHER): Payer: PPO | Admitting: Internal Medicine

## 2019-10-08 ENCOUNTER — Encounter (INDEPENDENT_AMBULATORY_CARE_PROVIDER_SITE_OTHER): Payer: Self-pay | Admitting: Internal Medicine

## 2019-10-08 ENCOUNTER — Other Ambulatory Visit: Payer: Self-pay

## 2019-10-08 VITALS — BP 128/76 | HR 79 | Temp 97.6°F | Resp 15 | Ht 68.0 in | Wt 241.8 lb

## 2019-10-08 DIAGNOSIS — Z125 Encounter for screening for malignant neoplasm of prostate: Secondary | ICD-10-CM

## 2019-10-08 DIAGNOSIS — E782 Mixed hyperlipidemia: Secondary | ICD-10-CM

## 2019-10-08 DIAGNOSIS — I25118 Atherosclerotic heart disease of native coronary artery with other forms of angina pectoris: Secondary | ICD-10-CM

## 2019-10-08 DIAGNOSIS — I10 Essential (primary) hypertension: Secondary | ICD-10-CM | POA: Diagnosis not present

## 2019-10-08 DIAGNOSIS — E1159 Type 2 diabetes mellitus with other circulatory complications: Secondary | ICD-10-CM

## 2019-10-08 DIAGNOSIS — E559 Vitamin D deficiency, unspecified: Secondary | ICD-10-CM

## 2019-10-08 DIAGNOSIS — R7989 Other specified abnormal findings of blood chemistry: Secondary | ICD-10-CM | POA: Diagnosis not present

## 2019-10-08 NOTE — Patient Instructions (Signed)
[facility] Dietary Recommendations for Weight Loss What to Avoid . Avoid added sugars o Often added sugar can be found in processed foods such as many condiments, dry cereals, cakes, cookies, chips, crisps, crackers, candies, sweetened drinks, etc.  o Read labels and AVOID/DECREASE use of foods with the following in their ingredient list: Sugar, fructose, high fructose corn syrup, sucrose, glucose, maltose, dextrose, molasses, cane sugar, brown sugar, any type of syrup, agave nectar, etc.   . Avoid snacking in between meals . Avoid foods made with flour o If you are going to eat food made with flour, choose those made with whole-grains; and, minimize your consumption as much as is tolerable . Avoid processed foods o These foods are generally stocked in the middle of the grocery store. Focus on shopping on the perimeter of the grocery.  . Avoid Meat  o We recommend following a plant-based diet at [facility]. Thus, we recommend avoiding meat as a general rule. Consider eating beans, legumes, eggs, and/or dairy products for regular protein sources o If you plan on eating meat limit to 4 ounces of meat at a time and choose lean options such as Fish, chicken, turkey. Avoid red meat intake such as pork and/or steak What to Include . Vegetables o GREEN LEAFY VEGETABLES: Kale, spinach, mustard greens, collard greens, cabbage, broccoli, etc. o OTHER: Asparagus, cauliflower, eggplant, carrots, peas, Brussel sprouts, tomatoes, bell peppers, zucchini, beets, cucumbers, etc. . Grains, seeds, and legumes o Beans: kidney beans, black eyed peas, garbanzo beans, black beans, pinto beans, etc. o Whole, unrefined grains: brown rice, barley, bulgur, oatmeal, etc. . Healthy fats  o Avoid highly processed fats such as vegetable oil o Examples of healthy fats: avocado, olives, virgin olive oil, dark chocolate (?72% Cocoa), nuts (peanuts, almonds, walnuts, cashews, pecans, etc.) . None to Low  Intake of Animal Sources of Protein o Meat sources: chicken, turkey, salmon, tuna. Limit to 4 ounces of meat at one time. o Consider limiting dairy sources, but when choosing dairy focus on: PLAIN Greek yogurt, cottage cheese, high-protein milk . Fruit o Choose berries  When to Eat . Intermittent Fasting: o Choosing not to eat for a specific time period, but DO FOCUS ON HYDRATION when fasting o Multiple Techniques: - Time Restricted Eating: eat 3 meals in a day, each meal lasting no more than 60 minutes, no snacks between meals - 16-18 hour fast: fast for 16 to 18 hours up to 7 days a week. Often suggested to start with 2-3 nonconsecutive days per week.  . Remember the time you sleep is counted as fasting.  . Examples of eating schedule: Fast from 7:00pm-11:00am. Eat between 11:00am-7:00pm.  - 24-hour fast: fast for 24 hours up to every other day. Often suggested to start with 1 day per week . Remember the time you sleep is counted as fasting . Examples of eating schedule:  o Eating day: eat 2-3 meals on your eating day. If doing 2 meals, each meal should last no more than 90 minutes. If doing 3 meals, each meal should last no more than 60 minutes. Finish last meal by 7:00pm. o Fasting day: Fast until 7:00pm.  o IF YOU FEEL UNWELL FOR ANY REASON/IN ANY WAY WHEN FASTING, STOP FASTING BY EATING A NUTRITIOUS SNACK OR LIGHT MEAL o ALWAYS FOCUS ON HYDRATION DURING FASTS - Acceptable Hydration sources: water, broths, tea/coffee (black tea/coffee is best but using a small amount of whole-fat dairy products in coffee/tea is acceptable).  -   Poor Hydration Sources: anything with sugar or artificial sweeteners added to it  These recommendations have been developed for patients that are actively receiving medical care from either Dr. Chrstopher Malenfant or Sarah Gray, DNP, NP-C at [facility]. These recommendations are developed for patients with specific medical conditions and are not meant to be  distributed or used by others that are not actively receiving care from either provider listed above at [facility]. It is not appropriate to participate in the above eating plans without proper medical supervision.   Reference: Fung, J. The obesity code. [place]: [place]; 2016.   

## 2019-10-08 NOTE — Progress Notes (Signed)
Metrics: Intervention Frequency ACO  Documented Smoking Status Yearly  Screened one or more times in 24 months  Cessation Counseling or  Active cessation medication Past 24 months  Past 24 months   Guideline developer: UpToDate (See UpToDate for funding source) Date Released: 2014       Wellness Office Visit  Subjective:  Patient ID: Omar [name], male    DOB: Apr 12, 1946  Age: 74 y.o. MRN: [id number]  CC: This 74 year old man comes to our practice as a new patient to be established.  His primary care physician, Dr. Sinda Du is now retired. HPI The patient has a history of coronary artery disease and follows cardiology. He also has a history of DVT and apparently has factor V Leiden mutation. He also was on testosterone therapy in the past for low testosterone and this was discontinued when he had DVT.  He is now on anticoagulation. He is also diabetic on medications. He is also hypertensive and is on medications for this. He previously weighed over 300 pounds but has managed to lose weight by eating less food and he typically has meals twice a day but does have snacks.  Past Medical History:  Diagnosis Date  . Abnormal EKG    Inferior Q waves  . BPH (benign prostatic hyperplasia)   . Chronic anticoagulation 01/16/2017  . Coronary artery disease   . Degenerative joint disease   . Diabetes mellitus, type 2 (HCC)    No insulin  . DVT (deep venous thrombosis) ([place]) 4 -5 yrs ago  . DVT of leg (deep venous thrombosis) ([place]) 07/02/2015  . Heart murmur   . Hepatic steatosis   . Heterozygous factor V Leiden mutation ([place]) 01/16/2017  . Hyperlipidemia   . 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 Meningioma (HCC)    Left frontal; CVA identified by MRI; no neurologic symptoms  . Neuropathy   . Obesity   . Primary localized osteoarthritis of right hip 06/24/2019  . Sleep apnea 2008   Dr. Merlene Laughter  interpreted the study-CPAP recommended, but refused by patient  . Stroke [facility])    no deficits from stroke, "Didnt know I had one when they say I did".      Family History  Problem Relation Age of Onset  . Cancer Mother   . Cancer Father   . Coronary artery disease Neg Hx     Social History   Social History Narrative   Married for 3 yrs,lives with wife.Works part-time with [employer].   Social History   Tobacco Use  . Smoking status: Never Smoker  . Smokeless tobacco: Never Used  Substance Use Topics  . Alcohol use: No    Current Meds  Medication Sig  . apixaban (ELIQUIS) 5 MG TABS tablet Take 1 tablet (5 mg total) by mouth 2 (two) times daily. For the first 7 days, take 2 tablets twice a day. Then begin 1 tablet twice a day. (Patient taking differently: Take 5 mg by mouth 2 (two) times daily. )  . baclofen (LIORESAL) 10 MG tablet Take 1 tablet (10 mg total) by mouth 3 (three) times daily. As needed for muscle spasm  . BYSTOLIC 20 MG TABS TAKE (1) TABLET BY MOUTH ONCE DAILY.  . Calcium-Vitamin D-Vitamin K 650-12.5-40 MG-MCG-MCG CHEW Chew 2 tablets by mouth at bedtime.  . empagliflozin (JARDIANCE) 25 MG TABS tablet Take 12.5 mg by mouth daily.  Marland Kitchen gabapentin (NEURONTIN) 100  MG capsule TAKE (1) CAPSULE BY MOUTH TWICE DAILY. (Patient taking differently: Take 100 mg by mouth 2 (two) times daily. )  . HYDROcodone-acetaminophen (NORCO) 10-325 MG tablet Take 1 tablet by mouth 4 (four) times daily as needed.  Marland Kitchen lisinopril-hydrochlorothiazide (PRINZIDE,ZESTORETIC) 20-25 MG per tablet Take 1 tablet by mouth daily.  . metFORMIN (GLUCOPHAGE) 1000 MG tablet TAKE (1) TABLET BY MOUTH TWICE DAILY. (Patient taking differently: Take 1,000 mg by mouth 2 (two) times a day. )  . Multiple Vitamins-Minerals (ONE-A-DAY 50 PLUS PO) Take 1 tablet by mouth at bedtime.   . nitroGLYCERIN (NITROSTAT) 0.4 MG SL tablet Place 1 tablet (0.4 mg total) under the tongue every 5 (five) minutes as  needed for chest pain.  Marland Kitchen omeprazole (PRILOSEC) 20 MG capsule 1 PO 30 MINS PRIOR TO BREAKFAST. (Patient taking differently: Take 20 mg by mouth daily before breakfast. )  . pravastatin (PRAVACHOL) 40 MG tablet Take 40 mg by mouth at bedtime.   . sennosides-docusate sodium (SENOKOT-S) 8.6-50 MG tablet Take 2 tablets by mouth daily.  Marland Kitchen spironolactone (ALDACTONE) 50 MG tablet Take 1 tablet (50 mg total) by mouth every morning.  . tamsulosin (FLOMAX) 0.4 MG CAPS capsule Take 0.4 mg by mouth daily.  Marland Kitchen torsemide (DEMADEX) 20 MG tablet Take 20 mg by mouth daily as needed (swelling).   . traZODone (DESYREL) 100 MG tablet Take 100 mg by mouth at bedtime.  . verapamil (CALAN-SR) 240 MG CR tablet Take 1 tablet (240 mg total) by mouth at bedtime.  . vitamin B-12 (CYANOCOBALAMIN) 500 MCG tablet Take 500 mcg by mouth daily.  . Vitamin D, Ergocalciferol, (DRISDOL) 1.25 MG (50000 UT) CAPS capsule TAKE 1 CAPSULE BY MOUTH ONCE A WEEK.       Objective:   Today's Vitals: BP 128/76   Pulse 79   Temp 97.6 F (36.4 C) (Temporal)   Resp 15   Ht 5' 8"  (1.727 m)   Wt 241 lb 12.8 oz (109.7 kg)   SpO2 97%   BMI 36.77 kg/m  Vitals with BMI 10/08/2019 08/22/2019 06/25/2019  Height 5' 8"  5' 8"  -  Weight 241 lbs 13 oz 245 lbs -  BMI 62.69 48.54 -  Systolic 627 035 009  Diastolic 76 79 67  Pulse 79 68 57     Physical Exam  He is morbidly obese.  Blood pressure is controlled.  He is alert and orientated without any obvious focal neurological signs.     Assessment   1. Essential hypertension   2. Type 2 diabetes mellitus with vascular disease ([place])   3. Coronary artery disease of native artery of native heart with stable angina pectoris ([place])   4. Vitamin D deficiency   5. Mixed hyperlipidemia   6. Morbid obesity ([place])   7. Low testosterone in male   8. Special screening for malignant neoplasm of prostate       Tests ordered Orders Placed This Encounter  Procedures  . CMP with eGFR([facility])  .  Hemoglobin A1c  . Lipid Panel  . Vitamin D, 25-hydroxy  . T3, Free  . T4  . TSH  . PSA  . Testosterone Total,Free,Bio, Males     Plan: 1. Blood work is ordered above. 2. He will continue with diabetic medications as before and we will check an A1c. 3. I will check testosterone levels but if he has factor V Leiden mutation, I wonder if he really is a candidate for testosterone therapy at this point. 4. He was given  Prevnar 13 vaccination today. 5. Further recommendations will depend on blood results.  I discussed with him fasting intermittently as well as a low carbohydrate, moderate protein and high fat diet. 6. Follow-up in 3 months. 7. Today I spent 45 minutes with this patient   No orders of the defined types were placed in this encounter.   Doree Albee, MD

## 2019-10-09 LAB — COMPLETE METABOLIC PANEL WITH GFR
AG Ratio: 2 (calc) (ref 1.0–2.5)
ALT: 12 U/L (ref 9–46)
AST: 11 U/L (ref 10–35)
Albumin: 4.3 g/dL (ref 3.6–5.1)
Alkaline phosphatase (APISO): 48 U/L (ref 35–144)
BUN: 22 mg/dL (ref 7–25)
CO2: 24 mmol/L (ref 20–32)
Calcium: 10 mg/dL (ref 8.6–10.3)
Chloride: 102 mmol/L (ref 98–110)
Creat: 1 mg/dL (ref 0.70–1.18)
GFR, Est African American: 85 mL/min/{1.73_m2} (ref >=60)
GFR, Est Non African American: 73 mL/min/{1.73_m2} (ref >=60)
Globulin: 2.2 g/dL (calc) (ref 1.9–3.7)
Glucose, Bld: 152 mg/dL — ABNORMAL HIGH (ref 65–99)
Potassium: 4.2 mmol/L (ref 3.5–5.3)
Sodium: 139 mmol/L (ref 135–146)
Total Bilirubin: 0.3 mg/dL (ref 0.2–1.2)
Total Protein: 6.5 g/dL (ref 6.1–8.1)

## 2019-10-09 LAB — T4: T4, Total: 8.1 ug/dL (ref 4.9–10.5)

## 2019-10-09 LAB — LIPID PANEL
Cholesterol: 196 mg/dL (ref <200)
HDL: 55 mg/dL (ref >=40)
LDL Cholesterol (Calc): 111 mg/dL (calc) — ABNORMAL HIGH
Non-HDL Cholesterol (Calc): 141 mg/dL (calc) — ABNORMAL HIGH (ref <130)
Total CHOL/HDL Ratio: 3.6 (calc) (ref <5.0)
Triglycerides: 183 mg/dL — ABNORMAL HIGH (ref <150)

## 2019-10-09 LAB — HEMOGLOBIN A1C
Hgb A1c MFr Bld: 7.8 % of total Hgb — ABNORMAL HIGH (ref <5.7)
Mean Plasma Glucose: 177 (calc)
eAG (mmol/L): 9.8 (calc)

## 2019-10-09 LAB — TSH: TSH: 0.88 mIU/L (ref 0.40–4.50)

## 2019-10-09 LAB — T3, FREE: T3, Free: 2.9 pg/mL (ref 2.3–4.2)

## 2019-10-09 LAB — VITAMIN D 25 HYDROXY (VIT D DEFICIENCY, FRACTURES): Vit D, 25-Hydroxy: 38 ng/mL (ref 30–100)

## 2019-10-14 ENCOUNTER — Other Ambulatory Visit: Payer: Self-pay | Admitting: Cardiology

## 2019-10-23 ENCOUNTER — Ambulatory Visit: Payer: PPO | Attending: Internal Medicine

## 2019-10-23 ENCOUNTER — Telehealth (INDEPENDENT_AMBULATORY_CARE_PROVIDER_SITE_OTHER): Payer: Self-pay

## 2019-10-23 DIAGNOSIS — Z23 Encounter for immunization: Secondary | ICD-10-CM

## 2019-10-23 NOTE — Telephone Encounter (Signed)
Omar [name] that she is waiting to hear regarding Test results

## 2019-10-23 NOTE — Progress Notes (Signed)
   Covid-19 Vaccination Clinic  Name:  Omar [name]    MRN: [id number] DOB: 03/17/1946  10/23/2019  Omar [name] was observed post Covid-19 immunization for 15 minutes without incidence. He was provided with Vaccine Information Sheet and instruction to access the V-Safe system.   Omar [name] was instructed to call 911 with any severe reactions post vaccine: Marland Kitchen Difficulty breathing  . Swelling of your face and throat  . A fast heartbeat  . A bad rash all over your body  . Dizziness and weakness    Immunizations Administered    Name Date Dose VIS Date Route   Pfizer COVID-19 Vaccine 10/23/2019  4:25 PM 0.3 mL 09/12/2019 Intramuscular   Manufacturer: [place]   Lot: [id number]   [place]: [id number]

## 2019-10-27 NOTE — Telephone Encounter (Signed)
Yes I have reviewed but we do not have all the results back.  I will send a message regarding what we do have.  I do not know why we do not have the testosterone and PSA levels yet- this is unusual and so we will have to probably redraw them.

## 2019-10-28 ENCOUNTER — Encounter (INDEPENDENT_AMBULATORY_CARE_PROVIDER_SITE_OTHER): Payer: Self-pay | Admitting: Internal Medicine

## 2019-10-28 ENCOUNTER — Other Ambulatory Visit (INDEPENDENT_AMBULATORY_CARE_PROVIDER_SITE_OTHER): Payer: Self-pay | Admitting: Internal Medicine

## 2019-10-28 DIAGNOSIS — Z125 Encounter for screening for malignant neoplasm of prostate: Secondary | ICD-10-CM

## 2019-10-28 DIAGNOSIS — R7989 Other specified abnormal findings of blood chemistry: Secondary | ICD-10-CM

## 2019-10-29 ENCOUNTER — Encounter (INDEPENDENT_AMBULATORY_CARE_PROVIDER_SITE_OTHER): Payer: Self-pay | Admitting: Internal Medicine

## 2019-11-04 ENCOUNTER — Other Ambulatory Visit (INDEPENDENT_AMBULATORY_CARE_PROVIDER_SITE_OTHER): Payer: PPO

## 2019-11-04 ENCOUNTER — Other Ambulatory Visit: Payer: Self-pay

## 2019-11-04 DIAGNOSIS — Z125 Encounter for screening for malignant neoplasm of prostate: Secondary | ICD-10-CM | POA: Diagnosis not present

## 2019-11-04 DIAGNOSIS — R7989 Other specified abnormal findings of blood chemistry: Secondary | ICD-10-CM | POA: Diagnosis not present

## 2019-11-05 LAB — TESTOSTERONE TOTAL,FREE,BIO, MALES
Albumin: 4.1 g/dL (ref 3.6–5.1)
Sex Hormone Binding: 27 nmol/L (ref 22–77)
Testosterone: 69 ng/dL — ABNORMAL LOW (ref 250–827)

## 2019-11-05 LAB — PSA: PSA: 0.7 ng/mL (ref <=4.0)

## 2019-11-14 ENCOUNTER — Ambulatory Visit: Payer: PPO | Attending: Internal Medicine

## 2019-11-14 DIAGNOSIS — Z23 Encounter for immunization: Secondary | ICD-10-CM | POA: Insufficient documentation

## 2019-11-14 NOTE — Progress Notes (Signed)
   Covid-19 Vaccination Clinic  Name:  Omar [name]    MRN: [id number] DOB: [birth year]  11/14/2019  [name] was observed post Covid-19 immunization for 15 minutes without incidence. He was provided with Vaccine Information Sheet and instruction to access the V-Safe system.   [name] was instructed to call 911 with any severe reactions post vaccine: Marland Kitchen Difficulty breathing  . Swelling of your face and throat  . A fast heartbeat  . A bad rash all over your body  . Dizziness and weakness    Immunizations Administered    Name Date Dose VIS Date Route   Pfizer COVID-19 Vaccine 11/14/2019  4:30 PM 0.3 mL 09/12/2019 Intramuscular   Manufacturer: [place]   Lot: [id number]   [place]: [id number]

## 2019-12-23 ENCOUNTER — Other Ambulatory Visit: Payer: Self-pay | Admitting: Cardiology

## 2019-12-24 ENCOUNTER — Ambulatory Visit (INDEPENDENT_AMBULATORY_CARE_PROVIDER_SITE_OTHER): Payer: PPO | Admitting: Internal Medicine

## 2019-12-24 ENCOUNTER — Encounter (INDEPENDENT_AMBULATORY_CARE_PROVIDER_SITE_OTHER): Payer: Self-pay | Admitting: Internal Medicine

## 2019-12-24 DIAGNOSIS — R7989 Other specified abnormal findings of blood chemistry: Secondary | ICD-10-CM

## 2019-12-24 NOTE — Progress Notes (Signed)
Metrics: Intervention Frequency ACO  Documented Smoking Status Yearly  Screened one or more times in 24 months  Cessation Counseling or  Active cessation medication Past 24 months  Past 24 months   Guideline developer: UpToDate (See UpToDate for funding source) Date Released: 2014       Wellness Office Visit  Subjective:  Patient ID: Omar [name], male    DOB: 1945-12-27  Age: 74 y.o. MRN: [id number]  CC: This is an audio telemedicine visit with the patient who is at home and I am in my office.  I used 2 identifiers to identify the patient. This visit is to discuss low testosterone levels. HPI  The patient describes extreme fatigue and his testosterone level is only 69.  He does have a history of meningioma which was not causing any brain effects in 2007. He was treated with testosterone therapy in the past but when he got thromboembolic disease, this was discontinued.  At that time apparently he was also found to have factor V Leiden mutation which can certainly be a risk factor for thromboembolic disease independently. Past Medical History:  Diagnosis Date  . Abnormal EKG    Inferior Q waves  . BPH (benign prostatic hyperplasia)   . Chronic anticoagulation 01/16/2017  . Coronary artery disease   . Degenerative joint disease   . Diabetes mellitus, type 2 (HCC)    No insulin  . DVT (deep venous thrombosis) ([place]) 4 -5 yrs ago  . DVT of leg (deep venous thrombosis) ([place]) 07/02/2015  . Heart murmur   . Hepatic steatosis   . Heterozygous factor V Leiden mutation ([place]) 01/16/2017  . Hyperlipidemia   . 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 Meningioma (HCC)    Left frontal; CVA identified by MRI; no neurologic symptoms  . Neuropathy   . Obesity   . Primary localized osteoarthritis of right hip 06/24/2019  . Sleep apnea 2008   Dr. Merlene Laughter interpreted the study-CPAP recommended, but refused by patient  .  Stroke [facility])    no deficits from stroke, "Didnt know I had one when they say I did".      Family History  Problem Relation Age of Onset  . Cancer Mother   . Cancer Father   . Coronary artery disease Neg Hx     Social History   Social History Narrative   Married for 85 yrs,lives with wife.Works part-time with [employer].   Social History   Tobacco Use  . Smoking status: Never Smoker  . Smokeless tobacco: Never Used  Substance Use Topics  . Alcohol use: No    Current Meds  Medication Sig  . apixaban (ELIQUIS) 5 MG TABS tablet Take 1 tablet (5 mg total) by mouth 2 (two) times daily. For the first 7 days, take 2 tablets twice a day. Then begin 1 tablet twice a day. (Patient taking differently: Take 5 mg by mouth 2 (two) times daily. )  . baclofen (LIORESAL) 10 MG tablet Take 1 tablet (10 mg total) by mouth 3 (three) times daily. As needed for muscle spasm  . BYSTOLIC 20 MG TABS TAKE (1) TABLET BY MOUTH ONCE DAILY.  . Calcium-Vitamin D-Vitamin K 650-12.5-40 MG-MCG-MCG CHEW Chew 2 tablets by mouth at bedtime.  . empagliflozin (JARDIANCE) 25 MG TABS tablet Take 12.5 mg by mouth daily.  Marland Kitchen gabapentin (NEURONTIN) 100 MG capsule TAKE (1) CAPSULE BY MOUTH TWICE DAILY. (Patient taking differently:  Take 100 mg by mouth 2 (two) times daily. )  . HYDROcodone-acetaminophen (NORCO) 10-325 MG tablet Take 1 tablet by mouth 4 (four) times daily as needed.  Marland Kitchen lisinopril-hydrochlorothiazide (PRINZIDE,ZESTORETIC) 20-25 MG per tablet Take 1 tablet by mouth daily.  . metFORMIN (GLUCOPHAGE) 1000 MG tablet TAKE (1) TABLET BY MOUTH TWICE DAILY. (Patient taking differently: Take 1,000 mg by mouth 2 (two) times a day. )  . Multiple Vitamins-Minerals (ONE-A-DAY 50 PLUS PO) Take 1 tablet by mouth at bedtime.   . nitroGLYCERIN (NITROSTAT) 0.4 MG SL tablet Place 1 tablet (0.4 mg total) under the tongue every 5 (five) minutes as needed for chest pain.  Marland Kitchen omeprazole (PRILOSEC) 20 MG capsule 1  PO 30 MINS PRIOR TO BREAKFAST. (Patient taking differently: Take 20 mg by mouth daily before breakfast. )  . pravastatin (PRAVACHOL) 40 MG tablet Take 40 mg by mouth at bedtime.   . sennosides-docusate sodium (SENOKOT-S) 8.6-50 MG tablet Take 2 tablets by mouth daily.  Marland Kitchen spironolactone (ALDACTONE) 50 MG tablet Take 1 tablet (50 mg total) by mouth every morning.  . tamsulosin (FLOMAX) 0.4 MG CAPS capsule Take 0.4 mg by mouth daily.  Marland Kitchen torsemide (DEMADEX) 20 MG tablet Take 20 mg by mouth daily as needed (swelling).   . traZODone (DESYREL) 100 MG tablet Take 100 mg by mouth at bedtime.  . verapamil (CALAN-SR) 240 MG CR tablet Take 1 tablet (240 mg total) by mouth at bedtime.  . vitamin B-12 (CYANOCOBALAMIN) 500 MCG tablet Take 500 mcg by mouth daily.  . Vitamin D, Ergocalciferol, (DRISDOL) 1.25 MG (50000 UT) CAPS capsule TAKE 1 CAPSULE BY MOUTH ONCE A WEEK.     Objective:   Today's Vitals: There were no vitals taken for this visit. Vitals with BMI 12/24/2019 10/08/2019 08/22/2019  Height (No Data) 5\' 8"  5\' 8"   Weight (No Data) 241 lbs 13 oz 245 lbs  BMI - 0000000 [id number]  Systolic (No Data) 0000000 [id number]  Diastolic (No Data) 76 79  Pulse - 79 68     Physical Exam  His speech on the phone was normal and he appeared to be alert and orientated.     Assessment   1. Low testosterone in male       Tests ordered Orders Placed This Encounter  Procedures  . MR Brain Wo Contrast     Plan: 1. I discussed his low testosterone level and as it is extremely low and below 300, we will get an MRI scan of the brain to make sure there are no brain lesions accounting for this first. 2. I discussed with him the association between factor V Leiden mutation and increased risk of thromboembolic disease.  I also told him that there have been no good studies showing a causative factor of thromboembolic disease from testosterone therapy.  We will eventually be his decision to make.  He is already on  anticoagulation therapy for life. 3. I will see him in April and by that time we will obtain the MRI brain scan to discuss further testosterone therapy as well as other problems such as his diabetes. 4. This phone call lasted 9 minutes and 42 seconds.   No orders of the defined types were placed in this encounter.   Doree Albee, MD

## 2020-01-02 ENCOUNTER — Encounter (INDEPENDENT_AMBULATORY_CARE_PROVIDER_SITE_OTHER): Payer: Self-pay | Admitting: Internal Medicine

## 2020-01-05 ENCOUNTER — Encounter (INDEPENDENT_AMBULATORY_CARE_PROVIDER_SITE_OTHER): Payer: Self-pay | Admitting: Internal Medicine

## 2020-01-08 ENCOUNTER — Ambulatory Visit (INDEPENDENT_AMBULATORY_CARE_PROVIDER_SITE_OTHER): Payer: PPO | Admitting: Internal Medicine

## 2020-01-21 ENCOUNTER — Ambulatory Visit (INDEPENDENT_AMBULATORY_CARE_PROVIDER_SITE_OTHER): Payer: PPO | Admitting: Internal Medicine

## 2020-01-22 ENCOUNTER — Ambulatory Visit (INDEPENDENT_AMBULATORY_CARE_PROVIDER_SITE_OTHER): Payer: PPO | Admitting: Internal Medicine

## 2020-01-27 ENCOUNTER — Ambulatory Visit (INDEPENDENT_AMBULATORY_CARE_PROVIDER_SITE_OTHER): Payer: PPO | Admitting: Internal Medicine

## 2020-01-29 ENCOUNTER — Ambulatory Visit (HOSPITAL_COMMUNITY): Payer: PPO

## 2020-02-02 ENCOUNTER — Ambulatory Visit (INDEPENDENT_AMBULATORY_CARE_PROVIDER_SITE_OTHER): Payer: PPO | Admitting: Internal Medicine

## 2020-02-04 ENCOUNTER — Other Ambulatory Visit: Payer: Self-pay

## 2020-02-04 ENCOUNTER — Ambulatory Visit (HOSPITAL_COMMUNITY)
Admission: RE | Admit: 2020-02-04 | Discharge: 2020-02-04 | Disposition: A | Discharge status: Home / Self Care | Payer: PPO | Source: Ambulatory Visit | Attending: Internal Medicine | Admitting: Internal Medicine

## 2020-02-04 DIAGNOSIS — R5383 Other fatigue: Secondary | ICD-10-CM | POA: Diagnosis not present

## 2020-02-04 DIAGNOSIS — R7989 Other specified abnormal findings of blood chemistry: Secondary | ICD-10-CM | POA: Insufficient documentation

## 2020-02-04 DIAGNOSIS — E291 Testicular hypofunction: Secondary | ICD-10-CM | POA: Diagnosis not present

## 2020-02-05 ENCOUNTER — Other Ambulatory Visit (INDEPENDENT_AMBULATORY_CARE_PROVIDER_SITE_OTHER): Payer: Self-pay | Admitting: Internal Medicine

## 2020-02-05 ENCOUNTER — Telehealth (INDEPENDENT_AMBULATORY_CARE_PROVIDER_SITE_OTHER): Payer: Self-pay

## 2020-02-05 MED ORDER — DIAZEPAM 5 MG PO TABS
5.0000 mg | ORAL_TABLET | Freq: Two times a day (BID) | ORAL | 1 refills | Status: DC | PRN
Start: 2020-02-05 — End: 2020-02-09

## 2020-02-05 NOTE — Telephone Encounter (Signed)
Carlyon Shadow is calling on behalf of her husband Omar [name] she states that he has been called back in for a repeat MRI of the Brain and they are asking if Dr. Anastasio Champion would call him in a Diazepam 5 mg to help relax him while he is in the MRI machine, please advise?

## 2020-02-05 NOTE — Telephone Encounter (Signed)
Please let patient know that I have called the diazepam tablets to [place].

## 2020-02-05 NOTE — Telephone Encounter (Signed)
Omar [name] is aware

## 2020-02-09 ENCOUNTER — Ambulatory Visit (INDEPENDENT_AMBULATORY_CARE_PROVIDER_SITE_OTHER): Payer: PPO | Admitting: Internal Medicine

## 2020-02-09 ENCOUNTER — Encounter (INDEPENDENT_AMBULATORY_CARE_PROVIDER_SITE_OTHER): Payer: Self-pay | Admitting: Internal Medicine

## 2020-02-09 ENCOUNTER — Other Ambulatory Visit: Payer: Self-pay

## 2020-02-09 VITALS — BP 133/72 | HR 65 | Temp 97.5°F | Resp 18 | Ht 66.0 in | Wt 254.0 lb

## 2020-02-09 DIAGNOSIS — R5383 Other fatigue: Secondary | ICD-10-CM | POA: Diagnosis not present

## 2020-02-09 DIAGNOSIS — R7989 Other specified abnormal findings of blood chemistry: Secondary | ICD-10-CM | POA: Diagnosis not present

## 2020-02-09 DIAGNOSIS — R5381 Other malaise: Secondary | ICD-10-CM

## 2020-02-09 DIAGNOSIS — E1159 Type 2 diabetes mellitus with other circulatory complications: Secondary | ICD-10-CM

## 2020-02-09 MED ORDER — THYROID 60 MG PO TABS
60.0000 mg | ORAL_TABLET | Freq: Every day | ORAL | 3 refills | Status: DC
Start: 2020-02-09 — End: 2022-03-31

## 2020-02-09 NOTE — Progress Notes (Signed)
Metrics: Intervention Frequency ACO  Documented Smoking Status Yearly  Screened one or more times in 24 months  Cessation Counseling or  Active cessation medication Past 24 months  Past 24 months   Guideline developer: UpToDate (See UpToDate for funding source) Date Released: 2014       Wellness Office Visit  Subjective:  Patient ID: Omar [name], male    DOB: 05-Jun-1946  Age: 74 y.o. MRN: [id number]  CC: This man comes in to discuss testosterone therapy and his other medical problems. 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  Also, the meningioma is seen as before, smaller size now with calcification as before. He has gained some weight since last time he was seen physically in the office. He continues to complain of poor quality of life with immense fatigue, poor motivation and focus and concentration. Past Medical History:  Diagnosis Date  . Abnormal EKG    Inferior Q waves  . BPH (benign prostatic hyperplasia)   . Chronic anticoagulation 01/16/2017  . Coronary artery disease   . Degenerative joint disease   . Diabetes mellitus, type 2 (HCC)    No insulin  . DVT (deep venous thrombosis) ([place]) 4 -5 yrs ago  . DVT of leg (deep venous thrombosis) ([place]) 07/02/2015  . Heart murmur   . Hepatic steatosis   . Heterozygous factor V Leiden mutation ([place]) 01/16/2017  . Hyperlipidemia   . 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 Meningioma (HCC)    Left frontal; CVA identified by MRI; no neurologic symptoms  . Neuropathy   . Obesity   . Primary localized osteoarthritis of right hip 06/24/2019  . Sleep apnea 2008   Dr. Merlene Laughter interpreted the study-CPAP recommended, but refused by patient  . Stroke [facility])    no deficits from stroke, "Didnt know I had one when they say I did".      Family History  Problem Relation Age of Onset  .  Cancer Mother   . Cancer Father   . Coronary artery disease Neg Hx     Social History   Social History Narrative   Married for 80 yrs,lives with wife.Works part-time with [employer].   Social History   Tobacco Use  . Smoking status: Never Smoker  . Smokeless tobacco: Never Used  Substance Use Topics  . Alcohol use: No    Current Meds  Medication Sig  . apixaban (ELIQUIS) 5 MG TABS tablet Take 1 tablet (5 mg total) by mouth 2 (two) times daily. For the first 7 days, take 2 tablets twice a day. Then begin 1 tablet twice a day. (Patient taking differently: Take 5 mg by mouth 2 (two) times daily. )  . baclofen (LIORESAL) 10 MG tablet Take 1 tablet (10 mg total) by mouth 3 (three) times daily. As needed for muscle spasm  . BYSTOLIC 20 MG TABS TAKE (1) TABLET BY MOUTH ONCE DAILY.  . Calcium-Vitamin D-Vitamin K 650-12.5-40 MG-MCG-MCG CHEW Chew 2 tablets by mouth at bedtime.  . empagliflozin (JARDIANCE) 25 MG TABS tablet Take 12.5 mg by mouth daily.  Marland Kitchen gabapentin (NEURONTIN) 100 MG capsule TAKE (1) CAPSULE BY MOUTH TWICE DAILY. (Patient taking differently: Take 100 mg by mouth 2 (two) times daily. )  . lisinopril-hydrochlorothiazide (PRINZIDE,ZESTORETIC) 20-25 MG per tablet Take 1 tablet by mouth daily.  . metFORMIN (GLUCOPHAGE) 1000  MG tablet TAKE (1) TABLET BY MOUTH TWICE DAILY. (Patient taking differently: Take 1,000 mg by mouth 2 (two) times a day. )  . Multiple Vitamins-Minerals (ONE-A-DAY 50 PLUS PO) Take 1 tablet by mouth at bedtime.   . nitroGLYCERIN (NITROSTAT) 0.4 MG SL tablet Place 1 tablet (0.4 mg total) under the tongue every 5 (five) minutes as needed for chest pain.  Marland Kitchen omeprazole (PRILOSEC) 20 MG capsule 1 PO 30 MINS PRIOR TO BREAKFAST. (Patient taking differently: Take 20 mg by mouth daily before breakfast. )  . pravastatin (PRAVACHOL) 40 MG tablet Take 40 mg by mouth at bedtime.   . sennosides-docusate sodium (SENOKOT-S) 8.6-50 MG tablet Take 2 tablets by  mouth daily.  Marland Kitchen spironolactone (ALDACTONE) 50 MG tablet Take 1 tablet (50 mg total) by mouth every morning.  . tamsulosin (FLOMAX) 0.4 MG CAPS capsule Take 0.4 mg by mouth daily.  Marland Kitchen torsemide (DEMADEX) 20 MG tablet Take 20 mg by mouth daily as needed (swelling).   . traZODone (DESYREL) 100 MG tablet Take 100 mg by mouth at bedtime.  . verapamil (CALAN-SR) 240 MG CR tablet Take 1 tablet (240 mg total) by mouth at bedtime.  . vitamin B-12 (CYANOCOBALAMIN) 500 MCG tablet Take 500 mcg by mouth daily.  . Vitamin D, Ergocalciferol, (DRISDOL) 1.25 MG (50000 UT) CAPS capsule TAKE 1 CAPSULE BY MOUTH ONCE A WEEK.       Objective:   Today's Vitals: BP 133/72 (BP Location: Right Arm, Patient Position: Sitting, Cuff Size: Normal)   Pulse 65   Temp (!) 97.5 F (36.4 C) (Temporal)   Resp 18   Ht 5\' 6"  (1.676 m)   Wt 254 lb (115.2 kg)   SpO2 94%   BMI 41.00 kg/m  Vitals with BMI 02/09/2020 12/24/2019 10/08/2019  Height 5\' 6"  (No Data) 5\' 8"   Weight 254 lbs (No Data) 241 lbs 13 oz  BMI [id number] - 0000000  Systolic Q000111Q (No Data) 0000000  Diastolic 72 (No Data) 76  Pulse 65 - 79     Physical Exam   He is now morbidly obese and has gained about 13 pounds since the last visit in January.    Assessment   1. Low testosterone in male   2. Type 2 diabetes mellitus with vascular disease ([place])   3. Morbid obesity ([place])   4. Malaise and fatigue       Tests ordered No orders of the defined types were placed in this encounter.    Plan: 1. As far as his symptoms of malaise and fatigue, I think he also has symptoms of thyroid deficiency with suboptimal free T3 levels.  After long discussion, we decided to try him on NP thyroid 60 mg daily. 2. As far as his low testosterone levels are concerned, I think he is a candidate for testosterone therapy.  I discussed FDA warnings, benefits and side effects and mode of administration.  He will continue to think about this further. 3. I also stressed to him the  importance of losing weight and he needs to eat healthier. 4. I will see him in about 4 weeks time to see how he is doing, check thyroid levels and have further discussion ongoing regarding testosterone therapy. 5. Today I spent 30 minutes with this patient discussing all of the above.   Meds ordered this encounter  Medications  . thyroid (NP THYROID) 60 MG tablet    Sig: Take 1 tablet (60 mg total) by mouth daily before breakfast.  Dispense:  30 tablet    Refill:  3    Terisha Losasso Luther Parody, MD

## 2020-02-10 ENCOUNTER — Other Ambulatory Visit (INDEPENDENT_AMBULATORY_CARE_PROVIDER_SITE_OTHER): Payer: Self-pay | Admitting: Internal Medicine

## 2020-02-10 ENCOUNTER — Encounter (INDEPENDENT_AMBULATORY_CARE_PROVIDER_SITE_OTHER): Payer: Self-pay | Admitting: Internal Medicine

## 2020-02-10 MED ORDER — EC-RX TESTOSTERONE 10 % TD CREA: 100.0000 mg | TOPICAL_CREAM | Freq: Two times a day (BID) | TRANSDERMAL | 2 refills | Status: DC

## 2020-03-15 ENCOUNTER — Ambulatory Visit (INDEPENDENT_AMBULATORY_CARE_PROVIDER_SITE_OTHER): Payer: PPO | Admitting: Internal Medicine

## 2020-03-29 ENCOUNTER — Encounter (INDEPENDENT_AMBULATORY_CARE_PROVIDER_SITE_OTHER): Payer: Self-pay | Admitting: Internal Medicine

## 2020-03-29 ENCOUNTER — Other Ambulatory Visit (INDEPENDENT_AMBULATORY_CARE_PROVIDER_SITE_OTHER): Payer: Self-pay | Admitting: Internal Medicine

## 2020-03-29 MED ORDER — TESTOSTERONE CYPIONATE 200 MG/ML IM SOLN
100.0000 mg | INTRAMUSCULAR | 0 refills | Status: DC
Start: 2020-03-29 — End: 2020-10-11

## 2020-03-30 ENCOUNTER — Other Ambulatory Visit: Payer: Self-pay

## 2020-03-30 ENCOUNTER — Ambulatory Visit (INDEPENDENT_AMBULATORY_CARE_PROVIDER_SITE_OTHER): Payer: PPO

## 2020-03-30 VITALS — Wt 253.3 lb

## 2020-03-30 DIAGNOSIS — R7989 Other specified abnormal findings of blood chemistry: Secondary | ICD-10-CM

## 2020-03-30 MED ORDER — TESTOSTERONE CYPIONATE 200 MG/ML IM SOLN
100.0000 mg | INTRAMUSCULAR | Status: DC
  Administered 2020-04-12 – 2020-05-25 (×7): 100 mg via INTRAMUSCULAR

## 2020-03-30 NOTE — Progress Notes (Signed)
Pt was given to give 0.18ml IM to the left  Thigh of Testosterone cypionate 200 MG/ML. Pt  & spouse was shown and trained on drawing medication up in syringe. The safety of disposal. Pt and spouse decided to come every Tuesday until she feels comfortable to she can start given at home.

## 2020-04-06 ENCOUNTER — Ambulatory Visit (INDEPENDENT_AMBULATORY_CARE_PROVIDER_SITE_OTHER): Payer: PPO

## 2020-04-06 NOTE — Telephone Encounter (Signed)
From patient.

## 2020-04-06 NOTE — Telephone Encounter (Signed)
From pt

## 2020-04-12 ENCOUNTER — Other Ambulatory Visit: Payer: Self-pay

## 2020-04-12 ENCOUNTER — Ambulatory Visit (INDEPENDENT_AMBULATORY_CARE_PROVIDER_SITE_OTHER): Payer: PPO | Admitting: Internal Medicine

## 2020-04-12 DIAGNOSIS — R7989 Other specified abnormal findings of blood chemistry: Secondary | ICD-10-CM | POA: Diagnosis not present

## 2020-04-12 NOTE — Progress Notes (Signed)
Pt was given 0.3mL to the left thigh . Pt tolerated well.

## 2020-04-13 ENCOUNTER — Ambulatory Visit (INDEPENDENT_AMBULATORY_CARE_PROVIDER_SITE_OTHER): Payer: PPO

## 2020-04-13 ENCOUNTER — Encounter (INDEPENDENT_AMBULATORY_CARE_PROVIDER_SITE_OTHER): Payer: Self-pay | Admitting: Internal Medicine

## 2020-04-13 NOTE — Progress Notes (Signed)
Patient was given intramuscular testosterone injection without any complications.

## 2020-04-14 ENCOUNTER — Ambulatory Visit (INDEPENDENT_AMBULATORY_CARE_PROVIDER_SITE_OTHER): Payer: PPO | Admitting: Internal Medicine

## 2020-04-20 ENCOUNTER — Other Ambulatory Visit: Payer: Self-pay

## 2020-04-20 ENCOUNTER — Ambulatory Visit (INDEPENDENT_AMBULATORY_CARE_PROVIDER_SITE_OTHER): Payer: PPO

## 2020-04-20 DIAGNOSIS — R7989 Other specified abnormal findings of blood chemistry: Secondary | ICD-10-CM | POA: Diagnosis not present

## 2020-04-20 DIAGNOSIS — E782 Mixed hyperlipidemia: Secondary | ICD-10-CM

## 2020-04-27 ENCOUNTER — Other Ambulatory Visit: Payer: Self-pay

## 2020-04-27 ENCOUNTER — Ambulatory Visit (INDEPENDENT_AMBULATORY_CARE_PROVIDER_SITE_OTHER): Payer: PPO

## 2020-04-27 DIAGNOSIS — R7989 Other specified abnormal findings of blood chemistry: Secondary | ICD-10-CM

## 2020-04-27 NOTE — Progress Notes (Signed)
Pt was given 0.5 mL in the left thigh. Pt tolerated well. No complaints. Applied a band-aid.

## 2020-05-03 ENCOUNTER — Ambulatory Visit (INDEPENDENT_AMBULATORY_CARE_PROVIDER_SITE_OTHER): Payer: PPO

## 2020-05-03 ENCOUNTER — Other Ambulatory Visit: Payer: Self-pay

## 2020-05-03 DIAGNOSIS — R7989 Other specified abnormal findings of blood chemistry: Secondary | ICD-10-CM | POA: Diagnosis not present

## 2020-05-03 NOTE — Progress Notes (Signed)
Pt was given 0.11mL to the right thigh. Pt tolerated well, no complaints. Applied band-aid to site.

## 2020-05-04 ENCOUNTER — Ambulatory Visit (INDEPENDENT_AMBULATORY_CARE_PROVIDER_SITE_OTHER): Payer: PPO

## 2020-05-11 ENCOUNTER — Ambulatory Visit (INDEPENDENT_AMBULATORY_CARE_PROVIDER_SITE_OTHER): Payer: PPO

## 2020-05-11 ENCOUNTER — Other Ambulatory Visit: Payer: Self-pay

## 2020-05-11 VITALS — HR 82 | Temp 97.5°F | Resp 18

## 2020-05-11 DIAGNOSIS — R7989 Other specified abnormal findings of blood chemistry: Secondary | ICD-10-CM

## 2020-05-11 NOTE — Progress Notes (Signed)
Pt was given 0.76ml to the left thigh. Pt tolerated well.No complaints. Apply Band-Aid.

## 2020-05-18 ENCOUNTER — Ambulatory Visit (INDEPENDENT_AMBULATORY_CARE_PROVIDER_SITE_OTHER): Payer: PPO | Admitting: Internal Medicine

## 2020-05-18 ENCOUNTER — Encounter (INDEPENDENT_AMBULATORY_CARE_PROVIDER_SITE_OTHER): Payer: Self-pay | Admitting: Internal Medicine

## 2020-05-18 ENCOUNTER — Other Ambulatory Visit: Payer: Self-pay

## 2020-05-18 ENCOUNTER — Ambulatory Visit (INDEPENDENT_AMBULATORY_CARE_PROVIDER_SITE_OTHER): Payer: PPO

## 2020-05-18 VITALS — BP 120/66 | HR 67 | Temp 97.7°F | Ht 68.0 in | Wt 260.0 lb

## 2020-05-18 VITALS — BP 120/66 | HR 63 | Temp 97.6°F | Resp 18 | Ht 68.0 in | Wt 260.0 lb

## 2020-05-18 DIAGNOSIS — I10 Essential (primary) hypertension: Secondary | ICD-10-CM | POA: Diagnosis not present

## 2020-05-18 DIAGNOSIS — E1159 Type 2 diabetes mellitus with other circulatory complications: Secondary | ICD-10-CM

## 2020-05-18 DIAGNOSIS — R7989 Other specified abnormal findings of blood chemistry: Secondary | ICD-10-CM

## 2020-05-18 NOTE — Progress Notes (Signed)
Pt was given 0.46ml to the rt thigh muscle. Pt tolerated well, no complaint. Applied band-aid.

## 2020-05-18 NOTE — Progress Notes (Signed)
Metrics: Intervention Frequency ACO  Documented Smoking Status Yearly  Screened one or more times in 24 months  Cessation Counseling or  Active cessation medication Past 24 months  Past 24 months   Guideline developer: UpToDate (See UpToDate for funding source) Date Released: 2014       Wellness Office Visit  Subjective:  Patient ID: Omar [name], male    DOB: 06-02-1946  Age: 74 y.o. MRN: [id number]  CC: This man comes in for follow-up of diabetes, hypertension, obesity and low testosterone and therapy for it. HPI He says he feels more energized since being on testosterone therapy and is not having any side effects.  He takes weekly injections. As far as his diabetes is concerned, he continues on Jardiance and Metformin.  His diet is very inconsistent.  He has not lost any significant weight. He continues on ACE inhibitor and hydrochlorthiazide for his hypertension as well as spironolactone. Denies any chest pain, dyspnea, palpitations or limb weakness.  Past Medical History:  Diagnosis Date  . Abnormal EKG    Inferior Q waves  . BPH (benign prostatic hyperplasia)   . Chronic anticoagulation 01/16/2017  . Coronary artery disease   . Degenerative joint disease   . Diabetes mellitus, type 2 (HCC)    No insulin  . DVT (deep venous thrombosis) ([place]) 4 -5 yrs ago  . DVT of leg (deep venous thrombosis) ([place]) 07/02/2015  . Heart murmur   . Hepatic steatosis   . Heterozygous factor V Leiden mutation ([place]) 01/16/2017  . Hyperlipidemia   . 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 Meningioma (HCC)    Left frontal; CVA identified by MRI; no neurologic symptoms  . Neuropathy   . Obesity   . Primary localized osteoarthritis of right hip 06/24/2019  . Sleep apnea 2008   Dr. Merlene Laughter interpreted the study-CPAP recommended, but refused by patient  . Stroke [facility])    no deficits from stroke, "Didnt know I had one when  they say I did".   Past Surgical History:  Procedure Laterality Date  . BIOPSY  04/16/2019   Procedure: BIOPSY;  Surgeon: Danie Binder, MD;  [facility];  Service: Endoscopy;;  . CATARACT EXTRACTION W/PHACO Left 04/03/2016   Procedure: CATARACT EXTRACTION PHACO AND INTRAOCULAR LENS PLACEMENT LEFT EYE CDE=21.76;  Surgeon: Williams Che, MD;  Location: [facility] ORS;  Service: Ophthalmology;  Laterality: Left;  . CATARACT EXTRACTION W/PHACO Right 06/19/2016   Procedure: CATARACT EXTRACTION PHACO AND INTRAOCULAR LENS PLACEMENT; CDE:  11.56;  Surgeon: Williams Che, MD;  Location: [facility] ORS;  Service: Ophthalmology;  Laterality: Right;  . COLONOSCOPY N/A 04/16/2019   Procedure: COLONOSCOPY;  Surgeon: Danie Binder, MD;  [facility];  Service: Endoscopy;  Laterality: N/A;  8:30am  . COLONOSCOPY W/ POLYPECTOMY  05/2008   polypectomy x4-adenomatous; internal hemorrhoids  . CORONARY ANGIOPLASTY WITH STENT PLACEMENT  06/04/2012     DES    to RI  . CORONARY STENT PLACEMENT     x 1  . DUPUYTREN / PALMAR FASCIOTOMY  2009   rt hand-dsc-went home same day x2  . ESOPHAGOGASTRODUODENOSCOPY N/A 04/16/2019   Procedure: ESOPHAGOGASTRODUODENOSCOPY (EGD);  Surgeon: Danie Binder, MD;  [facility];  Service: Endoscopy;  Laterality: N/A;  . LEFT HEART CATHETERIZATION WITH CORONARY ANGIOGRAM N/A 05/28/2012   Procedure: LEFT HEART CATHETERIZATION WITH CORONARY ANGIOGRAM;  Surgeon: Laverda Page, MD;  Location: [place] CATH LAB;  Service: Cardiovascular;  Laterality: N/A;  . PERCUTANEOUS CORONARY STENT INTERVENTION (PCI-S) N/A 06/04/2012   Procedure: PERCUTANEOUS CORONARY STENT INTERVENTION (PCI-S);  Surgeon: Laverda Page, MD;  Location: [facility] CATH LAB;  Service: Cardiovascular;  Laterality: N/A;  . POLYPECTOMY  04/16/2019   Procedure: POLYPECTOMY;  Surgeon: Danie Binder, MD;  [facility];  Service: Endoscopy;;  . TONSILLECTOMY    . TOTAL HIP ARTHROPLASTY Right 06/24/2019    Procedure: TOTAL HIP ARTHROPLASTY;  Surgeon: Marchia Bond, MD;  [facility];  Service: Orthopedics;  Laterality: Right;  . VENTRAL HERNIA REPAIR  2010   umb hernia-[facility]-went home same day     Family History  Problem Relation Age of Onset  . Cancer Mother   . Cancer Father   . Coronary artery disease Neg Hx     Social History   Social History Narrative   Married for 54 yrs,lives with wife.Works part-time with [employer].   Social History   Tobacco Use  . Smoking status: Never Smoker  . Smokeless tobacco: Never Used  Substance Use Topics  . Alcohol use: No    Current Meds  Medication Sig  . BYSTOLIC 20 MG TABS TAKE (1) TABLET BY MOUTH ONCE DAILY.  . Calcium-Vitamin D-Vitamin K 650-12.5-40 MG-MCG-MCG CHEW Chew 2 tablets by mouth at bedtime.  . empagliflozin (JARDIANCE) 25 MG TABS tablet Take 12.5 mg by mouth daily.  Marland Kitchen gabapentin (NEURONTIN) 100 MG capsule TAKE (1) CAPSULE BY MOUTH TWICE DAILY. (Patient taking differently: Take 100 mg by mouth 2 (two) times daily. )  . lisinopril-hydrochlorothiazide (PRINZIDE,ZESTORETIC) 20-25 MG per tablet Take 1 tablet by mouth daily.  . metFORMIN (GLUCOPHAGE) 1000 MG tablet TAKE (1) TABLET BY MOUTH TWICE DAILY. (Patient taking differently: Take 1,000 mg by mouth 2 (two) times a day. )  . Multiple Vitamins-Minerals (ONE-A-DAY 50 PLUS PO) Take 1 tablet by mouth at bedtime.   . nitroGLYCERIN (NITROSTAT) 0.4 MG SL tablet Place 1 tablet (0.4 mg total) under the tongue every 5 (five) minutes as needed for chest pain.  Marland Kitchen omeprazole (PRILOSEC) 20 MG capsule 1 PO 30 MINS PRIOR TO BREAKFAST. (Patient taking differently: Take 20 mg by mouth daily before breakfast. )  . pravastatin (PRAVACHOL) 40 MG tablet Take 40 mg by mouth at bedtime.   . sennosides-docusate sodium (SENOKOT-S) 8.6-50 MG tablet Take 2 tablets by mouth daily.  Marland Kitchen spironolactone (ALDACTONE) 50 MG tablet Take 1 tablet (50 mg total) by mouth every morning.  .  tamsulosin (FLOMAX) 0.4 MG CAPS capsule Take 0.4 mg by mouth daily.  Marland Kitchen testosterone cypionate (DEPO-TESTOSTERONE) 200 MG/ML injection Inject 0.5 mLs (100 mg total) into the muscle every 7 (seven) days.  Marland Kitchen thyroid (NP THYROID) 60 MG tablet Take 1 tablet (60 mg total) by mouth daily before breakfast.  . torsemide (DEMADEX) 20 MG tablet Take 20 mg by mouth daily as needed (swelling).   . traZODone (DESYREL) 100 MG tablet Take 100 mg by mouth at bedtime.  . verapamil (CALAN-SR) 240 MG CR tablet Take 1 tablet (240 mg total) by mouth at bedtime.  . vitamin B-12 (CYANOCOBALAMIN) 500 MCG tablet Take 500 mcg by mouth daily.  . Vitamin D, Ergocalciferol, (DRISDOL) 1.25 MG (50000 UT) CAPS capsule TAKE 1 CAPSULE BY MOUTH ONCE A WEEK.  . [DISCONTINUED] apixaban (ELIQUIS) 5 MG TABS tablet Take 1 tablet (5 mg total) by mouth 2 (two) times daily. For the first 7 days, take 2 tablets twice a day. Then  begin 1 tablet twice a day. (Patient taking differently: Take 5 mg by mouth 2 (two) times daily. )  . [DISCONTINUED] baclofen (LIORESAL) 10 MG tablet Take 1 tablet (10 mg total) by mouth 3 (three) times daily. As needed for muscle spasm   Current Facility-Administered Medications for the 05/18/20 encounter (Office Visit) with Doree Albee, MD  Medication  . testosterone cypionate (DEPOTESTOSTERONE CYPIONATE) injection 100 mg      Depression screen [facility] 2/9 12/24/2019 10/08/2019 08/08/2018 11/16/2017 08/09/2017  Decreased Interest 0 0 0 0 0  Down, Depressed, Hopeless 0 0 0 0 0  PHQ - 2 Score 0 0 0 0 0     Objective:   Today's Vitals: BP 120/66 (BP Location: Right Arm, Patient Position: Sitting, Cuff Size: Normal)   Pulse 67   Temp 97.7 F (36.5 C) (Temporal)   Ht 5\' 8"  (1.727 m)   Wt 260 lb (117.9 kg)   SpO2 98%   BMI 39.53 kg/m  Vitals with BMI 05/18/2020 05/11/2020 04/13/2020  Height 5\' 8"  - (No Data)  Weight 260 lbs - (No Data)  BMI 28.31 - -  Systolic 517 - (No Data)  Diastolic 66 - (No Data)   Pulse 67 82 -     Physical Exam  He looks systemically well, remains morbidly obese.  Blood pressure is excellent.  No other new physical findings today.     Assessment   1. Low testosterone in male   2. Type 2 diabetes mellitus with vascular disease ([place])   3. Essential hypertension   4. Morbid obesity ([place])       Tests ordered Orders Placed This Encounter  Procedures  . COMPLETE METABOLIC PANEL WITH GFR  . CBC  . T3, free  . Testosterone Total,Free,Bio, Males  . Hemoglobin A1c     Plan: 1. Blood work is ordered. 2. He will continue with testosterone therapy and he took an injection today at the office. 3. He will continue with Jardiance and Metformin for his diabetes and we will see what his A1c is.  I have emphasized the importance of watching his diet to improve his diabetes. 4. He will continue with ACE inhibitor and hydrochlorothiazide for his hypertension and this seems to be keeping it under good control. 5. He will continue to work on diet for his morbid obesity. 6. Further recommendations will depend on blood results and I will see him in  3 months for follow-up.   No orders of the defined types were placed in this encounter.   Doree Albee, MD

## 2020-05-19 LAB — HEMOGLOBIN A1C
Hgb A1c MFr Bld: 7.2 % of total Hgb — ABNORMAL HIGH (ref <5.7)
Mean Plasma Glucose: 160 (calc)
eAG (mmol/L): 8.9 (calc)

## 2020-05-19 LAB — TESTOSTERONE TOTAL,FREE,BIO, MALES
Albumin: 4.1 g/dL (ref 3.6–5.1)
Sex Hormone Binding: 24 nmol/L (ref 22–77)
Testosterone, Bioavailable: 245.3 ng/dL — ABNORMAL HIGH (ref 15.0–150.0)
Testosterone, Free: 130.3 pg/mL — ABNORMAL HIGH (ref 6.0–73.0)
Testosterone: 676 ng/dL (ref 250–827)

## 2020-05-19 LAB — CBC
HCT: 46.4 % (ref 38.5–50.0)
Hemoglobin: 15.5 g/dL (ref 13.2–17.1)
MCH: 29.7 pg (ref 27.0–33.0)
MCHC: 33.4 g/dL (ref 32.0–36.0)
MCV: 88.9 fL (ref 80.0–100.0)
MPV: 10.4 fL (ref 7.5–12.5)
Platelets: 286 10*3/uL (ref 140–400)
RBC: 5.22 10*6/uL (ref 4.20–5.80)
RDW: 14.8 % (ref 11.0–15.0)
WBC: 8.2 10*3/uL (ref 3.8–10.8)

## 2020-05-19 LAB — COMPLETE METABOLIC PANEL WITH GFR
AG Ratio: 1.9 (calc) (ref 1.0–2.5)
ALT: 10 U/L (ref 9–46)
AST: 12 U/L (ref 10–35)
Albumin: 4.1 g/dL (ref 3.6–5.1)
Alkaline phosphatase (APISO): 57 U/L (ref 35–144)
BUN: 16 mg/dL (ref 7–25)
CO2: 28 mmol/L (ref 20–32)
Calcium: 9.7 mg/dL (ref 8.6–10.3)
Chloride: 101 mmol/L (ref 98–110)
Creat: 1.13 mg/dL (ref 0.70–1.18)
GFR, Est African American: 73 mL/min/{1.73_m2} (ref >=60)
GFR, Est Non African American: 63 mL/min/{1.73_m2} (ref >=60)
Globulin: 2.2 g/dL (calc) (ref 1.9–3.7)
Glucose, Bld: 159 mg/dL — ABNORMAL HIGH (ref 65–99)
Potassium: 4.1 mmol/L (ref 3.5–5.3)
Sodium: 139 mmol/L (ref 135–146)
Total Bilirubin: 0.5 mg/dL (ref 0.2–1.2)
Total Protein: 6.3 g/dL (ref 6.1–8.1)

## 2020-05-19 LAB — T3, FREE: T3, Free: 3 pg/mL (ref 2.3–4.2)

## 2020-05-20 ENCOUNTER — Ambulatory Visit
Admission: EM | Admit: 2020-05-20 | Discharge: 2020-05-20 | Disposition: A | Discharge status: Home / Self Care | Payer: PPO | Attending: Emergency Medicine | Admitting: Emergency Medicine

## 2020-05-20 ENCOUNTER — Telehealth (INDEPENDENT_AMBULATORY_CARE_PROVIDER_SITE_OTHER): Payer: Self-pay | Admitting: Internal Medicine

## 2020-05-20 ENCOUNTER — Encounter: Payer: Self-pay | Admitting: Emergency Medicine

## 2020-05-20 ENCOUNTER — Other Ambulatory Visit: Payer: Self-pay

## 2020-05-20 DIAGNOSIS — Z1152 Encounter for screening for COVID-19: Secondary | ICD-10-CM

## 2020-05-20 DIAGNOSIS — J069 Acute upper respiratory infection, unspecified: Secondary | ICD-10-CM | POA: Diagnosis not present

## 2020-05-20 MED ORDER — CETIRIZINE HCL 10 MG PO TABS: 10.0000 mg | ORAL_TABLET | Freq: Every day | ORAL | 0 refills | Status: DC

## 2020-05-20 MED ORDER — FLUTICASONE PROPIONATE 50 MCG/ACT NA SUSP: 1.0000 | Freq: Every day | NASAL | 0 refills | Status: DC

## 2020-05-20 MED ORDER — BENZONATATE 100 MG PO CAPS: 100.0000 mg | ORAL_CAPSULE | Freq: Three times a day (TID) | ORAL | 0 refills | Status: DC

## 2020-05-20 MED ORDER — PREDNISONE 10 MG PO TABS: 20.0000 mg | ORAL_TABLET | Freq: Every day | ORAL | 0 refills | Status: DC

## 2020-05-20 NOTE — ED Provider Notes (Signed)
[place]   [id number] 05/20/20 Arrival Time: 1829   CC: COVID symptoms  SUBJECTIVE: History from: patient.  Omar [name] is a 74 y.o. male who presents to the urgent care for complaint of cough and nasal congestion that started the past 2 to 3 days.  Denies sick exposure to COVID, flu or strep.  Denies recent travel.  Has tried OTC medication without relief.  No aggravating factors.  Denies previous symptoms in the past.   Denies fever, chills, fatigue, sinus pain, rhinorrhea, sore throat, SOB, wheezing, chest pain, nausea, changes in bowel or bladder habits.     ROS: As per HPI.  All other pertinent ROS negative.     Past Medical History:  Diagnosis Date  . Abnormal EKG    Inferior Q waves  . BPH (benign prostatic hyperplasia)   . Chronic anticoagulation 01/16/2017  . Coronary artery disease   . Degenerative joint disease   . Diabetes mellitus, type 2 (HCC)    No insulin  . DVT (deep venous thrombosis) ([place]) 4 -5 yrs ago  . DVT of leg (deep venous thrombosis) ([place]) 07/02/2015  . Heart murmur   . Hepatic steatosis   . Heterozygous factor V Leiden mutation ([place]) 01/16/2017  . Hyperlipidemia   . 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 Meningioma (HCC)    Left frontal; CVA identified by MRI; no neurologic symptoms  . Neuropathy   . Obesity   . Primary localized osteoarthritis of right hip 06/24/2019  . Sleep apnea 2008   Dr. Merlene Laughter interpreted the study-CPAP recommended, but refused by patient  . Stroke [facility])    no deficits from stroke, "Didnt know I had one when they say I did".   Past Surgical History:  Procedure Laterality Date  . BIOPSY  04/16/2019   Procedure: BIOPSY;  Surgeon: Danie Binder, MD;  [facility];  Service: Endoscopy;;  . CATARACT EXTRACTION W/PHACO Left 04/03/2016   Procedure: CATARACT EXTRACTION PHACO AND INTRAOCULAR LENS PLACEMENT LEFT EYE CDE=21.76;   Surgeon: Williams Che, MD;  Location: [facility];  Service: Ophthalmology;  Laterality: Left;  . CATARACT EXTRACTION W/PHACO Right 06/19/2016   Procedure: CATARACT EXTRACTION PHACO AND INTRAOCULAR LENS PLACEMENT; CDE:  11.56;  Surgeon: Williams Che, MD;  [facility];  Service: Ophthalmology;  Laterality: Right;  . COLONOSCOPY N/A 04/16/2019   Procedure: COLONOSCOPY;  Surgeon: Danie Binder, MD;  [facility];  Service: Endoscopy;  Laterality: N/A;  8:30am  . COLONOSCOPY W/ POLYPECTOMY  05/2008   polypectomy x4-adenomatous; internal hemorrhoids  . CORONARY ANGIOPLASTY WITH STENT PLACEMENT  06/04/2012     DES    to RI  . CORONARY STENT PLACEMENT     x 1  . DUPUYTREN / PALMAR FASCIOTOMY  2009   rt hand-dsc-went home same day x2  . ESOPHAGOGASTRODUODENOSCOPY N/A 04/16/2019   Procedure: ESOPHAGOGASTRODUODENOSCOPY (EGD);  Surgeon: Danie Binder, MD;  [facility];  Service: Endoscopy;  Laterality: N/A;  . LEFT HEART CATHETERIZATION WITH CORONARY ANGIOGRAM N/A 05/28/2012   Procedure: LEFT HEART CATHETERIZATION WITH CORONARY ANGIOGRAM;  Surgeon: Laverda Page, MD;  Location: [facility] CATH LAB;  Service: Cardiovascular;  Laterality: N/A;  . PERCUTANEOUS CORONARY STENT INTERVENTION (PCI-S) N/A 06/04/2012   Procedure: PERCUTANEOUS CORONARY STENT INTERVENTION (PCI-S);  Surgeon: Laverda Page, MD;  Location: [facility] CATH LAB;  Service: Cardiovascular;  Laterality: N/A;  . POLYPECTOMY  04/16/2019   Procedure: POLYPECTOMY;  Surgeon: Danie Binder, MD;  [facility];  Service: Endoscopy;;  . TONSILLECTOMY    . TOTAL HIP ARTHROPLASTY Right 06/24/2019   Procedure: TOTAL HIP ARTHROPLASTY;  Surgeon: Marchia Bond, MD;  [facility];  Service: Orthopedics;  Laterality: Right;  . VENTRAL HERNIA REPAIR  2010   umb hernia-[facility]-went home same day   No Known Allergies Current Facility-Administered Medications on File Prior to Encounter  Medication Dose Route Frequency  Provider Last Rate Last Admin  . testosterone cypionate (DEPOTESTOSTERONE CYPIONATE) injection 100 mg  100 mg Intramuscular Q7 days Hurshel Party C, MD   100 mg at 05/18/20 1025   Current Outpatient Medications on File Prior to Encounter  Medication Sig Dispense Refill  . BYSTOLIC 20 MG TABS TAKE (1) TABLET BY MOUTH ONCE DAILY. 180 tablet 1  . Calcium-Vitamin D-Vitamin K 650-12.5-40 MG-MCG-MCG CHEW Chew 2 tablets by mouth at bedtime.    . empagliflozin (JARDIANCE) 25 MG TABS tablet Take 12.5 mg by mouth daily.    Marland Kitchen gabapentin (NEURONTIN) 100 MG capsule TAKE (1) CAPSULE BY MOUTH TWICE DAILY. (Patient taking differently: Take 100 mg by mouth 2 (two) times daily. ) 60 capsule 3  . lisinopril-hydrochlorothiazide (PRINZIDE,ZESTORETIC) 20-25 MG per tablet Take 1 tablet by mouth daily.    . metFORMIN (GLUCOPHAGE) 1000 MG tablet TAKE (1) TABLET BY MOUTH TWICE DAILY. (Patient taking differently: Take 1,000 mg by mouth 2 (two) times a day. ) 60 tablet 3  . Multiple Vitamins-Minerals (ONE-A-DAY 50 PLUS PO) Take 1 tablet by mouth at bedtime.     . nitroGLYCERIN (NITROSTAT) 0.4 MG SL tablet Place 1 tablet (0.4 mg total) under the tongue every 5 (five) minutes as needed for chest pain. 25 tablet 1  . omeprazole (PRILOSEC) 20 MG capsule 1 PO 30 MINS PRIOR TO BREAKFAST. (Patient taking differently: Take 20 mg by mouth daily before breakfast. ) 90 capsule 3  . pravastatin (PRAVACHOL) 40 MG tablet Take 40 mg by mouth at bedtime.     . sennosides-docusate sodium (SENOKOT-S) 8.6-50 MG tablet Take 2 tablets by mouth daily. 30 tablet 1  . spironolactone (ALDACTONE) 50 MG tablet Take 1 tablet (50 mg total) by mouth every morning. 90 tablet 1  . tamsulosin (FLOMAX) 0.4 MG CAPS capsule Take 0.4 mg by mouth daily.    Marland Kitchen testosterone cypionate (DEPO-TESTOSTERONE) 200 MG/ML injection Inject 0.5 mLs (100 mg total) into the muscle every 7 (seven) days. 10 mL 0  . thyroid (NP THYROID) 60 MG tablet Take 1 tablet (60 mg total)  by mouth daily before breakfast. 30 tablet 3  . torsemide (DEMADEX) 20 MG tablet Take 20 mg by mouth daily as needed (swelling).     . traZODone (DESYREL) 100 MG tablet Take 100 mg by mouth at bedtime.    . verapamil (CALAN-SR) 240 MG CR tablet Take 1 tablet (240 mg total) by mouth at bedtime. 90 tablet 0  . vitamin B-12 (CYANOCOBALAMIN) 500 MCG tablet Take 500 mcg by mouth daily.    . Vitamin D, Ergocalciferol, (DRISDOL) 1.25 MG (50000 UT) CAPS capsule TAKE 1 CAPSULE BY MOUTH ONCE A WEEK. 12 capsule 0   Social History   Socioeconomic History  . Marital status: Married    Spouse name: Not on file  . Number of children: 2  . Years of education: Not on file  . Highest education level: Not on file  Occupational History  . Occupation: Games developer    Comment:  [employer]  Tobacco Use  . Smoking status: Never Smoker  . Smokeless tobacco: Never Used  Vaping Use  . Vaping Use: Never used  Substance and Sexual Activity  . Alcohol use: No  . Drug use: No  . Sexual activity: Not Currently    Birth control/protection: None  Other Topics Concern  . Not on file  Social History Narrative   Married for 64 yrs,lives with wife.Works part-time with [employer].Ex-[employer].   Social Determinants of Health   Financial Resource Strain:   . Difficulty of Paying Living Expenses: Not on file  Food Insecurity:   . Worried About Charity fundraiser in the Last Year: Not on file  . Ran Out of Food in the Last Year: Not on file  Transportation Needs:   . Lack of Transportation (Medical): Not on file  . Lack of Transportation (Non-Medical): Not on file  Physical Activity:   . Days of Exercise per Week: Not on file  . Minutes of Exercise per Session: Not on file  Stress:   . Feeling of Stress : Not on file  Social Connections:   . Frequency of Communication with Friends and Family: Not on file  . Frequency of Social Gatherings with Friends and Family: Not on file  . Attends  Religious Services: Not on file  . Active Member of Clubs or Organizations: Not on file  . Attends Archivist Meetings: Not on file  . Marital Status: Not on file  Intimate Partner Violence:   . Fear of Current or Ex-Partner: Not on file  . Emotionally Abused: Not on file  . Physically Abused: Not on file  . Sexually Abused: Not on file   Family History  Problem Relation Age of Onset  . Cancer Mother   . Cancer Father   . Coronary artery disease Neg Hx     OBJECTIVE:  Vitals:   05/20/20 1254 05/20/20 1255  BP:  128/82  Pulse:  63  Resp:  18  Temp:  98.7 F (37.1 C)  TempSrc:  Oral  SpO2:  93%  Weight: 262 lb 5.6 oz (119 kg)   Height: 5\' 8"  (1.727 m)      General appearance: alert; appears fatigued, but nontoxic; speaking in full sentences and tolerating own secretions HEENT: NCAT; Ears: EACs clear, TMs pearly gray; Eyes: PERRL.  EOM grossly intact. Sinuses: nontender; Nose: nares patent without rhinorrhea, Throat: oropharynx clear, tonsils non erythematous or enlarged, uvula midline  Neck: supple without LAD Lungs: unlabored respirations, symmetrical air entry; cough: mild; no respiratory distress; CTAB Heart: regular rate and rhythm.  Radial pulses 2+ symmetrical bilaterally Skin: warm and dry Psychological: alert and cooperative; normal mood and affect  LABS:  No results found for this or any previous visit (from the past 24 hour(s)).   ASSESSMENT & PLAN:  1. URI with cough and congestion   2. Encounter for screening for COVID-19     Meds ordered this encounter  Medications  . cetirizine (ZYRTEC ALLERGY) 10 MG tablet    Sig: Take 1 tablet (10 mg total) by mouth daily.    Dispense:  30 tablet    Refill:  0  . fluticasone (FLONASE) 50 MCG/ACT nasal spray    Sig: Place 1 spray into both nostrils daily for 14 days.    Dispense:  16 g    Refill:  0  . benzonatate (TESSALON) 100 MG capsule    Sig: Take 1 capsule (100 mg total) by mouth  every 8  (eight) hours.    Dispense:  30 capsule    Refill:  0  . predniSONE (DELTASONE) 10 MG tablet    Sig: Take 2 tablets (20 mg total) by mouth daily.    Dispense:  15 tablet    Refill:  0    Discharge instructions  COVID testing ordered.  It will take between 5-7 days for test results.  Someone will contact you regarding abnormal results.    In the meantime: You should remain isolated in your home for 10 days from symptom onset AND greater than 24 hours after symptoms resolution (absence of fever without the use of fever-reducing medication and improvement in respiratory symptoms), whichever is longer Get plenty of rest and push fluids Tessalon Perles prescribed for cough Zyrtec for nasal congestion, runny nose, and/or sore throat Fonase for nasal congestion and runny nose  low-dose prednisone was prescribed Use medications daily for symptom relief Use OTC medications like ibuprofen or tylenol as needed fever or pain Call or go to the ED if you have any new or worsening symptoms such as fever, worsening cough, shortness of breath, chest tightness, chest pain, turning blue, changes in mental status, etc...   Reviewed expectations re: course of current medical issues. Questions answered. Outlined signs and symptoms indicating need for more acute intervention. Patient verbalized understanding. After Visit Summary given.      Note: This document was prepared using Dragon voice recognition software and may include unintentional dictation errors.    Emerson Monte, FNP 05/20/20 1310

## 2020-05-20 NOTE — ED Triage Notes (Signed)
Cough and nasal congestion since Tuesday

## 2020-05-20 NOTE — Telephone Encounter (Signed)
Sent patient to urgent care

## 2020-05-20 NOTE — Discharge Instructions (Addendum)
COVID testing ordered.  It will take between 5-7 days for test results.  Someone will contact you regarding abnormal results.    In the meantime: You should remain isolated in your home for 10 days from symptom onset AND greater than 24 hours after symptoms resolution (absence of fever without the use of fever-reducing medication and improvement in respiratory symptoms), whichever is longer Get plenty of rest and push fluids Tessalon Perles prescribed for cough Zyrtec for nasal congestion, runny nose, and/or sore throat Fonase for nasal congestion and runny nose  low-dose prednisone was prescribed Use medications daily for symptom relief Use OTC medications like ibuprofen or tylenol as needed fever or pain Call or go to the ED if you have any new or worsening symptoms such as fever, worsening cough, shortness of breath, chest tightness, chest pain, turning blue, changes in mental status, etc..Marland Kitchen

## 2020-05-21 LAB — SARS-COV-2, NAA 2 DAY TAT

## 2020-05-21 LAB — NOVEL CORONAVIRUS, NAA: SARS-CoV-2, NAA: NOT DETECTED

## 2020-05-25 ENCOUNTER — Other Ambulatory Visit: Payer: Self-pay

## 2020-05-25 ENCOUNTER — Ambulatory Visit (INDEPENDENT_AMBULATORY_CARE_PROVIDER_SITE_OTHER): Payer: PPO

## 2020-05-25 VITALS — Temp 97.5°F | Ht 68.0 in | Wt 266.0 lb

## 2020-05-25 DIAGNOSIS — R7989 Other specified abnormal findings of blood chemistry: Secondary | ICD-10-CM | POA: Diagnosis not present

## 2020-05-25 NOTE — Progress Notes (Signed)
Pt was given 0.67mL To the right thigh. Pt tolerated well;no complaints.

## 2020-06-01 ENCOUNTER — Other Ambulatory Visit: Payer: Self-pay

## 2020-06-01 ENCOUNTER — Ambulatory Visit (INDEPENDENT_AMBULATORY_CARE_PROVIDER_SITE_OTHER): Payer: PPO

## 2020-06-01 VITALS — Temp 97.8°F | Ht 68.0 in | Wt 266.0 lb

## 2020-06-01 DIAGNOSIS — R7989 Other specified abnormal findings of blood chemistry: Secondary | ICD-10-CM

## 2020-06-01 NOTE — Progress Notes (Signed)
Pt given IM 0.82mL to Rt  Thigh. Pt tolerated well no complaints.

## 2020-06-08 ENCOUNTER — Ambulatory Visit (INDEPENDENT_AMBULATORY_CARE_PROVIDER_SITE_OTHER): Payer: PPO

## 2020-06-08 ENCOUNTER — Other Ambulatory Visit: Payer: Self-pay

## 2020-06-08 DIAGNOSIS — R7989 Other specified abnormal findings of blood chemistry: Secondary | ICD-10-CM | POA: Diagnosis not present

## 2020-06-08 MED ORDER — TESTOSTERONE CYPIONATE 200 MG/ML IM SOLN
200.0000 mg | INTRAMUSCULAR | Status: DC
  Administered 2020-06-08 – 2020-08-04 (×7): 200 mg via INTRAMUSCULAR

## 2020-06-09 ENCOUNTER — Telehealth: Payer: Self-pay

## 2020-06-09 DIAGNOSIS — K219 Gastro-esophageal reflux disease without esophagitis: Secondary | ICD-10-CM

## 2020-06-09 NOTE — Telephone Encounter (Signed)
Refill request received from [facility] for Omeprazole 20 mg, one capsule 30 mins before breakfast.

## 2020-06-14 MED ORDER — OMEPRAZOLE 20 MG PO CPDR: 20.0000 mg | DELAYED_RELEASE_CAPSULE | Freq: Every day | ORAL | 1 refills | Status: DC

## 2020-06-14 NOTE — Addendum Note (Signed)
Addended by: Gordy Levan, Chrisanna Mishra A on: 06/14/2020 07:58 PM   Modules accepted: Orders

## 2020-06-15 ENCOUNTER — Ambulatory Visit (INDEPENDENT_AMBULATORY_CARE_PROVIDER_SITE_OTHER): Payer: PPO

## 2020-06-22 ENCOUNTER — Other Ambulatory Visit: Payer: Self-pay

## 2020-06-22 ENCOUNTER — Ambulatory Visit (INDEPENDENT_AMBULATORY_CARE_PROVIDER_SITE_OTHER): Payer: PPO

## 2020-06-22 DIAGNOSIS — R7989 Other specified abnormal findings of blood chemistry: Secondary | ICD-10-CM

## 2020-06-22 NOTE — Progress Notes (Signed)
Pt given 0.38ml to the rt thigh. Pt tolerated well;no complaint.

## 2020-06-29 ENCOUNTER — Other Ambulatory Visit: Payer: Self-pay

## 2020-06-29 ENCOUNTER — Ambulatory Visit (INDEPENDENT_AMBULATORY_CARE_PROVIDER_SITE_OTHER): Payer: PPO

## 2020-06-29 DIAGNOSIS — R7989 Other specified abnormal findings of blood chemistry: Secondary | ICD-10-CM | POA: Diagnosis not present

## 2020-06-29 NOTE — Progress Notes (Signed)
Pt given Testosterone cypionate 0.48ml IM to left thigh. Pt tolerated well no complaints.applied band-aid. Pt also given High Dose influenza 28ml,IM Rt arm. QSY:[id number] /exp:02/22/2021. Applied bandaid.

## 2020-07-06 ENCOUNTER — Other Ambulatory Visit: Payer: Self-pay

## 2020-07-06 ENCOUNTER — Ambulatory Visit (INDEPENDENT_AMBULATORY_CARE_PROVIDER_SITE_OTHER): Payer: PPO

## 2020-07-06 DIAGNOSIS — R7989 Other specified abnormal findings of blood chemistry: Secondary | ICD-10-CM

## 2020-07-06 NOTE — Progress Notes (Signed)
Pt given 0.69ml IM Testosterone  to the right thigh. Pt tolerated well. No complaints.Bandaid applied.

## 2020-07-09 ENCOUNTER — Other Ambulatory Visit: Payer: Self-pay | Admitting: Cardiology

## 2020-07-13 ENCOUNTER — Other Ambulatory Visit: Payer: Self-pay

## 2020-07-13 ENCOUNTER — Ambulatory Visit (INDEPENDENT_AMBULATORY_CARE_PROVIDER_SITE_OTHER): Payer: PPO

## 2020-07-13 DIAGNOSIS — R7989 Other specified abnormal findings of blood chemistry: Secondary | ICD-10-CM

## 2020-07-13 NOTE — Progress Notes (Signed)
Pt given Testosterone  05 ML to the right thigh. Pt tolerated well;no complaints.

## 2020-07-20 ENCOUNTER — Ambulatory Visit (INDEPENDENT_AMBULATORY_CARE_PROVIDER_SITE_OTHER): Payer: PPO

## 2020-07-20 ENCOUNTER — Other Ambulatory Visit: Payer: Self-pay

## 2020-07-20 DIAGNOSIS — R7989 Other specified abnormal findings of blood chemistry: Secondary | ICD-10-CM

## 2020-07-20 NOTE — Progress Notes (Signed)
Patient came in today and received 0.5 ml of Testosterone in Left anterior thigh.  Patient tolerated the injection well.

## 2020-07-22 ENCOUNTER — Ambulatory Visit (INDEPENDENT_AMBULATORY_CARE_PROVIDER_SITE_OTHER): Payer: PPO | Admitting: Internal Medicine

## 2020-07-22 ENCOUNTER — Other Ambulatory Visit: Payer: Self-pay

## 2020-07-22 ENCOUNTER — Encounter (INDEPENDENT_AMBULATORY_CARE_PROVIDER_SITE_OTHER): Payer: Self-pay | Admitting: Internal Medicine

## 2020-07-22 VITALS — BP 128/82 | HR 61 | Temp 97.9°F | Resp 18 | Ht 68.0 in | Wt 258.8 lb

## 2020-07-22 DIAGNOSIS — I10 Essential (primary) hypertension: Secondary | ICD-10-CM | POA: Diagnosis not present

## 2020-07-22 DIAGNOSIS — E1159 Type 2 diabetes mellitus with other circulatory complications: Secondary | ICD-10-CM | POA: Diagnosis not present

## 2020-07-22 DIAGNOSIS — R7989 Other specified abnormal findings of blood chemistry: Secondary | ICD-10-CM

## 2020-07-22 MED ORDER — TESTOSTERONE 20 % CREA: 100.0000 mg | TOPICAL_CREAM | Freq: Every day | 0 refills | Status: DC

## 2020-07-22 NOTE — Progress Notes (Signed)
Metrics: Intervention Frequency ACO  Documented Smoking Status Yearly  Screened one or more times in 24 months  Cessation Counseling or  Active cessation medication Past 24 months  Past 24 months   Guideline developer: UpToDate (See UpToDate for funding source) Date Released: 2014       Wellness Office Visit  Subjective:  Patient ID: Omar [name], male    DOB: March 17, 1946  Age: 74 y.o. MRN: [id number]  CC: This man comes in for follow-up of morbid obesity, diabetes, hypertension and hypogonadism. HPI  He has been on testosterone therapy for several months now and his testosterone level improved from a total testosterone level of 69 to over 600 and he does feel improved with weekly injections.  However, he would like to try the testosterone cream, which would save him coming to the office for the injections once a week. He has not really changed his diet regarding improving his obesity and diabetes. Past Medical History:  Diagnosis Date  . Abnormal EKG    Inferior Q waves  . BPH (benign prostatic hyperplasia)   . Chronic anticoagulation 01/16/2017  . Coronary artery disease   . Degenerative joint disease   . Diabetes mellitus, type 2 (HCC)    No insulin  . DVT (deep venous thrombosis) ([place]) 4 -5 yrs ago  . DVT of leg (deep venous thrombosis) ([place]) 07/02/2015  . Heart murmur   . Hepatic steatosis   . Heterozygous factor V Leiden mutation ([place]) 01/16/2017  . Hyperlipidemia   . 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 Meningioma (HCC)    Left frontal; CVA identified by MRI; no neurologic symptoms  . Neuropathy   . Obesity   . Primary localized osteoarthritis of right hip 06/24/2019  . Sleep apnea 2008   Dr. Merlene Laughter interpreted the study-CPAP recommended, but refused by patient  . Stroke [facility])    no deficits from stroke, "Didnt know I had one when they say I did".   Past Surgical History:  Procedure  Laterality Date  . BIOPSY  04/16/2019   Procedure: BIOPSY;  Surgeon: Danie Binder, MD;  [facility];  Service: Endoscopy;;  . CATARACT EXTRACTION W/PHACO Left 04/03/2016   Procedure: CATARACT EXTRACTION PHACO AND INTRAOCULAR LENS PLACEMENT LEFT EYE CDE=21.76;  Surgeon: Williams Che, MD;  Location: [facility] ORS;  Service: Ophthalmology;  Laterality: Left;  . CATARACT EXTRACTION W/PHACO Right 06/19/2016   Procedure: CATARACT EXTRACTION PHACO AND INTRAOCULAR LENS PLACEMENT; CDE:  11.56;  Surgeon: Williams Che, MD;  Location: [facility];  Service: Ophthalmology;  Laterality: Right;  . COLONOSCOPY N/A 04/16/2019   Procedure: COLONOSCOPY;  Surgeon: Danie Binder, MD;  [facility];  Service: Endoscopy;  Laterality: N/A;  8:30am  . COLONOSCOPY W/ POLYPECTOMY  05/2008   polypectomy x4-adenomatous; internal hemorrhoids  . CORONARY ANGIOPLASTY WITH STENT PLACEMENT  06/04/2012     DES    to RI  . CORONARY STENT PLACEMENT     x 1  . DUPUYTREN / PALMAR FASCIOTOMY  2009   rt hand-dsc-went home same day x2  . ESOPHAGOGASTRODUODENOSCOPY N/A 04/16/2019   Procedure: ESOPHAGOGASTRODUODENOSCOPY (EGD);  Surgeon: Danie Binder, MD;  [facility];  Service: Endoscopy;  Laterality: N/A;  . LEFT HEART CATHETERIZATION WITH CORONARY ANGIOGRAM N/A 05/28/2012   Procedure: LEFT HEART CATHETERIZATION WITH CORONARY ANGIOGRAM;  Surgeon: Laverda Page, MD;  Location: [facility] CATH LAB;  Service:  Cardiovascular;  Laterality: N/A;  . PERCUTANEOUS CORONARY STENT INTERVENTION (PCI-S) N/A 06/04/2012   Procedure: PERCUTANEOUS CORONARY STENT INTERVENTION (PCI-S);  Surgeon: Laverda Page, MD;  Location: [facility] CATH LAB;  Service: Cardiovascular;  Laterality: N/A;  . POLYPECTOMY  04/16/2019   Procedure: POLYPECTOMY;  Surgeon: Danie Binder, MD;  [facility];  Service: Endoscopy;;  . TONSILLECTOMY    . TOTAL HIP ARTHROPLASTY Right 06/24/2019   Procedure: TOTAL HIP ARTHROPLASTY;  Surgeon: Marchia Bond, MD;  Location: [facility];  Service: Orthopedics;  Laterality: Right;  . VENTRAL HERNIA REPAIR  2010   umb hernia-[facility]-went home same day     Family History  Problem Relation Age of Onset  . Cancer Mother   . Cancer Father   . Coronary artery disease Neg Hx     Social History   Social History Narrative   Married for 63 yrs,lives with wife.Works part-time with [employer].   Social History   Tobacco Use  . Smoking status: Never Smoker  . Smokeless tobacco: Never Used  Substance Use Topics  . Alcohol use: No    Current Meds  Medication Sig  . benzonatate (TESSALON) 100 MG capsule Take 1 capsule (100 mg total) by mouth every 8 (eight) hours.  Marland Kitchen BYSTOLIC 20 MG TABS TAKE (1) TABLET BY MOUTH ONCE DAILY.  . Calcium-Vitamin D-Vitamin K 650-12.5-40 MG-MCG-MCG CHEW Chew 2 tablets by mouth at bedtime.  . cetirizine (ZYRTEC ALLERGY) 10 MG tablet Take 1 tablet (10 mg total) by mouth daily.  . empagliflozin (JARDIANCE) 25 MG TABS tablet Take 12.5 mg by mouth daily.  Marland Kitchen gabapentin (NEURONTIN) 100 MG capsule TAKE (1) CAPSULE BY MOUTH TWICE DAILY. (Patient taking differently: Take 100 mg by mouth 2 (two) times daily. )  . lisinopril-hydrochlorothiazide (PRINZIDE,ZESTORETIC) 20-25 MG per tablet Take 1 tablet by mouth daily.  . metFORMIN (GLUCOPHAGE) 1000 MG tablet TAKE (1) TABLET BY MOUTH TWICE DAILY. (Patient taking differently: Take 1,000 mg by mouth 2 (two) times a day. )  . Multiple Vitamins-Minerals (ONE-A-DAY 50 PLUS PO) Take 1 tablet by mouth at bedtime.   . nitroGLYCERIN (NITROSTAT) 0.4 MG SL tablet Place 1 tablet (0.4 mg total) under the tongue every 5 (five) minutes as needed for chest pain.  Marland Kitchen omeprazole (PRILOSEC) 20 MG capsule Take 1 capsule (20 mg total) by mouth daily before breakfast.  . pravastatin (PRAVACHOL) 40 MG tablet Take 40 mg by mouth at bedtime.   . predniSONE (DELTASONE) 10 MG tablet Take 2 tablets (20 mg total) by mouth daily.  .  sennosides-docusate sodium (SENOKOT-S) 8.6-50 MG tablet Take 2 tablets by mouth daily.  Marland Kitchen spironolactone (ALDACTONE) 50 MG tablet Take 1 tablet (50 mg total) by mouth every morning.  . tamsulosin (FLOMAX) 0.4 MG CAPS capsule Take 0.4 mg by mouth daily.  Marland Kitchen testosterone cypionate (DEPO-TESTOSTERONE) 200 MG/ML injection Inject 0.5 mLs (100 mg total) into the muscle every 7 (seven) days.  Marland Kitchen thyroid (NP THYROID) 60 MG tablet Take 1 tablet (60 mg total) by mouth daily before breakfast.  . torsemide (DEMADEX) 20 MG tablet Take 20 mg by mouth daily as needed (swelling).   . traZODone (DESYREL) 100 MG tablet Take 100 mg by mouth at bedtime.  . verapamil (CALAN-SR) 240 MG CR tablet Take 1 tablet (240 mg total) by mouth at bedtime.  . vitamin B-12 (CYANOCOBALAMIN) 500 MCG tablet Take 500 mcg by mouth daily.  . Vitamin D, Ergocalciferol, (DRISDOL) 1.25 MG (50000 UT) CAPS capsule TAKE 1 CAPSULE  BY MOUTH ONCE A WEEK.   Current Facility-Administered Medications for the 07/22/20 encounter (Office Visit) with Doree Albee, MD  Medication  . testosterone cypionate (DEPOTESTOSTERONE CYPIONATE) injection 200 mg      Depression screen [facility] 2/9 12/24/2019 10/08/2019 08/08/2018 11/16/2017 08/09/2017  Decreased Interest 0 0 0 0 0  Down, Depressed, Hopeless 0 0 0 0 0  PHQ - 2 Score 0 0 0 0 0  Some recent data might be hidden     Objective:   Today's Vitals: BP 128/82 (BP Location: Right Arm, Patient Position: Sitting, Cuff Size: Normal)   Pulse 61   Temp 97.9 F (36.6 C) (Temporal)   Resp 18   Ht 5\' 8"  (1.727 m)   Wt 258 lb 12.8 oz (117.4 kg)   SpO2 98%   BMI 39.35 kg/m  Vitals with BMI 07/22/2020 06/01/2020 05/25/2020  Height 5\' 8"  5\' 8"  5\' 8"   Weight 258 lbs 13 oz 266 lbs 266 lbs  BMI 39.36 93.71 69.67  Systolic 893 - -  Diastolic 82 - -  Pulse 61 - -     Physical Exam  He remains morbidly obese although he appears to have lost 8 pounds since the last visit.  Blood pressure is in a good  range.     Assessment   1. Type 2 diabetes mellitus with vascular disease ([place])   2. Essential hypertension   3. Morbid obesity ([place])   4. Low testosterone in male       Tests ordered No orders of the defined types were placed in this encounter.    Plan: 1. He will continue with Jardiance and Metformin for his diabetes. 2. He will continue with antihypertensive therapy listed above for his hypertension.  This is keeping his blood pressure under good control. 3. We discussed nutrition and the concept of intermittent fasting combined with a plant-based diet maintaining hydration. 4. Also for his symptoms of thyroid hypofunction, he will start now the NP thyroid 60 mg daily. 5. As far as his hypogonadism is concerned, we will switch him to testosterone cream and I have called in a prescription to [place] for compounded 20% testosterone cream, 100 mg applied to the scrotal skin daily. 6. Follow-up in a couple of months time to see how he is doing and we will check all the blood work then.  I spent 45 minutes with this patient today discussing all of the above.   Meds ordered this encounter  Medications  . Testosterone 20 % CREA    Sig: Apply 100 mg topically daily.    Dispense:  100 g    Refill:  0    Bethann Qualley Luther Parody, MD

## 2020-07-27 ENCOUNTER — Ambulatory Visit (INDEPENDENT_AMBULATORY_CARE_PROVIDER_SITE_OTHER): Payer: PPO

## 2020-07-28 ENCOUNTER — Ambulatory Visit (INDEPENDENT_AMBULATORY_CARE_PROVIDER_SITE_OTHER): Payer: PPO

## 2020-07-28 ENCOUNTER — Other Ambulatory Visit: Payer: Self-pay

## 2020-07-28 DIAGNOSIS — R7989 Other specified abnormal findings of blood chemistry: Secondary | ICD-10-CM | POA: Diagnosis not present

## 2020-07-28 NOTE — Progress Notes (Signed)
Pt came today after road trip from [place] ,[place]. Pt given 0.53mL IM of testosterone 200 mg to rt leg. Pt is tolerated well; no complaints. Will come back to finish this last bottle before he starts topical cream regimen.

## 2020-07-29 ENCOUNTER — Ambulatory Visit (INDEPENDENT_AMBULATORY_CARE_PROVIDER_SITE_OTHER): Payer: PPO

## 2020-08-03 ENCOUNTER — Ambulatory Visit (INDEPENDENT_AMBULATORY_CARE_PROVIDER_SITE_OTHER): Payer: PPO

## 2020-08-04 ENCOUNTER — Other Ambulatory Visit: Payer: Self-pay

## 2020-08-04 ENCOUNTER — Ambulatory Visit (INDEPENDENT_AMBULATORY_CARE_PROVIDER_SITE_OTHER): Payer: PPO

## 2020-08-04 DIAGNOSIS — R7989 Other specified abnormal findings of blood chemistry: Secondary | ICD-10-CM

## 2020-08-04 NOTE — Progress Notes (Signed)
Pt was given last testosterone 200 mg / 0.32ml IM TODAY. PT IS CHANGING COURSE TO THE CREAM NOW.PT GIVEN TO LEFT THIGH TOLERATED WELL.

## 2020-08-10 ENCOUNTER — Ambulatory Visit (INDEPENDENT_AMBULATORY_CARE_PROVIDER_SITE_OTHER): Payer: PPO

## 2020-08-12 ENCOUNTER — Ambulatory Visit: Payer: PPO | Attending: Internal Medicine

## 2020-08-12 DIAGNOSIS — Z23 Encounter for immunization: Secondary | ICD-10-CM

## 2020-08-12 NOTE — Progress Notes (Signed)
   Covid-19 Vaccination Clinic  Name:  Omar [name]    MRN: [id number] DOB: 1946/09/25  08/12/2020  [name] was observed post Covid-19 immunization for 15 minutes without incident. He was provided with Vaccine Information Sheet and instruction to access the V-Safe system.   [name] was instructed to call [phone] with any severe reactions post vaccine: Marland Kitchen Difficulty breathing  . Swelling of face and throat  . A fast heartbeat  . A bad rash all over body  . Dizziness and weakness

## 2020-08-17 ENCOUNTER — Ambulatory Visit (INDEPENDENT_AMBULATORY_CARE_PROVIDER_SITE_OTHER): Payer: PPO

## 2020-08-17 ENCOUNTER — Ambulatory Visit (INDEPENDENT_AMBULATORY_CARE_PROVIDER_SITE_OTHER): Payer: PPO | Admitting: Internal Medicine

## 2020-08-24 ENCOUNTER — Ambulatory Visit (INDEPENDENT_AMBULATORY_CARE_PROVIDER_SITE_OTHER): Payer: PPO

## 2020-08-25 ENCOUNTER — Ambulatory Visit: Payer: PPO | Admitting: Cardiology

## 2020-08-31 ENCOUNTER — Ambulatory Visit (INDEPENDENT_AMBULATORY_CARE_PROVIDER_SITE_OTHER): Payer: PPO

## 2020-09-03 DIAGNOSIS — E119 Type 2 diabetes mellitus without complications: Secondary | ICD-10-CM | POA: Diagnosis not present

## 2020-09-07 ENCOUNTER — Telehealth (INDEPENDENT_AMBULATORY_CARE_PROVIDER_SITE_OTHER): Payer: PPO | Admitting: Nurse Practitioner

## 2020-09-07 ENCOUNTER — Other Ambulatory Visit: Payer: PPO

## 2020-09-07 ENCOUNTER — Other Ambulatory Visit: Payer: Self-pay

## 2020-09-07 ENCOUNTER — Ambulatory Visit (INDEPENDENT_AMBULATORY_CARE_PROVIDER_SITE_OTHER): Payer: PPO

## 2020-09-07 ENCOUNTER — Encounter (INDEPENDENT_AMBULATORY_CARE_PROVIDER_SITE_OTHER): Payer: Self-pay | Admitting: Nurse Practitioner

## 2020-09-07 DIAGNOSIS — Z20822 Contact with and (suspected) exposure to covid-19: Secondary | ICD-10-CM | POA: Diagnosis not present

## 2020-09-07 DIAGNOSIS — R059 Cough, unspecified: Secondary | ICD-10-CM

## 2020-09-07 NOTE — Progress Notes (Signed)
Patient unavailable at time of appointment. Spoke to patient's wife. They will call me if his concerns persist to complete appointment at that time.

## 2020-09-08 ENCOUNTER — Ambulatory Visit: Payer: PPO | Admitting: Cardiology

## 2020-09-09 LAB — NOVEL CORONAVIRUS, NAA: SARS-CoV-2, NAA: NOT DETECTED

## 2020-09-09 LAB — SARS-COV-2, NAA 2 DAY TAT

## 2020-09-09 LAB — SPECIMEN STATUS REPORT

## 2020-09-14 ENCOUNTER — Ambulatory Visit (INDEPENDENT_AMBULATORY_CARE_PROVIDER_SITE_OTHER): Payer: PPO

## 2020-09-21 ENCOUNTER — Ambulatory Visit (INDEPENDENT_AMBULATORY_CARE_PROVIDER_SITE_OTHER): Payer: PPO

## 2020-09-28 ENCOUNTER — Ambulatory Visit (INDEPENDENT_AMBULATORY_CARE_PROVIDER_SITE_OTHER): Payer: PPO

## 2020-10-11 ENCOUNTER — Other Ambulatory Visit: Payer: Self-pay

## 2020-10-11 ENCOUNTER — Other Ambulatory Visit: Payer: Self-pay | Admitting: Cardiology

## 2020-10-11 ENCOUNTER — Ambulatory Visit: Payer: PPO | Admitting: Cardiology

## 2020-10-11 ENCOUNTER — Encounter: Payer: Self-pay | Admitting: Cardiology

## 2020-10-11 VITALS — BP 120/80 | HR 64 | Resp 16 | Ht 68.0 in | Wt 255.0 lb

## 2020-10-11 DIAGNOSIS — E7800 Pure hypercholesterolemia, unspecified: Secondary | ICD-10-CM | POA: Diagnosis not present

## 2020-10-11 DIAGNOSIS — I25118 Atherosclerotic heart disease of native coronary artery with other forms of angina pectoris: Secondary | ICD-10-CM | POA: Diagnosis not present

## 2020-10-11 DIAGNOSIS — I10 Essential (primary) hypertension: Secondary | ICD-10-CM | POA: Diagnosis not present

## 2020-10-11 MED ORDER — NITROGLYCERIN 0.4 MG SL SUBL: 0.4000 mg | SUBLINGUAL_TABLET | SUBLINGUAL | 1 refills | Status: DC | PRN

## 2020-10-11 MED ORDER — ACEBUTOLOL HCL 200 MG PO CAPS: 200.0000 mg | ORAL_CAPSULE | Freq: Two times a day (BID) | ORAL | 2 refills | Status: DC

## 2020-10-11 NOTE — Progress Notes (Signed)
Primary Physician/Referring:  Wilson SingerGosrani, Nimish C, MD  Patient ID: Omar Columbiaobert L Haldeman, male    DOB: 07/17/1946, 75 y.o.   MRN: [id number]  Chief Complaint  Patient presents with  . Hypertension  . Coronary Artery Disease  . Follow-up    1 year    HPI:    Omar [name]  is a 75 y.o.  Caucasian male who is fairly active, has history of known coronary artery disease and had undergone angioplasty to his ramus intermediate branch with drug-eluting stent  Promus stent on 06/04/2012., here for routine anual CAD f/u. Wife at bedside. His PMD is significant for DM, hypertension, mixed hyperlipidemia, PAD and CAD.  He has had 2 episodes of DVT 1 after long travel in 2016 and the second 1 spontaneously and since then has been on chronic anticoagulation with Eliquis.  He does use support stockings during winter. He has chronic dyspnea.  The past 2 years he has started to lose weight, so far he has lost about 45 pounds in weight.  He is feeling well, dyspnea has also improved, states that he is feeling the best he has in a while.  He has not had any chest pain, no palpitations or dizziness or syncope or leg edema.  Denies symptoms of claudication.   Past Medical History:  Diagnosis Date  . Abnormal EKG    Inferior Q waves  . BPH (benign prostatic hyperplasia)   . Chronic anticoagulation 01/16/2017  . Coronary artery disease   . Degenerative joint disease   . Diabetes mellitus, type 2 (HCC)    No insulin  . DVT (deep venous thrombosis) (HCC) 4 -5 yrs ago  . DVT of leg (deep venous thrombosis) (HCC) 07/02/2015  . Heart murmur   . Hepatic steatosis   . Heterozygous factor V Leiden mutation (HCC) 01/16/2017  . Hyperlipidemia   . 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 Meningioma (HCC)    Left frontal; CVA identified by MRI; no neurologic symptoms  . Neuropathy   . Obesity   . Primary localized osteoarthritis of right hip  06/24/2019  . Sleep apnea 2008   Dr. Gerilyn Pilgrimoonquah interpreted the study-CPAP recommended, but refused by patient  . Stroke [facility](HCC)    no deficits from stroke, "Didnt know I had one when they say I did".   Past Surgical History:  Procedure Laterality Date  . BIOPSY  04/16/2019   Procedure: BIOPSY;  Surgeon: West BaliFields, Sandi L, MD;  [facility];  Service: Endoscopy;;  . CATARACT EXTRACTION W/PHACO Left 04/03/2016   Procedure: CATARACT EXTRACTION PHACO AND INTRAOCULAR LENS PLACEMENT LEFT EYE CDE=21.76;  Surgeon: Susa Simmondsarroll F Haines, MD;  Location: [facility];  Service: Ophthalmology;  Laterality: Left;  . CATARACT EXTRACTION W/PHACO Right 06/19/2016   Procedure: CATARACT EXTRACTION PHACO AND INTRAOCULAR LENS PLACEMENT; CDE:  11.56;  Surgeon: Susa Simmondsarroll F Haines, MD;  Location: [facility];  Service: Ophthalmology;  Laterality: Right;  . COLONOSCOPY N/A 04/16/2019   Procedure: COLONOSCOPY;  Surgeon: West BaliFields, Sandi L, MD;  [facility];  Service: Endoscopy;  Laterality: N/A;  8:30am  . COLONOSCOPY W/ POLYPECTOMY  05/2008   polypectomy x4-adenomatous; internal hemorrhoids  . CORONARY ANGIOPLASTY WITH STENT PLACEMENT  06/04/2012     DES    to RI  . CORONARY STENT PLACEMENT     x 1  . DUPUYTREN / PALMAR FASCIOTOMY  2009   rt hand-dsc-went home same  day x2  . ESOPHAGOGASTRODUODENOSCOPY N/A 04/16/2019   Procedure: ESOPHAGOGASTRODUODENOSCOPY (EGD);  Surgeon: Danie Binder, MD;  [facility];  Service: Endoscopy;  Laterality: N/A;  . LEFT HEART CATHETERIZATION WITH CORONARY ANGIOGRAM N/A 05/28/2012   Procedure: LEFT HEART CATHETERIZATION WITH CORONARY ANGIOGRAM;  Surgeon: Laverda Page, MD;  Location: [facility] CATH LAB;  Service: Cardiovascular;  Laterality: N/A;  . PERCUTANEOUS CORONARY STENT INTERVENTION (PCI-S) N/A 06/04/2012   Procedure: PERCUTANEOUS CORONARY STENT INTERVENTION (PCI-S);  Surgeon: Laverda Page, MD;  Location: [facility] CATH LAB;  Service: Cardiovascular;  Laterality: N/A;  .  POLYPECTOMY  04/16/2019   Procedure: POLYPECTOMY;  Surgeon: Danie Binder, MD;  [facility];  Service: Endoscopy;;  . TONSILLECTOMY    . TOTAL HIP ARTHROPLASTY Right 06/24/2019   Procedure: TOTAL HIP ARTHROPLASTY;  Surgeon: Marchia Bond, MD;  [facility];  Service: Orthopedics;  Laterality: Right;  . VENTRAL HERNIA REPAIR  2010   umb hernia-[facility]-went home same day    Social History   Tobacco Use  . Smoking status: Never Smoker  . Smokeless tobacco: Never Used  Substance Use Topics  . Alcohol use: No   Marital Status: Married   ROS  Review of Systems  Constitutional: Positive for weight loss (40 Lbs intentional). Negative for malaise/fatigue and weight gain.  Cardiovascular: Positive for dyspnea on exertion and leg swelling (minimal). Negative for syncope.  Endocrine: Negative for cold intolerance.  Musculoskeletal: Positive for back pain. Negative for joint swelling.  Gastrointestinal: Negative for abdominal pain, change in bowel habit, hematochezia and melena.  All other systems reviewed and are negative.  Objective   Vitals with BMI 10/11/2020 10/11/2020 07/22/2020  Height - 5\' 8"  5\' 8"   Weight - 255 lbs 258 lbs 13 oz  BMI - [id number]  Systolic [id number] [id number]  Diastolic 80 78 82  Pulse - 64 61      Physical Exam Constitutional:      General: He is not in acute distress.    Appearance: He is well-developed.     Comments: Morbidly obese  HENT:     Head: Atraumatic.  Eyes:     Conjunctiva/sclera: Conjunctivae normal.  Neck:     Thyroid: No thyromegaly.     Comments: Short neck and difficult to evaluate JVP Cardiovascular:     Rate and Rhythm: Normal rate and regular rhythm.     Pulses:          Carotid pulses are 2+ on the right side and 2+ on the left side.      Popliteal pulses are 2+ on the right side and 2+ on the left side.       Dorsalis pedis pulses are 0 on the right side and 0 on the left side.       Posterior tibial pulses are 0 on the  right side and 2+ on the left side.     Heart sounds: Normal heart sounds. No murmur heard. No gallop.      Comments: Femoral  pulse difficult to feel due to patient's body habitus.   2+ Pitting ankle edema bilateral. Chronic venostasis skin change noted.  Pulmonary:     Effort: Pulmonary effort is normal.     Breath sounds: Normal breath sounds.  Abdominal:     General: Bowel sounds are normal.     Palpations: Abdomen is soft.     Comments: Obese. Pannus present  Musculoskeletal:        General: Normal  range of motion.     Cervical back: Neck supple.  Skin:    General: Skin is warm and dry.  Neurological:     Mental Status: He is alert.    Laboratory examination:    Recent Labs    05/18/20 0904  NA 139  K 4.1  CL 101  CO2 28  GLUCOSE 159*  BUN 16  CREATININE 1.13  CALCIUM 9.7  GFRNONAA 63  GFRAA 73   CrCl cannot be calculated (Patient's most recent lab result is older than the maximum 21 days allowed.).  CMP Latest Ref Rng & Units 05/18/2020 10/08/2019 06/25/2019  Glucose 65 - 99 mg/dL 159(H) 152(H) 312(H)  BUN 7 - 25 mg/dL 16 22 31(H)  Creatinine 0.70 - 1.18 mg/dL 1.13 1.00 1.13  Sodium 135 - 146 mmol/L 139 139 132(L)  Potassium 3.5 - 5.3 mmol/L 4.1 4.2 4.6  Chloride 98 - 110 mmol/L 101 102 102  CO2 20 - 32 mmol/L 28 24 20(L)  Calcium 8.6 - 10.3 mg/dL 9.7 10.0 8.0(L)  Total Protein 6.1 - 8.1 g/dL 6.3 6.5 -  Total Bilirubin 0.2 - 1.2 mg/dL 0.5 0.3 -  Alkaline Phos 40 - 115 U/L - - -  AST 10 - 35 U/L 12 11 -  ALT 9 - 46 U/L 10 12 -   CBC Latest Ref Rng & Units 05/18/2020 06/25/2019 06/18/2019  WBC 3.8 - 10.8 Thousand/uL 8.2 12.6(H) 8.7  Hemoglobin 13.2 - 17.1 g/dL 15.5 10.7(L) 14.6  Hematocrit 38.5 - 50.0 % 46.4 33.2(L) 43.5  Platelets 140 - 400 Thousand/uL 286 237 300   Lipid Panel     Component Value Date/Time   CHOL 196 10/08/2019 1141   TRIG 183 (H) 10/08/2019 1141   HDL 55 10/08/2019 1141   CHOLHDL 3.6 10/08/2019 1141   VLDL 28 04/17/2016 0900    LDLCALC 111 (H) 10/08/2019 1141   HEMOGLOBIN A1C Lab Results  Component Value Date   HGBA1C 7.2 (H) 05/18/2020   MPG 160 05/18/2020   TSH No results for input(s): TSH in the last 8760 hours. Medications and allergies  No Known Allergies   Current Outpatient Medications on File Prior to Visit  Medication Sig Dispense Refill  . apixaban (ELIQUIS) 5 MG TABS tablet Take 5 mg by mouth 2 (two) times daily.    . Calcium-Vitamin D-Vitamin K 650-12.5-40 MG-MCG-MCG CHEW Chew 2 tablets by mouth at bedtime.    . cetirizine (ZYRTEC ALLERGY) 10 MG tablet Take 1 tablet (10 mg total) by mouth daily. 30 tablet 0  . empagliflozin (JARDIANCE) 25 MG TABS tablet Take 12.5 mg by mouth daily.    Marland Kitchen gabapentin (NEURONTIN) 100 MG capsule TAKE (1) CAPSULE BY MOUTH TWICE DAILY. (Patient taking differently: Take 100 mg by mouth 2 (two) times daily.) 60 capsule 3  . lisinopril-hydrochlorothiazide (PRINZIDE,ZESTORETIC) 20-25 MG per tablet Take 1 tablet by mouth daily.    . metFORMIN (GLUCOPHAGE) 1000 MG tablet TAKE (1) TABLET BY MOUTH TWICE DAILY. (Patient taking differently: Take 1,000 mg by mouth 2 (two) times a day.) 60 tablet 3  . Multiple Vitamins-Minerals (ONE-A-DAY 50 PLUS PO) Take 1 tablet by mouth at bedtime.     Marland Kitchen omeprazole (PRILOSEC) 20 MG capsule Take 1 capsule (20 mg total) by mouth daily before breakfast. 90 capsule 1  . pravastatin (PRAVACHOL) 40 MG tablet Take 40 mg by mouth at bedtime.     Marland Kitchen spironolactone (ALDACTONE) 50 MG tablet Take 1 tablet (50 mg total) by mouth every morning.  90 tablet 1  . tamsulosin (FLOMAX) 0.4 MG CAPS capsule Take 0.4 mg by mouth daily.    . Testosterone 20 % CREA Apply 100 mg topically daily. 100 g 0  . thyroid (NP THYROID) 60 MG tablet Take 1 tablet (60 mg total) by mouth daily before breakfast. 30 tablet 3  . torsemide (DEMADEX) 20 MG tablet Take 20 mg by mouth daily as needed (swelling).     . traZODone (DESYREL) 100 MG tablet Take 100 mg by mouth at bedtime.    .  verapamil (CALAN-SR) 240 MG CR tablet Take 1 tablet (240 mg total) by mouth at bedtime. 90 tablet 0  . vitamin B-12 (CYANOCOBALAMIN) 500 MCG tablet Take 500 mcg by mouth daily.    . Vitamin D, Ergocalciferol, (DRISDOL) 1.25 MG (50000 UT) CAPS capsule TAKE 1 CAPSULE BY MOUTH ONCE A WEEK. 12 capsule 0  . benzonatate (TESSALON) 100 MG capsule Take 1 capsule (100 mg total) by mouth every 8 (eight) hours. 30 capsule 0  . fluticasone (FLONASE) 50 MCG/ACT nasal spray Place 1 spray into both nostrils daily for 14 days. 16 g 0  . testosterone cypionate (DEPO-TESTOSTERONE) 200 MG/ML injection Inject 0.5 mLs (100 mg total) into the muscle every 7 (seven) days. 10 mL 0   No current facility-administered medications on file prior to visit.     Radiology:  No results found. Cardiac Studies:   Heart Cath 06/04/12: Ostial Ramus intermediate 2.75x12 Promus element DES placed. Has anamolous circumflex coronary artery from right coronary cusp.  Outside echo 02/23/12: Normal LVEF, 55%. Cannot exclude wall motion abnormality. Mild aortic calcification. No aortic stenosis.   Carotid artery duplex 07/2016: Mild bilateral common carotid, bifurcation, proximal ICA stenosis with mild diffuse plaque. Antegrade vertebral flow. No significant change from Carotid artery duplex 06/19/13  Lexiscan Myoview Stress Test 04/07/2019: Lexiscan stress test was performed. Stress EKG is non-diagnostic, as this is pharmacological stress test. There was baseline artifact as well making EKG difficult to interpret.  The perfusion imaging study demonstrates diaphragmatic attenuation artifact in the inferior wall without reversible ischemia or scar.  LV systolic function was normal without wall motion abnormality and calculated at 70%. This is a low risk study.   EKG:   EKG 10/11/2020: Normal sinus rhythm at rate of 66 bpm, left axis deviation, left anterior fascicular block.  Cannot exclude inferior infarct old.  Right bundle branch  block.  Poor R progression, cannot exclude anterolateral infarct old.  Nonspecific T abnormality. nasal stuffiness No significant change from 08/30/2019.  Assessment     ICD-10-CM   1. Coronary artery disease of native artery of native heart with stable angina pectoris (HCC)  I25.118 nitroGLYCERIN (NITROSTAT) 0.4 MG SL tablet    DISCONTINUED: nitroGLYCERIN (NITROSTAT) 0.4 MG SL tablet  2. Essential hypertension  I10 EKG 12-Lead    acebutolol (SECTRAL) 200 MG capsule  3. Hypercholesteremia  E78.00     Meds ordered this encounter  Medications  . DISCONTD: nitroGLYCERIN (NITROSTAT) 0.4 MG SL tablet    Sig: Place 1 tablet (0.4 mg total) under the tongue every 5 (five) minutes as needed for chest pain.    Dispense:  25 tablet    Refill:  1  . acebutolol (SECTRAL) 200 MG capsule    Sig: Take 1 capsule (200 mg total) by mouth 2 (two) times daily.    Dispense:  60 capsule    Refill:  2  . nitroGLYCERIN (NITROSTAT) 0.4 MG SL tablet    Sig: Place 1  tablet (0.4 mg total) under the tongue every 5 (five) minutes as needed for chest pain.    Dispense:  25 tablet    Refill:  1   Medications Discontinued During This Encounter  Medication Reason  . predniSONE (DELTASONE) 10 MG tablet Completed Course  . sennosides-docusate sodium (SENOKOT-S) 8.6-50 MG tablet No longer needed (for PRN medications)  . testosterone cypionate (DEPOTESTOSTERONE CYPIONATE) injection 200 mg Error  . nitroGLYCERIN (NITROSTAT) 0.4 MG SL tablet Reorder  . BYSTOLIC 20 MG TABS Cost of medication  . nitroGLYCERIN (NITROSTAT) 0.4 MG SL tablet      Recommendations:    Omar [name] is a 75 y.o. Caucasian male who is fairly active, has history of known coronary artery disease and had undergone angioplasty to his ramus intermediate branch with drug-eluting stent  Promus stent on 06/04/2012., here for routine anual CAD f/u. Wife at bedside. His PMD is significant for DM, hypertension, mixed hyperlipidemia, PAD and CAD.  He has  had 2 episodes of DVT 1 after long travel in 2016 and the second 1 spontaneously and since then has been on chronic anticoagulation with Eliquis.  He does use support stockings during winter. He has chronic dyspnea.  He has been losing weight and has lost approximately 40 pounds in the past 2 years.  Dyspnea has improved and stable, he has not had any recurrence of angina pectoris.  I refilled his nitroglycerin.  With regard to hypertension, blood pressure is well controlled.  However due to cost will discontinue Bystolic and try acebutolol 200 mL mg p.o. twice daily.  He has an appointment to see his PCP in 3 to 4 weeks, will be appropriate timing to reevaluate his blood pressure and tolerability and if he does well we could continue present medicines otherwise he needs to go back on being on Bystolic although its more expensive.  His lipids are >4-year-old, needs lipid profile testing, goal LDL <70.  With weight loss I expect his LDL to have improved.  He is presently on pravastatin, could consider switching him to Crestor 20 mg daily, patient would like to discuss this further with his PCP as well.  Otherwise from cardiac standpoint he is doing well and stable and I will see him back in 1 year or sooner if problems.    Adrian Prows, MD, [facility] 10/11/2020, 3:14 PM Office: [phone] Pager: [phone]

## 2020-10-12 ENCOUNTER — Ambulatory Visit (INDEPENDENT_AMBULATORY_CARE_PROVIDER_SITE_OTHER): Payer: PPO | Admitting: Internal Medicine

## 2020-10-26 ENCOUNTER — Encounter (INDEPENDENT_AMBULATORY_CARE_PROVIDER_SITE_OTHER): Payer: Self-pay | Admitting: Internal Medicine

## 2020-10-26 ENCOUNTER — Ambulatory Visit (INDEPENDENT_AMBULATORY_CARE_PROVIDER_SITE_OTHER): Payer: PPO | Admitting: Internal Medicine

## 2020-10-26 VITALS — BP 108/72 | HR 63 | Temp 97.1°F | Ht 68.0 in | Wt 251.6 lb

## 2020-10-26 DIAGNOSIS — Z1159 Encounter for screening for other viral diseases: Secondary | ICD-10-CM | POA: Diagnosis not present

## 2020-10-26 DIAGNOSIS — E782 Mixed hyperlipidemia: Secondary | ICD-10-CM

## 2020-10-26 DIAGNOSIS — I10 Essential (primary) hypertension: Secondary | ICD-10-CM

## 2020-10-26 DIAGNOSIS — Z125 Encounter for screening for malignant neoplasm of prostate: Secondary | ICD-10-CM | POA: Diagnosis not present

## 2020-10-26 DIAGNOSIS — R7989 Other specified abnormal findings of blood chemistry: Secondary | ICD-10-CM | POA: Diagnosis not present

## 2020-10-26 DIAGNOSIS — E1159 Type 2 diabetes mellitus with other circulatory complications: Secondary | ICD-10-CM

## 2020-10-26 NOTE — Progress Notes (Signed)
Metrics: Intervention Frequency ACO  Documented Smoking Status Yearly  Screened one or more times in 24 months  Cessation Counseling or  Active cessation medication Past 24 months  Past 24 months   Guideline developer: UpToDate (See UpToDate for funding source) Date Released: 2014       Wellness Office Visit  Subjective:  Patient ID: Omar [name], male    DOB: 10/10/1945  Age: 75 y.o. MRN: [id number]  CC: This man comes in for follow-up of testosterone therapy, diabetes, hypertension, dyslipidemia, morbid obesity. HPI  Overall, he is doing reasonably well.  He has seen his cardiologist and will see him in 1 year again and he appears to be stable from a cardiac standpoint. He is tolerating testosterone cream but is not compliant on every single day with it.  He certainly feels improved with taking it. He continues with Jardiance and Metformin for his diabetes.  His hemoglobin A1c was 7.2% on the last time it was checked. He has seen an ophthalmologist and has no retinal problems due to diabetes.  He does have cataracts apparently. He continues with statin therapy in the face of coronary artery disease. He has tolerated desiccated NP thyroid restarted last visit but says he has diarrhea which may be a side effect.  He does not get it every day. Past Medical History:  Diagnosis Date  . Abnormal EKG    Inferior Q waves  . BPH (benign prostatic hyperplasia)   . Chronic anticoagulation 01/16/2017  . Coronary artery disease   . Degenerative joint disease   . Diabetes mellitus, type 2 (HCC)    No insulin  . DVT (deep venous thrombosis) (HCC) 4 -5 yrs ago  . DVT of leg (deep venous thrombosis) (HCC) 07/02/2015  . Heart murmur   . Hepatic steatosis   . Heterozygous factor V Leiden mutation (HCC) 01/16/2017  . Hyperlipidemia   . 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 Meningioma (HCC)    Left frontal; CVA  identified by MRI; no neurologic symptoms  . Neuropathy   . Obesity   . Primary localized osteoarthritis of right hip 06/24/2019  . Sleep apnea 2008   Dr. Gerilyn Pilgrimoonquah interpreted the study-CPAP recommended, but refused by patient  . Stroke [facility](HCC)    no deficits from stroke, "Didnt know I had one when they say I did".   Past Surgical History:  Procedure Laterality Date  . BIOPSY  04/16/2019   Procedure: BIOPSY;  Surgeon: West BaliFields, Sandi L, MD;  [facility];  Service: Endoscopy;;  . CATARACT EXTRACTION W/PHACO Left 04/03/2016   Procedure: CATARACT EXTRACTION PHACO AND INTRAOCULAR LENS PLACEMENT LEFT EYE CDE=21.76;  Surgeon: Susa Simmondsarroll F Haines, MD;  [facility];  Service: Ophthalmology;  Laterality: Left;  . CATARACT EXTRACTION W/PHACO Right 06/19/2016   Procedure: CATARACT EXTRACTION PHACO AND INTRAOCULAR LENS PLACEMENT; CDE:  11.56;  Surgeon: Susa Simmondsarroll F Haines, MD;  [facility];  Service: Ophthalmology;  Laterality: Right;  . COLONOSCOPY N/A 04/16/2019   Procedure: COLONOSCOPY;  Surgeon: West BaliFields, Sandi L, MD;  [facility];  Service: Endoscopy;  Laterality: N/A;  8:30am  . COLONOSCOPY W/ POLYPECTOMY  05/2008   polypectomy x4-adenomatous; internal hemorrhoids  . CORONARY ANGIOPLASTY WITH STENT PLACEMENT  06/04/2012     DES    to RI  . CORONARY STENT PLACEMENT     x 1  . DUPUYTREN / PALMAR FASCIOTOMY  2009  rt hand-dsc-went home same day x2  . ESOPHAGOGASTRODUODENOSCOPY N/A 04/16/2019   Procedure: ESOPHAGOGASTRODUODENOSCOPY (EGD);  Surgeon: Danie Binder, MD;  [facility];  Service: Endoscopy;  Laterality: N/A;  . LEFT HEART CATHETERIZATION WITH CORONARY ANGIOGRAM N/A 05/28/2012   Procedure: LEFT HEART CATHETERIZATION WITH CORONARY ANGIOGRAM;  Surgeon: Laverda Page, MD;  Location: [facility] CATH LAB;  Service: Cardiovascular;  Laterality: N/A;  . PERCUTANEOUS CORONARY STENT INTERVENTION (PCI-S) N/A 06/04/2012   Procedure: PERCUTANEOUS CORONARY STENT INTERVENTION  (PCI-S);  Surgeon: Laverda Page, MD;  Location: [facility] CATH LAB;  Service: Cardiovascular;  Laterality: N/A;  . POLYPECTOMY  04/16/2019   Procedure: POLYPECTOMY;  Surgeon: Danie Binder, MD;  [facility];  Service: Endoscopy;;  . TONSILLECTOMY    . TOTAL HIP ARTHROPLASTY Right 06/24/2019   Procedure: TOTAL HIP ARTHROPLASTY;  Surgeon: Marchia Bond, MD;  [facility];  Service: Orthopedics;  Laterality: Right;  . VENTRAL HERNIA REPAIR  2010   umb hernia-[facility]-went home same day     Family History  Problem Relation Age of Onset  . Cancer Mother   . Cancer Father   . Coronary artery disease Neg Hx     Social History   Social History Narrative   Married for 55 yrs,lives with wife.Works part-time with [employer].   Social History   Tobacco Use  . Smoking status: Never Smoker  . Smokeless tobacco: Never Used  Substance Use Topics  . Alcohol use: No    Current Meds  Medication Sig  . acebutolol (SECTRAL) 200 MG capsule Take 1 capsule (200 mg total) by mouth 2 (two) times daily.  Marland Kitchen apixaban (ELIQUIS) 5 MG TABS tablet Take 5 mg by mouth 2 (two) times daily.  . Calcium-Vitamin D-Vitamin K 650-12.5-40 MG-MCG-MCG CHEW Chew 2 tablets by mouth at bedtime.  . cetirizine (ZYRTEC ALLERGY) 10 MG tablet Take 1 tablet (10 mg total) by mouth daily.  . empagliflozin (JARDIANCE) 25 MG TABS tablet Take 12.5 mg by mouth daily.  . fluticasone (FLONASE) 50 MCG/ACT nasal spray Place 1 spray into both nostrils daily for 14 days.  Marland Kitchen gabapentin (NEURONTIN) 100 MG capsule TAKE (1) CAPSULE BY MOUTH TWICE DAILY. (Patient taking differently: Take 100 mg by mouth 2 (two) times daily.)  . lisinopril-hydrochlorothiazide (PRINZIDE,ZESTORETIC) 20-25 MG per tablet Take 1 tablet by mouth daily.  . metFORMIN (GLUCOPHAGE) 1000 MG tablet TAKE (1) TABLET BY MOUTH TWICE DAILY. (Patient taking differently: Take 1,000 mg by mouth 2 (two) times a day.)  . Multiple Vitamins-Minerals  (ONE-A-DAY 50 PLUS PO) Take 1 tablet by mouth at bedtime.   . nitroGLYCERIN (NITROSTAT) 0.4 MG SL tablet PLACE 1 TAB UNDER TONGUE EVERY 5 MIN IF NEEDED FOR CHEST PAIN. MAY USE 3 TIMES.NO RELIEF CALL 911.  . omeprazole (PRILOSEC) 20 MG capsule Take 1 capsule (20 mg total) by mouth daily before breakfast.  . pravastatin (PRAVACHOL) 40 MG tablet Take 40 mg by mouth at bedtime.   Marland Kitchen spironolactone (ALDACTONE) 50 MG tablet Take 1 tablet (50 mg total) by mouth every morning.  . tamsulosin (FLOMAX) 0.4 MG CAPS capsule Take 0.4 mg by mouth daily.  . Testosterone 20 % CREA Apply 100 mg topically daily.  Marland Kitchen thyroid (NP THYROID) 60 MG tablet Take 1 tablet (60 mg total) by mouth daily before breakfast.  . torsemide (DEMADEX) 20 MG tablet Take 20 mg by mouth daily as needed (swelling).   . traZODone (DESYREL) 100 MG tablet Take 100 mg by mouth at bedtime.  Marland Kitchen  verapamil (CALAN-SR) 240 MG CR tablet Take 1 tablet (240 mg total) by mouth at bedtime.  . vitamin B-12 (CYANOCOBALAMIN) 500 MCG tablet Take 500 mcg by mouth daily.      Depression screen [facility] 2/9 10/26/2020 12/24/2019 10/08/2019 08/08/2018 11/16/2017  Decreased Interest 0 0 0 0 0  Down, Depressed, Hopeless 0 0 0 0 0  PHQ - 2 Score 0 0 0 0 0  Altered sleeping 0 - - - -  Tired, decreased energy 0 - - - -  Change in appetite 0 - - - -  Feeling bad or failure about yourself  0 - - - -  Trouble concentrating 0 - - - -  Moving slowly or fidgety/restless 0 - - - -  Suicidal thoughts 0 - - - -  PHQ-9 Score 0 - - - -  Difficult doing work/chores Not difficult at all - - - -  Some recent data might be hidden     Objective:   Today's Vitals: BP 108/72   Pulse 63   Temp (!) 97.1 F (36.2 C) (Temporal)   Ht 5\' 8"  (1.727 m)   Wt 251 lb 9.6 oz (114.1 kg)   SpO2 96%   BMI 38.26 kg/m  Vitals with BMI 10/26/2020 10/11/2020 10/11/2020  Height 5\' 8"  - 5\' 8"   Weight 251 lbs 10 oz - 255 lbs  BMI 09.73 - 53.29  Systolic 924 268 341  Diastolic 72 80 78  Pulse  63 - 64     Physical Exam   Although he remains morbidly obese, he has lost another 7 pounds since the last time I saw him.  Blood pressure is excellent.    Assessment   1. Low testosterone in male   2. Type 2 diabetes mellitus with vascular disease ([place])   3. Essential hypertension   4. Morbid obesity ([place])   5. Mixed hyperlipidemia   6. Special screening for malignant neoplasm of prostate   7. Encounter for hepatitis C screening test for low risk patient       Tests ordered Orders Placed This Encounter  Procedures  . COMPLETE METABOLIC PANEL WITH GFR  . Hemoglobin A1c  . Lipid panel  . PSA, Total with Reflex to PSA, Free  . T3, free  . TSH  . Hepatitis C antibody     Plan: 1. He will continue with all his diabetic medications and we will check an A1c. 2. He will continue for the time being with desiccated NP thyroid and I will check thyroid function. 3. Other blood work is ordered as above. 4. Follow-up in 3 months.   No orders of the defined types were placed in this encounter.   Doree Albee, MD

## 2020-10-27 LAB — HEPATITIS C ANTIBODY
Hepatitis C Ab: NONREACTIVE
SIGNAL TO CUT-OFF: 0.01 (ref <1.00)

## 2020-10-27 LAB — COMPLETE METABOLIC PANEL WITH GFR
AG Ratio: 2 (calc) (ref 1.0–2.5)
ALT: 14 U/L (ref 9–46)
AST: 16 U/L (ref 10–35)
Albumin: 4.2 g/dL (ref 3.6–5.1)
Alkaline phosphatase (APISO): 56 U/L (ref 35–144)
BUN/Creatinine Ratio: 19 (calc) (ref 6–22)
BUN: 25 mg/dL (ref 7–25)
CO2: 26 mmol/L (ref 20–32)
Calcium: 9.9 mg/dL (ref 8.6–10.3)
Chloride: 101 mmol/L (ref 98–110)
Creat: 1.3 mg/dL — ABNORMAL HIGH (ref 0.70–1.18)
GFR, Est African American: 61 mL/min/{1.73_m2} (ref >=60)
GFR, Est Non African American: 53 mL/min/{1.73_m2} — ABNORMAL LOW (ref >=60)
Globulin: 2.1 g/dL (calc) (ref 1.9–3.7)
Glucose, Bld: 170 mg/dL — ABNORMAL HIGH (ref 65–99)
Potassium: 4.7 mmol/L (ref 3.5–5.3)
Sodium: 139 mmol/L (ref 135–146)
Total Bilirubin: 0.6 mg/dL (ref 0.2–1.2)
Total Protein: 6.3 g/dL (ref 6.1–8.1)

## 2020-10-27 LAB — T3, FREE: T3, Free: 3.8 pg/mL (ref 2.3–4.2)

## 2020-10-27 LAB — PSA, TOTAL WITH REFLEX TO PSA, FREE: PSA, Total: 0.9 ng/mL (ref <=4.0)

## 2020-10-27 LAB — HEMOGLOBIN A1C
Hgb A1c MFr Bld: 7.7 % of total Hgb — ABNORMAL HIGH (ref <5.7)
Mean Plasma Glucose: 174 mg/dL
eAG (mmol/L): 9.7 mmol/L

## 2020-10-27 LAB — LIPID PANEL
Cholesterol: 161 mg/dL (ref <200)
HDL: 49 mg/dL (ref >=40)
LDL Cholesterol (Calc): 86 mg/dL (calc)
Non-HDL Cholesterol (Calc): 112 mg/dL (calc) (ref <130)
Total CHOL/HDL Ratio: 3.3 (calc) (ref <5.0)
Triglycerides: 163 mg/dL — ABNORMAL HIGH (ref <150)

## 2020-10-27 LAB — TSH: TSH: 0.62 mIU/L (ref 0.40–4.50)

## 2020-11-05 ENCOUNTER — Other Ambulatory Visit: Payer: Self-pay | Admitting: Nurse Practitioner

## 2020-11-05 ENCOUNTER — Other Ambulatory Visit: Payer: Self-pay | Admitting: Cardiology

## 2020-11-05 DIAGNOSIS — I25118 Atherosclerotic heart disease of native coronary artery with other forms of angina pectoris: Secondary | ICD-10-CM

## 2020-11-05 DIAGNOSIS — K219 Gastro-esophageal reflux disease without esophagitis: Secondary | ICD-10-CM

## 2020-11-15 ENCOUNTER — Encounter (INDEPENDENT_AMBULATORY_CARE_PROVIDER_SITE_OTHER): Payer: Self-pay | Admitting: Internal Medicine

## 2020-11-15 ENCOUNTER — Telehealth (INDEPENDENT_AMBULATORY_CARE_PROVIDER_SITE_OTHER): Payer: PPO | Admitting: Internal Medicine

## 2020-11-15 DIAGNOSIS — U071 COVID-19: Secondary | ICD-10-CM | POA: Diagnosis not present

## 2020-11-15 NOTE — Progress Notes (Signed)
Metrics: Intervention Frequency ACO  Documented Smoking Status Yearly  Screened one or more times in 24 months  Cessation Counseling or  Active cessation medication Past 24 months  Past 24 months   Guideline developer: UpToDate (See UpToDate for funding source) Date Released: 2014       Wellness Office Visit  Subjective:  Patient ID: Omar [name], male    DOB: 07-27-1946  Age: 75 y.o. MRN: [id number]  CC: This is an audio telemedicine visit with the permission of the patient who is at home and I am in my office.  I used 2 identifiers to identify the patient. COVID-19 disease. HPI  This patient tested COVID-19 positive yesterday.  He noticed he is loss of taste 3 days ago.  His wife had tested positive approximately 1 week ago.  He denies any other symptoms such as dyspnea, fever, body aches.  He has been fully vaccinated with 3 doses of COVID-19 vaccine. Past Medical History:  Diagnosis Date  . Abnormal EKG    Inferior Q waves  . BPH (benign prostatic hyperplasia)   . Chronic anticoagulation 01/16/2017  . Coronary artery disease   . Degenerative joint disease   . Diabetes mellitus, type 2 (HCC)    No insulin  . DVT (deep venous thrombosis) ([place]) 4 -5 yrs ago  . DVT of leg (deep venous thrombosis) ([place]) 07/02/2015  . Heart murmur   . Hepatic steatosis   . Heterozygous factor V Leiden mutation ([place]) 01/16/2017  . Hyperlipidemia   . 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 Meningioma (HCC)    Left frontal; CVA identified by MRI; no neurologic symptoms  . Neuropathy   . Obesity   . Primary localized osteoarthritis of right hip 06/24/2019  . Sleep apnea 2008   Dr. Merlene Laughter interpreted the study-CPAP recommended, but refused by patient  . Stroke [facility])    no deficits from stroke, "Didnt know I had one when they say I did".   Past Surgical History:  Procedure Laterality Date  . BIOPSY  04/16/2019    Procedure: BIOPSY;  Surgeon: Danie Binder, MD;  [facility];  Service: Endoscopy;;  . CATARACT EXTRACTION W/PHACO Left 04/03/2016   Procedure: CATARACT EXTRACTION PHACO AND INTRAOCULAR LENS PLACEMENT LEFT EYE CDE=21.76;  Surgeon: Williams Che, MD;  Location: [facility] ORS;  Service: Ophthalmology;  Laterality: Left;  . CATARACT EXTRACTION W/PHACO Right 06/19/2016   Procedure: CATARACT EXTRACTION PHACO AND INTRAOCULAR LENS PLACEMENT; CDE:  11.56;  Surgeon: Williams Che, MD;  Location: [facility] OR[facility];  Service: Ophthalmology;  Laterality: Right;  . COLONOSCOPY N/A 04/16/2019   Procedure: COLONOSCOPY;  Surgeon: Danie Binder, MD;  [facility];  Service: Endoscopy;  Laterality: N/A;  8:30am  . COLONOSCOPY W/ POLYPECTOMY  05/2008   polypectomy x4-adenomatous; internal hemorrhoids  . CORONARY ANGIOPLASTY WITH STENT PLACEMENT  06/04/2012     DES    to RI  . CORONARY STENT PLACEMENT     x 1  . DUPUYTREN / PALMAR FASCIOTOMY  2009   rt hand-dsc-went home same day x2  . ESOPHAGOGASTRODUODENOSCOPY N/A 04/16/2019   Procedure: ESOPHAGOGASTRODUODENOSCOPY (EGD);  Surgeon: Danie Binder, MD;  [facility];  Service: Endoscopy;  Laterality: N/A;  . LEFT HEART CATHETERIZATION WITH CORONARY ANGIOGRAM N/A 05/28/2012   Procedure: LEFT HEART CATHETERIZATION WITH CORONARY ANGIOGRAM;  Surgeon: Laverda Page, MD;  Location: [facility] CATH LAB;  Service: Cardiovascular;  Laterality: N/A;  . PERCUTANEOUS CORONARY STENT INTERVENTION (PCI-S) N/A 06/04/2012   Procedure: PERCUTANEOUS CORONARY STENT INTERVENTION (PCI-S);  Surgeon: Laverda Page, MD;  Location: [facility] CATH LAB;  Service: Cardiovascular;  Laterality: N/A;  . POLYPECTOMY  04/16/2019   Procedure: POLYPECTOMY;  Surgeon: Danie Binder, MD;  [facility];  Service: Endoscopy;;  . TONSILLECTOMY    . TOTAL HIP ARTHROPLASTY Right 06/24/2019   Procedure: TOTAL HIP ARTHROPLASTY;  Surgeon: Marchia Bond, MD;  [facility];  Service:  Orthopedics;  Laterality: Right;  . VENTRAL HERNIA REPAIR  2010   umb hernia-AP-went home same day     Family History  Problem Relation Age of Onset  . Cancer Mother   . Cancer Father   . Coronary artery disease Neg Hx     Social History   Social History Narrative   Married for 28 yrs,lives with wife.Works part-time with [employer].   Social History   Tobacco Use  . Smoking status: Never Smoker  . Smokeless tobacco: Never Used  Substance Use Topics  . Alcohol use: No    Current Meds  Medication Sig  . acebutolol (SECTRAL) 200 MG capsule Take 1 capsule (200 mg total) by mouth 2 (two) times daily.  Marland Kitchen apixaban (ELIQUIS) 5 MG TABS tablet Take 5 mg by mouth 2 (two) times daily.  . Calcium-Vitamin D-Vitamin K 650-12.5-40 MG-MCG-MCG CHEW Chew 2 tablets by mouth at bedtime.  . cetirizine (ZYRTEC ALLERGY) 10 MG tablet Take 1 tablet (10 mg total) by mouth daily.  . empagliflozin (JARDIANCE) 25 MG TABS tablet Take 12.5 mg by mouth daily.  Marland Kitchen gabapentin (NEURONTIN) 100 MG capsule TAKE (1) CAPSULE BY MOUTH TWICE DAILY. (Patient taking differently: Take 100 mg by mouth 2 (two) times daily.)  . lisinopril-hydrochlorothiazide (PRINZIDE,ZESTORETIC) 20-25 MG per tablet Take 1 tablet by mouth daily.  . metFORMIN (GLUCOPHAGE) 1000 MG tablet TAKE (1) TABLET BY MOUTH TWICE DAILY. (Patient taking differently: Take 1,000 mg by mouth 2 (two) times a day.)  . Multiple Vitamins-Minerals (ONE-A-DAY 50 PLUS PO) Take 1 tablet by mouth at bedtime.   . nitroGLYCERIN (NITROSTAT) 0.4 MG SL tablet PLACE 1 TAB UNDER TONGUE EVERY 5 MIN IF NEEDED FOR CHEST PAIN. MAY USE 3 TIMES.NO RELIEF CALL 911.  . omeprazole (PRILOSEC) 20 MG capsule Take 1 capsule (20 mg total) by mouth daily before breakfast.  . pravastatin (PRAVACHOL) 40 MG tablet Take 40 mg by mouth at bedtime.   Marland Kitchen spironolactone (ALDACTONE) 50 MG tablet Take 1 tablet (50 mg total) by mouth every morning.  . tamsulosin (FLOMAX) 0.4 MG  CAPS capsule Take 0.4 mg by mouth daily.  . Testosterone 20 % CREA Apply 100 mg topically daily.  Marland Kitchen thyroid (NP THYROID) 60 MG tablet Take 1 tablet (60 mg total) by mouth daily before breakfast.  . torsemide (DEMADEX) 20 MG tablet Take 20 mg by mouth daily as needed (swelling).   . traZODone (DESYREL) 100 MG tablet Take 100 mg by mouth at bedtime.  . verapamil (CALAN-SR) 240 MG CR tablet Take 1 tablet (240 mg total) by mouth at bedtime.  . vitamin B-12 (CYANOCOBALAMIN) 500 MCG tablet Take 500 mcg by mouth daily.  . Vitamin D, Ergocalciferol, (DRISDOL) 1.25 MG (50000 UT) CAPS capsule TAKE 1 CAPSULE BY MOUTH ONCE A WEEK.     [place] Office Visit from 10/26/2020 in [facility]  PHQ-9 Total Score 0      Objective:   Today's Vitals: There  were no vitals taken for this visit. Vitals with BMI 11/15/2020 10/26/2020 10/11/2020  Height (No Data) 5\' 8"  -  Weight (No Data) 251 lbs 10 oz -  BMI - 59.16 -  Systolic (No Data) 384 665  Diastolic (No Data) 72 80  Pulse - 63 -     Physical Exam   Virtual visit.  Appears to be alert and orientated and not dyspneic on the phone.    Assessment   1. COVID-19       Tests ordered No orders of the defined types were placed in this encounter.    Plan: 1. Since this patient is fully vaccinated and does not appear to have any systemic symptoms, I do not think he would really even be a candidate for COVID-19 treatment and he does not wish to be at this point referred for such.  His wife was referred and she was told that she was not a candidate for any therapy either.  He will continue to stay hydrated, take vitamin D3 and vitamin C and will let me know if he gets worse.  I recommended that he quarantine for approximately 7 days from today. 2. This phone call lasted 6 minutes and 36 seconds   No orders of the defined types were placed in this encounter.   Doree Albee, MD

## 2021-01-05 ENCOUNTER — Other Ambulatory Visit: Payer: Self-pay | Admitting: Cardiology

## 2021-01-05 DIAGNOSIS — I10 Essential (primary) hypertension: Secondary | ICD-10-CM

## 2021-01-27 ENCOUNTER — Encounter (INDEPENDENT_AMBULATORY_CARE_PROVIDER_SITE_OTHER): Payer: Self-pay | Admitting: Internal Medicine

## 2021-01-27 ENCOUNTER — Ambulatory Visit (INDEPENDENT_AMBULATORY_CARE_PROVIDER_SITE_OTHER): Payer: PPO | Admitting: Internal Medicine

## 2021-01-27 ENCOUNTER — Other Ambulatory Visit: Payer: Self-pay

## 2021-01-27 VITALS — BP 108/72 | HR 76 | Temp 97.1°F | Ht 68.0 in | Wt 254.2 lb

## 2021-01-27 DIAGNOSIS — E1159 Type 2 diabetes mellitus with other circulatory complications: Secondary | ICD-10-CM

## 2021-01-27 DIAGNOSIS — I10 Essential (primary) hypertension: Secondary | ICD-10-CM | POA: Diagnosis not present

## 2021-01-27 DIAGNOSIS — R7989 Other specified abnormal findings of blood chemistry: Secondary | ICD-10-CM

## 2021-01-27 DIAGNOSIS — E782 Mixed hyperlipidemia: Secondary | ICD-10-CM

## 2021-01-27 NOTE — Progress Notes (Signed)
Metrics: Intervention Frequency ACO  Documented Smoking Status Yearly  Screened one or more times in 24 months  Cessation Counseling or  Active cessation medication Past 24 months  Past 24 months   Guideline developer: UpToDate (See UpToDate for funding source) Date Released: 2014       Wellness Office Visit  Subjective:  Patient ID: Omar [name], male    DOB: [birth year]  Age: 75 y.o. MRN: [id number]  CC: This man comes in for follow-up of diabetes, hypertension, hypothyroidism, dyslipidemia and morbid obesity. HPI  He says he is doing reasonably well.  He does not check his blood sugar levels on a regular basis.  His last hemoglobin A1c was 7.7%, suboptimal but less than 8%.  He continues on Jardiance and metformin for his diabetes. He continues on testosterone therapy as before and levels have improved since his baseline. He continues on NP thyroid 60 mg daily for symptoms of thyroid hypofunction and to help with insulin resistance. He has been reviewed by his cardiologist and appears to be stable and will be followed up 1 year from that visit. He continues with all antihypertensive medications, his cardiologist recently changed 1 due to cost. Past Medical History:  Diagnosis Date  . Abnormal EKG    Inferior Q waves  . BPH (benign prostatic hyperplasia)   . Chronic anticoagulation 01/16/2017  . Coronary artery disease   . Degenerative joint disease   . Diabetes mellitus, type 2 (HCC)    No insulin  . DVT (deep venous thrombosis) ([place]) 4 -5 yrs ago  . DVT of leg (deep venous thrombosis) ([place]) 07/02/2015  . Heart murmur   . Hepatic steatosis   . Heterozygous factor V Leiden mutation ([place]) 01/16/2017  . Hyperlipidemia   . 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 Meningioma (HCC)    Left frontal; CVA identified by MRI; no neurologic symptoms  . Neuropathy   . Obesity   . Primary localized osteoarthritis  of right hip 06/24/2019  . Sleep apnea 2008   Dr. Merlene Laughter interpreted the study-CPAP recommended, but refused by patient  . Stroke [facility])    no deficits from stroke, "Didnt know I had one when they say I did".   Past Surgical History:  Procedure Laterality Date  . BIOPSY  04/16/2019   Procedure: BIOPSY;  Surgeon: Danie Binder, MD;  [facility];  Service: Endoscopy;;  . CATARACT EXTRACTION W/PHACO Left 04/03/2016   Procedure: CATARACT EXTRACTION PHACO AND INTRAOCULAR LENS PLACEMENT LEFT EYE CDE=21.76;  Surgeon: Williams Che, MD;  Location: [facility];  Service: Ophthalmology;  Laterality: Left;  . CATARACT EXTRACTION W/PHACO Right 06/19/2016   Procedure: CATARACT EXTRACTION PHACO AND INTRAOCULAR LENS PLACEMENT; CDE:  11.56;  Surgeon: Williams Che, MD;  Location: [facility];  Service: Ophthalmology;  Laterality: Right;  . COLONOSCOPY N/A 04/16/2019   Procedure: COLONOSCOPY;  Surgeon: Danie Binder, MD;  [facility];  Service: Endoscopy;  Laterality: N/A;  8:30am  . COLONOSCOPY W/ POLYPECTOMY  05/2008   polypectomy x4-adenomatous; internal hemorrhoids  . CORONARY ANGIOPLASTY WITH STENT PLACEMENT  06/04/2012     DES    to RI  . CORONARY STENT PLACEMENT     x 1  . DUPUYTREN / PALMAR FASCIOTOMY  2009   rt hand-dsc-went home same day x2  . ESOPHAGOGASTRODUODENOSCOPY N/A 04/16/2019   Procedure: ESOPHAGOGASTRODUODENOSCOPY (EGD);  Surgeon: Danie Binder, MD;  [facility];  Service: Endoscopy;  Laterality: N/A;  . LEFT HEART CATHETERIZATION WITH CORONARY ANGIOGRAM N/A 05/28/2012   Procedure: LEFT HEART CATHETERIZATION WITH CORONARY ANGIOGRAM;  Surgeon: Laverda Page, MD;  Location: [facility] CATH LAB;  Service: Cardiovascular;  Laterality: N/A;  . PERCUTANEOUS CORONARY STENT INTERVENTION (PCI-S) N/A 06/04/2012   Procedure: PERCUTANEOUS CORONARY STENT INTERVENTION (PCI-S);  Surgeon: Laverda Page, MD;  Location: [facility] CATH LAB;  Service: Cardiovascular;  Laterality: N/A;   . POLYPECTOMY  04/16/2019   Procedure: POLYPECTOMY;  Surgeon: Danie Binder, MD;  [facility];  Service: Endoscopy;;  . TONSILLECTOMY    . TOTAL HIP ARTHROPLASTY Right 06/24/2019   Procedure: TOTAL HIP ARTHROPLASTY;  Surgeon: Marchia Bond, MD;  [facility];  Service: Orthopedics;  Laterality: Right;  . VENTRAL HERNIA REPAIR  2010   umb hernia-AP-went home same day     Family History  Problem Relation Age of Onset  . Cancer Mother   . Cancer Father   . Coronary artery disease Neg Hx     Social History   Social History Narrative   Married for 63 yrs,lives with wife.Works part-time with [employer].   Social History   Tobacco Use  . Smoking status: Never Smoker  . Smokeless tobacco: Never Used  Substance Use Topics  . Alcohol use: No    Current Meds  Medication Sig  . acebutolol (SECTRAL) 200 MG capsule TAKE (1) CAPSULE BY MOUTH TWICE DAILY.  Marland Kitchen apixaban (ELIQUIS) 5 MG TABS tablet Take 5 mg by mouth 2 (two) times daily.  . Calcium-Vitamin D-Vitamin K 650-12.5-40 MG-MCG-MCG CHEW Chew 2 tablets by mouth at bedtime.  . cetirizine (ZYRTEC ALLERGY) 10 MG tablet Take 1 tablet (10 mg total) by mouth daily.  . empagliflozin (JARDIANCE) 25 MG TABS tablet Take 12.5 mg by mouth daily.  Marland Kitchen gabapentin (NEURONTIN) 100 MG capsule TAKE (1) CAPSULE BY MOUTH TWICE DAILY. (Patient taking differently: Take 100 mg by mouth 2 (two) times daily.)  . lisinopril-hydrochlorothiazide (PRINZIDE,ZESTORETIC) 20-25 MG per tablet Take 1 tablet by mouth daily.  . metFORMIN (GLUCOPHAGE) 1000 MG tablet TAKE (1) TABLET BY MOUTH TWICE DAILY. (Patient taking differently: Take 1,000 mg by mouth 2 (two) times a day.)  . Multiple Vitamins-Minerals (ONE-A-DAY 50 PLUS PO) Take 1 tablet by mouth at bedtime.   . nitroGLYCERIN (NITROSTAT) 0.4 MG SL tablet PLACE 1 TAB UNDER TONGUE EVERY 5 MIN IF NEEDED FOR CHEST PAIN. MAY USE 3 TIMES.NO RELIEF CALL 911.  . omeprazole (PRILOSEC) 20 MG  capsule Take 1 capsule (20 mg total) by mouth daily before breakfast.  . pravastatin (PRAVACHOL) 40 MG tablet Take 40 mg by mouth at bedtime.   Marland Kitchen spironolactone (ALDACTONE) 50 MG tablet Take 1 tablet (50 mg total) by mouth every morning.  . tamsulosin (FLOMAX) 0.4 MG CAPS capsule Take 0.4 mg by mouth daily.  . Testosterone 20 % CREA Apply 100 mg topically daily.  Marland Kitchen thyroid (NP THYROID) 60 MG tablet Take 1 tablet (60 mg total) by mouth daily before breakfast.  . torsemide (DEMADEX) 20 MG tablet Take 20 mg by mouth daily as needed (swelling).   . traZODone (DESYREL) 100 MG tablet Take 100 mg by mouth at bedtime.  . verapamil (CALAN-SR) 240 MG CR tablet Take 1 tablet (240 mg total) by mouth at bedtime.  . vitamin B-12 (CYANOCOBALAMIN) 500 MCG tablet Take 500 mcg by mouth daily.  . Vitamin D, Ergocalciferol, (DRISDOL) 1.25 MG (50000 UT) CAPS capsule TAKE 1  CAPSULE BY MOUTH ONCE A WEEK.     [place] Office Visit from 01/27/2021 in [facility]  PHQ-9 Total Score 0      Objective:   Today's Vitals: BP 108/72   Pulse 76   Temp (!) 97.1 F (36.2 C) (Temporal)   Ht 5\' 8"  (1.727 m)   Wt 254 lb 3.2 oz (115.3 kg)   SpO2 96%   BMI 38.65 kg/m  Vitals with BMI 01/27/2021 11/15/2020 10/26/2020  Height 5\' 8"  (No Data) 5\' 8"   Weight 254 lbs 3 oz (No Data) 251 lbs 10 oz  BMI 65.78 - 46.96  Systolic 295 (No Data) 284  Diastolic 72 (No Data) 72  Pulse 76 - 63     Physical Exam  He remains morbidly obese but his blood pressure is much improved.  He has not really lost any significant weight.  He is alert and oriented.     Assessment   1. Low testosterone in male   2. Type 2 diabetes mellitus with vascular disease ([place])   3. Essential hypertension   4. Morbid obesity ([place])   5. Mixed hyperlipidemia       Tests ordered Orders Placed This Encounter  Procedures  . Basic metabolic panel  . Hemoglobin A1c     Plan: 1. Continue with testosterone cream as  before. 2. Continue with metformin and Jardiance and we will check an A1c today.  If the A1c is not improving or is getting worse, I will increase NP thyroid to help with insulin resistance. 3. Continue with antihypertensive medications as before. 4. Continue with statin therapy for the time being. 5. I will see him in the middle of August for an annual physical exam.   No orders of the defined types were placed in this encounter.   Doree Albee, MD

## 2021-01-28 LAB — HEMOGLOBIN A1C
Hgb A1c MFr Bld: 7.5 % of total Hgb — ABNORMAL HIGH (ref <5.7)
Mean Plasma Glucose: 169 mg/dL
eAG (mmol/L): 9.3 mmol/L

## 2021-01-28 LAB — BASIC METABOLIC PANEL
BUN: 23 mg/dL (ref 7–25)
CO2: 28 mmol/L (ref 20–32)
Calcium: 9.7 mg/dL (ref 8.6–10.3)
Chloride: 102 mmol/L (ref 98–110)
Creat: 1.18 mg/dL (ref 0.70–1.18)
Glucose, Bld: 162 mg/dL — ABNORMAL HIGH (ref 65–99)
Potassium: 4.7 mmol/L (ref 3.5–5.3)
Sodium: 139 mmol/L (ref 135–146)

## 2021-03-24 ENCOUNTER — Other Ambulatory Visit: Payer: Self-pay | Admitting: Nurse Practitioner

## 2021-03-24 DIAGNOSIS — K219 Gastro-esophageal reflux disease without esophagitis: Secondary | ICD-10-CM

## 2021-03-24 NOTE — Telephone Encounter (Signed)
Patient hasn't been seen since 2020. Please let him know I am sending in a limited 4 month supply of his omeprazole. He will need an OV for additional refills.   Erline Levine, please arrange OV.

## 2021-03-25 ENCOUNTER — Encounter: Payer: Self-pay | Admitting: Gastroenterology

## 2021-03-25 NOTE — Telephone Encounter (Signed)
Spoke to pt.  He was made aware that RX has been sent and will need ov.  Pt voiced understanding.

## 2021-05-10 IMAGING — DX DG HIP (WITH OR WITHOUT PELVIS) 2-3V*R*
3 series · 3 of 3 positions shown · non-contrast
Comparison: None.

CLINICAL DATA: Post right hip total arthroplasty.

EXAM:
DG HIP (WITH OR WITHOUT PELVIS) 2-3V RIGHT

[pelvis ap (1 of 2)]
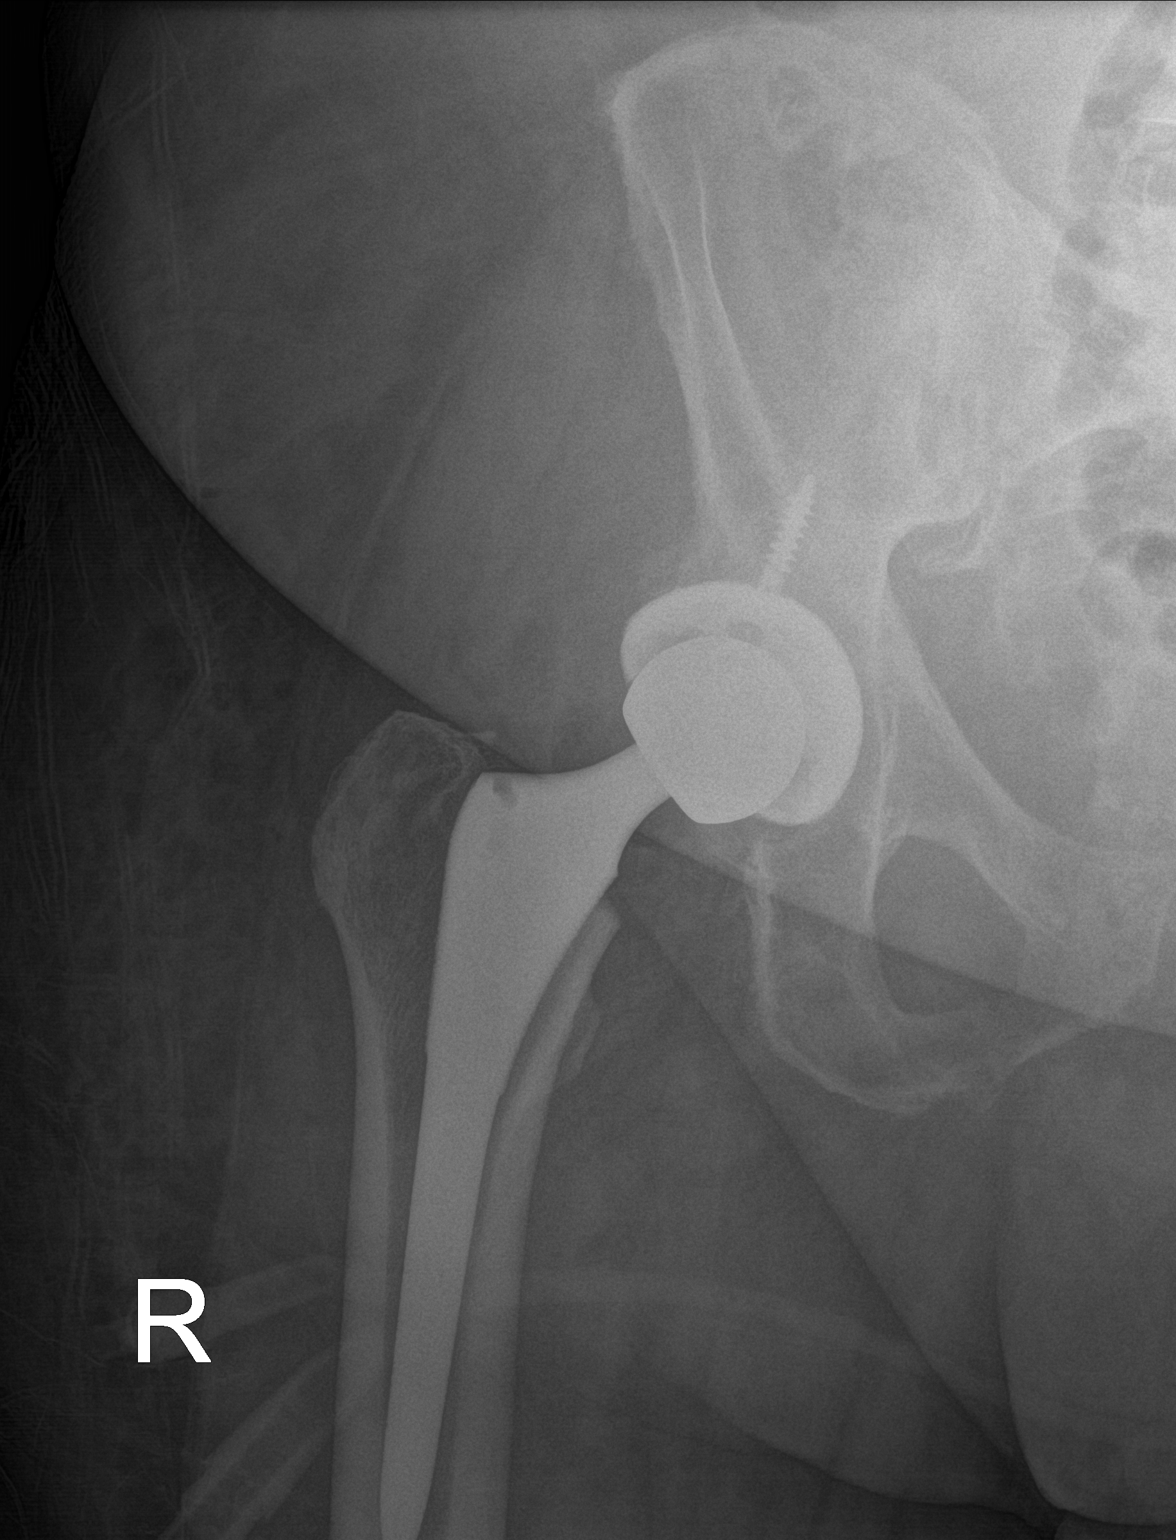

[hip lat]
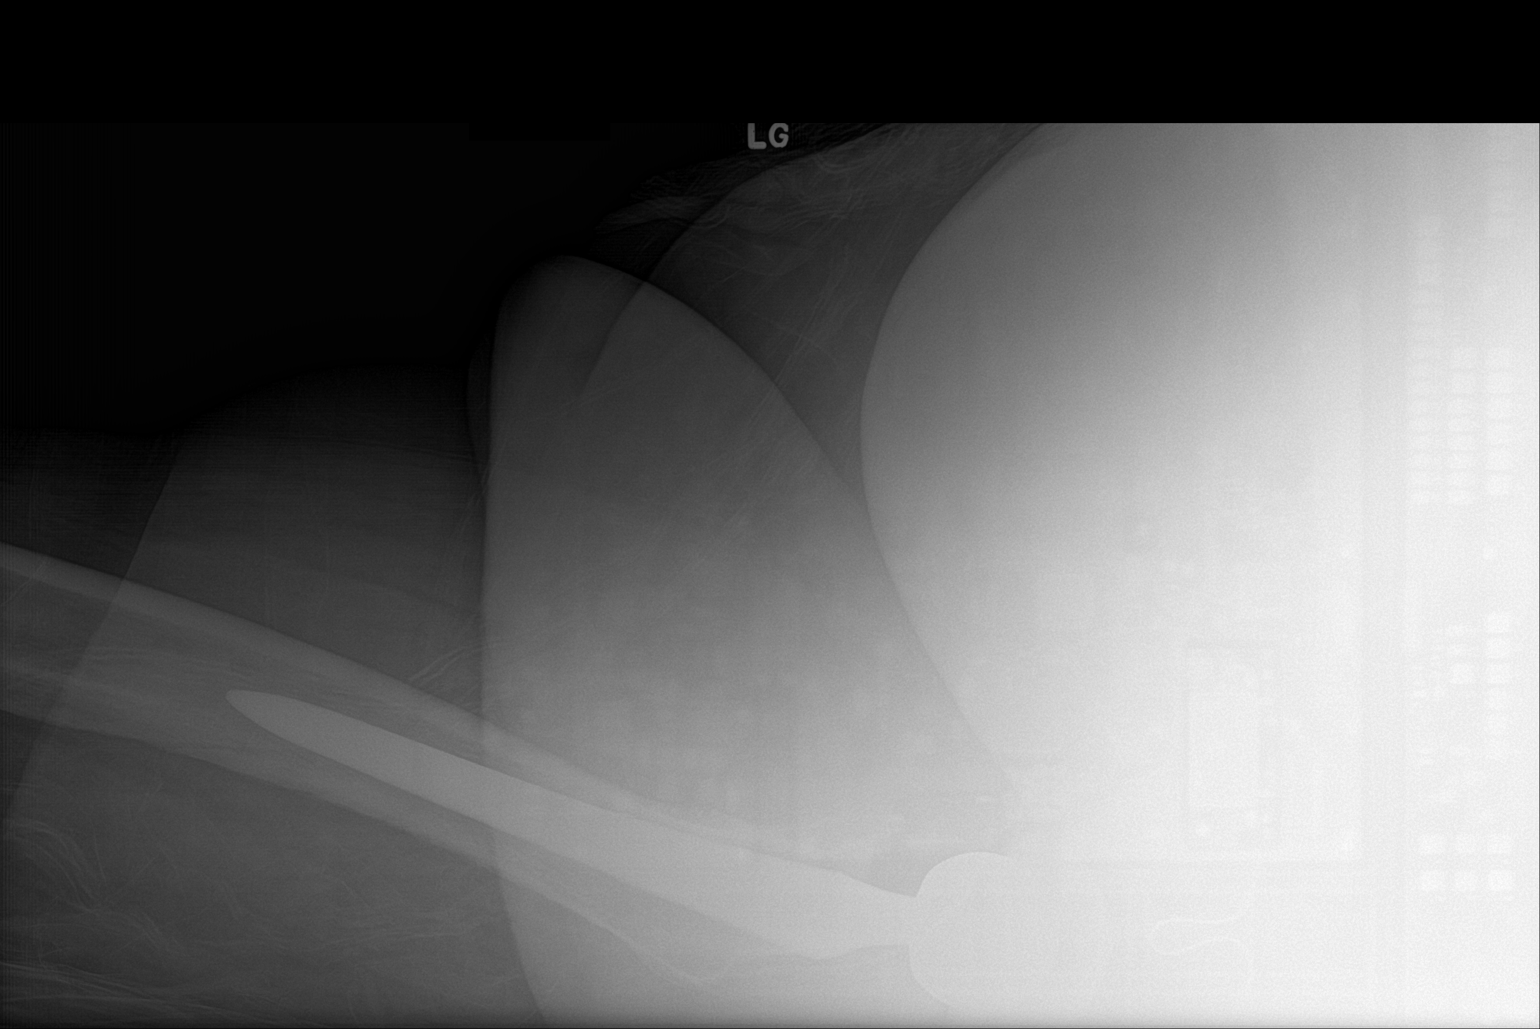

[pelvis ap (2 of 2)]
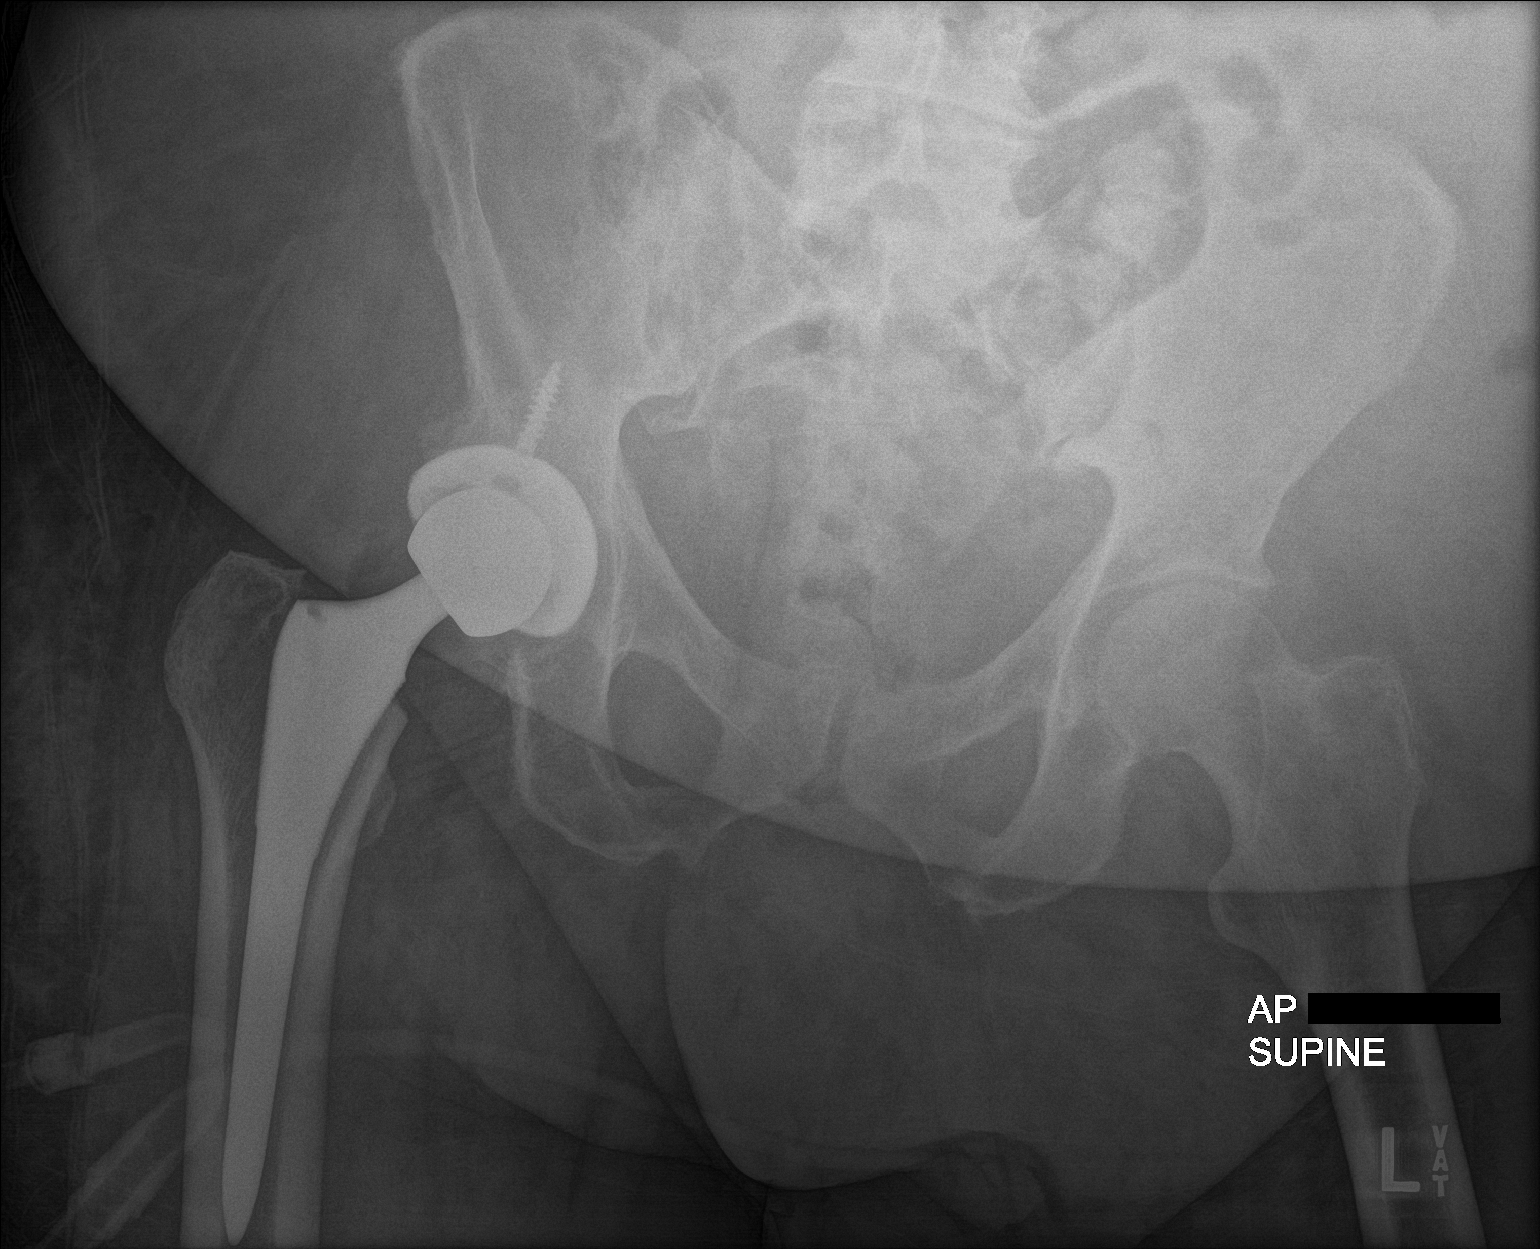

[3 of 3 positions shown; findings below may reference images not displayed]

FINDINGS: Right hip arthroplasty in expected alignment. Femoral stem is
midline. No periprosthetic lucency or fracture. Recent postsurgical
change includes edema in the subcutaneous tissues. Remainder of the
bony pelvis is intact.
IMPRESSION: Right hip arthroplasty in expected alignment without immediate
postoperative complication.

## 2021-05-12 ENCOUNTER — Encounter (INDEPENDENT_AMBULATORY_CARE_PROVIDER_SITE_OTHER): Payer: PPO | Admitting: Nurse Practitioner

## 2021-05-12 ENCOUNTER — Encounter (INDEPENDENT_AMBULATORY_CARE_PROVIDER_SITE_OTHER): Payer: Self-pay

## 2021-05-16 ENCOUNTER — Encounter (INDEPENDENT_AMBULATORY_CARE_PROVIDER_SITE_OTHER): Payer: PPO | Admitting: Internal Medicine

## 2021-07-08 IMAGING — MR MR LUMBAR SPINE W/O CM
4 of 5 series · 23 of 48 positions shown · non-contrast
Comparison: August 15, 2019

CLINICAL DATA: Lower back pain with spasms traveling to the right
buttocks

EXAM:
MRI LUMBAR SPINE WITHOUT CONTRAST
TECHNIQUE: Multiplanar, multisequence MR imaging of the lumbar spine was
performed. No intravenous contrast was administered.

[Series 2: T2 · sagittal · 4.0mm · 0.55mm/px · 6 of 16 slices shown (1 of 2)]
[im 1/16]
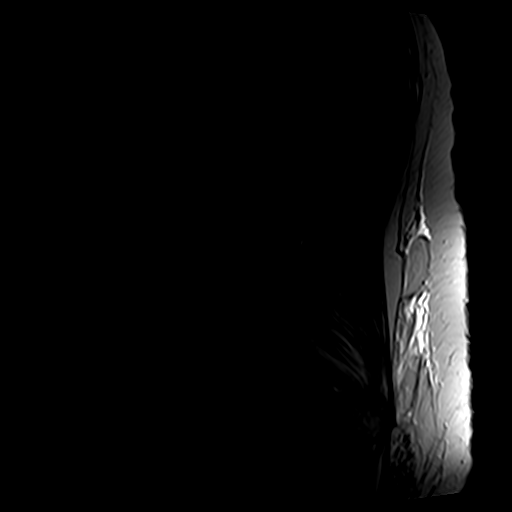
[im 4/16]
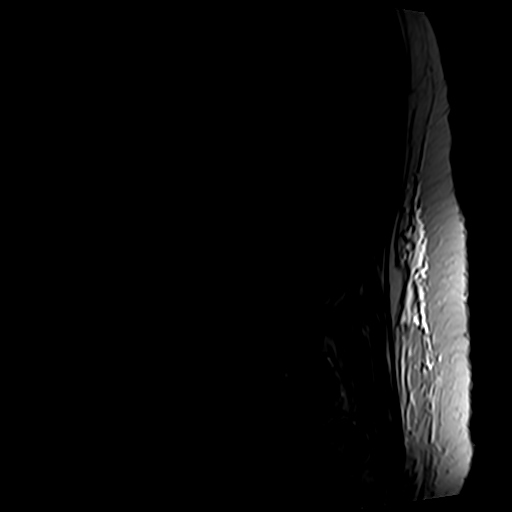
[im 7/16]
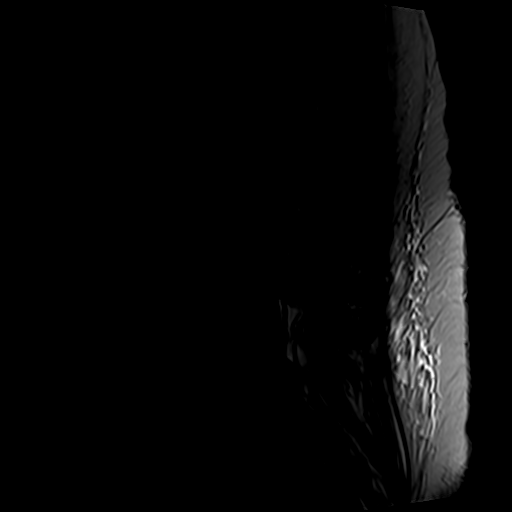
[im 10/16]
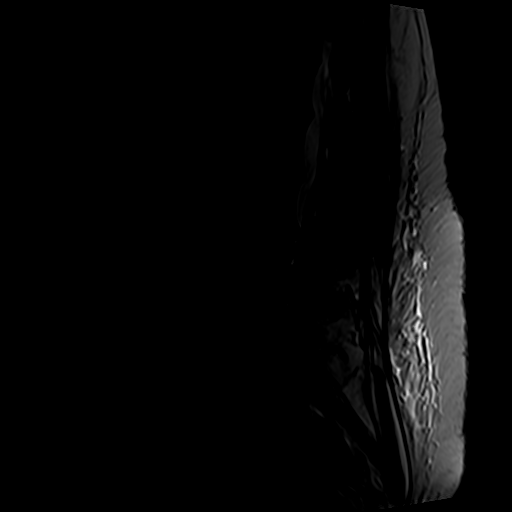
[im 13/16]
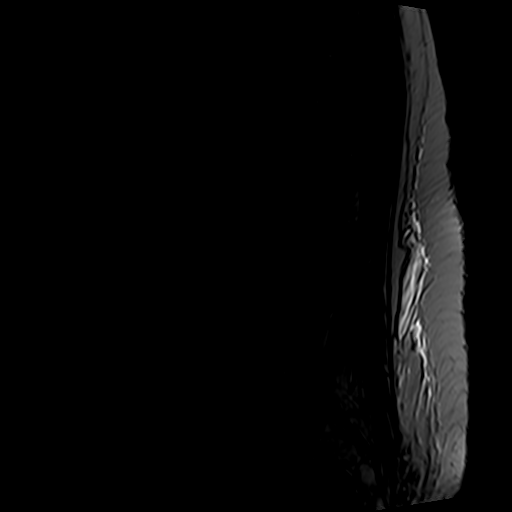
[im 16/16]
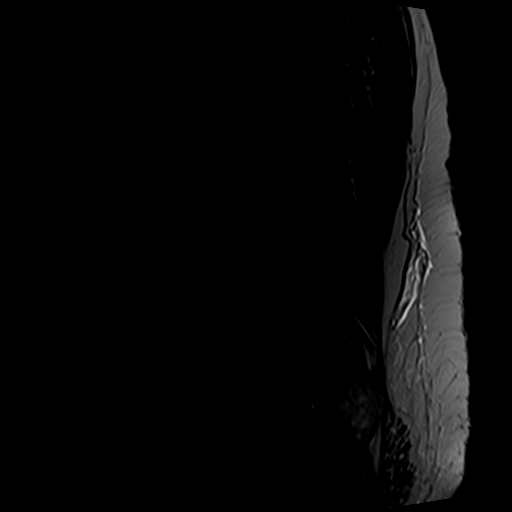

[Series 4: T1 · sagittal · 4.0mm · 0.55mm/px · 6 of 16 slices shown (1 of 2)]
[im 1/16]
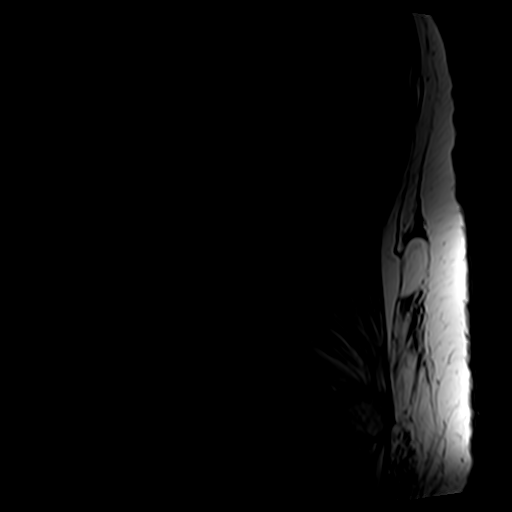
[im 3/16]
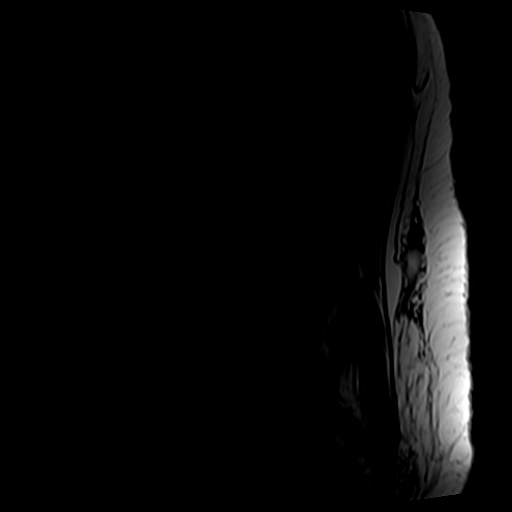
[im 6/16]
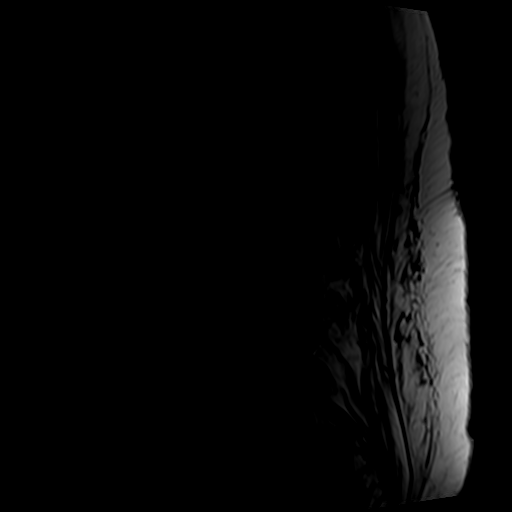
[im 8/16]
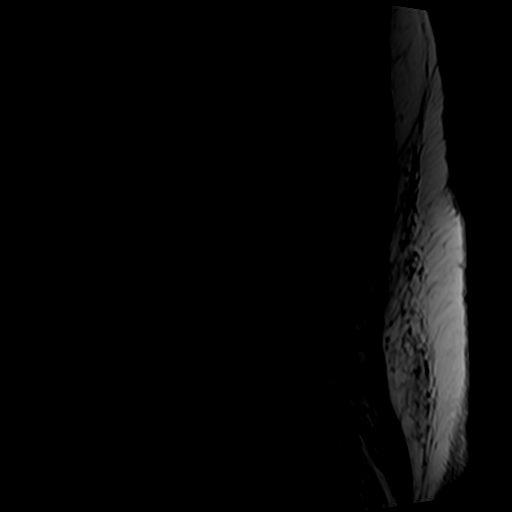
[im 11/16]
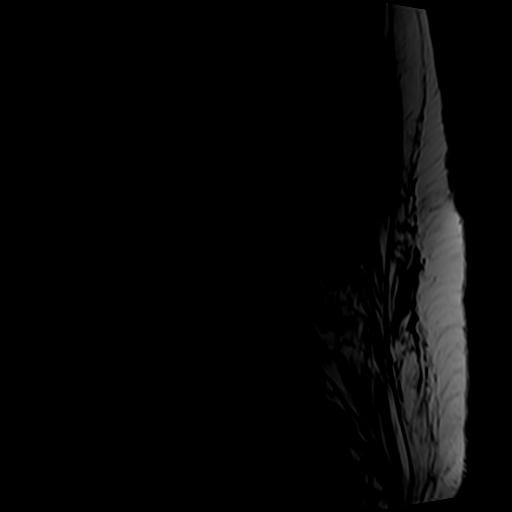
[im 13/16]
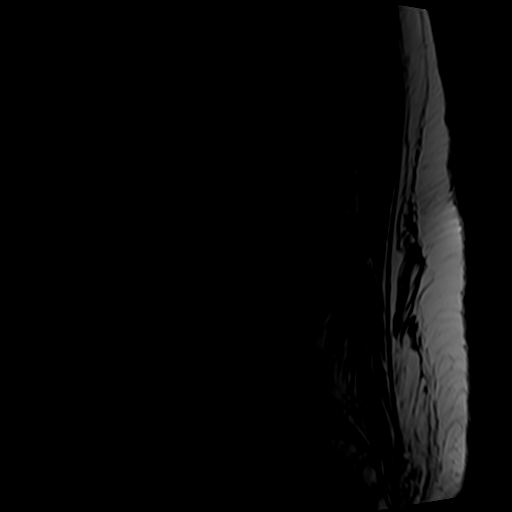

[Series 5: T2 · axial · 4.0mm · 0.70mm/px · z∈[-87,+106]mm · 8 of 35 slices shown (2 of 2)]
[im 1/35]
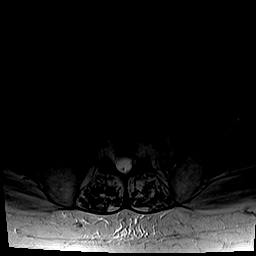
[im 6/35]
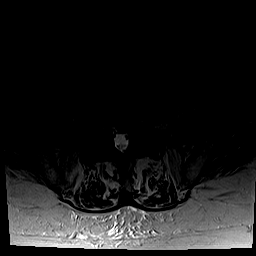
[im 11/35]
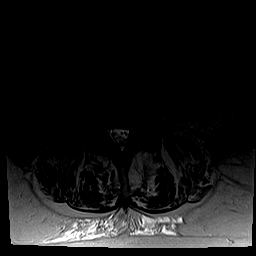
[im 16/35]
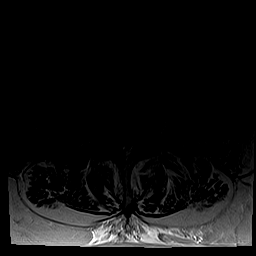
[im 19/35]
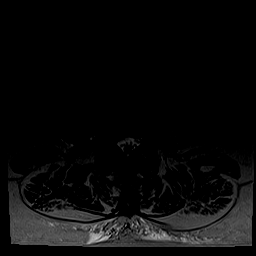
[im 24/35]
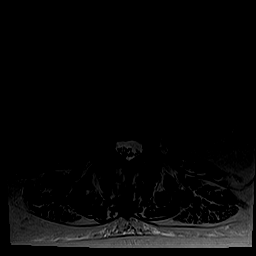
[im 29/35]
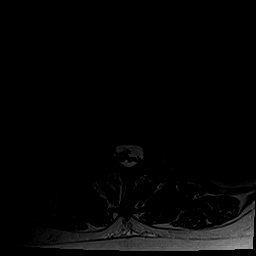
[im 35/35]
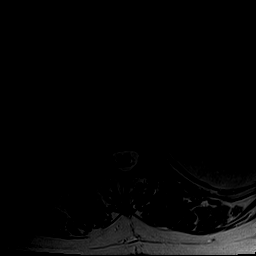

[Series 6: T1 · axial · 4.0mm · 0.35mm/px · z∈[-61,+75]mm · 3 of 35 slices shown (2 of 2)]
[im 6/35]
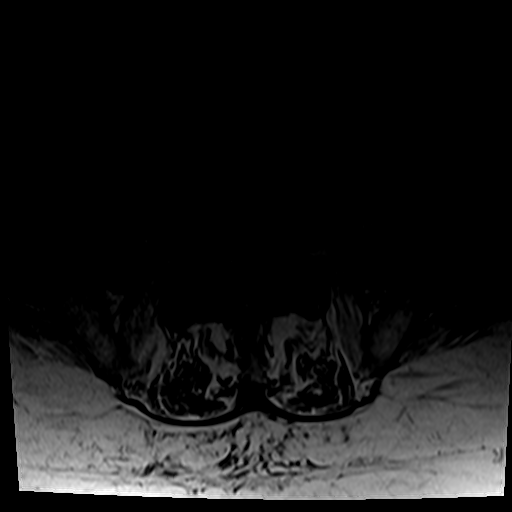
[im 19/35]
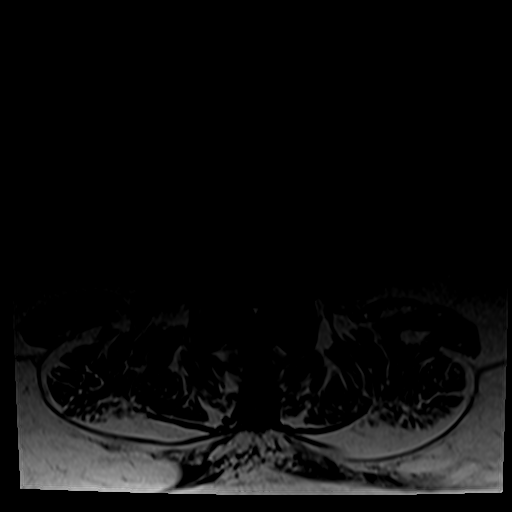
[im 29/35]
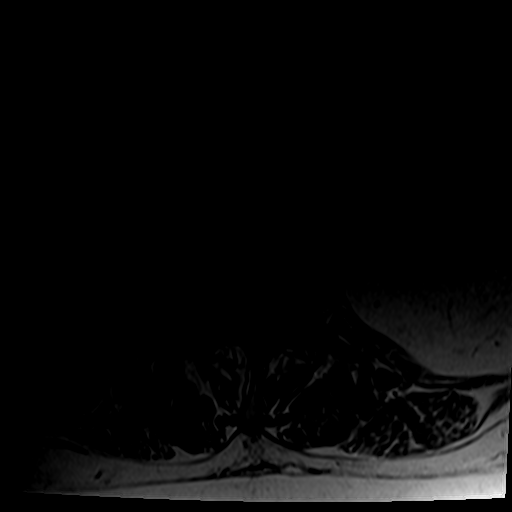

[23 of 48 positions shown; findings below may reference images not displayed]

FINDINGS: Segmentation: There are 5 non-rib bearing lumbar type vertebral
bodies with the last intervertebral disc space labeled as L5-S1.

Alignment: Mild leftward curvature of the lumbar spine. There is a
minimal retrolisthesis L2 on L3 and L3 on L4.

Vertebrae: The vertebral body heights are well maintained. No
fracture, marrow edema,or pathologic marrow infiltration. Fatty
endplate changes are noted at L2-L3 and L4-L5.

Conus medullaris and cauda equina: Conus extends to the L1 level.
Conus and cauda equina appear normal.

Paraspinal and other soft tissues: The paraspinal soft tissues and
visualized retroperitoneal structures are unremarkable. The
sacroiliac joints are intact. Mild bilateral renal atrophy seen.
Mild fatty atrophy of the paraspinal musculature is seen.

Disc levels:

T12-L1:  No significant canal or neural foraminal narrowing.

L1-L2: There is a broad-based disc bulge which is slightly
asymmetric to the right with facet arthrosis and ligamentum flavum
hypertrophy which causes moderate bilateral neural foraminal
narrowing, right greater than left. There is mild effacement
anterior thecal sac.

L2-L3: There is a broad-based disc bulge with a right paracentral
disc protrusion, ligamentum flavum hypertrophy and facet arthrosis.
Moderate to severe right and moderate left neural foraminal
narrowing is seen. There is mild effacement anterior thecal sac.

L3-L4: There is a broad-based disc bulge with a right lateral recess
disc extrusion with a heterogeneous soft tissue density measuring
approximately 1 cm in AP dimension, likely sequestered disc which
contacts and impinges the descending bilateral L4 nerve roots. There
is severe bilateral neural foraminal narrowing and the central
thecal sac is effaced measuring 3 mm in AP diameter at this level.

L4-L5: There is a broad-based disc bulge with a central disc
protrusion which contacts the descending left L5 nerve root. There
is severe bilateral neural foraminal narrowing and mild central
canal stenosis.

L5-S1: There is a broad-based disc bulge with facet arthrosis and
ligamentum flavum hypertrophy. There is moderate to severe left and
moderate right neural foraminal narrowing.
IMPRESSION: 1. Mild levoconvex scoliotic curvature of the lumbar spine. Minimal
retrolisthesis of L2 on L3 and L3 on L4.
2. Lumbar spine spondylosis most notable at L3-L4 with a right
lateral recess disc extrusion with a probable adjacent 1 cm
sequestered disc which contacts and impinges the descending
bilateral L4 nerve roots as well as causes severe bilateral neural
foraminal narrowing and severe central canal stenosis.
These results will be called to the ordering clinician or
representative by the Radiologist Assistant, and communication
documented in the PACS or zVision Dashboard.

## 2021-08-09 ENCOUNTER — Other Ambulatory Visit: Payer: Self-pay | Admitting: Gastroenterology

## 2021-08-09 DIAGNOSIS — K219 Gastro-esophageal reflux disease without esophagitis: Secondary | ICD-10-CM

## 2021-08-09 NOTE — Telephone Encounter (Signed)
Refilling but has not been seen in over 2 years. Needs visit. Can be virtual if patient desires.

## 2021-08-10 ENCOUNTER — Encounter: Payer: Self-pay | Admitting: Gastroenterology

## 2021-09-15 ENCOUNTER — Ambulatory Visit: Payer: PPO | Admitting: Nurse Practitioner

## 2021-09-19 ENCOUNTER — Ambulatory Visit: Payer: PPO | Admitting: Nurse Practitioner

## 2021-10-14 ENCOUNTER — Ambulatory Visit: Payer: PPO | Admitting: Cardiology

## 2021-10-14 ENCOUNTER — Encounter: Payer: Self-pay | Admitting: Cardiology

## 2021-10-14 ENCOUNTER — Other Ambulatory Visit: Payer: Self-pay

## 2021-10-14 VITALS — BP 118/64 | HR 82 | Temp 97.7°F | Resp 16 | Ht 68.0 in | Wt 259.8 lb

## 2021-10-14 DIAGNOSIS — I25118 Atherosclerotic heart disease of native coronary artery with other forms of angina pectoris: Secondary | ICD-10-CM

## 2021-10-14 DIAGNOSIS — I10 Essential (primary) hypertension: Secondary | ICD-10-CM

## 2021-10-14 DIAGNOSIS — E7800 Pure hypercholesterolemia, unspecified: Secondary | ICD-10-CM

## 2021-10-14 MED ORDER — ROSUVASTATIN CALCIUM 20 MG PO TABS: 20.0000 mg | ORAL_TABLET | Freq: Every day | ORAL | 3 refills | Status: DC

## 2021-10-14 NOTE — Progress Notes (Signed)
Primary Physician/Referring:  Renee Rival, FNP  Patient ID: Omar [name], male    DOB: 1946/02/22, 76 y.o.   MRN: [id number]  Chief Complaint  Patient presents with   Coronary Artery Disease   Hypertension   Hyperlipidemia   Follow-up    1 year   HPI:    Omar [name]  is a 76 y.o.  Caucasian male who is fairly active, has history of known coronary artery disease and had undergone angioplasty to his ramus intermediate branch with drug-eluting stent  Promus stent  on 06/04/2012., here for routine anual CAD f/u.  Wife at bedside. His PMD is significant for DM, hypertension, mixed hyperlipidemia, PAD and CAD.  He has had 2 episodes of DVT 1 after long travel in 2016 and the second 1 spontaneously and since then has been on chronic anticoagulation with Eliquis.  He has lost about 30 to 40 pounds in weight overall and has maintained weight loss over the past 1 year, he has not had any further episodes of leg edema.  Except for chronic mild dyspnea on exertion, he remains asymptomatic.  He has not had any chest pain, no palpitations or dizziness or syncope or leg edema.  Denies symptoms of claudication.   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 Merlene Laughter interpreted the study-CPAP recommended, but refused by patient   Stroke [facility])    no deficits from stroke, "Didnt know I had one when they say I did".   Past Surgical History:  Procedure Laterality Date   BIOPSY  04/16/2019   Procedure: BIOPSY;  Surgeon: Danie Binder, MD;  [facility];  Service: Endoscopy;;   CATARACT EXTRACTION W/PHACO Left 04/03/2016   Procedure: CATARACT EXTRACTION PHACO AND INTRAOCULAR LENS PLACEMENT LEFT EYE CDE=21.76;  Surgeon: Williams Che, MD;  Location: [facility];  Service: Ophthalmology;  Laterality: Left;   CATARACT EXTRACTION W/PHACO Right 06/19/2016   Procedure: CATARACT EXTRACTION PHACO AND INTRAOCULAR LENS PLACEMENT; CDE:  11.56;  Surgeon: Williams Che, MD;  Location: [facility];  Service: Ophthalmology;  Laterality: Right;   COLONOSCOPY N/A 04/16/2019   Procedure: COLONOSCOPY;  Surgeon: Danie Binder, MD;  [facility];  Service: Endoscopy;  Laterality: N/A;  8:30am   COLONOSCOPY W/ POLYPECTOMY  05/2008   polypectomy x4-adenomatous; internal hemorrhoids   CORONARY ANGIOPLASTY WITH STENT PLACEMENT  06/04/2012     DES    to [place]     x 1   DUPUYTREN / PALMAR FASCIOTOMY  2009   rt hand-dsc-went home same day x2   ESOPHAGOGASTRODUODENOSCOPY N/A 04/16/2019  Procedure: ESOPHAGOGASTRODUODENOSCOPY (EGD);  Surgeon: Danie Binder, MD;  [facility];  Service: Endoscopy;  Laterality: N/A;   LEFT HEART CATHETERIZATION WITH CORONARY ANGIOGRAM N/A 05/28/2012   Procedure: LEFT HEART CATHETERIZATION WITH CORONARY ANGIOGRAM;  Surgeon: Laverda Page, MD;  Location: [facility] CATH LAB;  Service: Cardiovascular;  Laterality: N/A;   PERCUTANEOUS CORONARY STENT INTERVENTION (PCI-S) N/A 06/04/2012   Procedure: PERCUTANEOUS CORONARY STENT INTERVENTION (PCI-S);  Surgeon: Laverda Page, MD;  Location: [facility] CATH LAB;  Service: Cardiovascular;  Laterality: N/A;   POLYPECTOMY  04/16/2019    Procedure: POLYPECTOMY;  Surgeon: Danie Binder, MD;  [facility];  Service: Endoscopy;;   TONSILLECTOMY     TOTAL HIP ARTHROPLASTY Right 06/24/2019   Procedure: TOTAL HIP ARTHROPLASTY;  Surgeon: Marchia Bond, MD;  Location: [facility];  Service: Orthopedics;  Laterality: Right;   VENTRAL HERNIA REPAIR  2010   umb hernia-[facility]-went home same day    Social History   Tobacco Use   Smoking status: Never   Smokeless tobacco: Never  Substance Use Topics   Alcohol use: No   Marital Status: Married   ROS  Review of Systems  Cardiovascular:  Positive for dyspnea on exertion (stable). Negative for chest pain and leg swelling.  Gastrointestinal:  Negative for melena.  Objective   Vitals with BMI 10/14/2021 01/27/2021 11/15/2020  Height 5' 8"  5' 8"  (No Data)  Weight 259 lbs 13 oz 254 lbs 3 oz (No Data)  BMI 79.89 21.19 -  Systolic 417 408 (No Data)  Diastolic 64 72 (No Data)  Pulse 82 76 -    Physical Exam Constitutional:      Appearance: He is well-developed.     Comments: Morbidly obese  Eyes:     Conjunctiva/sclera: Conjunctivae normal.  Neck:     Thyroid: No thyromegaly.     Comments: Short neck and difficult to evaluate JVP Cardiovascular:     Rate and Rhythm: Normal rate and regular rhythm.     Pulses:          Carotid pulses are 2+ on the right side and 2+ on the left side.      Popliteal pulses are 2+ on the right side and 2+ on the left side.       Dorsalis pedis pulses are 0 on the right side and 0 on the left side.       Posterior tibial pulses are 0 on the right side and 2+ on the left side.     Heart sounds: Normal heart sounds. No murmur heard.   No gallop.     Comments: Femoral  pulse difficult to feel due to patient's body habitus.   2+ Pitting ankle edema bilateral. Chronic venostasis skin change noted.  Pulmonary:     Effort: Pulmonary effort is normal.     Breath sounds: Normal breath sounds.  Abdominal:     General: Bowel sounds are normal.      Palpations: Abdomen is soft.     Comments: Obese. Pannus present  Musculoskeletal:     Right lower leg: No edema.     Left lower leg: No edema.  Skin:    Capillary Refill: Capillary refill takes less than 2 seconds.   Laboratory examination:    Recent Labs    10/26/20 1035 01/27/21 0855  NA 139 139  K 4.7 4.7  CL 101 102  CO2 26 28  GLUCOSE 170* 162*  BUN 25 23  CREATININE 1.30* 1.18  CALCIUM 9.9 9.7  GFRNONAA 53*  --   GFRAA 61  --    CrCl cannot be calculated (Patient's most recent lab result is older than the maximum 21 days allowed.).  CMP Latest Ref Rng & Units 01/27/2021 10/26/2020 05/18/2020  Glucose 65 - 99 mg/dL 162(H) 170(H) 159(H)  BUN 7 - 25 mg/dL 23 25 16   Creatinine 0.70 - 1.18 mg/dL 1.18 1.30(H) 1.13  Sodium 135 - 146 mmol/L 139 139 139  Potassium 3.5 - 5.3 mmol/L 4.7 4.7 4.1  Chloride 98 - 110 mmol/L 102 101 101  CO2 20 - 32 mmol/L 28 26 28   Calcium 8.6 - 10.3 mg/dL 9.7 9.9 9.7  Total Protein 6.1 - 8.1 g/dL - 6.3 6.3  Total Bilirubin 0.2 - 1.2 mg/dL - 0.6 0.5  Alkaline Phos 40 - 115 U/L - - -  AST 10 - 35 U/L - 16 12  ALT 9 - 46 U/L - 14 10   CBC Latest Ref Rng & Units 05/18/2020 06/25/2019 06/18/2019  WBC 3.8 - 10.8 Thousand/uL 8.2 12.6(H) 8.7  Hemoglobin 13.2 - 17.1 g/dL 15.5 10.7(L) 14.6  Hematocrit 38.5 - 50.0 % 46.4 33.2(L) 43.5  Platelets 140 - 400 Thousand/uL 286 237 300   Lipid Panel Recent Labs    10/26/20 1035  CHOL 161  TRIG 163*  LDLCALC 86  HDL 49  CHOLHDL 3.3     HEMOGLOBIN A1C Lab Results  Component Value Date   HGBA1C 7.5 (H) 01/27/2021   MPG 169 01/27/2021   TSH Recent Labs    10/26/20 1035  TSH 0.62   09/09/2021:  TSH normal.  Hb 15.7/HCT 45.3, platelets 262.  Serum creatinine 1.250.  BUN 22.  Serum glucose 181.  Potassium 4.3.  LFTs normal.  eGFR 59 mL.  Total cholesterol 170, triglycerides 137, HDL 58, LDL 85.  Medications and allergies  No Known Allergies   Current Outpatient Medications on File  Prior to Visit  Medication Sig Dispense Refill   acebutolol (SECTRAL) 200 MG capsule TAKE (1) CAPSULE BY MOUTH TWICE DAILY. 60 capsule 9   apixaban (ELIQUIS) 5 MG TABS tablet Take 5 mg by mouth 2 (two) times daily.     Calcium-Vitamin D-Vitamin K 650-12.5-40 MG-MCG-MCG CHEW Chew 2 tablets by mouth at bedtime.     cetirizine (ZYRTEC ALLERGY) 10 MG tablet Take 1 tablet (10 mg total) by mouth daily. 30 tablet 0   empagliflozin (JARDIANCE) 25 MG TABS tablet Take 12.5 mg by mouth daily.     fluticasone (FLONASE) 50 MCG/ACT nasal spray Place 1 spray into both nostrils daily for 14 days. 16 g 0   gabapentin (NEURONTIN) 100 MG capsule TAKE (1) CAPSULE BY MOUTH TWICE DAILY. (Patient taking differently: Take 100 mg by mouth 2 (two) times daily.) 60 capsule 3   lisinopril-hydrochlorothiazide (PRINZIDE,ZESTORETIC) 20-25 MG per tablet Take 1 tablet by mouth daily.     metFORMIN (GLUCOPHAGE) 1000 MG tablet TAKE (1) TABLET BY MOUTH TWICE DAILY. (Patient taking differently: Take 1,000 mg by mouth 2 (two) times a day.) 60 tablet 3   Multiple Vitamins-Minerals (ONE-A-DAY 50 PLUS PO) Take 1 tablet by mouth at bedtime.      nitroGLYCERIN (NITROSTAT) 0.4 MG SL tablet PLACE 1 TAB UNDER TONGUE EVERY 5 MIN IF NEEDED FOR CHEST PAIN. MAY USE 3 TIMES.NO RELIEF CALL 911. 25 tablet 0   omeprazole (PRILOSEC) 20 MG capsule TAKE 1 CAPSULE 30 MINUTES BEFORE BREAKFAST. 90 capsule 0   spironolactone (ALDACTONE) 50 MG tablet Take 1  tablet (50 mg total) by mouth every morning. 90 tablet 1   tamsulosin (FLOMAX) 0.4 MG CAPS capsule Take 0.4 mg by mouth daily.     thyroid (NP THYROID) 60 MG tablet Take 1 tablet (60 mg total) by mouth daily before breakfast. 30 tablet 3   torsemide (DEMADEX) 20 MG tablet Take 20 mg by mouth daily as needed (swelling).      traZODone (DESYREL) 100 MG tablet Take 100 mg by mouth at bedtime.     verapamil (CALAN-SR) 240 MG CR tablet Take 1 tablet (240 mg total) by mouth at bedtime. 90  tablet 0   vitamin B-12 (CYANOCOBALAMIN) 500 MCG tablet Take 500 mcg by mouth daily.     Vitamin D, Ergocalciferol, (DRISDOL) 1.25 MG (50000 UT) CAPS capsule TAKE 1 CAPSULE BY MOUTH ONCE A WEEK. 12 capsule 0   No current facility-administered medications on file prior to visit.     Radiology:  No results found. Cardiac Studies:   Heart Cath 06/04/12: Ostial Ramus intermediate 2.75x12 Promus element DES placed. Has anamolous circumflex coronary artery from right coronary cusp.  Outside echo 02/23/12: Normal LVEF, 55%. Cannot exclude wall motion abnormality. Mild aortic calcification. No aortic stenosis.   Carotid artery duplex 07/2016: Mild bilateral common carotid, bifurcation, proximal ICA stenosis with mild diffuse plaque. Antegrade vertebral flow. No significant change from Carotid artery duplex 06/19/13  Lexiscan Myoview Stress Test 04/07/2019: Lexiscan stress test was performed. Stress EKG is non-diagnostic, as this is pharmacological stress test. There was baseline artifact as well making EKG difficult to interpret.  The perfusion imaging study demonstrates diaphragmatic attenuation artifact in the inferior wall without reversible ischemia or scar.  LV systolic function was normal without wall motion abnormality and calculated at 70%. This is a low risk study.   EKG:  EKG 10/14/2021: Sinus rhythm with first-degree AV block at rate of 55 bpm, left axis deviation, left anterior fascicular block.  Incomplete right bundle branch block.  Poor R wave progression.  Low-voltage complexes.  Consider pulmonary disease pattern. No significant change from 10/11/2020.  Assessment     ICD-10-CM   1. Coronary artery disease of native artery of native heart with stable angina pectoris (HCC)  I25.118 EKG 12-Lead    rosuvastatin (CRESTOR) 20 MG tablet    2. Essential hypertension  I10     3. Hypercholesteremia  E78.00 rosuvastatin (CRESTOR) 20 MG tablet      Meds ordered this encounter   Medications   rosuvastatin (CRESTOR) 20 MG tablet    Sig: Take 1 tablet (20 mg total) by mouth daily.    Dispense:  90 tablet    Refill:  3    Discontinue Pravastatin, Efficacy   Medications Discontinued During This Encounter  Medication Reason   Testosterone 20 % CREA    pravastatin (PRAVACHOL) 40 MG tablet Change in therapy     Recommendations:    ROSHUN KLINGENSMITH is a 76 y.o. Caucasian male who is fairly active, has history of known coronary artery disease and had undergone angioplasty to his ramus intermediate branch with drug-eluting stent  Promus stent  on 06/04/2012., here for routine anual CAD f/u.  Wife at bedside. His PMD is significant for DM, hypertension, mixed hyperlipidemia, PAD and CAD.  He has had 2 episodes of DVT 1 after long travel in 2016 and the second 1 spontaneously and since then has been on chronic anticoagulation with Eliquis.  He has lost about 30 to 40 pounds in weight overall and has  maintained weight loss over the past 1 year, he has not had any further episodes of leg edema.  Except for chronic mild dyspnea on exertion, he remains asymptomatic.  Reviewed his external labs, LDL is not at goal.  We will discontinue pravastatin and switch him to Crestor 20 mg daily.  He is establishing with new PCP, I updated the labs from [place] [facility] from December 2022, he could get repeat labs probably in 3 to 6 months for follow-up of hyperlipidemia and also he does need continued follow-up of diabetes mellitus.  Blood pressures well controlled.  Overall pleased with his progress, encouraged him to continue to remain active and again lose weight.  I will see him back in a year.    Adrian Prows, MD, [facility] 10/14/2021, 1:44 PM Office: [phone] Pager: [phone]

## 2021-10-27 ENCOUNTER — Encounter: Payer: Self-pay | Admitting: Cardiology

## 2021-10-27 ENCOUNTER — Other Ambulatory Visit: Payer: Self-pay

## 2021-10-27 DIAGNOSIS — E7800 Pure hypercholesterolemia, unspecified: Secondary | ICD-10-CM

## 2021-10-27 DIAGNOSIS — I25118 Atherosclerotic heart disease of native coronary artery with other forms of angina pectoris: Secondary | ICD-10-CM

## 2021-10-27 DIAGNOSIS — I10 Essential (primary) hypertension: Secondary | ICD-10-CM

## 2021-10-27 MED ORDER — ACEBUTOLOL HCL 200 MG PO CAPS: ORAL_CAPSULE | ORAL | 3 refills | Status: DC

## 2021-10-27 MED ORDER — ROSUVASTATIN CALCIUM 20 MG PO TABS: 20.0000 mg | ORAL_TABLET | Freq: Every day | ORAL | 3 refills | Status: DC

## 2021-10-28 ENCOUNTER — Encounter: Payer: Self-pay | Admitting: Cardiology

## 2021-10-28 NOTE — Telephone Encounter (Signed)
From patient.

## 2021-11-01 ENCOUNTER — Ambulatory Visit (INDEPENDENT_AMBULATORY_CARE_PROVIDER_SITE_OTHER): Payer: PPO | Admitting: Nurse Practitioner

## 2021-11-01 ENCOUNTER — Other Ambulatory Visit: Payer: Self-pay

## 2021-11-01 ENCOUNTER — Encounter: Payer: Self-pay | Admitting: Nurse Practitioner

## 2021-11-01 VITALS — BP 112/68 | HR 74 | Ht 68.0 in | Wt 257.1 lb

## 2021-11-01 DIAGNOSIS — I10 Essential (primary) hypertension: Secondary | ICD-10-CM

## 2021-11-01 DIAGNOSIS — I82401 Acute embolism and thrombosis of unspecified deep veins of right lower extremity: Secondary | ICD-10-CM

## 2021-11-01 DIAGNOSIS — E782 Mixed hyperlipidemia: Secondary | ICD-10-CM

## 2021-11-01 DIAGNOSIS — I25118 Atherosclerotic heart disease of native coronary artery with other forms of angina pectoris: Secondary | ICD-10-CM

## 2021-11-01 DIAGNOSIS — E1159 Type 2 diabetes mellitus with other circulatory complications: Secondary | ICD-10-CM

## 2021-11-01 DIAGNOSIS — E559 Vitamin D deficiency, unspecified: Secondary | ICD-10-CM

## 2021-11-01 DIAGNOSIS — G4700 Insomnia, unspecified: Secondary | ICD-10-CM

## 2021-11-01 NOTE — Assessment & Plan Note (Signed)
Pt states that he would like to come off trazodone, he stated  that melatonin otc helps him sleep

## 2021-11-01 NOTE — Assessment & Plan Note (Signed)
Takes Jardiance 12.5 mg daily, metformin 1000 mg 2 times daily.  Check A1c today Lab Results  Component Value Date   HGBA1C 7.5 (H) 01/27/2021

## 2021-11-01 NOTE — Assessment & Plan Note (Signed)
Currently taking pravastatin will switch to Crestor 20 mg once he gets med from the [place]

## 2021-11-01 NOTE — Patient Instructions (Addendum)
Please get your Covid vaccine at your pharmacy.  Please get your fasting labs done tomorrow.    It is important that you exercise regularly at least 30 minutes 5 times a week.  Think about what you will eat, plan ahead. Choose " clean, green, fresh or frozen" over canned, processed or packaged foods which are more sugary, salty and fatty. 70 to 75% of food eaten should be vegetables and fruit. Three meals at set times with snacks allowed between meals, but they must be fruit or vegetables. Aim to eat over a 12 hour period , example 7 am to 7 pm, and STOP after  your last meal of the day. Drink water,generally about 64 ounces per day, no other drink is as healthy. Fruit juice is best enjoyed in a healthy way, by EATING the fruit.  Thanks for choosing [facility], we consider it a privelige to serve you.

## 2021-11-01 NOTE — Assessment & Plan Note (Addendum)
DASH diet and commitment to daily physical activity for a minimum of 30 minutes discussed and encouraged, as a part of hypertension management. The importance of attaining a healthy weight is also discussed.  BP/Weight 11/01/2021 10/14/2021 01/27/2021 11/15/2020 10/26/2020 10/11/2020 82/50/0370  Systolic BP 488 891 694 - 503 888 280  Diastolic BP 68 64 72 - 72 80 82  Wt. (Lbs) 257.12 259.8 254.2 - 251.6 255 258.8  BMI 39.09 39.5 38.65 - 38.26 38.77 39.35   Takes lisinopril with hydrochlorothiazide 20- 25 mg tablet daily spironolactone 50 mg daily spironolactone 50 mg daily.  Acebutolol 200 mg. VERAPAMIL 240MG daily eGFR today

## 2021-11-01 NOTE — Assessment & Plan Note (Signed)
Takes vitamin D 10,000 units daily. Check vitamin D levels today

## 2021-11-01 NOTE — Progress Notes (Signed)
New Patient Office Visit  Subjective:  Patient ID: Omar [name], male    DOB: 1946-05-09  Age: 76 y.o. MRN: [id number]  CC:  Chief Complaint  Patient presents with   New Patient (Initial Visit)    NP    HPI Omar [name] presents for establish care.  Last PCP Gorsani.   HTN; ACEBUTOLOL 200mg  BID, lisinopril-hydrochlorothiazide 20-25mg , sprinolactone 50 mg daily, verapamil 240mg   DM. Jardiuance 12.5mg  daily , metformin 1000mg  BID Allergies. Ceirizine 10mg  daily, flonase nasal spray PRN.  GERD. Prilosec 20mg  prn HLD. Rosuvastatin 20mg  , he still taking pravastatin till he gets crestor from the [place].  BPH. Tamsulosin 0.4mg   Insomnia. Dc trazodone 100mg  . States that melatonin  helps him sleep   Pt told to Stop taking NP thyroid , he has no history of hypothyroidism stated that he was taking the medication to increase his energy level.   Patient stated that he would like to come off gabapentin sent as he no longer needs the med.   He has a cyst removal scheduled for Feb Dermatology - [facility]-Dr. Einar Gip.  Has an upcoming diabetic eye exam .   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 Merlene Laughter interpreted the study-CPAP recommended, but  refused by patient   Stroke [facility])    no deficits from stroke, "Didnt know I had one when they say I did".    Past Surgical History:  Procedure Laterality Date   BIOPSY  04/16/2019   Procedure: BIOPSY;  Surgeon: Danie Binder, MD;  [facility];  Service: Endoscopy;;   CATARACT EXTRACTION W/PHACO Left 04/03/2016   Procedure: CATARACT EXTRACTION PHACO AND INTRAOCULAR LENS PLACEMENT LEFT EYE CDE=21.76;  Surgeon: Williams Che, MD;  Location: [facility] ORS;  Service: Ophthalmology;  Laterality: Left;   CATARACT EXTRACTION W/PHACO Right 06/19/2016   Procedure: CATARACT EXTRACTION PHACO AND INTRAOCULAR LENS PLACEMENT; CDE:  11.56;  Surgeon: Williams Che, MD;  Location: [facility];  Service: Ophthalmology;  Laterality: Right;   COLONOSCOPY N/A 04/16/2019   Procedure: COLONOSCOPY;  Surgeon: Danie Binder, MD;  [facility];  Service: Endoscopy;  Laterality: N/A;  8:30am   COLONOSCOPY W/ POLYPECTOMY  05/2008   polypectomy x4-adenomatous; internal hemorrhoids   CORONARY ANGIOPLASTY WITH STENT PLACEMENT  06/04/2012     DES    to [place]     x 1   DUPUYTREN / PALMAR FASCIOTOMY  2009   rt hand-dsc-went home same day x2   ESOPHAGOGASTRODUODENOSCOPY N/A 04/16/2019   Procedure: ESOPHAGOGASTRODUODENOSCOPY (EGD);  Surgeon: Danie Binder, MD;  [facility];  Service: Endoscopy;  Laterality: N/A;   LEFT HEART CATHETERIZATION WITH CORONARY ANGIOGRAM N/A 05/28/2012   Procedure: LEFT HEART CATHETERIZATION WITH CORONARY ANGIOGRAM;  Surgeon: Laverda Page, MD;  Location: [facility] CATH LAB;  Service: Cardiovascular;  Laterality: N/A;   PERCUTANEOUS CORONARY STENT INTERVENTION (PCI-S) N/A 06/04/2012   Procedure: PERCUTANEOUS CORONARY STENT INTERVENTION (PCI-S);  Surgeon: Laverda Page, MD;  Location: [facility] CATH LAB;  Service: Cardiovascular;  Laterality: N/A;   POLYPECTOMY  04/16/2019   Procedure: POLYPECTOMY;  Surgeon: Danie Binder, MD;  [facility];   Service: Endoscopy;;   TONSILLECTOMY     TOTAL HIP ARTHROPLASTY Right 06/24/2019   Procedure: TOTAL HIP ARTHROPLASTY;  Surgeon: Marchia Bond, MD;  [facility];  Service: Orthopedics;  Laterality: Right;   VENTRAL HERNIA REPAIR  2010   umb hernia-[facility]-went home same day    Family History  Problem Relation Age of Onset   Cancer Mother    Cancer Father    Coronary artery disease Neg Hx     Social History   Socioeconomic History   Marital status: Married    Spouse name: Not on file   Number of children: 2   Years of education: Not on file   Highest education level: Not on file  Occupational History   Occupation: Games developer    Comment: Research officer, trade [employer]  Tobacco Use   Smoking status: Never   Smokeless tobacco: Never  Vaping Use   Vaping Use: Never used  Substance and Sexual Activity   Alcohol use: No   Drug use: No   Sexual activity: Not Currently    Birth control/protection: None  Other Topics Concern   Not on file  Social History Narrative   Married for 82 yrs,lives with wife.Works part-time with [employer].   Social Determinants of Health   Financial Resource Strain: Not on file  Food Insecurity: Not on file  Transportation Needs: Not on file  Physical Activity: Not on file  Stress: Not on file  Social Connections: Not on file  Intimate Partner Violence: Not on file    ROS Review of Systems  Constitutional: Negative.   Respiratory: Negative.    Cardiovascular: Negative.   Gastrointestinal: Negative.   Psychiatric/Behavioral: Negative.     Objective:   Today's Vitals: BP 112/68 (BP Location: Left Arm, Patient Position: Sitting, Cuff Size: Normal)    Pulse 74    Ht 5\' 8"  (1.727 m)    Wt 257 lb 1.9 oz (116.6 kg)    SpO2 90%    BMI 39.09 kg/m   Physical Exam Constitutional:      General: He is not in acute distress.    Appearance: He is obese. He is not ill-appearing, toxic-appearing or diaphoretic.  Cardiovascular:      Rate and Rhythm: Normal rate and regular rhythm.     Pulses: Normal pulses.     Heart sounds: Normal heart sounds. No murmur heard.   No friction rub. No gallop.  Pulmonary:     Effort: No respiratory distress.     Breath sounds: No stridor. No wheezing, rhonchi or rales.  Chest:     Chest wall: No tenderness.  Abdominal:     Palpations: Abdomen is soft.  Musculoskeletal:        General: No swelling, tenderness, deformity or signs of injury.     Right lower leg: No edema.     Left lower leg: No edema.  Neurological:     Mental  Status: He is alert.  Psychiatric:        Mood and Affect: Mood normal.        Behavior: Behavior normal.        Thought Content: Thought content normal.        Judgment: Judgment normal.    Assessment & Plan:   Problem List Items Addressed This Visit   None   Outpatient Encounter Medications as of 11/01/2021  Medication Sig   acebutolol (SECTRAL) 200 MG capsule TAKE (1) CAPSULE BY MOUTH TWICE DAILY.   apixaban (ELIQUIS) 5 MG TABS tablet Take 5 mg by mouth 2 (two) times daily.   Calcium-Vitamin D-Vitamin K 650-12.5-40 MG-MCG-MCG CHEW Chew 2 tablets by mouth at bedtime.   cetirizine (ZYRTEC ALLERGY) 10 MG tablet Take 1 tablet (10 mg total) by mouth daily.   empagliflozin (JARDIANCE) 25 MG TABS tablet Take 12.5 mg by mouth daily.   gabapentin (NEURONTIN) 100 MG capsule TAKE (1) CAPSULE BY MOUTH TWICE DAILY. (Patient taking differently: Take 100 mg by mouth 2 (two) times daily.)   lisinopril-hydrochlorothiazide (PRINZIDE,ZESTORETIC) 20-25 MG per tablet Take 1 tablet by mouth daily.   metFORMIN (GLUCOPHAGE) 1000 MG tablet TAKE (1) TABLET BY MOUTH TWICE DAILY. (Patient taking differently: Take 1,000 mg by mouth 2 (two) times a day.)   Multiple Vitamins-Minerals (ONE-A-DAY 50 PLUS PO) Take 1 tablet by mouth at bedtime.    nitroGLYCERIN (NITROSTAT) 0.4 MG SL tablet PLACE 1 TAB UNDER TONGUE EVERY 5 MIN IF NEEDED FOR CHEST PAIN. MAY USE 3 TIMES.NO RELIEF CALL  911.   omeprazole (PRILOSEC) 20 MG capsule TAKE 1 CAPSULE 30 MINUTES BEFORE BREAKFAST.   rosuvastatin (CRESTOR) 20 MG tablet Take 1 tablet (20 mg total) by mouth daily.   spironolactone (ALDACTONE) 50 MG tablet Take 1 tablet (50 mg total) by mouth every morning.   tamsulosin (FLOMAX) 0.4 MG CAPS capsule Take 0.4 mg by mouth daily.   thyroid (NP THYROID) 60 MG tablet Take 1 tablet (60 mg total) by mouth daily before breakfast.   torsemide (DEMADEX) 20 MG tablet Take 20 mg by mouth daily as needed (swelling).    traZODone (DESYREL) 100 MG tablet Take 100 mg by mouth at bedtime.   verapamil (CALAN-SR) 240 MG CR tablet Take 1 tablet (240 mg total) by mouth at bedtime.   vitamin B-12 (CYANOCOBALAMIN) 500 MCG tablet Take 500 mcg by mouth daily.   Vitamin D, Ergocalciferol, (DRISDOL) 1.25 MG (50000 UT) CAPS capsule TAKE 1 CAPSULE BY MOUTH ONCE A WEEK.   fluticasone (FLONASE) 50 MCG/ACT nasal spray Place 1 spray into both nostrils daily for 14 days.   No facility-administered encounter medications on file as of 11/01/2021.    Follow-up: No follow-ups on file.   Renee Rival, FNP

## 2021-11-01 NOTE — Assessment & Plan Note (Signed)
Takes Eliquis 5 mg 2 times daily

## 2021-11-01 NOTE — Assessment & Plan Note (Signed)
Stable condition Followed by Dr. Einar Gip. He has not required nitro in a while On crestor 20mg  daily.  Currently taking pravastatin but will  switched to Crestor as soon as he gets it from his pharmacy

## 2021-11-03 LAB — CMP14+EGFR
ALT: 11 IU/L (ref 0–44)
AST: 16 IU/L (ref 0–40)
Albumin/Globulin Ratio: 2.1 (ref 1.2–2.2)
Albumin: 4.2 g/dL (ref 3.7–4.7)
Alkaline Phosphatase: 68 IU/L (ref 44–121)
BUN/Creatinine Ratio: 29 — ABNORMAL HIGH (ref 10–24)
BUN: 34 mg/dL — ABNORMAL HIGH (ref 8–27)
Bilirubin Total: 0.6 mg/dL (ref 0.0–1.2)
CO2: 23 mmol/L (ref 20–29)
Calcium: 10.3 mg/dL — ABNORMAL HIGH (ref 8.6–10.2)
Chloride: 99 mmol/L (ref 96–106)
Creatinine, Ser: 1.17 mg/dL (ref 0.76–1.27)
Globulin, Total: 2 g/dL (ref 1.5–4.5)
Glucose: 170 mg/dL — ABNORMAL HIGH (ref 70–99)
Potassium: 4.8 mmol/L (ref 3.5–5.2)
Sodium: 136 mmol/L (ref 134–144)
Total Protein: 6.2 g/dL (ref 6.0–8.5)
eGFR: 64 mL/min/{1.73_m2} (ref >59)

## 2021-11-03 LAB — LIPID PANEL
Chol/HDL Ratio: 4.2 ratio (ref 0.0–5.0)
Cholesterol, Total: 202 mg/dL — ABNORMAL HIGH (ref 100–199)
HDL: 48 mg/dL (ref >39)
LDL Chol Calc (NIH): 126 mg/dL — ABNORMAL HIGH (ref 0–99)
Triglycerides: 159 mg/dL — ABNORMAL HIGH (ref 0–149)
VLDL Cholesterol Cal: 28 mg/dL (ref 5–40)

## 2021-11-03 LAB — CBC WITH DIFFERENTIAL/PLATELET
Basophils Absolute: 0.1 10*3/uL (ref 0.0–0.2)
Basos: 1 %
EOS (ABSOLUTE): 0.1 10*3/uL (ref 0.0–0.4)
Eos: 1 %
Hematocrit: 46.1 % (ref 37.5–51.0)
Hemoglobin: 15.7 g/dL (ref 13.0–17.7)
Immature Grans (Abs): 0 10*3/uL (ref 0.0–0.1)
Immature Granulocytes: 0 %
Lymphocytes Absolute: 1.7 10*3/uL (ref 0.7–3.1)
Lymphs: 18 %
MCH: 29.4 pg (ref 26.6–33.0)
MCHC: 34.1 g/dL (ref 31.5–35.7)
MCV: 86 fL (ref 79–97)
Monocytes Absolute: 0.9 10*3/uL (ref 0.1–0.9)
Monocytes: 9 %
Neutrophils Absolute: 6.7 10*3/uL (ref 1.4–7.0)
Neutrophils: 71 %
Platelets: 277 10*3/uL (ref 150–450)
RBC: 5.34 x10E6/uL (ref 4.14–5.80)
RDW: 12.9 % (ref 11.6–15.4)
WBC: 9.5 10*3/uL (ref 3.4–10.8)

## 2021-11-03 LAB — HEMOGLOBIN A1C
Est. average glucose Bld gHb Est-mCnc: 189 mg/dL
Hgb A1c MFr Bld: 8.2 % — ABNORMAL HIGH (ref 4.8–5.6)

## 2021-11-03 LAB — VITAMIN D 25 HYDROXY (VIT D DEFICIENCY, FRACTURES): Vit D, 25-Hydroxy: 69.3 ng/mL (ref 30.0–100.0)

## 2021-11-03 NOTE — Progress Notes (Signed)
Please review labs with patient.  His LDL is not at goal, patient should start taking rosuvastatin as ordered.  Please schedule patient for a 6-week follow-up appointment to recheck his LDL levels.Eat a healthy diet, including lots of fruits and vegetables. Avoid foods with a lot of saturated and trans fats, such as red meat, butter, fried foods and cheese . Maintain a healthy weight.  I have ordered labs to be drawn 3 to 5 days before his next visit. Thank you.

## 2021-11-03 NOTE — Progress Notes (Signed)
Pt should start taking jardiance 25mg  daily instead of Jardiance 12.5mg  daily  due too A1C  of 8.2. avoid sugars, soda. A1c increased from 7.5 to 8.2  Thanks

## 2021-11-18 ENCOUNTER — Other Ambulatory Visit: Payer: Self-pay

## 2021-11-18 ENCOUNTER — Encounter: Payer: Self-pay | Admitting: Cardiology

## 2021-11-18 DIAGNOSIS — I25118 Atherosclerotic heart disease of native coronary artery with other forms of angina pectoris: Secondary | ICD-10-CM

## 2021-11-18 DIAGNOSIS — I10 Essential (primary) hypertension: Secondary | ICD-10-CM

## 2021-11-18 DIAGNOSIS — E7800 Pure hypercholesterolemia, unspecified: Secondary | ICD-10-CM

## 2021-11-18 MED ORDER — ROSUVASTATIN CALCIUM 20 MG PO TABS: 20.0000 mg | ORAL_TABLET | Freq: Every day | ORAL | 3 refills | Status: DC

## 2021-11-18 MED ORDER — ACEBUTOLOL HCL 200 MG PO CAPS: ORAL_CAPSULE | ORAL | 3 refills | Status: DC

## 2021-11-18 NOTE — Telephone Encounter (Signed)
Can you print these two medications and I will fax them. Please and thank you.

## 2021-11-26 ENCOUNTER — Other Ambulatory Visit: Payer: Self-pay | Admitting: Cardiology

## 2021-11-26 DIAGNOSIS — I25118 Atherosclerotic heart disease of native coronary artery with other forms of angina pectoris: Secondary | ICD-10-CM

## 2021-11-29 ENCOUNTER — Ambulatory Visit: Payer: PPO

## 2021-11-29 ENCOUNTER — Other Ambulatory Visit: Payer: Self-pay

## 2021-12-05 ENCOUNTER — Ambulatory Visit: Payer: PPO

## 2021-12-26 ENCOUNTER — Ambulatory Visit: Payer: PPO

## 2022-01-02 ENCOUNTER — Ambulatory Visit: Payer: PPO

## 2022-01-10 ENCOUNTER — Other Ambulatory Visit: Payer: Self-pay | Admitting: Cardiology

## 2022-01-10 DIAGNOSIS — I10 Essential (primary) hypertension: Secondary | ICD-10-CM

## 2022-01-16 ENCOUNTER — Ambulatory Visit (INDEPENDENT_AMBULATORY_CARE_PROVIDER_SITE_OTHER): Payer: PPO

## 2022-01-16 DIAGNOSIS — Z0000 Encounter for general adult medical examination without abnormal findings: Secondary | ICD-10-CM

## 2022-01-16 NOTE — Patient Instructions (Signed)
?  Omar [name] , ?Thank you for taking time to come for your Medicare Wellness Visit. I appreciate your ongoing commitment to your health goals. Please review the following plan we discussed and let me know if I can assist you in the future.  ? ?These are the goals we discussed: ? Goals   ?None ?  ?  ?This is a list of the screening recommended for you and due dates:  ?Health Maintenance  ?Topic Date Due  ? Complete foot exam   Never done  ? COVID-19 Vaccine (4 - Booster for Pfizer series) 10/07/2020  ? Eye exam for diabetics  09/03/2021  ? Flu Shot  05/02/2022  ? Hemoglobin A1C  05/02/2022  ? Tetanus Vaccine  09/10/2031  ? Pneumonia Vaccine  Completed  ? Hepatitis C Screening: USPSTF Recommendation to screen - Ages 16-79 yo.  Completed  ? Zoster (Shingles) Vaccine  Completed  ? HPV Vaccine  Aged Out  ? Colon Cancer Screening  Discontinued  ?  ?

## 2022-01-16 NOTE — Progress Notes (Signed)
? ?Subjective:  ? Omar [name] is a 76 y.o. male who presents for an Initial Medicare Annual Wellness Visit. ?I connected with  [name] on 01/16/22 by a audio enabled telemedicine application and verified that I am speaking with the correct person using two identifiers. ? ?Patient Location: Home ? ?Provider Location: Office/Clinic ? ?I discussed the limitations of evaluation and management by telemedicine. The patient expressed understanding and agreed to proceed.  ?Review of Systems    ? ?  ? ?   ?Objective:  ?  ?There were no vitals filed for this visit. ?There is no height or weight on file to calculate BMI. ? ? ?  06/24/2019  ?  5:00 PM 06/18/2019  ? 10:13 AM 04/16/2019  ?  7:34 AM 02/02/2019  ? 12:24 PM 07/17/2017  ? 10:48 AM 01/16/2017  ? 10:04 AM 12/26/2016  ?  3:21 PM  ?Advanced Directives  ?Does Patient Have a Medical Advance Directive? No No No No Yes No No  ?Type of Production manager of Attorney    ?Does patient want to make changes to medical advance directive?     No - Patient declined    ?Copy of [place] in Chart?     No - copy requested    ?Would patient like information on creating a medical advance directive? No - Patient declined No - Patient declined No - Patient declined   No - Patient declined No - Patient declined  ? ? ?Current Medications (verified) ?Outpatient Encounter Medications as of 01/16/2022  ?Medication Sig  ? acebutolol (SECTRAL) 200 MG capsule TAKE (1) CAPSULE BY MOUTH TWICE DAILY.  ? apixaban (ELIQUIS) 5 MG TABS tablet Take 5 mg by mouth 2 (two) times daily.  ? Calcium-Vitamin D-Vitamin K 650-12.5-40 MG-MCG-MCG CHEW Chew 2 tablets by mouth at bedtime.  ? cetirizine (ZYRTEC ALLERGY) 10 MG tablet Take 1 tablet (10 mg total) by mouth daily.  ? empagliflozin (JARDIANCE) 25 MG TABS tablet Take 12.5 mg by mouth daily.  ? fluticasone (FLONASE) 50 MCG/ACT nasal spray Place 1 spray into both nostrils daily for 14 days.  ?  lisinopril-hydrochlorothiazide (PRINZIDE,ZESTORETIC) 20-25 MG per tablet Take 1 tablet by mouth daily.  ? metFORMIN (GLUCOPHAGE) 1000 MG tablet TAKE (1) TABLET BY MOUTH TWICE DAILY. (Patient taking differently: Take 1,000 mg by mouth 2 (two) times a day.)  ? Multiple Vitamins-Minerals (ONE-A-DAY 50 PLUS PO) Take 1 tablet by mouth at bedtime.   ? nitroGLYCERIN (NITROSTAT) 0.4 MG SL tablet PLACE 1 TAB UNDER TONGUE EVERY 5 MIN IF NEEDED FOR CHEST PAIN. MAY USE 3 TIMES.NO RELIEF CALL 911.  ? omeprazole (PRILOSEC) 20 MG capsule TAKE 1 CAPSULE 30 MINUTES BEFORE BREAKFAST.  ? rosuvastatin (CRESTOR) 20 MG tablet Take 1 tablet (20 mg total) by mouth daily.  ? spironolactone (ALDACTONE) 50 MG tablet Take 1 tablet (50 mg total) by mouth every morning.  ? tamsulosin (FLOMAX) 0.4 MG CAPS capsule Take 0.4 mg by mouth daily.  ? thyroid (NP THYROID) 60 MG tablet Take 1 tablet (60 mg total) by mouth daily before breakfast.  ? torsemide (DEMADEX) 20 MG tablet Take 20 mg by mouth daily as needed (swelling).  (Patient not taking: Reported on 11/01/2021)  ? verapamil (CALAN-SR) 240 MG CR tablet Take 1 tablet (240 mg total) by mouth at bedtime.  ? vitamin B-12 (CYANOCOBALAMIN) 500 MCG tablet Take 500 mcg by mouth daily.  ? Vitamin D, Ergocalciferol, (DRISDOL) 1.25 MG (  50000 UT) CAPS capsule TAKE 1 CAPSULE BY MOUTH ONCE A WEEK.  ? ?No facility-administered encounter medications on file as of 01/16/2022.  ? ? ?Allergies (verified) ?Patient has no known allergies.  ? ?History: ?Past Medical History:  ?Diagnosis Date  ? Abnormal EKG   ? Inferior Q waves  ? BPH (benign prostatic hyperplasia)   ? Chronic anticoagulation 01/16/2017  ? Coronary artery disease   ? Degenerative joint disease   ? Diabetes mellitus, type 2 ([place])   ? No insulin  ? DVT (deep venous thrombosis) ([place]) 4 -5 yrs ago  ? DVT of leg (deep venous thrombosis) ([place]) 07/02/2015  ? Heart murmur   ? Hepatic steatosis   ? Heterozygous factor V Leiden mutation ([place]) 01/16/2017  ?  Hyperlipidemia   ? Hypertension   ? 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 Meningioma ([place])   ? Left frontal; CVA identified by MRI; no neurologic symptoms  ? Neuropathy   ? Obesity   ? Primary localized osteoarthritis of right hip 06/24/2019  ? Sleep apnea 2008  ? Dr. Merlene Laughter interpreted the study-CPAP recommended, but refused by patient  ? Stroke [facility])   ? no deficits from stroke, "Didnt know I had one when they say I did".  ? ?Past Surgical History:  ?Procedure Laterality Date  ? BIOPSY  04/16/2019  ? Procedure: BIOPSY;  Surgeon: Danie Binder, MD;  [facility];  Service: Endoscopy;;  ? CATARACT EXTRACTION W/PHACO Left 04/03/2016  ? Procedure: CATARACT EXTRACTION PHACO AND INTRAOCULAR LENS PLACEMENT LEFT EYE CDE=21.76;  Surgeon: Williams Che, MD;  Location: [facility];  Service: Ophthalmology;  Laterality: Left;  ? CATARACT EXTRACTION W/PHACO Right 06/19/2016  ? Procedure: CATARACT EXTRACTION PHACO AND INTRAOCULAR LENS PLACEMENT; CDE:  11.56;  Surgeon: Williams Che, MD;  Location[facility];  Service: Ophthalmology;  Laterality: Right;  ? COLONOSCOPY N/A 04/16/2019  ? Procedure: COLONOSCOPY;  Surgeon: Danie Binder, MD;  [facility];  Service: Endoscopy;  Laterality: N/A;  8:30am  ? COLONOSCOPY W/ POLYPECTOMY  05/2008  ? polypectomy x4-adenomatous; internal hemorrhoids  ? CORONARY ANGIOPLASTY WITH STENT PLACEMENT  06/04/2012    ? DES    to [place]  ? CORONARY STENT PLACEMENT    ? x 1  ? DUPUYTREN / PALMAR FASCIOTOMY  2009  ? rt hand-dsc-went home same day x2  ? ESOPHAGOGASTRODUODENOSCOPY N/A 04/16/2019  ? Procedure: ESOPHAGOGASTRODUODENOSCOPY (EGD);  Surgeon: Danie Binder, MD;  [facility];  Service: Endoscopy;  Laterality: N/A;  ? LEFT HEART CATHETERIZATION WITH CORONARY ANGIOGRAM N/A 05/28/2012  ? Procedure: LEFT HEART CATHETERIZATION WITH CORONARY ANGIOGRAM;  Surgeon: Laverda Page, MD;  Location[facility] CATH LAB;   Service: Cardiovascular;  Laterality: N/A;  ? PERCUTANEOUS CORONARY STENT INTERVENTION (PCI-S) N/A 06/04/2012  ? Procedure: PERCUTANEOUS CORONARY STENT INTERVENTION (PCI-S);  Surgeon: Laverda Page, MD;  Location: [facility] CATH LAB;  Service: Cardiovascular;  Laterality: N/A;  ? POLYPECTOMY  04/16/2019  ? Procedure: POLYPECTOMY;  Surgeon: Danie Binder, MD;  [facility];  Service: Endoscopy;;  ? TONSILLECTOMY    ? TOTAL HIP ARTHROPLASTY Right 06/24/2019  ? Procedure: TOTAL HIP ARTHROPLASTY;  Surgeon: Marchia Bond, MD;  [facility];  Service: Orthopedics;  Laterality: Right;  ? VENTRAL HERNIA REPAIR  2010  ? umb hernia-[facility]-went home same day  ? ?Family History  ?Problem Relation Age of Onset  ? Cancer Mother   ?  Brain cancer Mother   ? Cancer Father   ? Prostate cancer Father   ? Breast cancer Daughter   ? Thyroid cancer Daughter   ? Coronary artery disease Neg Hx   ? Colon cancer Neg Hx   ? ?Social History  ? ?Socioeconomic History  ? Marital status: Married  ?  Spouse name: Not on file  ? Number of children: 2  ? Years of education: Not on file  ? Highest education level: Not on file  ?Occupational History  ? Occupation: Games developer  ?  Comment: [employer]  ?Tobacco Use  ? Smoking status: Never  ? Smokeless tobacco: Never  ?Vaping Use  ? Vaping Use: Never used  ?Substance and Sexual Activity  ? Alcohol use: No  ? Drug use: No  ? Sexual activity: Not Currently  ?  Birth control/protection: None  ?Other Topics Concern  ? Not on file  ?Social History Narrative  ? Married for 72 yrs,lives with wife.Works  part time at [employer].  ? ?Social Determinants of Health  ? ?Financial Resource Strain: Not on file  ?Food Insecurity: Not on file  ?Transportation Needs: Not on file  ?Physical Activity: Not on file  ?Stress: Not on file  ?Social Connections: Not on file  ? ? ?Tobacco Counseling ?Counseling given: Not Answered ? ? ?Clinical Intake: ? ?  ? ?  ? ?  ? ?  ? ?   ? ?Diabetic?yes ? ?Nutrition Risk Assessment: ? ?Has the patient had any N/V/D within the last 2 months?  No  ?Does the patient have any non-healing wounds?  No  ?Has the patient had any unintentional weight loss or weight gain?  No  ? ?Diabetes: ? ?Is

## 2022-01-27 ENCOUNTER — Encounter: Payer: Self-pay | Admitting: Nurse Practitioner

## 2022-01-27 ENCOUNTER — Ambulatory Visit (INDEPENDENT_AMBULATORY_CARE_PROVIDER_SITE_OTHER): Payer: PPO | Admitting: Nurse Practitioner

## 2022-01-27 VITALS — BP 115/79 | HR 72 | Ht 68.0 in | Wt 248.0 lb

## 2022-01-27 DIAGNOSIS — I10 Essential (primary) hypertension: Secondary | ICD-10-CM

## 2022-01-27 DIAGNOSIS — E1159 Type 2 diabetes mellitus with other circulatory complications: Secondary | ICD-10-CM

## 2022-01-27 DIAGNOSIS — E559 Vitamin D deficiency, unspecified: Secondary | ICD-10-CM | POA: Diagnosis not present

## 2022-01-27 DIAGNOSIS — Z1329 Encounter for screening for other suspected endocrine disorder: Secondary | ICD-10-CM | POA: Diagnosis not present

## 2022-01-27 DIAGNOSIS — E782 Mixed hyperlipidemia: Secondary | ICD-10-CM | POA: Diagnosis not present

## 2022-01-27 DIAGNOSIS — E291 Testicular hypofunction: Secondary | ICD-10-CM | POA: Diagnosis not present

## 2022-01-27 MED ORDER — EMPAGLIFLOZIN 25 MG PO TABS: 25.0000 mg | ORAL_TABLET | Freq: Every day | ORAL | 0 refills | Status: DC

## 2022-01-27 NOTE — Assessment & Plan Note (Addendum)
BP Readings from Last 3 Encounters:  ?01/27/22 115/79  ?11/01/21 112/68  ?10/14/21 118/64  ?Chronic condition well-controlled on lisinopril hydrochlorothiazide 20-25 mg daily, spironolactone 50 mg daily, acebutolol 200 mg twice daily,  ?Not sure if he is taking Verapamil '240mg'$   ?Patient told to bring all medications to his next follow-up appointment. ?DASH diet advised, exercise regularly 30 minutes daily as tolerated. ?

## 2022-01-27 NOTE — Progress Notes (Signed)
? ?DAEGON DEISS     MRN: [id number]      DOB: 18-Jul-1946 ? ? ?HPI ?Mr. Roscher with past medical history of essential hypertension, type 2 diabetes, CAD, DVT of leg, sleep apnea, vitamin D deficiency is here for follow up and re-evaluation of chronic medical conditions, medication management a ? ?He has been taking Jardiance 12.5 mg daily, metformin 1000 mg twice daily, he has not been checking blood sugar at home, denies drinking sugary drinks.,  Polyphagia,polydipsia. ? ?Patient reports decreased energy, decreased appetite since he stopped taking NP thyroid, has no diagnosis of hypothyroidism, there would like thyroid function labs checked today. ? ?Patient reports erectile dysfunction states that he was on testosterone injection for low testosterone levels he has never been on medications for erectile dysfunction because he does not want to take additional pills.  Patient would like his testosterone levels checked, I discussed the need for referral to urology if his labs comes back low ? ? ? ?ROS ?Denies recent fever or chills. ?Denies sinus pressure, nasal congestion, ear pain or sore throat. ?Denies chest congestion, productive cough or wheezing. ?Denies chest pains, palpitations and leg swelling ?Denies abdominal pain, nausea, vomiting,diarrhea or constipation.   ?Denies dysuria, frequency, hesitancy or incontinence. ?Denies joint pain, swelling and limitation in mobility. ?Denies headaches, seizures, numbness, or tingling. ?Denies depression, anxiety or insomnia. ? ? ? ?PE ? ?BP 115/79 (BP Location: Right Arm, Patient Position: Sitting, Cuff Size: Large)   Pulse 72   Ht '5\' 8"'$  (1.727 m)   Wt 248 lb (112.5 kg)   SpO2 97%   BMI 37.71 kg/m?  ? ?Patient alert and oriented and in no cardiopulmonary distress. ? ?Chest: Clear to auscultation bilaterally. ? ?CVS: S1, S2 no murmurs, no S3.Regular rate. ? ?ABD: Soft non tender.  ? ?Ext: No edema ? ?MS: Adequate ROM spine, shoulders, hips and knees. ? ?Psych: Good  eye contact, normal affect. Memory intact not anxious or depressed appearing. ? ?CNS: CN 2-12 intact, power,  normal throughout.no focal deficits noted. ? ? ?Assessment & Plan ? ?Essential hypertension ?BP Readings from Last 3 Encounters:  ?01/27/22 115/79  ?11/01/21 112/68  ?10/14/21 118/64  ?Chronic condition well-controlled on lisinopril hydrochlorothiazide 20-25 mg daily, spironolactone 50 mg daily, acebutolol 200 mg twice daily,  ?Not sure if he is taking Verapamil '240mg'$   ?Patient told to bring all medications to his next follow-up appointment. ?DASH diet advised, exercise regularly 30 minutes daily as tolerated. ? ?Type 2 diabetes mellitus with vascular disease ([place]) ?Lab Results  ?Component Value Date  ? HGBA1C 8.2 (H) 11/02/2021  ? ?Currently on Jardiance 12.5 mg daily, metformin 1000 mg twice daily.  States that he was not aware of dose increase to Jardiance 25 mg daily ?Patient told to start taking Jardiance 25 mg daily, metformin 1000 mg twice daily. ?Avoid sweets sugar soda ?Foot exam completed today, states that he will be getting his nail cut at the podiatrist. ?We will recheck A1c, urine microalbumin in 2 months,  ?States that he was here for his eye exam but they were unable to complete the test. ?Will refer patient to ophthalmologist at his next visit  ? ? ?Mixed hyperlipidemia ?Currently on Crestor 20 mg daily ?Check fasting lipids ?Lab Results  ?Component Value Date  ? CHOL 202 (H) 11/02/2021  ? HDL 48 11/02/2021  ? LDLCALC 126 (H) 11/02/2021  ? TRIG 159 (H) 11/02/2021  ? CHOLHDL 4.2 11/02/2021  ? ? ?Vitamin D deficiency ?Last vitamin D ?Lab Results  ?  Component Value Date  ? VD25OH 69.3 11/02/2021  ?Patient's wife reports that patient has been on vitamin D 50,000 units once weekly ?We will check vitamin D levels today. ?Patient encouraged to bring all medication with him to his next follow-up appointment. ? ?Morbid obesity ([place]) ?Wt Readings from Last 3 Encounters:  ?01/27/22 248 lb (112.5 kg)   ?11/01/21 257 lb 1.9 oz (116.6 kg)  ?10/14/21 259 lb 12.8 oz (117.8 kg)  ?Patient encouraged to exercise at least 30 minutes daily as tolerated, increase intake of whole foods consisting mainly vegetables protein less carbohydrate drink at least 64 ounces of water daily importance of portion control also discussed. ?On Jardiance '25mg'$  daily, this should assist with weight loss ? ?Screening for thyroid disorder ?Reports low energy, low apetite since he stopped taking NP thyroid ?Has no diagnosis of hypothyroidism ?Check TSH  T4 ? ?Testosterone deficiency in male ?Lab Results  ?Component Value Date  ? TESTOSTERONE 676 05/18/2020  ?Previously getting testosterone injection ?We will check testosterone levels today and referred to urology if needed.  ?

## 2022-01-27 NOTE — Assessment & Plan Note (Signed)
Lab Results  ?Component Value Date  ? HGBA1C 8.2 (H) 11/02/2021  ? ?Currently on Jardiance 12.5 mg daily, metformin 1000 mg twice daily.  States that he was not aware of dose increase to Jardiance 25 mg daily ?Patient told to start taking Jardiance 25 mg daily, metformin 1000 mg twice daily. ?Avoid sweets sugar soda ?Foot exam completed today, states that he will be getting his nail cut at the podiatrist. ?We will recheck A1c, urine microalbumin in 2 months,  ?States that he was here for his eye exam but they were unable to complete the test. ?Will refer patient to ophthalmologist at his next visit  ? ?

## 2022-01-27 NOTE — Assessment & Plan Note (Signed)
Wt Readings from Last 3 Encounters:  ?01/27/22 248 lb (112.5 kg)  ?11/01/21 257 lb 1.9 oz (116.6 kg)  ?10/14/21 259 lb 12.8 oz (117.8 kg)  ?Patient encouraged to exercise at least 30 minutes daily as tolerated, increase intake of whole foods consisting mainly vegetables protein less carbohydrate drink at least 64 ounces of water daily importance of portion control also discussed. ?On Jardiance '25mg'$  daily, this should assist with weight loss ?

## 2022-01-27 NOTE — Assessment & Plan Note (Signed)
Lab Results  ?Component Value Date  ? TESTOSTERONE 676 05/18/2020  ?Previously getting testosterone injection ?We will check testosterone levels today and referred to urology if needed. ?

## 2022-01-27 NOTE — Assessment & Plan Note (Signed)
Currently on Crestor 20 mg daily ?Check fasting lipids ?Lab Results  ?Component Value Date  ? CHOL 202 (H) 11/02/2021  ? HDL 48 11/02/2021  ? LDLCALC 126 (H) 11/02/2021  ? TRIG 159 (H) 11/02/2021  ? CHOLHDL 4.2 11/02/2021  ? ?

## 2022-01-27 NOTE — Patient Instructions (Addendum)
Please start  taking jardiance '25mg'$  daily  ? ?It is important that you exercise regularly at least 30 minutes 5 times a week.  ?Think about what you will eat, plan ahead. ?Choose " clean, green, fresh or frozen" over canned, processed or packaged foods which are more sugary, salty and fatty. ?70 to 75% of food eaten should be vegetables and fruit. ?Three meals at set times with snacks allowed between meals, but they must be fruit or vegetables. ?Aim to eat over a 12 hour period , example 7 am to 7 pm, and STOP after  your last meal of the day. ?Drink water,generally about 64 ounces per day, no other drink is as healthy. Fruit juice is best enjoyed in a healthy way, by EATING the fruit. ? ?Thanks for choosing [facility], we consider it a privelige to serve you.  ?

## 2022-01-27 NOTE — Assessment & Plan Note (Addendum)
Reports low energy, low apetite since he stopped taking NP thyroid ?Has no diagnosis of hypothyroidism ?Check TSH  T4 ?

## 2022-01-27 NOTE — Assessment & Plan Note (Signed)
Last vitamin D ?Lab Results  ?Component Value Date  ? VD25OH 69.3 11/02/2021  ?Patient's wife reports that patient has been on vitamin D 50,000 units once weekly ?We will check vitamin D levels today. ?Patient encouraged to bring all medication with him to his next follow-up appointment. ?

## 2022-01-29 LAB — VITAMIN D 25 HYDROXY (VIT D DEFICIENCY, FRACTURES): Vit D, 25-Hydroxy: 90.2 ng/mL (ref 30.0–100.0)

## 2022-01-29 LAB — TSH+FREE T4
Free T4: 1.2 ng/dL (ref 0.82–1.77)
TSH: 1.63 u[IU]/mL (ref 0.450–4.500)

## 2022-01-29 LAB — TESTOSTERONE,FREE AND TOTAL
Testosterone, Free: 4 pg/mL — ABNORMAL LOW (ref 6.6–18.1)
Testosterone: 140 ng/dL — ABNORMAL LOW (ref 264–916)

## 2022-01-29 LAB — LIPID PANEL
Chol/HDL Ratio: 2.8 ratio (ref 0.0–5.0)
Cholesterol, Total: 151 mg/dL (ref 100–199)
HDL: 53 mg/dL (ref >39)
LDL Chol Calc (NIH): 70 mg/dL (ref 0–99)
Triglycerides: 164 mg/dL — ABNORMAL HIGH (ref 0–149)
VLDL Cholesterol Cal: 28 mg/dL (ref 5–40)

## 2022-01-30 ENCOUNTER — Other Ambulatory Visit: Payer: Self-pay | Admitting: Nurse Practitioner

## 2022-01-30 DIAGNOSIS — E291 Testicular hypofunction: Secondary | ICD-10-CM

## 2022-01-30 NOTE — Progress Notes (Signed)
Thyroid function is normal, no hypothyroidism  ? ?Vitamin D level is normal, he does not need the 50,000 units dose because his level is almost at the high end of normal.  he can stop taking supplement for now and we recheck his labs in 3 months. ? ?Testosterone level is low I have referred patient to urology for  treatment .

## 2022-01-30 NOTE — Progress Notes (Signed)
LDL is at goal, pt should continue crestor 20 mg daily as ordered.

## 2022-02-21 ENCOUNTER — Ambulatory Visit: Payer: PPO | Admitting: Urology

## 2022-02-21 VITALS — BP 120/74 | HR 72

## 2022-02-21 DIAGNOSIS — E291 Testicular hypofunction: Secondary | ICD-10-CM | POA: Diagnosis not present

## 2022-02-21 DIAGNOSIS — N5201 Erectile dysfunction due to arterial insufficiency: Secondary | ICD-10-CM | POA: Diagnosis not present

## 2022-02-21 MED ORDER — TESTOSTERONE CYPIONATE 200 MG/ML IM SOLN: 100.0000 mg | INTRAMUSCULAR | 1 refills | Status: DC

## 2022-02-21 NOTE — Progress Notes (Signed)
H&P  Chief Complaint: Low testosterone level, low energy level, erectile dysfunction  History of Present Illness: 76 year old male previously seen in our [place] office for kidney stone and overactive bladder.  Presents at this time for management of low testosterone level.  He has been on testosterone placement in the past, managed by Dr. Anastasio Champion.  Prior to repletion, in 2021 total testosterone was 69.  Repletion 6 months later, total testosterone was 676, free testosterone 130.  He has been on both injections and transdermal gels in the past.  He has been off of testosterone lesions since the death of his PCP.  Most recent total testosterone level 140, free testosterone 4.0.  He does have low energy level.  He does have a ED which at the age of 25.  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 Merlene Laughter interpreted the study-CPAP recommended, but refused by patient   Stroke [facility])    no deficits from stroke, "Didnt know I had one when they say I did".    Past Surgical History:  Procedure Laterality Date   BIOPSY  04/16/2019   Procedure: BIOPSY;  Surgeon: Danie Binder, MD;  Location: [facility];  Service: Endoscopy;;   CATARACT EXTRACTION W/PHACO  Left 04/03/2016   Procedure: CATARACT EXTRACTION PHACO AND INTRAOCULAR LENS PLACEMENT LEFT EYE CDE=21.76;  Surgeon: Williams Che, MD;  Location: [facility] ORS;  Service: Ophthalmology;  Laterality: Left;   CATARACT EXTRACTION W/PHACO Right 06/19/2016   Procedure: CATARACT EXTRACTION PHACO AND INTRAOCULAR LENS PLACEMENT; CDE:  11.56;  Surgeon: Williams Che, MD;  Location: [facility] ORS;  Service: Ophthalmology;  Laterality: Right;   COLONOSCOPY N/A 04/16/2019   Procedure: COLONOSCOPY;  Surgeon: Danie Binder, MD;  Location: [facility];  Service: Endoscopy;  Laterality: N/A;  8:30am   COLONOSCOPY W/ POLYPECTOMY  05/2008   polypectomy x4-adenomatous; internal hemorrhoids   CORONARY ANGIOPLASTY WITH STENT PLACEMENT  06/04/2012     DES    to [place]     x 1   DUPUYTREN / PALMAR FASCIOTOMY  2009   rt hand-dsc-went home same day x2   ESOPHAGOGASTRODUODENOSCOPY N/A 04/16/2019   Procedure: ESOPHAGOGASTRODUODENOSCOPY (EGD);  Surgeon: Danie Binder, MD;  [facility];  Service: Endoscopy;  Laterality: N/A;   LEFT HEART CATHETERIZATION WITH CORONARY ANGIOGRAM N/A 05/28/2012   Procedure: LEFT HEART CATHETERIZATION WITH CORONARY ANGIOGRAM;  Surgeon: Laverda Page, MD;  Location: [facility] CATH LAB;  Service: Cardiovascular;  Laterality: N/A;   PERCUTANEOUS CORONARY STENT INTERVENTION (PCI-S) N/A 06/04/2012   Procedure: PERCUTANEOUS CORONARY STENT INTERVENTION (PCI-S);  Surgeon:  Laverda Page, MD;  Location: [facility] CATH LAB;  Service: Cardiovascular;  Laterality: N/A;   POLYPECTOMY  04/16/2019   Procedure: POLYPECTOMY;  Surgeon: Danie Binder, MD;  [facility];  Service: Endoscopy;;   TONSILLECTOMY     TOTAL HIP ARTHROPLASTY Right 06/24/2019   Procedure: TOTAL HIP ARTHROPLASTY;  Surgeon: Marchia Bond, MD;  Location: [facility];  Service: Orthopedics;  Laterality: Right;   VENTRAL HERNIA REPAIR  2010   umb hernia-[facility]-went home same day    Home Medications:  Allergies as of  02/21/2022   No Known Allergies      Medication List        Accurate as of Feb 21, 2022  8:32 AM. If you have any questions, ask your nurse or doctor.          acebutolol 200 MG capsule Commonly known as: SECTRAL TAKE (1) CAPSULE BY MOUTH TWICE DAILY.   apixaban 5 MG Tabs tablet Commonly known as: ELIQUIS Take 5 mg by mouth 2 (two) times daily.   Calcium-Vitamin D-Vitamin K 650-12.5-40 MG-MCG-MCG Chew Chew 2 tablets by mouth at bedtime.   cetirizine 10 MG tablet Commonly known as: ZyrTEC Allergy Take 1 tablet (10 mg total) by mouth daily.   empagliflozin 25 MG Tabs tablet Commonly known as: Jardiance Take 1 tablet (25 mg total) by mouth daily.   fluticasone 50 MCG/ACT nasal spray Commonly known as: FLONASE Place 1 spray into both nostrils daily for 14 days.   lisinopril-hydrochlorothiazide 20-25 MG tablet Commonly known as: ZESTORETIC Take 1 tablet by mouth daily.   metFORMIN 1000 MG tablet Commonly known as: GLUCOPHAGE TAKE (1) TABLET BY MOUTH TWICE DAILY. What changed: See the new instructions.   nitroGLYCERIN 0.4 MG SL tablet Commonly known as: NITROSTAT PLACE 1 TAB UNDER TONGUE EVERY 5 MIN IF NEEDED FOR CHEST PAIN. MAY USE 3 TIMES.NO RELIEF CALL 911.   omeprazole 20 MG capsule Commonly known as: PRILOSEC TAKE 1 CAPSULE 30 MINUTES BEFORE BREAKFAST.   ONE-A-DAY 50 PLUS PO Take 1 tablet by mouth at bedtime.   rosuvastatin 20 MG tablet Commonly known as: CRESTOR Take 1 tablet (20 mg total) by mouth daily.   spironolactone 50 MG tablet Commonly known as: Aldactone Take 1 tablet (50 mg total) by mouth every morning.   tamsulosin 0.4 MG Caps capsule Commonly known as: FLOMAX Take 0.4 mg by mouth daily.   thyroid 60 MG tablet Commonly known as: NP Thyroid Take 1 tablet (60 mg total) by mouth daily before breakfast.   torsemide 20 MG tablet Commonly known as: DEMADEX Take 20 mg by mouth daily as needed (swelling).   verapamil 240 MG CR  tablet Commonly known as: CALAN-SR Take 1 tablet (240 mg total) by mouth at bedtime.   vitamin B-12 500 MCG tablet Commonly known as: CYANOCOBALAMIN Take 500 mcg by mouth daily.   Vitamin D (Ergocalciferol) 1.25 MG (50000 UNIT) Caps capsule Commonly known as: DRISDOL TAKE 1 CAPSULE BY MOUTH ONCE A WEEK.        Allergies: No Known Allergies  Family History  Problem Relation Age of Onset   Cancer Mother    Brain cancer Mother    Cancer Father    Prostate cancer Father    Breast cancer Daughter    Thyroid cancer Daughter    Coronary artery disease Neg Hx    Colon cancer Neg Hx     Social History:  reports that he has never smoked. He has never used smokeless tobacco.  He reports that he does not drink alcohol and does not use drugs.  ROS: A complete review of systems was performed.  All systems are negative except for pertinent findings as noted.  Physical Exam:  Vital signs in last 24 hours: There were no vitals taken for this visit. Constitutional:  Alert and oriented, No acute distress Cardiovascular: Regular rate  Respiratory: Normal respiratory effort Neurologic: Grossly intact, no focal deficits Psychiatric: Normal mood and affect  I have reviewed prior pt notes  I have reviewed notes from referring/previous physicians  I have reviewed urinalysis results testosterone/PSA results reviewed (last PSA 0.72 in 2021)    Impression/Assessment:  Low testosterone level, in the past he has been lesion, not recently with associated symptoms  Plan:  1.  I did discuss further repletion with testosterone cypionate, 100 mg IM weekly.  We will bring him in for nurse training  2.  I will see him back in a couple of months following laboratories

## 2022-03-01 ENCOUNTER — Ambulatory Visit (INDEPENDENT_AMBULATORY_CARE_PROVIDER_SITE_OTHER): Payer: PPO | Admitting: Physician Assistant

## 2022-03-01 ENCOUNTER — Other Ambulatory Visit: Payer: Self-pay | Admitting: Cardiology

## 2022-03-01 DIAGNOSIS — I10 Essential (primary) hypertension: Secondary | ICD-10-CM

## 2022-03-01 DIAGNOSIS — E291 Testicular hypofunction: Secondary | ICD-10-CM | POA: Diagnosis not present

## 2022-03-01 MED ORDER — TESTOSTERONE CYPIONATE 200 MG/ML IM SOLN
200.0000 mg | Freq: Once | INTRAMUSCULAR | Status: AC
  Administered 2022-03-01: 200 mg via INTRAMUSCULAR

## 2022-03-01 NOTE — Progress Notes (Signed)
Testosterone IM Injection  Due to Hypogonadism patient is present today for a Testosterone Injection.  Medication: Testosterone Cypionate Dose: '100mg'$   Location: right upper outer buttocks   Patient tolerated well, no complications were noted  Performed by: Estill Bamberg RN   Follow up: 1 week NV

## 2022-03-06 ENCOUNTER — Encounter: Payer: Self-pay | Admitting: Cardiology

## 2022-03-08 ENCOUNTER — Ambulatory Visit (INDEPENDENT_AMBULATORY_CARE_PROVIDER_SITE_OTHER): Payer: PPO | Admitting: Urology

## 2022-03-08 DIAGNOSIS — E291 Testicular hypofunction: Secondary | ICD-10-CM

## 2022-03-08 MED ORDER — TESTOSTERONE CYPIONATE 200 MG/ML IM SOLN
100.0000 mg | Freq: Once | INTRAMUSCULAR | Status: AC
  Administered 2022-03-08: 100 mg via INTRAMUSCULAR

## 2022-03-08 NOTE — Progress Notes (Signed)
Patient here today for testosterone IM injection.  Patient tolerated injection with no difficulty.  Return 1 week for NV testosterone injection.

## 2022-03-15 ENCOUNTER — Ambulatory Visit: Payer: PPO | Admitting: Physician Assistant

## 2022-03-22 ENCOUNTER — Ambulatory Visit (INDEPENDENT_AMBULATORY_CARE_PROVIDER_SITE_OTHER): Payer: PPO | Admitting: Physician Assistant

## 2022-03-22 DIAGNOSIS — E291 Testicular hypofunction: Secondary | ICD-10-CM

## 2022-03-22 MED ORDER — TESTOSTERONE CYPIONATE 200 MG/ML IM SOLN
100.0000 mg | Freq: Once | INTRAMUSCULAR | Status: AC
  Administered 2022-03-22: 100 mg via INTRAMUSCULAR

## 2022-03-22 NOTE — Progress Notes (Unsigned)
Testosterone IM Injection  Due to Hypogonadism patient is present today for a Testosterone Injection.  Medication: Testosterone Cypionate Dose: 64m Location: right upper outer thigh Lot: *** Exp:***  Patient tolerated well, no complications were noted  Performed by: KLevi Aland CMA  Follow up: Follow up in 1 week for next injection.

## 2022-03-29 ENCOUNTER — Ambulatory Visit (INDEPENDENT_AMBULATORY_CARE_PROVIDER_SITE_OTHER): Payer: PPO | Admitting: Physician Assistant

## 2022-03-29 ENCOUNTER — Encounter: Payer: Self-pay | Admitting: Internal Medicine

## 2022-03-29 DIAGNOSIS — E291 Testicular hypofunction: Secondary | ICD-10-CM

## 2022-03-29 MED ORDER — TESTOSTERONE CYPIONATE 200 MG/ML IM SOLN
100.0000 mg | Freq: Once | INTRAMUSCULAR | Status: AC
  Administered 2022-03-29: 100 mg via INTRAMUSCULAR

## 2022-03-29 NOTE — Progress Notes (Signed)
Testosterone IM Injection  Due to Hypogonadism patient is present today for a Testosterone Injection.  Medication: Testosterone Cypionate Dose: '100mg'$  Location: left upper outer buttocks Lot: [id number] Exp:09/02/2023  Patient tolerated well, no complications were noted  Performed by: Levi Aland, CMA  Follow up: Follow up in 1 week

## 2022-03-31 ENCOUNTER — Ambulatory Visit (INDEPENDENT_AMBULATORY_CARE_PROVIDER_SITE_OTHER): Payer: PPO | Admitting: Nurse Practitioner

## 2022-03-31 ENCOUNTER — Encounter: Payer: Self-pay | Admitting: Nurse Practitioner

## 2022-03-31 VITALS — BP 113/77 | HR 64 | Ht 68.0 in | Wt 253.0 lb

## 2022-03-31 DIAGNOSIS — E782 Mixed hyperlipidemia: Secondary | ICD-10-CM

## 2022-03-31 DIAGNOSIS — E291 Testicular hypofunction: Secondary | ICD-10-CM

## 2022-03-31 DIAGNOSIS — I10 Essential (primary) hypertension: Secondary | ICD-10-CM | POA: Diagnosis not present

## 2022-03-31 DIAGNOSIS — E1159 Type 2 diabetes mellitus with other circulatory complications: Secondary | ICD-10-CM | POA: Diagnosis not present

## 2022-03-31 DIAGNOSIS — E559 Vitamin D deficiency, unspecified: Secondary | ICD-10-CM | POA: Diagnosis not present

## 2022-03-31 NOTE — Progress Notes (Signed)
Omar [name]     MRN: [id number]      DOB: 08-30-1946   HPI Omar [name] with past medical history of type 2 diabetes, CAD, DVT, essential hypertension, testosterone deficiency in male is here for follow up and re-evaluation of chronic medical conditions, medication management and review of any available recent lab    Hypertension.  Currently on acebutolol 200 mg twice daily ,lisinopril-hydrochlorothiazide 20-25 1 tablet daily, spironolactone 50 mg daily,Verapamil to 40 mg daily at bedtime.  Takes torsemide 20 mg as needed for swelling.  Patient denies headache, dizziness, chest pain.  Type 2 diabetes.  Currently on Jardiance 25 mg daily, metformin 1000 mg twice daily.  Patient denies hypoglycemia, recent A1c was 7.8.  Patient stated that the doctors at the [facility] has ordered a new medication for his diabetes.  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 Denies sinus pressure, nasal congestion, ear pain or sore throat. Denies chest congestion, productive cough or wheezing. Denies chest pains, palpitations and leg swelling Denies abdominal pain, nausea, vomiting,diarrhea or constipation.   Denies dysuria, frequency, hesitancy or incontinence.. Denies headaches, seizures, numbness, or tingling. Denies depression, anxiety or insomnia.    PE  BP 113/77 (BP Location: Right Arm, Patient Position: Sitting, Cuff Size: Normal)   Pulse 64   Ht '5\' 8"'$  (1.727 m)   Wt 253 lb (114.8 kg)   SpO2 93%   BMI 38.47 kg/m   Patient alert and oriented and in no cardiopulmonary distress.  Chest: Clear to auscultation bilaterally.  CVS: S1, S2 no murmurs, no S3.Regular rate.  ABD: Soft non tender.   Ext: No edema  MS: Adequate ROM spine, shoulders, hips and knees, fifth fingers of bilateral hands contracted, no tenderness or  swelling noted  Psych: Good eye contact, normal affect. Memory intact not anxious or depressed appearing.   Assessment & Plan  Mixed hyperlipidemia The 10-year ASCVD risk score (Arnett DK, et al., 2019) is: 41.1%*   Values used to calculate the score:     Age: 76 years     Sex: Male     Is Non-Hispanic African American: No     Diabetic: Yes     Tobacco smoker: No     Systolic Blood Pressure: 924 mmHg     Is BP treated: Yes     HDL Cholesterol: 55 mg/dL*     Total Cholesterol: 151 mg/dL     * - Cholesterol units were assumed for this score calculation  Lab Results  Component Value Date   LDLCALC 70 01/27/2022   Currently on Crestor 20 mg daily Continue current medication  Essential hypertension BP Readings from Last 3 Encounters:  03/31/22 113/77  02/21/22 120/74  01/27/22 115/79  Chronic condition well-controlled Currently on acebutolol 200 mg twice daily ,lisinopril-hydrochlorothiazide 20-25 1 tablet daily, spironolactone 50 mg daily,Verapamil to 40 mg daily at bedtime.  Takes torsemide 20 mg as needed for swelling. Continue current medications DASH diet advised engage in regular walking exercise daily  Type 2 diabetes mellitus with vascular disease (HCC) Last A1c was 7.8 Currently on Jardiance 25 mg daily, metformin 1000 mg twice daily. Doctors at [place] are  making changes to his diabetic medication, they are not sure what the name of  The medication is.  Labs done at [facility]  was reviewed Avoid sugar sweets soda patient would like doctors at the [place] to be managing his diabetes and agrees to recommendation from them.    Vitamin D deficiency Patient told to stop taking vitamin D 50,000 units since his vitamin D level is normal.  Testosterone deficiency in male Currently getting testosterone injection '100mg'$  IM weekly  from urology Appreciate collaboration with urology

## 2022-03-31 NOTE — Patient Instructions (Addendum)
Please get your diabetic eye exam done   It is important that you exercise regularly at least 30 minutes 5 times a week.  Think about what you will eat, plan ahead. Choose " clean, green, fresh or frozen" over canned, processed or packaged foods which are more sugary, salty and fatty. 70 to 75% of food eaten should be vegetables and fruit. Three meals at set times with snacks allowed between meals, but they must be fruit or vegetables. Aim to eat over a 12 hour period , example 7 am to 7 pm, and STOP after  your last meal of the day. Drink water,generally about 64 ounces per day, no other drink is as healthy. Fruit juice is best enjoyed in a healthy way, by EATING the fruit.  Thanks for choosing [facility], we consider it a privelige to serve you.

## 2022-03-31 NOTE — Assessment & Plan Note (Addendum)
Currently getting testosterone injection '100mg'$  IM weekly  from urology Appreciate collaboration with urology

## 2022-03-31 NOTE — Assessment & Plan Note (Addendum)
The 10-year ASCVD risk score (Arnett DK, et al., 2019) is: 41.1%*   Values used to calculate the score:     Age: 76 years     Sex: Male     Is Non-Hispanic African American: No     Diabetic: Yes     Tobacco smoker: No     Systolic Blood Pressure: 802 mmHg     Is BP treated: Yes     HDL Cholesterol: 55 mg/dL*     Total Cholesterol: 151 mg/dL     * - Cholesterol units were assumed for this score calculation  Lab Results  Component Value Date   LDLCALC 70 01/27/2022   Currently on Crestor 20 mg daily Continue current medication

## 2022-03-31 NOTE — Assessment & Plan Note (Addendum)
Last A1c was 7.8 Currently on Jardiance 25 mg daily, metformin 1000 mg twice daily. Doctors at [place] are  making changes to his diabetic medication, they are not sure what the name of  The medication is.  Labs done at [facility] was reviewed Avoid sugar sweets soda patient would like doctors at the [place] to be managing his diabetes and agrees to recommendations from them.   MICROALB/CREAT URINE PANEL Coll. date: 03/03/22 @ 11:08 TEST RESULT RANGE CREATININE,RANDOM URINE 58 30-150 MICROALBUMIN,QUANTITATIVE 0.699 0.5-2.4 MICROALB/CREAT RATIO RESULT 12.1 "< 30"-"> = 30" Normal or Stable - "

## 2022-03-31 NOTE — Assessment & Plan Note (Addendum)
BP Readings from Last 3 Encounters:  03/31/22 113/77  02/21/22 120/74  01/27/22 115/79  Chronic condition well-controlled Currently on acebutolol 200 mg twice daily ,lisinopril-hydrochlorothiazide 20-25 1 tablet daily, spironolactone 50 mg daily,Verapamil to 40 mg daily at bedtime.  Takes torsemide 20 mg as needed for swelling. Continue current medications DASH diet advised engage in regular walking exercise daily  TEST CHEM 7 Coll. date: 03/03/22 @ 11:07 TEST RESULT UNITS RANGE UREA NITROGEN 21 mg/dL 5-23 CREATININE,PLASMA (in mg/dL) 1.330 mg/dL 0.6-1.4 SODIUM 135 mmol/L 134.0-146.0 POTASSIUM 4.2 mmol/L 3.4-5.0 CHLORIDE 107 mmol/L 99-109 CO2 24 mmol/L 22.0-32.0 CALCIUM 9.4 mg/dL 8.5-10.5 GLUCOSE 197 H mg/dL 74-118   COLLECTION DATE RESULT UNITS RANGE  SPECIMEN eGFR_CKD 03/03/22 @ 1107 55 L mL/min 60-"Variab PLASMA Normal or Stable - "Your results are stable."

## 2022-03-31 NOTE — Assessment & Plan Note (Signed)
Patient told to stop taking vitamin D 50,000 units since his vitamin D level is normal.

## 2022-04-05 ENCOUNTER — Ambulatory Visit (INDEPENDENT_AMBULATORY_CARE_PROVIDER_SITE_OTHER): Payer: PPO | Admitting: Physician Assistant

## 2022-04-05 DIAGNOSIS — E291 Testicular hypofunction: Secondary | ICD-10-CM | POA: Diagnosis not present

## 2022-04-05 MED ORDER — TESTOSTERONE CYPIONATE 200 MG/ML IM SOLN
100.0000 mg | Freq: Once | INTRAMUSCULAR | Status: AC
  Administered 2022-04-05: 100 mg via INTRAMUSCULAR

## 2022-04-05 NOTE — Progress Notes (Signed)
Testosterone IM Injection  Due to Hypogonadism patient is present today for a Testosterone Injection.  Medication: Testosterone Cypionate Dose: '100mg'$  Location: Right outer thigh Lot: [id number] Exp:09/02/2023  Patient tolerated well, no complications were noted  Performed by: Levi Aland, CMA  Follow up: Follow up in 1 week for next injection.

## 2022-04-05 NOTE — Addendum Note (Signed)
Addended by: Audie Box on: 04/05/2022 11:47 AM   Modules accepted: Level of Service

## 2022-04-12 ENCOUNTER — Ambulatory Visit (INDEPENDENT_AMBULATORY_CARE_PROVIDER_SITE_OTHER): Payer: PPO | Admitting: Physician Assistant

## 2022-04-12 DIAGNOSIS — E291 Testicular hypofunction: Secondary | ICD-10-CM | POA: Diagnosis not present

## 2022-04-12 MED ORDER — TESTOSTERONE CYPIONATE 200 MG/ML IM SOLN
100.0000 mg | Freq: Once | INTRAMUSCULAR | Status: AC
  Administered 2022-04-12: 100 mg via INTRAMUSCULAR

## 2022-04-12 NOTE — Progress Notes (Signed)
Testosterone IM Injection  Due to Hypogonadism patient is present today for a Testosterone Injection.  Medication: Testosterone Cypionate Dose: 100  Location: left upper outer buttocks Lot: [id number]  Patient tolerated well, no complications were noted  Performed by: Estill Bamberg RN  Follow up: 1 week NV testosterone

## 2022-04-14 ENCOUNTER — Other Ambulatory Visit: Payer: PPO

## 2022-04-14 ENCOUNTER — Other Ambulatory Visit: Payer: Self-pay | Admitting: Cardiology

## 2022-04-14 DIAGNOSIS — I10 Essential (primary) hypertension: Secondary | ICD-10-CM

## 2022-04-14 DIAGNOSIS — E291 Testicular hypofunction: Secondary | ICD-10-CM

## 2022-04-15 LAB — PSA: Prostate Specific Ag, Serum: 1 ng/mL (ref 0.0–4.0)

## 2022-04-15 LAB — TESTOSTERONE: Testosterone: 835 ng/dL (ref 264–916)

## 2022-04-18 ENCOUNTER — Ambulatory Visit (INDEPENDENT_AMBULATORY_CARE_PROVIDER_SITE_OTHER): Payer: PPO | Admitting: Urology

## 2022-04-18 VITALS — BP 143/87 | HR 86 | Ht 68.0 in | Wt 258.0 lb

## 2022-04-18 DIAGNOSIS — E291 Testicular hypofunction: Secondary | ICD-10-CM | POA: Diagnosis not present

## 2022-04-18 DIAGNOSIS — N5201 Erectile dysfunction due to arterial insufficiency: Secondary | ICD-10-CM

## 2022-04-18 LAB — URINALYSIS, ROUTINE W REFLEX MICROSCOPIC
Bilirubin, UA: NEGATIVE
Ketones, UA: NEGATIVE
Leukocytes,UA: NEGATIVE
Nitrite, UA: NEGATIVE
Protein,UA: NEGATIVE
RBC, UA: NEGATIVE
Specific Gravity, UA: <1.005 — ABNORMAL LOW (ref 1.005–1.030)
Urobilinogen, Ur: 0.2 mg/dL (ref 0.2–1.0)
pH, UA: 5 (ref 5.0–7.5)

## 2022-04-18 MED ORDER — TESTOSTERONE CYPIONATE 200 MG/ML IM SOLN
200.0000 mg | Freq: Once | INTRAMUSCULAR | Status: AC
  Administered 2022-04-18: 200 mg via INTRAMUSCULAR

## 2022-04-18 NOTE — Progress Notes (Addendum)
History of Present Illness: Omar [name] is here today to follow-up low testosterone.  He was begun on testosterone injections with testosterone cypionate 100 mg IM weekly.  He feels fine with this.  His most recent testosterone level was 835.  PSA 1.0.  He comes into the office for these injections.  It does not bother him, but this is not so convenient for our staff.  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 Merlene Laughter interpreted the study-CPAP recommended, but refused by patient   Stroke [facility])    no deficits from stroke, "Didnt know I had one when they say I did".    Past Surgical History:  Procedure Laterality Date   BIOPSY  04/16/2019   Procedure: BIOPSY;  Surgeon: Danie Binder, MD;  [facility];  Service: Endoscopy;;   CATARACT EXTRACTION W/PHACO Left 04/03/2016   Procedure: CATARACT EXTRACTION PHACO AND INTRAOCULAR LENS PLACEMENT LEFT EYE CDE=21.76;  Surgeon: Williams Che, MD;  Location: [facility] OR[facility];  Service: Ophthalmology;  Laterality: Left;   CATARACT EXTRACTION W/PHACO Right 06/19/2016   Procedure: CATARACT EXTRACTION PHACO AND INTRAOCULAR LENS PLACEMENT; CDE:  11.56;  Surgeon: Williams Che, MD;  Location: [facility];  Service: Ophthalmology;   Laterality: Right;   COLONOSCOPY N/A 04/16/2019   Procedure: COLONOSCOPY;  Surgeon: Danie Binder, MD;  [facility];  Service: Endoscopy;  Laterality: N/A;  8:30am   COLONOSCOPY W/ POLYPECTOMY  05/2008   polypectomy x4-adenomatous; internal hemorrhoids   CORONARY ANGIOPLASTY WITH STENT PLACEMENT  06/04/2012     DES    to [place]     x 1   DUPUYTREN / PALMAR FASCIOTOMY  2009   rt hand-dsc-went home same day x2   ESOPHAGOGASTRODUODENOSCOPY N/A 04/16/2019   Procedure: ESOPHAGOGASTRODUODENOSCOPY (EGD);  Surgeon: Danie Binder, MD;  [facility];  Service: Endoscopy;  Laterality: N/A;   LEFT HEART CATHETERIZATION WITH CORONARY ANGIOGRAM N/A 05/28/2012   Procedure: LEFT HEART CATHETERIZATION WITH CORONARY ANGIOGRAM;  Surgeon: Laverda Page, MD;  Location: [facility] C[facility] LAB;  Service: Cardiovascular;  Laterality: N/A;   PERCUTANEOUS CORONARY STENT INTERVENTION (PCI-S) N/A 06/04/2012   Procedure: PERCUTANEOUS CORONARY STENT INTERVENTION (PCI-S);  Surgeon: Laverda Page, MD;  Location: [facility] CATH LAB;  Service: Cardiovascular;  Laterality: N/A;   POLYPECTOMY  04/16/2019   Procedure: POLYPECTOMY;  Surgeon: Danie Binder, MD;  [facility];  Service: Endoscopy;;   TONSILLECTOMY     TOTAL HIP ARTHROPLASTY Right 06/24/2019   Procedure: TOTAL HIP ARTHROPLASTY;  Surgeon: Marchia Bond, MD;  Location: [facility];  Service: Orthopedics;  Laterality: Right;   VENTRAL HERNIA REPAIR  2010   umb hernia-[facility]-went home same day    Home Medications:  Allergies as of 04/18/2022   No Known Allergies      Medication List        Accurate as of April 18, 2022  8:11 AM. If you have any questions, ask your nurse or doctor.          acebutolol 200 MG capsule Commonly known as: SECTRAL TAKE (1) CAPSULE BY MOUTH TWICE DAILY.   apixaban 5 MG Tabs tablet Commonly known as: ELIQUIS Take 5 mg by mouth 2 (two) times daily.   Calcium-Vitamin D-Vitamin K  650-12.5-40 MG-MCG-MCG Chew Chew 2 tablets by mouth at bedtime.   cetirizine 10 MG tablet Commonly known as: ZyrTEC Allergy Take 1 tablet (10 mg total) by mouth daily.   cyanocobalamin 500 MCG tablet Commonly known as: CYANOCOBALAMIN Take 500 mcg by mouth daily.   empagliflozin 25 MG Tabs tablet Commonly known as: Jardiance Take 1 tablet (25 mg total) by mouth daily.   fluticasone 50 MCG/ACT nasal spray Commonly known as: FLONASE Place 1 spray into both nostrils daily for 14 days.   lisinopril-hydrochlorothiazide 20-25 MG tablet Commonly known as: ZESTORETIC Take 1 tablet by mouth daily.   metFORMIN 1000 MG tablet Commonly known as: GLUCOPHAGE TAKE (1) TABLET BY MOUTH TWICE DAILY. What changed: See the new instructions.   nitroGLYCERIN 0.4 MG SL tablet Commonly known as: NITROSTAT PLACE 1 TAB UNDER TONGUE EVERY 5 MIN IF NEEDED FOR CHEST PAIN. MAY USE 3 TIMES.NO RELIEF CALL 911.   omeprazole 20 MG capsule Commonly known as: PRILOSEC TAKE 1 CAPSULE 30 MINUTES BEFORE BREAKFAST.   ONE-A-DAY 50 PLUS PO Take 1 tablet by mouth at bedtime.   rosuvastatin 20 MG tablet Commonly known as: CRESTOR Take 1 tablet (20 mg total) by mouth daily.   spironolactone 50 MG tablet Commonly known as: Aldactone Take 1 tablet (50 mg total) by mouth every morning.   tamsulosin 0.4 MG Caps capsule Commonly known as: FLOMAX Take 0.4 mg by mouth daily.   testosterone cypionate 200 MG/ML injection Commonly known as: DEPOTESTOSTERONE CYPIONATE Inject 0.5 mLs (100 mg total) into the muscle every 7 (seven) days.   torsemide 20 MG tablet Commonly known as: DEMADEX Take 20 mg by mouth daily as needed (swelling).   verapamil 240 MG CR tablet Commonly known as: CALAN-SR Take 1 tablet (240 mg total) by mouth at bedtime.        Allergies: No Known Allergies  Family History  Problem Relation Age of Onset   Cancer Mother    Brain cancer Mother    Cancer Father    Prostate cancer  Father    Breast cancer Daughter    Thyroid cancer Daughter    Coronary artery disease Neg Hx    Colon cancer Neg Hx     Social History:  reports that he has never smoked. He has never used smokeless tobacco. He reports that he does not drink alcohol and does not use drugs.  ROS: A complete review of systems was performed.  All systems are negative except for pertinent findings as noted.  Physical Exam:  Vital signs in last 24 hours: There were no vitals taken for this visit. Constitutional:  Alert and oriented, No acute distress Cardiovascular: Regular rate  Respiratory: Normal respiratory effort Neurologic: Grossly intact, no focal deficits Psychiatric: Normal mood and affect  I have reviewed prior pt notes  I have reviewed recent  PSA and testosterone results   Impression/Assessment:  Low testosterone, back on repletion with adequate levels.  Plan:  1.  I will change him to testosterone cypionate 1 mL IM Q O week here in our office rather than half that amount every week  2.  I will see back in 6 months with laboratories

## 2022-04-18 NOTE — Progress Notes (Signed)
Testosterone IM Injection  Due to Hypogonadism patient is present today for a Testosterone Injection.  Medication: Testosterone Cypionate Dose: '200mg'$  Location: right upper outer buttocks Lot: [id number] Exp:09/02/2023  Patient tolerated well, no complications were noted  Performed by: Levi Aland, CMA  Follow up: Follow up in 2 weeks for NV injection per MD.  '200mg'$  given at this ap per MD.

## 2022-04-28 ENCOUNTER — Other Ambulatory Visit: Payer: Self-pay

## 2022-05-03 ENCOUNTER — Ambulatory Visit (INDEPENDENT_AMBULATORY_CARE_PROVIDER_SITE_OTHER): Payer: PPO | Admitting: Urology

## 2022-05-03 DIAGNOSIS — E291 Testicular hypofunction: Secondary | ICD-10-CM

## 2022-05-03 MED ORDER — TESTOSTERONE CYPIONATE 200 MG/ML IM SOLN
200.0000 mg | Freq: Once | INTRAMUSCULAR | Status: AC
  Administered 2022-05-03: 200 mg via INTRAMUSCULAR

## 2022-05-03 NOTE — Progress Notes (Signed)
Testosterone IM Injection  Due to Hypogonadism patient is present today for a Testosterone Injection.  Medication: Testosterone Cypionate Dose: '200mg'$  Location: left upper outer buttocks Lot: [id number] Exp:09/02/2023  Patient tolerated well, no complications were noted  Performed by: Levi Aland, CMA  Follow up: Follow up in 2 weeks for next injection.

## 2022-05-10 ENCOUNTER — Ambulatory Visit: Payer: PPO | Admitting: Physician Assistant

## 2022-05-17 ENCOUNTER — Ambulatory Visit (INDEPENDENT_AMBULATORY_CARE_PROVIDER_SITE_OTHER): Payer: PPO | Admitting: Physician Assistant

## 2022-05-17 DIAGNOSIS — E291 Testicular hypofunction: Secondary | ICD-10-CM | POA: Diagnosis not present

## 2022-05-17 MED ORDER — TESTOSTERONE CYPIONATE 200 MG/ML IM SOLN
200.0000 mg | Freq: Once | INTRAMUSCULAR | Status: AC
  Administered 2022-05-17: 200 mg via INTRAMUSCULAR

## 2022-05-17 NOTE — Progress Notes (Signed)
Testosterone IM Injection  Due to Hypogonadism patient is present today for a Testosterone Injection.  Medication: Testosterone Cypionate Dose: '200mg'$  Location: right upper outer buttocks Lot: [id number] Exp:09/2023  Patient tolerated well, no complications were noted  Performed by: Levi Aland, CMA  Follow up: Follow up in 2 weeks as scheduled.

## 2022-05-18 ENCOUNTER — Other Ambulatory Visit: Payer: Self-pay | Admitting: Cardiology

## 2022-05-18 DIAGNOSIS — I10 Essential (primary) hypertension: Secondary | ICD-10-CM

## 2022-05-19 ENCOUNTER — Emergency Department (HOSPITAL_COMMUNITY): Payer: PPO

## 2022-05-19 ENCOUNTER — Inpatient Hospital Stay (HOSPITAL_COMMUNITY)
Admission: EM | Admit: 2022-05-19 | Discharge: 2022-05-21 | DRG: 872 | Disposition: A | Discharge status: Home / Self Care | Payer: PPO | Attending: Family Medicine | Admitting: Family Medicine

## 2022-05-19 ENCOUNTER — Encounter (HOSPITAL_COMMUNITY): Payer: Self-pay | Admitting: Emergency Medicine

## 2022-05-19 DIAGNOSIS — Z803 Family history of malignant neoplasm of breast: Secondary | ICD-10-CM

## 2022-05-19 DIAGNOSIS — E1142 Type 2 diabetes mellitus with diabetic polyneuropathy: Secondary | ICD-10-CM | POA: Diagnosis present

## 2022-05-19 DIAGNOSIS — Z8673 Personal history of transient ischemic attack (TIA), and cerebral infarction without residual deficits: Secondary | ICD-10-CM | POA: Diagnosis not present

## 2022-05-19 DIAGNOSIS — N390 Urinary tract infection, site not specified: Secondary | ICD-10-CM | POA: Diagnosis present

## 2022-05-19 DIAGNOSIS — D6851 Activated protein C resistance: Secondary | ICD-10-CM | POA: Diagnosis present

## 2022-05-19 DIAGNOSIS — N179 Acute kidney failure, unspecified: Secondary | ICD-10-CM | POA: Diagnosis present

## 2022-05-19 DIAGNOSIS — E871 Hypo-osmolality and hyponatremia: Secondary | ICD-10-CM | POA: Diagnosis present

## 2022-05-19 DIAGNOSIS — Z6839 Body mass index (BMI) 39.0-39.9, adult: Secondary | ICD-10-CM

## 2022-05-19 DIAGNOSIS — I82409 Acute embolism and thrombosis of unspecified deep veins of unspecified lower extremity: Secondary | ICD-10-CM | POA: Diagnosis present

## 2022-05-19 DIAGNOSIS — A419 Sepsis, unspecified organism: Principal | ICD-10-CM | POA: Diagnosis present

## 2022-05-19 DIAGNOSIS — Z79899 Other long term (current) drug therapy: Secondary | ICD-10-CM | POA: Diagnosis not present

## 2022-05-19 DIAGNOSIS — Z86718 Personal history of other venous thrombosis and embolism: Secondary | ICD-10-CM | POA: Diagnosis not present

## 2022-05-19 DIAGNOSIS — K760 Fatty (change of) liver, not elsewhere classified: Secondary | ICD-10-CM | POA: Diagnosis present

## 2022-05-19 DIAGNOSIS — Z86011 Personal history of benign neoplasm of the brain: Secondary | ICD-10-CM

## 2022-05-19 DIAGNOSIS — E1159 Type 2 diabetes mellitus with other circulatory complications: Secondary | ICD-10-CM | POA: Diagnosis present

## 2022-05-19 DIAGNOSIS — I10 Essential (primary) hypertension: Secondary | ICD-10-CM | POA: Diagnosis present

## 2022-05-19 DIAGNOSIS — Z808 Family history of malignant neoplasm of other organs or systems: Secondary | ICD-10-CM | POA: Diagnosis not present

## 2022-05-19 DIAGNOSIS — E782 Mixed hyperlipidemia: Secondary | ICD-10-CM | POA: Diagnosis present

## 2022-05-19 DIAGNOSIS — Z7984 Long term (current) use of oral hypoglycemic drugs: Secondary | ICD-10-CM | POA: Diagnosis not present

## 2022-05-19 DIAGNOSIS — I2510 Atherosclerotic heart disease of native coronary artery without angina pectoris: Secondary | ICD-10-CM | POA: Diagnosis present

## 2022-05-19 DIAGNOSIS — N400 Enlarged prostate without lower urinary tract symptoms: Secondary | ICD-10-CM | POA: Diagnosis present

## 2022-05-19 DIAGNOSIS — R531 Weakness: Principal | ICD-10-CM

## 2022-05-19 DIAGNOSIS — Z8042 Family history of malignant neoplasm of prostate: Secondary | ICD-10-CM

## 2022-05-19 DIAGNOSIS — Z20822 Contact with and (suspected) exposure to covid-19: Secondary | ICD-10-CM | POA: Diagnosis present

## 2022-05-19 DIAGNOSIS — E291 Testicular hypofunction: Secondary | ICD-10-CM | POA: Diagnosis present

## 2022-05-19 DIAGNOSIS — E860 Dehydration: Secondary | ICD-10-CM

## 2022-05-19 DIAGNOSIS — Z7901 Long term (current) use of anticoagulants: Secondary | ICD-10-CM

## 2022-05-19 DIAGNOSIS — I959 Hypotension, unspecified: Secondary | ICD-10-CM | POA: Diagnosis present

## 2022-05-19 DIAGNOSIS — Z955 Presence of coronary angioplasty implant and graft: Secondary | ICD-10-CM

## 2022-05-19 LAB — CBC WITH DIFFERENTIAL/PLATELET
Abs Immature Granulocytes: 0.09 10*3/uL — ABNORMAL HIGH (ref 0.00–0.07)
Basophils Absolute: 0.1 10*3/uL (ref 0.0–0.1)
Basophils Relative: 1 %
Eosinophils Absolute: 0 10*3/uL (ref 0.0–0.5)
Eosinophils Relative: 0 %
HCT: 46.9 % (ref 39.0–52.0)
Hemoglobin: 15.7 g/dL (ref 13.0–17.0)
Immature Granulocytes: 1 %
Lymphocytes Relative: 7 %
Lymphs Abs: 0.8 10*3/uL (ref 0.7–4.0)
MCH: 30 pg (ref 26.0–34.0)
MCHC: 33.5 g/dL (ref 30.0–36.0)
MCV: 89.5 fL (ref 80.0–100.0)
Monocytes Absolute: 1.1 10*3/uL — ABNORMAL HIGH (ref 0.1–1.0)
Monocytes Relative: 10 %
Neutro Abs: 9.5 10*3/uL — ABNORMAL HIGH (ref 1.7–7.7)
Neutrophils Relative %: 81 %
Platelets: 218 10*3/uL (ref 150–400)
RBC: 5.24 MIL/uL (ref 4.22–5.81)
RDW: 14.3 % (ref 11.5–15.5)
WBC: 11.6 10*3/uL — ABNORMAL HIGH (ref 4.0–10.5)
nRBC: 0 % (ref 0.0–0.2)

## 2022-05-19 LAB — URINALYSIS, ROUTINE W REFLEX MICROSCOPIC
Bilirubin Urine: NEGATIVE
Glucose, UA: >=500 mg/dL — AB
Ketones, ur: NEGATIVE mg/dL
Nitrite: NEGATIVE
Protein, ur: NEGATIVE mg/dL
Specific Gravity, Urine: 1.022 (ref 1.005–1.030)
pH: 5 (ref 5.0–8.0)

## 2022-05-19 LAB — COMPREHENSIVE METABOLIC PANEL
ALT: 15 U/L (ref 0–44)
AST: 20 U/L (ref 15–41)
Albumin: 3.6 g/dL (ref 3.5–5.0)
Alkaline Phosphatase: 48 U/L (ref 38–126)
Anion gap: 13 (ref 5–15)
BUN: 35 mg/dL — ABNORMAL HIGH (ref 8–23)
CO2: 19 mmol/L — ABNORMAL LOW (ref 22–32)
Calcium: 8.9 mg/dL (ref 8.9–10.3)
Chloride: 98 mmol/L (ref 98–111)
Creatinine, Ser: 2.24 mg/dL — ABNORMAL HIGH (ref 0.61–1.24)
GFR, Estimated: 29 mL/min — ABNORMAL LOW (ref >60)
Glucose, Bld: 222 mg/dL — ABNORMAL HIGH (ref 70–99)
Potassium: 4.8 mmol/L (ref 3.5–5.1)
Sodium: 130 mmol/L — ABNORMAL LOW (ref 135–145)
Total Bilirubin: 0.6 mg/dL (ref 0.3–1.2)
Total Protein: 6.9 g/dL (ref 6.5–8.1)

## 2022-05-19 LAB — CBG MONITORING, ED: Glucose-Capillary: 244 mg/dL — ABNORMAL HIGH (ref 70–99)

## 2022-05-19 LAB — TROPONIN I (HIGH SENSITIVITY)
Troponin I (High Sensitivity): 22 ng/L — ABNORMAL HIGH (ref <18)
Troponin I (High Sensitivity): 19 ng/L — ABNORMAL HIGH (ref <18)

## 2022-05-19 LAB — PROTIME-INR
INR: 1.7 — ABNORMAL HIGH (ref 0.8–1.2)
Prothrombin Time: 19.8 seconds — ABNORMAL HIGH (ref 11.4–15.2)

## 2022-05-19 LAB — LACTIC ACID, PLASMA
Lactic Acid, Venous: 1.5 mmol/L (ref 0.5–1.9)
Lactic Acid, Venous: 1.2 mmol/L (ref 0.5–1.9)

## 2022-05-19 LAB — GLUCOSE, CAPILLARY
Glucose-Capillary: 178 mg/dL — ABNORMAL HIGH (ref 70–99)
Glucose-Capillary: 213 mg/dL — ABNORMAL HIGH (ref 70–99)

## 2022-05-19 LAB — HEMOGLOBIN A1C
Hgb A1c MFr Bld: 7.5 % — ABNORMAL HIGH (ref 4.8–5.6)
Mean Plasma Glucose: 168.55 mg/dL

## 2022-05-19 LAB — APTT: aPTT: 33 seconds (ref 24–36)

## 2022-05-19 LAB — SARS CORONAVIRUS 2 BY RT PCR: SARS Coronavirus 2 by RT PCR: NEGATIVE

## 2022-05-19 MED ORDER — LACTATED RINGERS IV SOLN: INTRAVENOUS | Status: AC

## 2022-05-19 MED ORDER — SENNOSIDES-DOCUSATE SODIUM 8.6-50 MG PO TABS: 1.0000 | ORAL_TABLET | Freq: Every evening | ORAL | Status: DC | PRN

## 2022-05-19 MED ORDER — LEVALBUTEROL HCL 0.63 MG/3ML IN NEBU: 0.6300 mg | INHALATION_SOLUTION | Freq: Four times a day (QID) | RESPIRATORY_TRACT | Status: DC | PRN

## 2022-05-19 MED ORDER — SODIUM CHLORIDE 0.9 % IV SOLN: INTRAVENOUS | Status: DC

## 2022-05-19 MED ORDER — SODIUM CHLORIDE 0.9 % IV BOLUS
1000.0000 mL | Freq: Once | INTRAVENOUS | Status: AC
Start: 2022-05-19 — End: 2022-05-19
  Administered 2022-05-19: 1000 mL via INTRAVENOUS

## 2022-05-19 MED ORDER — INSULIN ASPART 100 UNIT/ML IJ SOLN
0.0000 [IU] | Freq: Three times a day (TID) | INTRAMUSCULAR | Status: DC
  Administered 2022-05-19: 1 [IU] via SUBCUTANEOUS
  Administered 2022-05-20: 3 [IU] via SUBCUTANEOUS
  Administered 2022-05-20: 2 [IU] via SUBCUTANEOUS

## 2022-05-19 MED ORDER — LACTATED RINGERS IV BOLUS (SEPSIS)
1000.0000 mL | Freq: Once | INTRAVENOUS | Status: AC
Start: 2022-05-19 — End: 2022-05-19
  Administered 2022-05-19: 1000 mL via INTRAVENOUS

## 2022-05-19 MED ORDER — TAMSULOSIN HCL 0.4 MG PO CAPS
0.4000 mg | ORAL_CAPSULE | Freq: Every day | ORAL | Status: DC
  Administered 2022-05-19 – 2022-05-21 (×3): 0.4 mg via ORAL
  Filled 2022-05-19 (×3): qty 1

## 2022-05-19 MED ORDER — ONDANSETRON HCL 4 MG PO TABS: 4.0000 mg | ORAL_TABLET | Freq: Four times a day (QID) | ORAL | Status: DC | PRN

## 2022-05-19 MED ORDER — SODIUM CHLORIDE 0.9 % IV SOLN: 2.0000 g | Freq: Once | INTRAVENOUS | Status: DC

## 2022-05-19 MED ORDER — VANCOMYCIN HCL 2000 MG/400ML IV SOLN
2000.0000 mg | Freq: Once | INTRAVENOUS | Status: AC
  Administered 2022-05-19: 2000 mg via INTRAVENOUS
  Filled 2022-05-19: qty 400

## 2022-05-19 MED ORDER — LACTATED RINGERS IV BOLUS (SEPSIS)
1000.0000 mL | Freq: Once | INTRAVENOUS | Status: AC
  Administered 2022-05-19: 1000 mL via INTRAVENOUS

## 2022-05-19 MED ORDER — VANCOMYCIN HCL IN DEXTROSE 1-5 GM/200ML-% IV SOLN
1000.0000 mg | Freq: Once | INTRAVENOUS | Status: DC
  Filled 2022-05-19: qty 200

## 2022-05-19 MED ORDER — ACETAMINOPHEN 650 MG RE SUPP: 650.0000 mg | Freq: Four times a day (QID) | RECTAL | Status: DC | PRN

## 2022-05-19 MED ORDER — SODIUM CHLORIDE 0.9 % IV SOLN: 250.0000 mL | INTRAVENOUS | Status: DC | PRN

## 2022-05-19 MED ORDER — SODIUM CHLORIDE 0.9% FLUSH: 3.0000 mL | INTRAVENOUS | Status: DC | PRN

## 2022-05-19 MED ORDER — SODIUM CHLORIDE 0.9 % IV SOLN
2.0000 g | INTRAVENOUS | Status: DC
  Administered 2022-05-20 – 2022-05-21 (×2): 2 g via INTRAVENOUS
  Filled 2022-05-19 (×2): qty 20

## 2022-05-19 MED ORDER — ACETAMINOPHEN 325 MG PO TABS
650.0000 mg | ORAL_TABLET | Freq: Four times a day (QID) | ORAL | Status: DC | PRN
  Administered 2022-05-19: 650 mg via ORAL
  Filled 2022-05-19: qty 2

## 2022-05-19 MED ORDER — HYDROMORPHONE HCL 1 MG/ML IJ SOLN: 0.5000 mg | INTRAMUSCULAR | Status: DC | PRN

## 2022-05-19 MED ORDER — LACTATED RINGERS IV BOLUS (SEPSIS): 1000.0000 mL | Freq: Once | INTRAVENOUS | Status: DC

## 2022-05-19 MED ORDER — HEPARIN SODIUM (PORCINE) 5000 UNIT/ML IJ SOLN: 5000.0000 [IU] | Freq: Three times a day (TID) | INTRAMUSCULAR | Status: DC

## 2022-05-19 MED ORDER — ROSUVASTATIN CALCIUM 20 MG PO TABS
20.0000 mg | ORAL_TABLET | Freq: Every day | ORAL | Status: DC
  Administered 2022-05-19 – 2022-05-21 (×3): 20 mg via ORAL
  Filled 2022-05-19 (×3): qty 1

## 2022-05-19 MED ORDER — SODIUM CHLORIDE 0.9 % IV SOLN
2.0000 g | Freq: Once | INTRAVENOUS | Status: AC
  Administered 2022-05-19: 2 g via INTRAVENOUS
  Filled 2022-05-19: qty 20

## 2022-05-19 MED ORDER — SODIUM CHLORIDE 0.9% FLUSH
3.0000 mL | Freq: Two times a day (BID) | INTRAVENOUS | Status: DC
  Administered 2022-05-19 – 2022-05-20 (×3): 3 mL via INTRAVENOUS

## 2022-05-19 MED ORDER — APIXABAN 5 MG PO TABS
5.0000 mg | ORAL_TABLET | Freq: Two times a day (BID) | ORAL | Status: DC
  Administered 2022-05-19 – 2022-05-21 (×5): 5 mg via ORAL
  Filled 2022-05-19 (×5): qty 1

## 2022-05-19 MED ORDER — ACEBUTOLOL HCL 200 MG PO CAPS
200.0000 mg | ORAL_CAPSULE | Freq: Two times a day (BID) | ORAL | Status: DC
  Administered 2022-05-20 – 2022-05-21 (×3): 200 mg via ORAL
  Filled 2022-05-19 (×7): qty 1

## 2022-05-19 MED ORDER — SODIUM CHLORIDE 0.9 % IV BOLUS
1000.0000 mL | Freq: Once | INTRAVENOUS | Status: AC
  Administered 2022-05-19: 1000 mL via INTRAVENOUS

## 2022-05-19 MED ORDER — OXYCODONE HCL 5 MG PO TABS: 5.0000 mg | ORAL_TABLET | ORAL | Status: DC | PRN

## 2022-05-19 MED ORDER — ONDANSETRON HCL 4 MG/2ML IJ SOLN: 4.0000 mg | Freq: Four times a day (QID) | INTRAMUSCULAR | Status: DC | PRN

## 2022-05-19 MED ORDER — BISACODYL 5 MG PO TBEC: 5.0000 mg | DELAYED_RELEASE_TABLET | Freq: Every day | ORAL | Status: DC | PRN

## 2022-05-19 MED ORDER — SODIUM CHLORIDE 0.9% FLUSH
3.0000 mL | Freq: Two times a day (BID) | INTRAVENOUS | Status: DC
  Administered 2022-05-19 – 2022-05-21 (×4): 3 mL via INTRAVENOUS

## 2022-05-19 MED ORDER — HYDRALAZINE HCL 20 MG/ML IJ SOLN: 10.0000 mg | INTRAMUSCULAR | Status: DC | PRN

## 2022-05-19 MED ORDER — FLEET ENEMA 7-19 GM/118ML RE ENEM: 1.0000 | ENEMA | Freq: Once | RECTAL | Status: DC | PRN

## 2022-05-19 MED ORDER — NITROGLYCERIN 0.4 MG SL SUBL: 0.4000 mg | SUBLINGUAL_TABLET | SUBLINGUAL | Status: DC | PRN

## 2022-05-19 MED ORDER — IPRATROPIUM BROMIDE 0.02 % IN SOLN
0.5000 mg | Freq: Four times a day (QID) | RESPIRATORY_TRACT | Status: DC | PRN
Start: 2022-05-19 — End: 2022-05-21

## 2022-05-19 MED ORDER — TRAZODONE HCL 50 MG PO TABS
25.0000 mg | ORAL_TABLET | Freq: Every evening | ORAL | Status: DC | PRN
  Administered 2022-05-20: 25 mg via ORAL
  Filled 2022-05-19: qty 1

## 2022-05-19 MED ORDER — METRONIDAZOLE 500 MG/100ML IV SOLN
500.0000 mg | Freq: Once | INTRAVENOUS | Status: AC
  Administered 2022-05-19: 500 mg via INTRAVENOUS
  Filled 2022-05-19: qty 100

## 2022-05-19 MED ORDER — PANTOPRAZOLE SODIUM 40 MG PO TBEC
40.0000 mg | DELAYED_RELEASE_TABLET | Freq: Every day | ORAL | Status: DC
  Administered 2022-05-19 – 2022-05-21 (×3): 40 mg via ORAL
  Filled 2022-05-19 (×3): qty 1

## 2022-05-19 NOTE — Assessment & Plan Note (Addendum)
-  Stable, denies chest pain -Was Holding home medication of calcium channel blockers due to hypotension, resume Eliquis -patient is not on any beta-blockers

## 2022-05-19 NOTE — Hospital Course (Signed)
Omar [name] is a 77 year old male with history of DM 2, HTN, HLD, DVT, factor IV Leiden deficiency, CAD, presented to the ED with chief complaint of generalized weakness, malaise.  Patient reports his symptoms started several days ago and progressively getting worse apparently had recent left hand surgery completed course of antibiotics for wound infection.  Reporting of increased frequency urination, burning with no discharge, malaise.  Per patient had a Tmax of 100.6 last night.    ED course: Upon arrival Tmax 99.6, RR 28-22, BP 87/55, satting 96% on room air Current vitals; Blood pressure 94/66, pulse 68, temperature 99.3 F (37.4 C), temperature source Oral, resp. rate (!) 22, height '5\' 8"'$  (1.727 m), weight 117 kg, SpO2 96 %. WBC 11.6 BUN 35, creatinine 2.24   Lactic acid 1.2, troponin 19,   UA: Cloudy, leukocyte esterase, WBC 11-20,  Patient thought to be septic with a urinary source--low-grade fever, hypotensive, tachypneic, with AKI, mild hyponatremia,  Blood and urine cultures are collected, IV fluid and broad-spectrum antibiotic was initiated  Requested patient to be admitted for sepsis

## 2022-05-19 NOTE — Assessment & Plan Note (Signed)
-   was  hypotensive, with holding home medication of verapamil, lisinopril-HCTZ, torsemide and spironolactone -Trend BP closely, reinitiating medicine as BP improves

## 2022-05-19 NOTE — Assessment & Plan Note (Signed)
-   May resume testosterone supplement q. 14 days -On hold for now

## 2022-05-19 NOTE — Evaluation (Signed)
Physical Therapy Evaluation Patient Details Name: Omar [name] MRN: [id number] DOB: Jul 30, 1946 Today's Date: 05/19/2022  History of Present Illness  Omar [name] is a 76 year old male with history of DM 2, HTN, HLD, DVT, factor IV Leiden deficiency, CAD, presented to the ED with chief complaint of generalized weakness, malaise.     Patient reports his symptoms started several days ago and progressively getting worse apparently had recent left hand surgery completed course of antibiotics for wound infection.  Reporting of increased frequency urination, burning with no discharge, malaise.  Per patient had a Tmax of 100.6 last night.   Clinical Impression  Patient functioning near baseline for functional mobility and gait other than having to occasionally lean on side rails in hallway during ambulation, no loss of balance and demonstrates good return for bed mobility, transfers and walking in room without problem.  Patient encouraged to ambulate ad lib as tolerated for length of stay - nursing staff notified.  Plan:  Patient discharged from physical therapy to care of nursing for ambulation daily as tolerated for length of stay.         Recommendations for follow up therapy are one component of a multi-disciplinary discharge planning process, led by the attending physician.  Recommendations may be updated based on patient status, additional functional criteria and insurance authorization.  Follow Up Recommendations No PT follow up      Assistance Recommended at Discharge PRN  Patient can return home with the following  Help with stairs or ramp for entrance;Assistance with cooking/housework    Equipment Recommendations None recommended by PT  Recommendations for Other Services       Functional Status Assessment Patient has had a recent decline in their functional status and/or demonstrates limited ability to make significant improvements in function in a reasonable and predictable amount  of time     Precautions / Restrictions Precautions Precautions: None Restrictions Weight Bearing Restrictions: No      Mobility  Bed Mobility Overal bed mobility: Modified Independent                  Transfers Overall transfer level: Modified independent                      Ambulation/Gait Ambulation/Gait assistance: Modified independent (Device/Increase time) Gait Distance (Feet): 120 Feet Assistive device: None Gait Pattern/deviations: Decreased step length - right, Decreased step length - left, Decreased stride length Gait velocity: decreased     General Gait Details: slightly labored cadence with occasional leaning on side rails for support, no loss of balance  Stairs            Wheelchair Mobility    Modified Rankin (Stroke Patients Only)       Balance Overall balance assessment: Mild deficits observed, not formally tested                                           Pertinent Vitals/Pain Pain Assessment Pain Assessment: No/denies pain    Home Living Family/patient expects to be discharged to:: Private residence Living Arrangements: Spouse/significant other Available Help at Discharge: Family;Available 24 hours/day Type of Home: House Home Access: Stairs to enter Entrance Stairs-Rails: Right;Left;Can reach both Entrance Stairs-Number of Steps: 6-8   Home Layout: One level Home Equipment: Conservation officer, nature (2 wheels);Cane - single point      Prior Function Prior Level  of Function : Independent/Modified Independent             Mobility Comments: Hydrographic surveyor, drives ADLs Comments: Indpendent     Hand Dominance        Extremity/Trunk Assessment   Upper Extremity Assessment Upper Extremity Assessment: Overall WFL for tasks assessed    Lower Extremity Assessment Lower Extremity Assessment: Overall WFL for tasks assessed    Cervical / Trunk Assessment Cervical / Trunk Assessment: Normal   Communication   Communication: No difficulties  Cognition Arousal/Alertness: Awake/alert Behavior During Therapy: WFL for tasks assessed/performed Overall Cognitive Status: Within Functional Limits for tasks assessed                                          General Comments      Exercises     Assessment/Plan    PT Assessment Patient does not need any further PT services  PT Problem List         PT Treatment Interventions      PT Goals (Current goals can be found in the Care Plan section)  Acute Rehab PT Goals Patient Stated Goal: return home with family to assist PT Goal Formulation: With patient Time For Goal Achievement: 05/19/22 Potential to Achieve Goals: Good    Frequency       Co-evaluation               AM-PAC PT "6 Clicks" Mobility  Outcome Measure Help needed turning from your back to your side while in a flat bed without using bedrails?: None Help needed moving from lying on your back to sitting on the side of a flat bed without using bedrails?: None Help needed moving to and from a bed to a chair (including a wheelchair)?: None Help needed standing up from a chair using your arms (e.g., wheelchair or bedside chair)?: None Help needed to walk in hospital room?: A Little Help needed climbing 3-5 steps with a railing? : A Little 6 Click Score: 22    End of Session   Activity Tolerance: Patient tolerated treatment well;Patient limited by fatigue Patient left: in chair;with call bell/phone within reach Nurse Communication: Mobility status PT Visit Diagnosis: Unsteadiness on feet (R26.81);Other abnormalities of gait and mobility (R26.89);Muscle weakness (generalized) (M62.81)    Time: 8469-6295 PT Time Calculation (min) (ACUTE ONLY): 14 min   Charges:   PT Evaluation $PT Eval Low Complexity: 1 Low PT Treatments $Therapeutic Activity: 8-22 mins        3:55 PM, 05/19/22 Lonell Grandchild, MPT Physical Therapist with  [facility] [phone] office [phone] mobile phone

## 2022-05-19 NOTE — Assessment & Plan Note (Signed)
-  stable, chronically anticoagulated with Eliquis -continued

## 2022-05-19 NOTE — ED Provider Notes (Signed)
[facility] Emergency Department Provider Note MRN:  [id number]  Arrival date & time: 05/19/22     Chief Complaint   Weakness   History of Present Illness   Omar [name] is a 76 y.o. year-old male with a history of diabetes, DVT, CAD presenting to the ED with chief complaint of weakness.  Generalized weakness for the past several days.  Recent surgery to the left hand, completed a course of antibiotics for wound infection recently.  Endorsing very frequent urination, malaise.  Stumbled and hit his head yesterday.  On blood thinners.  Fever up to 100.6 last night.  No chest pain, no cough, no abdominal pain, no diarrhea.  Review of Systems  A thorough review of systems was obtained and all systems are negative except as noted in the HPI and PMH.   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 Merlene Laughter interpreted the study-CPAP recommended, but refused by patient   Stroke [facility])    no deficits from stroke, "Didnt know I had one when they say I did".    Past Surgical History:  Procedure Laterality Date   BIOPSY  04/16/2019   Procedure: BIOPSY;  Surgeon: Danie Binder, MD;  Location: [facility];   Service: Endoscopy;;   CATARACT EXTRACTION W/PHACO Left 04/03/2016   Procedure: CATARACT EXTRACTION PHACO AND INTRAOCULAR LENS PLACEMENT LEFT EYE CDE=21.76;  Surgeon: Williams Che, MD;  Location: [facility] ORS;  Service: Ophthalmology;  Laterality: Left;   CATARACT EXTRACTION W/PHACO Right 06/19/2016   Procedure: CATARACT EXTRACTION PHACO AND INTRAOCULAR LENS PLACEMENT; CDE:  11.56;  Surgeon: Williams Che, MD;  Location: [facility];  Service: Ophthalmology;  Laterality: Right;   COLONOSCOPY N/A 04/16/2019   Procedure: COLONOSCOPY;  Surgeon: Danie Binder, MD;  [facility];  Service: Endoscopy;  Laterality: N/A;  8:30am   COLONOSCOPY W/ POLYPECTOMY  05/2008   polypectomy x4-adenomatous; internal hemorrhoids   CORONARY ANGIOPLASTY WITH STENT PLACEMENT  06/04/2012     DES    to [place]     x 1   DUPUYTREN / PALMAR FASCIOTOMY  2009   rt hand-dsc-went home same day x2   ESOPHAGOGASTRODUODENOSCOPY N/A 04/16/2019   Procedure: ESOPHAGOGASTRODUODENOSCOPY (EGD);  Surgeon: Danie Binder, MD;  [facility];  Service: Endoscopy;  Laterality: N/A;   LEFT HEART CATHETERIZATION WITH CORONARY ANGIOGRAM N/A 05/28/2012   Procedure: LEFT HEART CATHETERIZATION WITH CORONARY ANGIOGRAM;  Surgeon: Laverda Page, MD;  Location: [facility] CATH LAB;  Service: Cardiovascular;  Laterality: N/A;   PERCUTANEOUS CORONARY STENT INTERVENTION (  PCI-S) N/A 06/04/2012   Procedure: PERCUTANEOUS CORONARY STENT INTERVENTION (PCI-S);  Surgeon: Laverda Page, MD;  Location: [facility] CATH LAB;  Service: Cardiovascular;  Laterality: N/A;   POLYPECTOMY  04/16/2019   Procedure: POLYPECTOMY;  Surgeon: Danie Binder, MD;  [facility];  Service: Endoscopy;;   TONSILLECTOMY     TOTAL HIP ARTHROPLASTY Right 06/24/2019   Procedure: TOTAL HIP ARTHROPLASTY;  Surgeon: Marchia Bond, MD;  Location: [facility];  Service: Orthopedics;  Laterality: Right;   VENTRAL HERNIA REPAIR  2010   umb hernia-[facility]-went  home same day    Family History  Problem Relation Age of Onset   Cancer Mother    Brain cancer Mother    Cancer Father    Prostate cancer Father    Breast cancer Daughter    Thyroid cancer Daughter    Coronary artery disease Neg Hx    Colon cancer Neg Hx     Social History   Socioeconomic History   Marital status: Married    Spouse name: Not on file   Number of children: 2   Years of education: Not on file   Highest education level: Not on file  Occupational History   Occupation: Games developer    Comment: Research officer, trade union  Tobacco Use   Smoking status: Never   Smokeless tobacco: Never  Vaping Use   Vaping Use: Never used  Substance and Sexual Activity   Alcohol use: No   Drug use: No   Sexual activity: Not Currently    Birth control/protection: None  Other Topics Concern   Not on file  Social History Narrative   Married for 80 yrs,lives with wife.Works  part time at [employer].   Social Determinants of Health   Financial Resource Strain: Not on file  Food Insecurity: Not on file  Transportation Needs: Not on file  Physical Activity: Not on file  Stress: Not on file  Social Connections: Not on file  Intimate Partner Violence: Not on file     Physical Exam   Vitals:   05/19/22 0630 05/19/22 0647  BP: (!) 87/55 101/62  Pulse: 78 75  Resp: (!) 26 (!) 25  Temp:    SpO2: 93% 94%    CONSTITUTIONAL: Chronically ill-appearing, NAD NEURO/PSYCH:  Alert and oriented x 3, no focal deficits EYES:  eyes equal and reactive ENT/NECK:  no LAD, no JVD CARDIO: Regular rate, well-perfused, normal S1 and S2 PULM:  CTAB no wheezing or rhonchi GI/GU:  non-distended, non-tender MSK/SPINE:  No gross deformities, no edema SKIN:  no rash, atraumatic; wound to the palmar surface of the left hand without significant erythema, no significant tenderness, no purulence   *Additional and/or pertinent findings included in MDM below  Diagnostic and  Interventional Summary    EKG Interpretation  Date/Time:    Ventricular Rate:    PR Interval:    QRS Duration:   QT Interval:    QTC Calculation:   R Axis:     Text Interpretation:         Labs Reviewed  CBC WITH DIFFERENTIAL/PLATELET - Abnormal; Notable for the following components:      Result Value   WBC 11.6 (*)    Neutro Abs 9.5 (*)    Monocytes Absolute 1.1 (*)    Abs Immature Granulocytes 0.09 (*)    All other components within normal limits  CBG MONITORING, ED - Abnormal; Notable for the following components:   Glucose-Capillary 244 (*)    All  other components within normal limits  CULTURE, BLOOD (ROUTINE X 2)  CULTURE, BLOOD (ROUTINE X 2)  URINE CULTURE  LACTIC ACID, PLASMA  LACTIC ACID, PLASMA  COMPREHENSIVE METABOLIC PANEL  PROTIME-INR  APTT  URINALYSIS, ROUTINE W REFLEX MICROSCOPIC  TROPONIN I (HIGH SENSITIVITY)    DG Chest Port 1 View  Final Result    CT HEAD WO CONTRAST (5MM)    (Results Pending)    Medications  sodium chloride 0.9 % bolus 1,000 mL (1,000 mLs Intravenous New Bag/Given 05/19/22 0623)  cefTRIAXone (ROCEPHIN) 2 g in sodium chloride 0.9 % 100 mL IVPB (2 g Intravenous Started During Downtime 05/19/22 0627)     Procedures  /  Critical Care Procedures  ED Course and Medical Decision Making  Initial Impression and Ddx Differential diagnosis includes sepsis, UTI, pneumonia, dehydration, metabolic disturbance, DKA, intracranial bleeding.  Providing empiric fluids, antibiotics, awaiting labs, imaging.  Past medical/surgical history that increases complexity of ED encounter: Anticoagulated, diabetes  Interpretation of Diagnostics Labs, urine, imaging pending  Patient Reassessment and Ultimate Disposition/Management     Signed out to oncoming provider.  Patient management required discussion with the following services or consulting groups:  None  Complexity of Problems Addressed Acute illness or injury that poses threat of life of  bodily function  Additional Data Reviewed and Analyzed Further history obtained from: Further history from spouse/family member  Additional Factors Impacting ED Encounter Risk Consideration of hospitalization  Barth Kirks. Sedonia Small, [place] [email or web address]  Final Clinical Impressions(s) / ED Diagnoses     ICD-10-CM   1. Weakness  R53.1     2. Dehydration  E86.0       ED Discharge Orders     None        Discharge Instructions Discussed with and Provided to Patient:   Discharge Instructions   None      Maudie Flakes, MD 05/19/22 [phone]

## 2022-05-19 NOTE — Progress Notes (Signed)
  Transition of Care [facility]) Screening Note   Patient Details  Name: Omar [name] Date of Birth: [birth year]   Transition of Care [facility]) CM/SW Contact:    Iona Beard, [place] Phone Number: 05/19/2022, 12:09 PM  TOC consulted for HH/DME needs and PCP needs. Per chart review pt has current PCP. TOC will follow for PT/OT eval and recommendations for possible HH/DME.   Transition of Care Department [facility]) has reviewed patient and no TOC needs have been identified at this time. We will continue to monitor patient advancement through interdisciplinary progression rounds. If new patient transition needs arise, please place a TOC consult.

## 2022-05-19 NOTE — Assessment & Plan Note (Signed)
-   We will holding home medication of metformin, Jardiance (not taking) -We will check his blood sugar QA CHS, with SSI coverage -Check an A1c

## 2022-05-19 NOTE — Assessment & Plan Note (Signed)
-  Meet SIRS, early sepsis criteria with tachypnea, hypotension, endorgan damage AKI  -Currently vitals are: Blood pressure 97/62, pulse 68, temperature 99.3 F (37.4 C), temperature source Oral, RR 18,weight 117 kg, SpO2 94 %. -BUN 35, creatinine 2.24, WBC 11.6, lactic acid 1.2  - Patient will be admitted to telemetry floor under close observation -Focus sepsis protocol has been initiated -Continue with aggressive IV fluid resuscitation LR, broad-spectrum antibiotics of Rocephin -We will follow-up with urine and blood cultures accordingly -Monitor vitals and labs closely

## 2022-05-19 NOTE — Assessment & Plan Note (Signed)
  Lab Results  Component Value Date   CREATININE 1.53 (H) 05/20/2022   CREATININE 2.24 (H) 05/19/2022   CREATININE 1.17 11/02/2021   Improving  Creatinine elevated 2.24, with a BUN of 35, likely due to hypotension, sepsis, UTI  -Continue IV fluid resuscitation, monitoring kidney function -Avoiding nephrotoxins

## 2022-05-19 NOTE — Assessment & Plan Note (Signed)
-   Likely due to sepsis -We will holding home medications: Including Aldactone, Demadex,  Verapamil - Continue IV fluid resuscitation

## 2022-05-19 NOTE — Assessment & Plan Note (Addendum)
-   Continue home medication of Eliquis

## 2022-05-19 NOTE — ED Notes (Signed)
Pt states that he has had increased urine frequency

## 2022-05-19 NOTE — ED Provider Notes (Addendum)
  Physical Exam  BP 94/62   Pulse 70   Temp 99.3 F (37.4 C) (Oral)   Resp (!) 22   Ht '5\' 8"'$  (1.727 m)   Wt 117 kg   SpO2 93%   BMI 39.22 kg/m   Physical Exam  Procedures  .Critical Care  Performed by: Varney Biles, MD Authorized by: Varney Biles, MD   Critical care provider statement:    Critical care time (minutes):  30   Critical care was necessary to treat or prevent imminent or life-threatening deterioration of the following conditions:  Sepsis and renal failure   Critical care was time spent personally by me on the following activities:  Development of treatment plan with patient or surrogate, discussions with consultants, evaluation of patient's response to treatment, examination of patient, ordering and review of radiographic studies, ordering and performing treatments and interventions, pulse oximetry, re-evaluation of patient's condition, review of old charts, obtaining history from patient or surrogate and ordering and review of laboratory studies   ED Course / MDM    Medical Decision Making Amount and/or Complexity of Data Reviewed Labs: ordered. Radiology: ordered. ECG/medicine tests: ordered.  Risk Prescription drug management.   I had assumed patient's care from Dr. Sedonia Small. Patient comes in with chief complaint of dysuria, weakness, anorexia He has history of hypertension, diabetes and CAD. Patient reports having fevers at home.  Patient appears dry on my assessment as well.  Labs are pending at this time.  Reassessment: Patient's urinalysis is overall reassuring, but equivocal.  Low-grade temp of 99.3.  Blood pressure 97/62.  He had an isolated reading of 87/55 at 6:30 PM.  Patient has received 1 L of IV fluid.  Clinically not in septic shock.  Likely in severe sepsis.  We will give him another liter of fluid.  Patient is noted to have acute renal failure.  Stable for admission.  Broad-spectrum antibiotics ordered.  Varney Biles, MD 05/19/22  0914   9:18 AM Adding COVID-19 test.    Varney Biles, MD 05/19/22 [phone]

## 2022-05-19 NOTE — H&P (Signed)
History and Physical   Patient: Omar [name]                            PCP: Renee Rival, FNP                    DOB: June 23, 1946            DOA: 05/19/2022 [id number]             DOS: 05/19/2022, 9:43 AM  Paseda, Dewaine Conger, FNP  Patient coming from:   HOME  I have personally reviewed patient's medical records, in electronic medical records, including:  [place] link, and care everywhere.    Chief Complaint:   Chief Complaint  Patient presents with   Weakness    History of present illness:    Omar [name] is a 76 year old male with history of DM 2, HTN, HLD, DVT, factor IV Leiden deficiency, CAD, presented to the ED with chief complaint of generalized weakness, malaise.  Patient reports his symptoms started several days ago and progressively getting worse apparently had recent left hand surgery completed course of antibiotics for wound infection.  Reporting of increased frequency urination, burning with no discharge, malaise.  Per patient had a Tmax of 100.6 last night.    ED course: Upon arrival Tmax 99.6, RR 28-22, BP 87/55, satting 96% on room air Current vitals; Blood pressure 94/66, pulse 68, temperature 99.3 F (37.4 C), temperature source Oral, resp. rate (!) 22, height '5\' 8"'$  (1.727 m), weight 117 kg, SpO2 96 %.       Latest Ref Rng & Units 05/19/2022    6:25 AM 11/02/2021    9:35 AM 05/18/2020    9:04 AM  CBC  WBC 4.0 - 10.5 K/uL 11.6  9.5  8.2   Hemoglobin 13.0 - 17.0 g/dL 15.7  15.7  15.5   Hematocrit 39.0 - 52.0 % 46.9  46.1  46.4   Platelets 150 - 400 K/uL 218  277  286       Latest Ref Rng & Units 05/19/2022    6:25 AM 11/02/2021    9:35 AM 01/27/2021    8:55 AM  BMP  Glucose 70 - 99 mg/dL 222  170  162   BUN 8 - 23 mg/dL 35  34  23   Creatinine 0.61 - 1.24 mg/dL 2.24  1.17  1.18   BUN/Creat Ratio 10 - 24  29  NOT APPLICABLE   Sodium 081 - 145 mmol/L 130  136  139   Potassium 3.5 - 5.1 mmol/L 4.8  4.8  4.7   Chloride 98 - 111 mmol/L  98  99  102   CO2 22 - 32 mmol/L '19  23  28   '$ Calcium 8.9 - 10.3 mg/dL 8.9  10.3  9.7    Lactic acid 1.2, troponin 19,   UA: Cloudy, leukocyte esterase, WBC 11-20,  Patient thought to be septic with a urinary source--low-grade fever, hypotensive, tachypneic, with AKI, mild hyponatremia,  Blood and urine cultures are collected, IV fluid and broad-spectrum antibiotic was initiated  Requested patient to be admitted for sepsis     Patient Denies having: Fever, Chills, Cough, SOB, Chest Pain, Abd pain, N/V/D, headache, dizziness, lightheadedness, Joint pain, rash, open wounds     Review of Systems: As per HPI, otherwise 10 point review of systems were negative.   ----------------------------------------------------------------------------------------------------------------------  No Known Allergies  Home MEDs:  Prior to Admission medications   Medication Sig Start Date End Date Taking? Authorizing Provider  acebutolol (SECTRAL) 200 MG capsule TAKE (1) CAPSULE BY MOUTH TWICE DAILY. 04/17/22  Yes Adrian Prows, MD  apixaban (ELIQUIS) 5 MG TABS tablet Take 5 mg by mouth 2 (two) times daily.   Yes [provider]  Empagliflozin-metFORMIN HCl (SYNJARDY) 12.5-500 MG TABS Take 1 tablet by mouth 2 (two) times daily.   Yes [provider]  lisinopril-hydrochlorothiazide (PRINZIDE,ZESTORETIC) 20-25 MG per tablet Take 1 tablet by mouth daily.   Yes [provider]  metFORMIN (GLUCOPHAGE) 1000 MG tablet TAKE (1) TABLET BY MOUTH TWICE DAILY. Patient taking differently: Take 1,000 mg by mouth 2 (two) times a day. 02/21/16  Yes Nida, Marella Chimes, MD  Multiple Vitamins-Minerals (ONE-A-DAY 50 PLUS PO) Take 1 tablet by mouth at bedtime.    Yes [provider]  nitroGLYCERIN (NITROSTAT) 0.4 MG SL tablet PLACE 1 TAB UNDER TONGUE EVERY 5 MIN IF NEEDED FOR CHEST PAIN. MAY USE 3 TIMES.NO RELIEF CALL 911. 11/28/21  Yes Adrian Prows, MD  omeprazole (PRILOSEC) 20 MG capsule  TAKE 1 CAPSULE 30 MINUTES BEFORE BREAKFAST. 08/09/21  Yes Annitta Needs, NP  rosuvastatin (CRESTOR) 20 MG tablet Take 1 tablet (20 mg total) by mouth daily. 11/18/21 11/13/22 Yes Adrian Prows, MD  spironolactone (ALDACTONE) 50 MG tablet Take 1 tablet (50 mg total) by mouth every morning. 04/03/19  Yes Adrian Prows, MD  tamsulosin (FLOMAX) 0.4 MG CAPS capsule Take 0.4 mg by mouth daily.   Yes [provider]  testosterone cypionate (DEPOTESTOSTERONE CYPIONATE) 200 MG/ML injection Inject 0.5 mLs (100 mg total) into the muscle every 7 (seven) days. Patient taking differently: Inject 100 mg into the muscle every 14 (fourteen) days. 02/21/22  Yes Dahlstedt, Annie Main, MD  verapamil (CALAN-SR) 240 MG CR tablet Take 1 tablet (240 mg total) by mouth at bedtime. 05/12/13  Yes Rothbart, Cristopher Estimable, MD  cetirizine (ZYRTEC ALLERGY) 10 MG tablet Take 1 tablet (10 mg total) by mouth daily. Patient not taking: Reported on 05/19/2022 05/20/20   Emerson Monte, FNP  empagliflozin (JARDIANCE) 25 MG TABS tablet Take 1 tablet (25 mg total) by mouth daily. Patient not taking: Reported on 05/19/2022 01/27/22   Renee Rival, FNP  fluticasone (FLONASE) 50 MCG/ACT nasal spray Place 1 spray into both nostrils daily for 14 days. 05/20/20 10/14/21  Avegno, Darrelyn Hillock, FNP  torsemide (DEMADEX) 20 MG tablet Take 20 mg by mouth daily as needed (swelling). Patient not taking: Reported on 05/19/2022 12/19/16   [provider]    PRN MEDs: sodium chloride, acetaminophen **OR** acetaminophen, bisacodyl, hydrALAZINE, HYDROmorphone (DILAUDID) injection, ipratropium, levalbuterol, nitroGLYCERIN, ondansetron **OR** ondansetron (ZOFRAN) IV, oxyCODONE, senna-docusate, sodium chloride flush, sodium phosphate, traZODone  Past Medical History:  Diagnosis Date   Abnormal EKG    Inferior Q waves   BPH (benign prostatic hyperplasia)    Chronic anticoagulation 01/16/2017   Coronary artery disease    Degenerative joint disease     Diabetes mellitus, type 2 (HCC)    No insulin   DVT (deep venous thrombosis) (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 Merlene Laughter interpreted the study-CPAP recommended, but refused by patient   Stroke [facility])    no deficits from stroke, "Didnt know I had one when they say I did".    Past Surgical History:  Procedure Laterality Date   BIOPSY  04/16/2019   Procedure: BIOPSY;  Surgeon: Danie Binder, MD;  Location[facility];  Service: Endoscopy;;   CATARACT EXTRACTION W/PHACO Left 04/03/2016   Procedure: CATARACT EXTRACTION PHACO AND INTRAOCULAR LENS PLACEMENT LEFT EYE CDE=21.76;  Surgeon: Williams Che, MD;  Location: [facility] ORS;  Service: Ophthalmology;  Laterality: Left;   CATARACT EXTRACTION W/PHACO Right 06/19/2016   Procedure: CATARACT EXTRACTION PHACO AND INTRAOCULAR LENS PLACEMENT; CDE:  11.56;  Surgeon: Williams Che, MD;  Location: [facility] ORS;  Service: Ophthalmology;  Laterality: Right;   COLONOSCOPY N/A 04/16/2019   Procedure: COLONOSCOPY;  Surgeon: Danie Binder, MD;  Location: [facility];  Service: Endoscopy;  Laterality: N/A;  8:30am   COLONOSCOPY W/ POLYPECTOMY  05/2008   polypectomy x4-adenomatous; internal hemorrhoids   CORONARY ANGIOPLASTY WITH STENT PLACEMENT  06/04/2012     DES    to [place]     x 1   DUPUYTREN / PALMAR FASCIOTOMY  2009   rt hand-dsc-went home same day x2   ESOPHAGOGASTRODUODENOSCOPY N/A 04/16/2019   Procedure: ESOPHAGOGASTRODUODENOSCOPY (EGD);  Surgeon: Danie Binder, MD;  [facility]  [facility];  Service: Endoscopy;  Laterality: N/A;   LEFT HEART CATHETERIZATION WITH CORONARY ANGIOGRAM N/A 05/28/2012   Procedure: LEFT HEART CATHETERIZATION WITH CORONARY ANGIOGRAM;  Surgeon: Laverda Page, MD;  Location: [facility] CATH LAB;  Service: Cardiovascular;  Laterality: N/A;   PERCUTANEOUS CORONARY STENT INTERVENTION (PCI-S) N/A 06/04/2012   Procedure: PERCUTANEOUS CORONARY STENT INTERVENTION (PCI-S);  Surgeon: Laverda Page, MD;  Location: [facility] C[facility] LAB;  Service: Cardiovascular;  Laterality: N/A;   POLYPECTOMY  04/16/2019   Procedure: POLYPECTOMY;  Surgeon: Danie Binder, MD;  [facility];  Service: Endoscopy;;   TONSILLECTOMY     TOTAL HIP ARTHROPLASTY Right 06/24/2019   Procedure: TOTAL HIP ARTHROPLASTY;  Surgeon: Marchia Bond, MD;  Location: [facility] ORS;  Service: Orthopedics;  Laterality: Right;   VENTRAL HERNIA REPAIR  2010   umb hernia-[facility]-went home same day     reports that he has never smoked. He has never used smokeless tobacco. He reports that he does not drink alcohol and does not use drugs.   Family History  Problem Relation Age of Onset   Cancer Mother    Brain cancer Mother    Cancer Father    Prostate cancer Father    Breast cancer Daughter    Thyroid cancer Daughter    Coronary artery disease Neg Hx    Colon cancer Neg Hx     Physical Exam:   Vitals:   05/19/22 0830 05/19/22 0900 05/19/22 0915 05/19/22 0930  BP: 94/62 97/62  94/66  Pulse: 70 74 68 68  Resp: (!) 22 (!) 22 18 (!) 22  Temp:      TempSrc:      SpO2: 93% 95% 94% 96%  Weight:      Height:       Constitutional: NAD, calm, comfortable Eyes: PERRL, lids and conjunctivae normal ENMT: Mucous membranes are moist. Posterior pharynx clear of any exudate  or lesions.Normal dentition.  Neck: normal, supple, no masses, no thyromegaly Respiratory: clear to auscultation bilaterally, no wheezing, no crackles. Normal respiratory effort. No accessory muscle use.  Cardiovascular: Regular  rate and rhythm, no murmurs / rubs / gallops. No extremity edema. 2+ pedal pulses. No carotid bruits.  Abdomen: no tenderness, no masses palpated. No hepatosplenomegaly. Bowel sounds positive.  Musculoskeletal: no clubbing / cyanosis. No joint deformity upper and lower extremities. Good ROM, no contractures. Normal muscle tone.  Neurologic: CN II-XII grossly intact. Sensation intact, DTR normal. Strength 5/5 in all 4.  Psychiatric: Normal judgment and insight. Alert and oriented x 3. Normal mood.  Skin: no rashes, lesions, ulcers. No induration Decubitus/ulcers:  Wounds: per nursing documentation         Labs on admission:    I have personally reviewed following labs and imaging studies  CBC: Recent Labs  Lab 05/19/22 0625  WBC 11.6*  NEUTROABS 9.5*  HGB 15.7  HCT 46.9  MCV 89.5  PLT 161   Basic Metabolic Panel: Recent Labs  Lab 05/19/22 0625  NA 130*  K 4.8  CL 98  CO2 19*  GLUCOSE 222*  BUN 35*  CREATININE 2.24*  CALCIUM 8.9   GFR: Estimated Creatinine Clearance: 33.8 mL/min (A) (by C-G formula based on SCr of 2.24 mg/dL (H)). Liver Function Tests: Recent Labs  Lab 05/19/22 0625  AST 20  ALT 15  ALKPHOS 48  BILITOT 0.6  PROT 6.9  ALBUMIN 3.6   No results for input(s): "LIPASE", "AMYLASE" in the last 168 hours. No results for input(s): "AMMONIA" in the last 168 hours. Coagulation Profile: Recent Labs  Lab 05/19/22 0625  INR 1.7*   C CBG: Recent Labs  Lab 05/19/22 0643  GLUCAP 244*    Urine analysis:    Component Value Date/Time   COLORURINE YELLOW 05/19/2022 0730   APPEARANCEUR CLOUDY (A) 05/19/2022 0730   APPEARANCEUR Clear 04/18/2022 1642   LABSPEC 1.022 05/19/2022 0730   PHURINE 5.0 05/19/2022 0730   GLUCOSEU >=500 (A) 05/19/2022 0730   HGBUR SMALL (A) 05/19/2022 0730   BILIRUBINUR NEGATIVE 05/19/2022 0730   BILIRUBINUR Negative 04/18/2022 1642   KETONESUR NEGATIVE 05/19/2022 0730   PROTEINUR NEGATIVE 05/19/2022 0730    UROBILINOGEN 0.2 08/02/2009 1539   NITRITE NEGATIVE 05/19/2022 0730   LEUKOCYTESUR SMALL (A) 05/19/2022 0730    Last A1C:  Lab Results  Component Value Date   HGBA1C 8.2 (H) 11/02/2021     Radiologic Exams on Admission:   CT HEAD WO CONTRAST (5MM)  Result Date: 05/19/2022 CLINICAL DATA:  76 year old male with dizziness for several days. Hypotensive last night. EXAM: CT HEAD WITHOUT CONTRAST TECHNIQUE: Contiguous axial images were obtained from the base of the skull through the vertex without intravenous contrast. RADIATION DOSE REDUCTION: This exam was performed according to the departmental dose-optimization program which includes automated exposure control, adjustment of the mA and/or kV according to patient size and/or use of iterative reconstruction technique. COMPARISON:  Brain MRI 02/06/2020.  Head CT 09/14/2006. FINDINGS: Brain: Chronic densely calcified extra-axial mass along the left lateral convexity measuring 3.6 x 1.6 cm is stable since 2007 and might reflect a chronic meningioma. Mild underlying chronic mass effect at the operculum with no cerebral edema. Patchy chronic infarcts in both cerebellar hemispheres are stable. No midline shift, ventriculomegaly, intracranial hemorrhage or evidence of cortically based acute infarction. Stable gray-white matter differentiation compared to 2007. Vascular: Calcified atherosclerosis at the skull base. No suspicious intracranial vascular hyperdensity. Skull: Stable,  intact. Sinuses/Orbits: Visualized paranasal sinuses and mastoids are clear. Tympanic cavities appear clear. Other: No acute orbit or scalp soft tissue finding. Postoperative changes to both globes since 2007. IMPRESSION: 1. No acute intracranial abnormality. 2. 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 Electronically Signed   By: Genevie Ann M.D.   On: 05/19/2022 06:58   DG Chest Port 1 View  Result Date:  05/19/2022 CLINICAL DATA:  Question sepsis. EXAM: PORTABLE CHEST 1 VIEW COMPARISON:  Chest XR, 10/04/2016.  CT chest, 09/15/2006. FINDINGS: Cardiac silhouette is is at the upper limit of normal in size. Tortuosity thoracic aorta. Lungs are hypoinflated with linear basilar opacities greatest at the LEFT lung lung base. No focal consolidation. No pleural effusion or pneumothorax. No acute displaced fracture. IMPRESSION: Hypoinflation with trace basilar atelectasis No acute superimposed cardiopulmonary process. Electronically Signed   By: Michaelle Birks M.D.   On: 05/19/2022 06:51    EKG:   Independently reviewed.  Orders placed or performed during the hospital encounter of 05/19/22   ED EKG 12-Lead   ED EKG 12-Lead   EKG 12-Lead   ---------------------------------------------------------------------------------------------------------------------------------------    Assessment / Plan:   Principal Problem:   Sepsis secondary to UTI [facility]) Active Problems:   AKI (acute kidney injury) ([place])   Hypotensive episode   Essential hypertension   Type 2 diabetes mellitus with vascular disease ([place])   Heterozygous factor V Leiden mutation ([place])   Hyponatremia   Mixed hyperlipidemia   CAD (coronary artery disease), native coronary artery   DVT of leg (deep venous thrombosis) (HCC)   Morbid obesity ([place])   Testosterone deficiency in male   Assessment and Plan: * Sepsis secondary to UTI ([place]) -Meet SIRS, early sepsis criteria with tachypnea, hypotension, endorgan damage AKI  -Currently vitals are: Blood pressure 97/62, pulse 68, temperature 99.3 F (37.4 C), temperature source Oral, RR 18,weight 117 kg, SpO2 94 %. -BUN 35, creatinine 2.24, WBC 11.6, lactic acid 1.2  - Patient will be admitted to telemetry floor under close observation -Focus sepsis protocol has been initiated -Continue with aggressive IV fluid resuscitation LR, broad-spectrum antibiotics of Rocephin -We will follow-up with  urine and blood cultures accordingly -Monitor vitals and labs closely  Hypotensive episode - Likely due to sepsis -We will holding home medications: Including Aldactone, Demadex,  Verapamil - Continue IV fluid resuscitation  AKI (acute kidney injury) ([place]) Lab Results  Component Value Date   CREATININE 2.24 (H) 05/19/2022   CREATININE 1.17 11/02/2021   CREATININE 1.18 01/27/2021   Creatinine elevated 2.24, with a BUN of 35, likely due to hypotension, sepsis, UTI  -Continue IV fluid resuscitation, monitoring kidney function -Avoiding nephrotoxins  Hyponatremia - Serum Sodium 130 -We will monitor closely, continue IV fluid LR for now   Heterozygous factor V Leiden mutation ([place]) - Currently stable, chronically anticoagulated with Eliquis which will be continued  Type 2 diabetes mellitus with vascular disease ([place]) - We will holding home medication of metformin, Jardiance (not taking) -We will check his blood sugar QA CHS, with SSI coverage -Check an A1c  Essential hypertension - Currently hypotensive, with holding home medication of verapamil, lisinopril-HCTZ, torsemide and spironolactone -Trend BP closely, reinitiating medicine as BP improves  Testosterone deficiency in male - May resume testosterone supplement q. 14 days -On hold for now  Morbid obesity ([place]) Body mass index is 39.22 kg/m. -Patient is advised on weight loss healthy exercise and diet  DVT of leg (deep venous thrombosis) (HCC) -  Continue home medication of Eliquis  CAD (coronary artery disease), native coronary artery - Currently stable denies any chest pain -Holding home medication of calcium channel blockers due to hypotension, resume Eliquis -patient is not on any beta-blockers   Mixed hyperlipidemia - Continue statins- rosuvastatin       Consults called:   None -------------------------------------------------------------------------------------------------------------------------------------------- DVT prophylaxis:  TED hose Start: 05/19/22 0918 SCDs Start: 05/19/22 0918 apixaban (ELIQUIS) tablet 5 mg   Code Status:   Code Status: Full Code   Admission status: Patient will be admitted as Inpatient, with a greater than 2 midnight length of stay. Level of care: Telemetry   Family Communication:  none at bedside  (The above findings and plan of care has been discussed with patient in detail, the patient expressed understanding and agreement of above plan)  --------------------------------------------------------------------------------------------------------------------------------------------------  Disposition Plan: >3 days Status is: Inpatient Remains inpatient appropriate because: Needing aggressive treatment for sepsis, with IV fluids, IV antibiotics, obtaining and following cultures     ----------------------------------------------------------------------------------------------------------------------------------------------------  Time spent: > than  88  Min.   SIGNED: Deatra James, MD, FHM. [facility],  Pager (Please use [email or web address] to page to text)  If 7PM-7AM, please contact night-coverage [email or web address],  05/19/2022, 9:43 AM

## 2022-05-19 NOTE — ED Triage Notes (Signed)
Pt c/o dizziness for the past few days, pts wife noted that he was hypotensive last night as well. Pt also had a temp last night of 100.6.  Pt recently had surgery on his left hand on 04/28/22.

## 2022-05-19 NOTE — Assessment & Plan Note (Signed)
-   Serum Sodium 130 -We will monitor closely, continue IV fluid LR for now

## 2022-05-19 NOTE — Sepsis Progress Note (Signed)
eLink is following this Code Sepsis. °

## 2022-05-19 NOTE — Assessment & Plan Note (Signed)
-   Continue statins- rosuvastatin

## 2022-05-19 NOTE — Assessment & Plan Note (Signed)
Body mass index is 39.22 kg/m. -Patient is advised on weight loss healthy exercise and diet

## 2022-05-19 NOTE — Progress Notes (Signed)
OT Cancellation Note  Patient Details Name: Omar [name] MRN: [id number] DOB: 1945-11-13   Cancelled Treatment:    Reason Eval/Treat Not Completed: OT screened, no needs identified, will sign off. Pt independent in ADLs and mobility tasks, at baseline, no further OT services required at this time.    Guadelupe Sabin, OTR/L  [phone] 05/19/2022, 3:57 PM

## 2022-05-19 NOTE — ED Notes (Signed)
Both sets of blood cultures were obtained before antibiotics were started.

## 2022-05-20 DIAGNOSIS — N390 Urinary tract infection, site not specified: Secondary | ICD-10-CM | POA: Diagnosis not present

## 2022-05-20 DIAGNOSIS — A419 Sepsis, unspecified organism: Secondary | ICD-10-CM | POA: Diagnosis not present

## 2022-05-20 LAB — CBC
HCT: 41 % (ref 39.0–52.0)
Hemoglobin: 13.5 g/dL (ref 13.0–17.0)
MCH: 29.9 pg (ref 26.0–34.0)
MCHC: 32.9 g/dL (ref 30.0–36.0)
MCV: 90.7 fL (ref 80.0–100.0)
Platelets: 165 10*3/uL (ref 150–400)
RBC: 4.52 MIL/uL (ref 4.22–5.81)
RDW: 14.1 % (ref 11.5–15.5)
WBC: 8.3 10*3/uL (ref 4.0–10.5)
nRBC: 0 % (ref 0.0–0.2)

## 2022-05-20 LAB — GLUCOSE, CAPILLARY
Glucose-Capillary: 120 mg/dL — ABNORMAL HIGH (ref 70–99)
Glucose-Capillary: 208 mg/dL — ABNORMAL HIGH (ref 70–99)
Glucose-Capillary: 255 mg/dL — ABNORMAL HIGH (ref 70–99)
Glucose-Capillary: 208 mg/dL — ABNORMAL HIGH (ref 70–99)

## 2022-05-20 LAB — BASIC METABOLIC PANEL
Anion gap: 7 (ref 5–15)
BUN: 25 mg/dL — ABNORMAL HIGH (ref 8–23)
CO2: 24 mmol/L (ref 22–32)
Calcium: 8.4 mg/dL — ABNORMAL LOW (ref 8.9–10.3)
Chloride: 104 mmol/L (ref 98–111)
Creatinine, Ser: 1.53 mg/dL — ABNORMAL HIGH (ref 0.61–1.24)
GFR, Estimated: 46 mL/min — ABNORMAL LOW (ref >60)
Glucose, Bld: 122 mg/dL — ABNORMAL HIGH (ref 70–99)
Potassium: 4.3 mmol/L (ref 3.5–5.1)
Sodium: 135 mmol/L (ref 135–145)

## 2022-05-20 LAB — URINE CULTURE: Culture: 30000 — AB

## 2022-05-20 LAB — APTT: aPTT: 30 seconds (ref 24–36)

## 2022-05-20 LAB — PROTIME-INR
INR: 1.7 — ABNORMAL HIGH (ref 0.8–1.2)
Prothrombin Time: 19.7 seconds — ABNORMAL HIGH (ref 11.4–15.2)

## 2022-05-20 MED ORDER — TRAZODONE HCL 50 MG PO TABS
50.0000 mg | ORAL_TABLET | Freq: Every evening | ORAL | Status: DC | PRN
  Administered 2022-05-20: 50 mg via ORAL
  Filled 2022-05-20: qty 1

## 2022-05-20 NOTE — Progress Notes (Signed)
PROGRESS NOTE    Patient: Omar [name]                            PCP: Renee Rival, FNP                    DOB: 05/29/1946            DOA: 05/19/2022 [id number]             DOS: 05/20/2022, 3:38 PM   LOS: 1 day   Date of Service: The patient was seen and examined on 05/20/2022  Subjective:   The patient was seen and examined this morning. Mechanically stable Otherwise no issues overnight .  Brief Narrative:   OMIR COOPRIDER is a 76 year old male with history of DM 2, HTN, HLD, DVT, factor IV Leiden deficiency, CAD, presented to the ED with chief complaint of generalized weakness, malaise.  Patient reports his symptoms started several days ago and progressively getting worse apparently had recent left hand surgery completed course of antibiotics for wound infection.  Reporting of increased frequency urination, burning with no discharge, malaise.  Per patient had a Tmax of 100.6 last night.    ED course: Upon arrival Tmax 99.6, RR 28-22, BP 87/55, satting 96% on room air Current vitals; Blood pressure 94/66, pulse 68, temperature 99.3 F (37.4 C), temperature source Oral, resp. rate (!) 22, height '5\' 8"'$  (1.727 m), weight 117 kg, SpO2 96 %.       Latest Ref Rng & Units 05/19/2022    6:25 AM 11/02/2021    9:35 AM 05/18/2020    9:04 AM  CBC  WBC 4.0 - 10.5 K/uL 11.6  9.5  8.2   Hemoglobin 13.0 - 17.0 g/dL 15.7  15.7  15.5   Hematocrit 39.0 - 52.0 % 46.9  46.1  46.4   Platelets 150 - 400 K/uL 218  277  286       Latest Ref Rng & Units 05/19/2022    6:25 AM 11/02/2021    9:35 AM 01/27/2021    8:55 AM  BMP  Glucose 70 - 99 mg/dL 222  170  162   BUN 8 - 23 mg/dL 35  34  23   Creatinine 0.61 - 1.24 mg/dL 2.24  1.17  1.18   BUN/Creat Ratio 10 - 24  29  NOT APPLICABLE   Sodium 951 - 145 mmol/L 130  136  139   Potassium 3.5 - 5.1 mmol/L 4.8  4.8  4.7   Chloride 98 - 111 mmol/L 98  99  102   CO2 22 - 32 mmol/L '19  23  28   '$ Calcium 8.9 - 10.3 mg/dL 8.9  10.3  9.7     Lactic acid 1.2, troponin 19,   UA: Cloudy, leukocyte esterase, WBC 11-20,  Patient thought to be septic with a urinary source--low-grade fever, hypotensive, tachypneic, with AKI, mild hyponatremia,  Blood and urine cultures are collected, IV fluid and broad-spectrum antibiotic was initiated  Requested patient to be admitted for sepsis     Assessment & Plan:   Principal Problem:   Sepsis secondary to UTI [facility]) Active Problems:   AKI (acute kidney injury) ([place])   Hypotensive episode   Essential hypertension   Type 2 diabetes mellitus with vascular disease ([place])   Heterozygous factor V Leiden mutation ([place])   Hyponatremia   Mixed hyperlipidemia   CAD (coronary artery disease), native coronary  artery   DVT of leg (deep venous thrombosis) (H[facility])   Morbid obesity (H[facility])   Testosterone deficiency in male     Assessment and Plan: * Sepsis secondary to UTI ([place]) -Meet SIRS, early sepsis criteria with tachypnea, hypotension, endorgan damage AKI  -Currently vitals are: Blood pressure 97/62, pulse 68, temperature 99.3 F (37.4 C), temperature source Oral, RR 18,weight 117 kg, SpO2 94 %. -BUN 35, creatinine 2.24, WBC 11.6, lactic acid 1.2  - Patient will be admitted to telemetry floor under close observation -Focus sepsis protocol has been initiated -Continue with aggressive IV fluid resuscitation LR, broad-spectrum antibiotics of Rocephin -We will follow-up with urine and blood cultures accordingly -Monitor vitals and labs closely  Hypotensive episode - Likely due to sepsis -We will holding home medications: Including Aldactone, Demadex,  Verapamil - Continue IV fluid resuscitation  AKI (acute kidney injury) ([place])  Lab Results  Component Value Date   CREATININE 1.53 (H) 05/20/2022   CREATININE 2.24 (H) 05/19/2022   CREATININE 1.17 11/02/2021   Improving  Creatinine elevated 2.24, with a BUN of 35, likely due to hypotension, sepsis, UTI  -Continue IV fluid  resuscitation, monitoring kidney function -Avoiding nephrotoxins  Hyponatremia - Serum Sodium 130 >>134 -We will monitor closely, continue IV fluid LR for now   Heterozygous factor V Leiden mutation ([place]) -stable, chronically anticoagulated with Eliquis -continued  Type 2 diabetes mellitus with vascular disease ([place]) - We will holding home medication of metformin, Jardiance (not taking) -We will check his blood sugar QA CHS, with SSI coverage -Check an A1c  Essential hypertension - was  hypotensive, with holding home medication of verapamil, lisinopril-HCTZ, torsemide and spironolactone -Trend BP closely, reinitiating medicine as BP improves  Testosterone deficiency in male - May resume testosterone supplement q. 14 days -On hold for now  Morbid obesity ([place]) Body mass index is 39.22 kg/m. -Patient is advised on weight loss healthy exercise and diet  DVT of leg (deep venous thrombosis) (HCC) - Continue home medication of Eliquis  CAD (coronary artery disease), native coronary artery -Stable, denies chest pain -Still Holding home medication of calcium channel blockers due to hypotension, resume Eliquis -patient is not on any beta-blockers   Mixed hyperlipidemia - Continue statins- rosuvastatin        ------------------------------------------------------------------------------------------------------------------------------------- Cultures; Blood Cultures x 2 >> NGT Urine Culture  >>> NGT       -------------------------------------------------------------------------------------------------------------------------------------------  DVT prophylaxis:  TED hose Start: 05/19/22 0918 SCDs Start: 05/19/22 0918 apixaban (ELIQUIS) tablet 5 mg   Code Status:   Code Status: Full Code  Family Communication: No family member present at bedside- attempt will be made to update daily The above findings and plan of care has been discussed with patient (and family)  in  detail,  they expressed understanding and agreement of above. -Advance care planning has been discussed.   Admission status:   Status is: Inpatient Remains inpatient appropriate because: still      Procedures:   No admission procedures for hospital encounter.   Antimicrobials:  Anti-infectives (From admission, onward)    Start     Dose/Rate Route Frequency Ordered Stop   05/20/22 1000  cefTRIAXone (ROCEPHIN) 2 g in sodium chloride 0.9 % 100 mL IVPB        2 g 200 mL/hr over 30 Minutes Intravenous Every 24 hours 05/19/22 0920 05/27/22 0959   05/19/22 0830  vancomycin (VANCOREADY) IVPB 2000 mg/400 mL        2,000 mg 200 mL/hr over 120 Minutes Intravenous  Once 05/19/22 0819 05/19/22 1036   05/19/22 0815  metroNIDAZOLE (FLAGYL) IVPB 500 mg        500 mg 100 mL/hr over 60 Minutes Intravenous  Once 05/19/22 0809 05/19/22 0928   05/19/22 0815  vancomycin (VANCOCIN) IVPB 1000 mg/200 mL premix  Status:  Discontinued        1,000 mg 200 mL/hr over 60 Minutes Intravenous  Once 05/19/22 0809 05/19/22 0819   05/19/22 0815  cefTRIAXone (ROCEPHIN) 2 g in sodium chloride 0.9 % 100 mL IVPB  Status:  Discontinued        2 g 200 mL/hr over 30 Minutes Intravenous  Once 05/19/22 0809 05/19/22 1020   05/19/22 0630  cefTRIAXone (ROCEPHIN) 2 g in sodium chloride 0.9 % 100 mL IVPB        2 g 200 mL/hr over 30 Minutes Intravenous  Once 05/19/22 0622 05/19/22 0703        Medication:   acebutolol  200 mg Oral BID   apixaban  5 mg Oral BID   insulin aspart  0-6 Units Subcutaneous TID WC   pantoprazole  40 mg Oral Daily   rosuvastatin  20 mg Oral Daily   sodium chloride flush  3 mL Intravenous Q12H   sodium chloride flush  3 mL Intravenous Q12H   tamsulosin  0.4 mg Oral Daily    sodium chloride, acetaminophen **OR** acetaminophen, bisacodyl, hydrALAZINE, HYDROmorphone (DILAUDID) injection, ipratropium, levalbuterol, nitroGLYCERIN, ondansetron **OR** ondansetron (ZOFRAN) IV, oxyCODONE,  senna-docusate, sodium chloride flush, sodium phosphate, traZODone   Objective:   Vitals:   05/19/22 2037 05/20/22 0405 05/20/22 0500 05/20/22 1219  BP: (!) 95/58 107/67  106/74  Pulse: 65 64  76  Resp: '18 18  18  '$ Temp: 99.2 F (37.3 C) 98 F (36.7 C)  98.3 F (36.8 C)  TempSrc:    Oral  SpO2: 98% 95%  94%  Weight:   116.3 kg   Height:        Intake/Output Summary (Last 24 hours) at 05/20/2022 1538 Last data filed at 05/20/2022 1300 Gross per 24 hour  Intake 720 ml  Output --  Net 720 ml   Filed Weights   05/19/22 0612 05/19/22 1115 05/20/22 0500  Weight: 117 kg 115.8 kg 116.3 kg     Examination:   Physical Exam  Constitution:  Alert, cooperative, no distress,  Appears calm and comfortable  Psychiatric:   Normal and stable mood and affect, cognition intact,   HEENT:        Normocephalic, PERRL, otherwise with in Normal limits  Chest:         Chest symmetric Cardio vascular:  S1/S2, RRR, No murmure, No Rubs or Gallops  pulmonary: Clear to auscultation bilaterally, respirations unlabored, negative wheezes / crackles Abdomen: Soft, non-tender, non-distended, bowel sounds,no masses, no organomegaly Muscular skeletal: Limited exam - in bed, able to move all 4 extremities,   Neuro: CNII-XII intact. , normal motor and sensation, reflexes intact  Extremities: No pitting edema lower extremities, +2 pulses  Skin: Dry, warm to touch, negative for any Rashes, No open wounds Wounds: per nursing documentation   ------------------------------------------------------------------------------------------------------------------------------------------    LABs:     Latest Ref Rng & Units 05/20/2022    4:59 AM 05/19/2022    6:25 AM 11/02/2021    9:35 AM  CBC  WBC 4.0 - 10.5 K/uL 8.3  11.6  9.5   Hemoglobin 13.0 - 17.0 g/dL 13.5  15.7  15.7   Hematocrit 39.0 - 52.0 % 41.0  46.9  46.1   Platelets 150 - 400 K/uL 165  218  277       Latest Ref Rng & Units 05/20/2022    4:59 AM  05/19/2022    6:25 AM 11/02/2021    9:35 AM  CMP  Glucose 70 - 99 mg/dL 122  222  170   BUN 8 - 23 mg/dL 25  35  34   Creatinine 0.61 - 1.24 mg/dL 1.53  2.24  1.17   Sodium 135 - 145 mmol/L 135  130  136   Potassium 3.5 - 5.1 mmol/L 4.3  4.8  4.8   Chloride 98 - 111 mmol/L 104  98  99   CO2 22 - 32 mmol/L '24  19  23   '$ Calcium 8.9 - 10.3 mg/dL 8.4  8.9  10.3   Total Protein 6.5 - 8.1 g/dL  6.9  6.2   Total Bilirubin 0.3 - 1.2 mg/dL  0.6  0.6   Alkaline Phos 38 - 126 U/L  48  68   AST 15 - 41 U/L  20  16   ALT 0 - 44 U/L  15  11        Micro Results Recent Results (from the past 240 hour(s))  Blood Culture (routine x 2)     Status: None (Preliminary result)   Collection Time: 05/19/22  6:25 AM   Specimen: BLOOD RIGHT FOREARM  Result Value Ref Range Status   Specimen Description BLOOD RIGHT FOREARM  Final   Special Requests   Final    BOTTLES DRAWN AEROBIC AND ANAEROBIC Blood Culture adequate volume   Culture   Final    NO GROWTH < 24 HOURS Performed at [facility], [address] [place], [place] [address]    Report Status PENDING  Incomplete  Blood Culture (routine x 2)     Status: None (Preliminary result)   Collection Time: 05/19/22  6:25 AM   Specimen: BLOOD LEFT WRIST  Result Value Ref Range Status   Specimen Description BLOOD LEFT WRIST  Final   Special Requests   Final    BOTTLES DRAWN AEROBIC AND ANAEROBIC Blood Culture results may not be optimal due to an excessive volume of blood received in culture bottles   Culture   Final    NO GROWTH < 24 HOURS Performed at [facility], [address], [place], [place] [address]    Report Status PENDING  Incomplete  Urine Culture     Status: Abnormal   Collection Time: 05/19/22  7:19 AM   Specimen: Urine, Catheterized  Result Value Ref Range Status   Specimen Description   Final    URINE, CATHETERIZED Performed at [facility], [address] [place], [place] [address]    Special Requests   Final    NONE Performed  at [facility], [address], [place],  [address]    Culture (A)  Final    30,000 COLONIES/mL MULTIPLE SPECIES PRESENT, SUGGEST RECOLLECTION   Report Status 05/20/2022 FINAL  Final  SARS Coronavirus 2 by RT PCR (hospital order, performed in [place] hospital lab) *cepheid single result test* Anterior Nasal Swab     Status: None   Collection Time: 05/19/22  9:45 AM   Specimen: Anterior Nasal Swab  Result Value Ref Range Status   SARS Coronavirus 2 by RT PCR NEGATIVE NEGATIVE Final    Comment: (NOTE) SARS-CoV-2 target nucleic acids are NOT DETECTED.  The SARS-CoV-2 RNA is generally detectable in upper  and lower respiratory specimens during the acute phase of infection. The lowest concentration of SARS-CoV-2 viral copies this assay can detect is 250 copies / mL. A negative result does not preclude SARS-CoV-2 infection and should not be used as the sole basis for treatment or other patient management decisions.  A negative result may occur with improper specimen collection / handling, submission of specimen other than nasopharyngeal swab, presence of viral mutation(s) within the areas targeted by this assay, and inadequate number of viral copies (<250 copies / mL). A negative result must be combined with clinical observations, patient history, and epidemiological information.  Fact Sheet for Patients:   https://www.patel.info/  Fact Sheet for Healthcare Providers: https://hall.com/  This test is not yet approved or  cleared by the [place] FDA and has been authorized for detection and/or diagnosis of SARS-CoV-2 by FDA under an Emergency Use Authorization (EUA).  This EUA will remain in effect (meaning this test can be used) for the duration of the COVID-19 declaration under Section 564(b)(1) of the Act, 21 U.S.C. section 360bbb-3(b)(1), unless the authorization is terminated or revoked sooner.  Performed at [facility], [address], [place], [place] [address]     Radiology Reports No results found.  SIGNED: Deatra James, MD, FHM. [facility],  Pager (please use [email or web address] to page/text) Please use Epic Secure Chat for non-urgent communication (7AM-7PM)  If 7PM-7AM, please contact night-coverage [email or web address], 05/20/2022, 3:38 PM

## 2022-05-21 DIAGNOSIS — N390 Urinary tract infection, site not specified: Secondary | ICD-10-CM | POA: Diagnosis not present

## 2022-05-21 DIAGNOSIS — A419 Sepsis, unspecified organism: Secondary | ICD-10-CM | POA: Diagnosis not present

## 2022-05-21 LAB — CBC
HCT: 40.7 % (ref 39.0–52.0)
Hemoglobin: 13.5 g/dL (ref 13.0–17.0)
MCH: 29.9 pg (ref 26.0–34.0)
MCHC: 33.2 g/dL (ref 30.0–36.0)
MCV: 90.2 fL (ref 80.0–100.0)
Platelets: 195 10*3/uL (ref 150–400)
RBC: 4.51 MIL/uL (ref 4.22–5.81)
RDW: 14.2 % (ref 11.5–15.5)
WBC: 7.5 10*3/uL (ref 4.0–10.5)
nRBC: 0 % (ref 0.0–0.2)

## 2022-05-21 LAB — BASIC METABOLIC PANEL
Anion gap: 8 (ref 5–15)
BUN: 20 mg/dL (ref 8–23)
CO2: 23 mmol/L (ref 22–32)
Calcium: 8.5 mg/dL — ABNORMAL LOW (ref 8.9–10.3)
Chloride: 107 mmol/L (ref 98–111)
Creatinine, Ser: 1.22 mg/dL (ref 0.61–1.24)
GFR, Estimated: >60 mL/min (ref >60)
Glucose, Bld: 140 mg/dL — ABNORMAL HIGH (ref 70–99)
Potassium: 3.9 mmol/L (ref 3.5–5.1)
Sodium: 138 mmol/L (ref 135–145)

## 2022-05-21 LAB — GLUCOSE, CAPILLARY
Glucose-Capillary: 150 mg/dL — ABNORMAL HIGH (ref 70–99)
Glucose-Capillary: 236 mg/dL — ABNORMAL HIGH (ref 70–99)

## 2022-05-21 MED ORDER — CIPROFLOXACIN HCL 500 MG PO TABS: 500.0000 mg | ORAL_TABLET | Freq: Two times a day (BID) | ORAL | 0 refills | Status: AC

## 2022-05-21 MED ORDER — LACTINEX PO CHEW: 1.0000 | CHEWABLE_TABLET | Freq: Three times a day (TID) | ORAL | 0 refills | Status: DC

## 2022-05-21 MED ORDER — LACTINEX PO CHEW
1.0000 | CHEWABLE_TABLET | Freq: Three times a day (TID) | ORAL | 0 refills | Status: AC
Start: 2022-05-21 — End: 2022-05-31

## 2022-05-21 MED ORDER — CIPROFLOXACIN HCL 500 MG PO TABS: 500.0000 mg | ORAL_TABLET | Freq: Two times a day (BID) | ORAL | 0 refills | Status: DC

## 2022-05-21 NOTE — Discharge Summary (Signed)
Physician Discharge Summary   Patient: Omar [name] MRN: [id number] DOB: 04-17-1946  Admit date:     05/19/2022  Discharge date: 05/21/22  Discharge Physician: Deatra James   PCP: Renee Rival, FNP   Recommendations at discharge:   Follow with the PCP and 2-4 weeks Follow-up with urologist in 1-2 weeks Continue current antibiotics PCP to monitor kidney function and blood pressure, current may need to be titrated  Discharge Diagnoses: Principal Problem:   Sepsis secondary to UTI [facility]) Active Problems:   AKI (acute kidney injury) ([place])   Hypotensive episode   Essential hypertension   Type 2 diabetes mellitus with vascular disease ([place])   Heterozygous factor V Leiden mutation ([place])   Hyponatremia   Mixed hyperlipidemia   CAD (coronary artery disease), native coronary artery   DVT of leg (deep venous thrombosis) (H[facility])   Morbid obesity ([place])   Testosterone deficiency in male  Resolved Problems:   * No resolved hospital problems. *  Hospital Course: Omar [name] is a 76 year old male with history of DM 2, HTN, HLD, DVT, factor IV Leiden deficiency, CAD, presented to the ED with chief complaint of generalized weakness, malaise.  Patient reports his symptoms started several days ago and progressively getting worse apparently had recent left hand surgery completed course of antibiotics for wound infection.  Reporting of increased frequency urination, burning with no discharge, malaise.  Per patient had a Tmax of 100.6 last night.    ED course: Upon arrival Tmax 99.6, RR 28-22, BP 87/55, satting 96% on room air Current vitals; Blood pressure 94/66, pulse 68, temperature 99.3 F (37.4 C), temperature source Oral, resp. rate (!) 22, height _0  (1.727 m), weight 117 kg, SpO2 96 %. WBC 11.6 BUN 35, creatinine 2.24   Lactic acid 1.2, troponin 19,   UA: Cloudy, leukocyte esterase, WBC 11-20,  Patient thought to be septic with a urinary source--low-grade  fever, hypotensive, tachypneic, with AKI, mild hyponatremia,  Blood and urine cultures are collected, IV fluid and broad-spectrum antibiotic was initiated  Requested patient to be admitted for sepsis      Overall patient was admitted, treated for sepsis due to UTI, with IV fluid, IV antibiotics, patient was hypotensive.  Patient responded well, no growth in cultures, nonspecific growth and urine cultures.  IV Rocephin was switched to p.o. ciprofloxacin.  Hypertension, AKI improved.  patient was finally deemed stable to be discharged home, follow with a PCP and urologist .  ---------------------------------------------------------------------------------------------------------------------------------------------  * Sepsis secondary to UTI [facility]) - Resolved sepsis physiology  -On admission met SIRS, early Sepsis criteria with tachypnea, hypotension, endorgan damage AKI  -Currently vitals are: Blood pressure 97/62, pulse 68, temperature 99.3 F (37.4 C), temperature source Oral, RR 18,weight 117 kg, SpO2 94 %. -BUN 35, creatinine 2.24, WBC 11.6, lactic acid 1.2  - Patient will be admitted to telemetry floor under close observation -Focus sepsis protocol has been initiated - s/p aggressive IV fluid resuscitation LR, broad-spectrum antibiotics of Rocephin >>> switching to p.o. antibiotics of ciprofloxacin  -Blood cultures no growth to date -Urine culture many species-nonspecific growth   Hypotensive episode - Likely due to sepsis -Resolved  AKI (acute kidney injury) ([place]) Lab Results  Component Value Date   CREATININE 1.22 05/21/2022   CREATININE 1.53 (H) 05/20/2022   CREATININE 2.24 (H) 05/19/2022    -Improving  Creatinine elevated 2.24, with a BUN of 35, likely due to hypotension, sepsis, UTI  -S/p IV fluid resuscitation, improved kidney function -Avoiding nephrotoxins  Hyponatremia - Serum Sodium 130 >>134, 138  -S/P IV fluid resuscitation   Heterozygous factor V  Leiden mutation ([place]) -stable, chronically anticoagulated with Eliquis -continued  Type 2 diabetes mellitus with vascular disease ([place]) - Resume metformin, -Diabetic diet -A1c; 7.5 -During hospitalization CBGs were checked QA CHS with Isai coverage  Essential hypertension - was  hypotensive, Improved  - resuming Verapamil, spironolactone -Discontinuing lisinopril-HCTZ, -Resume as needed Torsemide   Testosterone deficiency in male - May resume testosterone supplement q. 14 days   Morbid obesity ([place]) Body mass index is 39.22 kg/m. -Patient is advised on weight loss healthy exercise and diet  DVT of leg (deep venous thrombosis) (HCC) - Continue Eliquis  CAD (coronary artery disease), native coronary artery -Stable, denies chest pain -Was Holding home medication of calcium channel blockers due to hypotension, resume Eliquis -patient is not on any beta-blockers   Mixed hyperlipidemia - Continue statins- rosuvastatin         Disposition: Home Diet recommendation:  Discharge Diet Orders (From admission, onward)     Start     Ordered   05/21/22 0000  Diet - low sodium heart healthy        05/21/22 1012           Carb modified diet DISCHARGE MEDICATION: Allergies as of 05/21/2022   No Known Allergies      Medication List     STOP taking these medications    empagliflozin 25 MG Tabs tablet Commonly known as: Jardiance   lisinopril-hydrochlorothiazide 20-25 MG tablet Commonly known as: ZESTORETIC       TAKE these medications    acebutolol 200 MG capsule Commonly known as: SECTRAL TAKE (1) CAPSULE BY MOUTH TWICE DAILY.   apixaban 5 MG Tabs tablet Commonly known as: ELIQUIS Take 5 mg by mouth 2 (two) times daily.   cetirizine 10 MG tablet Commonly known as: ZyrTEC Allergy Take 1 tablet (10 mg total) by mouth daily.   ciprofloxacin 500 MG tablet Commonly known as: Cipro Take 1 tablet (500 mg total) by mouth 2 (two) times daily for 7  days.   fluticasone 50 MCG/ACT nasal spray Commonly known as: FLONASE Place 1 spray into both nostrils daily for 14 days.   lactobacillus acidophilus & bulgar chewable tablet Chew 1 tablet by mouth 3 (three) times daily with meals for 10 days.   metFORMIN 1000 MG tablet Commonly known as: GLUCOPHAGE TAKE (1) TABLET BY MOUTH TWICE DAILY. What changed: See the new instructions.   nitroGLYCERIN 0.4 MG SL tablet Commonly known as: NITROSTAT PLACE 1 TAB UNDER TONGUE EVERY 5 MIN IF NEEDED FOR CHEST PAIN. MAY USE 3 TIMES.NO RELIEF CALL 911.   omeprazole 20 MG capsule Commonly known as: PRILOSEC TAKE 1 CAPSULE 30 MINUTES BEFORE BREAKFAST.   ONE-A-DAY 50 PLUS PO Take 1 tablet by mouth at bedtime.   rosuvastatin 20 MG tablet Commonly known as: CRESTOR Take 1 tablet (20 mg total) by mouth daily.   spironolactone 50 MG tablet Commonly known as: Aldactone Take 1 tablet (50 mg total) by mouth every morning.   Synjardy 12.5-500 MG Tabs Generic drug: Empagliflozin-metFORMIN HCl Take 1 tablet by mouth 2 (two) times daily.   tamsulosin 0.4 MG Caps capsule Commonly known as: FLOMAX Take 0.4 mg by mouth daily.   testosterone cypionate 200 MG/ML injection Commonly known as: DEPOTESTOSTERONE CYPIONATE Inject 0.5 mLs (100 mg total) into the muscle every 7 (seven) days. What changed: when to take this   torsemide 20 MG tablet Commonly  known as: DEMADEX Take 20 mg by mouth daily as needed (swelling).   verapamil 240 MG CR tablet Commonly known as: CALAN-SR Take 1 tablet (240 mg total) by mouth at bedtime.        Discharge Exam: Filed Weights   05/19/22 1115 05/20/22 0500 05/21/22 0539  Weight: 115.8 kg 116.3 kg 114.3 kg      Physical Exam:   General:  AAO x 3,  cooperative, no distress;   HEENT:  Normocephalic, PERRL, otherwise with in Normal limits   Neuro:  CNII-XII intact. , normal motor and sensation, reflexes intact   Lungs:   Clear to auscultation BL, Respirations  unlabored,  No wheezes / crackles  Cardio:    S1/S2, RRR, No murmure, No Rubs or Gallops   Abdomen:  Soft, non-tender, bowel sounds active all four quadrants, no guarding or peritoneal signs.  Muscular  skeletal:  Limited exam -global generalized weaknesses - in bed, able to move all 4 extremities,   2+ pulses,  symmetric, No pitting edema  Skin:  Dry, warm to touch, negative for any Rashes,  Wounds: Please see nursing documentation        Condition at discharge: good  The results of significant diagnostics from this hospitalization (including imaging, microbiology, ancillary and laboratory) are listed below for reference.   Imaging Studies: CT HEAD WO CONTRAST (5MM)  Result Date: 05/19/2022 CLINICAL DATA:  76 year old male with dizziness for several days. Hypotensive last night. EXAM: CT HEAD WITHOUT CONTRAST TECHNIQUE: Contiguous axial images were obtained from the base of the skull through the vertex without intravenous contrast. RADIATION DOSE REDUCTION: This exam was performed according to the departmental dose-optimization program which includes automated exposure control, adjustment of the mA and/or kV according to patient size and/or use of iterative reconstruction technique. COMPARISON:  Brain MRI 02/06/2020.  Head CT 09/14/2006. FINDINGS: Brain: Chronic densely calcified extra-axial mass along the left lateral convexity measuring 3.6 x 1.6 cm is stable since 2007 and might reflect a chronic meningioma. Mild underlying chronic mass effect at the operculum with no cerebral edema. Patchy chronic infarcts in both cerebellar hemispheres are stable. No midline shift, ventriculomegaly, intracranial hemorrhage or evidence of cortically based acute infarction. Stable gray-white matter differentiation compared to 2007. Vascular: Calcified atherosclerosis at the skull base. No suspicious intracranial vascular hyperdensity. Skull: Stable, intact. Sinuses/Orbits: Visualized paranasal sinuses and  mastoids are clear. Tympanic cavities appear clear. Other: No acute orbit or scalp soft tissue finding. Postoperative changes to both globes since 2007. IMPRESSION: 1. No acute intracranial abnormality. 2. 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 Electronically Signed   By: Genevie Ann M.D.   On: 05/19/2022 06:58   DG Chest Port 1 View  Result Date: 05/19/2022 CLINICAL DATA:  Question sepsis. EXAM: PORTABLE CHEST 1 VIEW COMPARISON:  Chest XR, 10/04/2016.  CT chest, 09/15/2006. FINDINGS: Cardiac silhouette is is at the upper limit of normal in size. Tortuosity thoracic aorta. Lungs are hypoinflated with linear basilar opacities greatest at the LEFT lung lung base. No focal consolidation. No pleural effusion or pneumothorax. No acute displaced fracture. IMPRESSION: Hypoinflation with trace basilar atelectasis No acute superimposed cardiopulmonary process. Electronically Signed   By: Michaelle Birks M.D.   On: 05/19/2022 06:51    Microbiology: Results for orders placed or performed during the hospital encounter of 05/19/22  Blood Culture (routine x 2)     Status: None (Preliminary result)   Collection Time: 05/19/22  6:25  AM   Specimen: BLOOD RIGHT FOREARM  Result Value Ref Range Status   Specimen Description BLOOD RIGHT FOREARM  Final   Special Requests   Final    BOTTLES DRAWN AEROBIC AND ANAEROBIC Blood Culture adequate volume   Culture   Final    NO GROWTH 2 DAYS Performed at [facility], [address], [place], [place] [address]    Report Status PENDING  Incomplete  Blood Culture (routine x 2)     Status: None (Preliminary result)   Collection Time: 05/19/22  6:25 AM   Specimen: BLOOD LEFT WRIST  Result Value Ref Range Status   Specimen Description BLOOD LEFT WRIST  Final   Special Requests   Final    BOTTLES DRAWN AEROBIC AND ANAEROBIC Blood Culture results may not be optimal due to an excessive volume of blood received in  culture bottles   Culture   Final    NO GROWTH 2 DAYS Performed at [facility], [address], [place], [place] [address]    Report Status PENDING  Incomplete  Urine Culture     Status: Abnormal   Collection Time: 05/19/22  7:19 AM   Specimen: Urine, Catheterized  Result Value Ref Range Status   Specimen Description   Final    URINE, CATHETERIZED Performed at [facility], [address] [place], [place] [address]    Special Requests   Final    NONE Performed at [facility], [address], [place], [place] [address]    Culture (A)  Final    30,000 COLONIES/mL MULTIPLE SPECIES PRESENT, SUGGEST RECOLLECTION   Report Status 05/20/2022 FINAL  Final  SARS Coronavirus 2 by RT PCR (hospital order, performed in [place] hospital lab) *cepheid single result test* Anterior Nasal Swab     Status: None   Collection Time: 05/19/22  9:45 AM   Specimen: Anterior Nasal Swab  Result Value Ref Range Status   SARS Coronavirus 2 by RT PCR NEGATIVE NEGATIVE Final    Comment: (NOTE) SARS-CoV-2 target nucleic acids are NOT DETECTED.  The SARS-CoV-2 RNA is generally detectable in upper and lower respiratory specimens during the acute phase of infection. The lowest concentration of SARS-CoV-2 viral copies this assay can detect is 250 copies / mL. A negative result does not preclude SARS-CoV-2 infection and should not be used as the sole basis for treatment or other patient management decisions.  A negative result may occur with improper specimen collection / handling, submission of specimen other than nasopharyngeal swab, presence of viral mutation(s) within the areas targeted by this assay, and inadequate number of viral copies (<250 copies / mL). A negative result must be combined with clinical observations, patient history, and epidemiological information.  Fact Sheet for Patients:   https://www.patel.info/  Fact Sheet for Healthcare  Providers: https://hall.com/  This test is not yet approved or  cleared by the [place] FDA and has been authorized for detection and/or diagnosis of SARS-CoV-2 by FDA under an Emergency Use Authorization (EUA).  This EUA will remain in effect (meaning this test can be used) for the duration of the COVID-19 declaration under Section 564(b)(1) of the Act, 21 U.S.C. section 360bbb-3(b)(1), unless the authorization is terminated or revoked sooner.  Performed at [facility], [address] [place], [place] [address]     Labs: CBC: Recent Labs  Lab 05/19/22 0625 05/20/22 0459 05/21/22 0344  WBC 11.6* 8.3 7.5  NEUTROABS 9.5*  --   --   HGB 15.7 13.5 13.5  HCT 46.9 41.0 40.7  MCV 89.5 90.7 90.2  PLT 218 165 388   Basic Metabolic Panel: Recent Labs  Lab 05/19/22 0625 05/20/22 0459 05/21/22 0344  NA 130* 135 138  K 4.8 4.3 3.9  CL 98 104 107  CO2 19* 24 23  GLUCOSE 222* 122* 140*  BUN 35* 25* 20  CREATININE 2.24* 1.53* 1.22  CALCIUM 8.9 8.4* 8.5*   Liver Function Tests: Recent Labs  Lab 05/19/22 0625  AST 20  ALT 15  ALKPHOS 48  BILITOT 0.6  PROT 6.9  ALBUMIN 3.6   CBG: Recent Labs  Lab 05/20/22 0722 05/20/22 1118 05/20/22 1618 05/20/22 2055 05/21/22 0801  GLUCAP 120* 208* 255* 208* 150*    Discharge time spent: greater than 30 minutes.  Signed: Deatra James, MD [facility] 05/21/2022

## 2022-05-22 ENCOUNTER — Telehealth: Payer: Self-pay

## 2022-05-22 NOTE — Telephone Encounter (Signed)
Transition Care Management Unsuccessful Follow-up Telephone Call  Date of discharge and from where:  05/21/22 [place]  Attempts:  1st Attempt  Reason for unsuccessful TCM follow-up call:  Left voice message

## 2022-05-22 NOTE — Telephone Encounter (Signed)
Refill request

## 2022-05-24 ENCOUNTER — Telehealth: Payer: Self-pay

## 2022-05-24 LAB — CULTURE, BLOOD (ROUTINE X 2)
Culture: NO GROWTH
Culture: NO GROWTH
Special Requests: ADEQUATE

## 2022-05-24 NOTE — Telephone Encounter (Signed)
Transition Care Management Follow-up Telephone Call Date of discharge and from where: 05/21/22 [place] How have you been since you were released from the hospital? Energy a lot better taking It slow Any questions or concerns? No  Items Reviewed: Did the pt receive and understand the discharge instructions provided? Yes  Medications obtained and verified? Yes  Other?  N/a Any new allergies since your discharge? No  Dietary orders reviewed? Yes Do you have support at home? Yes   Home Care and Equipment/Supplies: Were home health services ordered? no If so, what is the name of the agency? N/a  Has the agency set up a time to come to the patient's home? not applicable Were any new equipment or medical supplies ordered?  No What is the name of the medical supply agency? N/a Were you able to get the supplies/equipment? not applicable Do you have any questions related to the use of the equipment or supplies? No  Functional Questionnaire: (I = Independent and D = Dependent) ADLs: i  Bathing/Dressing- i  Meal Prep- i  Eating- i  Maintaining continence- i  Transferring/Ambulation- i  Managing Meds- i  Follow up appointments reviewed:  PCP Hospital f/u appt confirmed? Yes  Scheduled to see [place] on 05/29/22 . [facility] f/u appt confirmed? No   Are transportation arrangements needed? No  If their condition worsens, is the pt aware to call PCP or go to the Emergency Dept.? Yes Was the patient provided with contact information for the PCP's office or ED? Yes Was to pt encouraged to call back with questions or concerns? Yes

## 2022-05-29 ENCOUNTER — Ambulatory Visit (INDEPENDENT_AMBULATORY_CARE_PROVIDER_SITE_OTHER): Payer: PPO | Admitting: Internal Medicine

## 2022-05-29 ENCOUNTER — Encounter: Payer: Self-pay | Admitting: Internal Medicine

## 2022-05-29 VITALS — BP 128/78 | HR 66 | Ht 66.0 in | Wt 256.2 lb

## 2022-05-29 DIAGNOSIS — I10 Essential (primary) hypertension: Secondary | ICD-10-CM

## 2022-05-29 DIAGNOSIS — E1159 Type 2 diabetes mellitus with other circulatory complications: Secondary | ICD-10-CM

## 2022-05-29 DIAGNOSIS — N179 Acute kidney failure, unspecified: Secondary | ICD-10-CM

## 2022-05-29 DIAGNOSIS — N390 Urinary tract infection, site not specified: Secondary | ICD-10-CM

## 2022-05-29 DIAGNOSIS — A419 Sepsis, unspecified organism: Secondary | ICD-10-CM | POA: Diagnosis not present

## 2022-05-29 MED ORDER — LISINOPRIL 5 MG PO TABS: 5.0000 mg | ORAL_TABLET | Freq: Every day | ORAL | 3 refills | Status: DC

## 2022-05-29 MED ORDER — METFORMIN HCL 500 MG PO TABS: 500.0000 mg | ORAL_TABLET | Freq: Two times a day (BID) | ORAL | 3 refills | Status: DC

## 2022-05-29 NOTE — Patient Instructions (Signed)
It was a pleasure to see you today.  Thank you for giving [place] the opportunity to be involved in your care.  Below is a brief recap of your visit and next steps.  We will plan to see you again in 4 weeks.  Summary We are adding back lisinopril 5 mg daily and metformin 500 mg twice daily  Next steps Plan for follow up in 4 weeks

## 2022-05-29 NOTE — Assessment & Plan Note (Signed)
Baseline Cr 1.1-1.2, increased to 2.2 upon hospital admission.  Prerenal etiology in the setting of sepsis.  This resolved with aggressive fluid resuscitation. -Plan for repeat labs at follow-up in 4 weeks

## 2022-05-29 NOTE — Assessment & Plan Note (Signed)
Admitted to [facility] 8/18 after presenting with generalized weakness and malaise in addition to increased urinary frequency.  Meeting SIRS criteria with tachypnea, hypotension, and evidence of endorgan damage (AKI).  Urinary source identified.  He was empirically treated with Rocephin and then transition to Cipro prior to hospital discharge 8/20.  He reports feeling well today.  He completed his Cipro yesterday (8/27).  Blood cultures remain without growth today during hospitalization.  Urine culture was polymicrobial. -Antibiotics completed and symptoms resolved -He was referred to urology upon discharge and has an appointment scheduled tomorrow (8/29).

## 2022-05-29 NOTE — Assessment & Plan Note (Signed)
Most recent HbA1c 7.5.  He was managed with sliding scale insulin during admission.  Prior to admission he was taking Synjardy, however he was instructed to stop taking this medication upon discharge due to SGLT2 component. -Restart metformin 500 mg twice daily today.  Can further increase to 1000 mg twice daily pending tolerance.

## 2022-05-29 NOTE — Assessment & Plan Note (Signed)
Antihypertensive medications held during admission due to hypotension in the setting of sepsis.  His prehospital regimen included Zestoretic, verapamil, and spironolactone.  Zestoretic was discontinued upon hospital discharge.  BP today 128/78.  He has resumed taking verapamil and spironolactone. -Add lisinopril 5 mg daily today in the setting of diabetes mellitus.  Plan for repeat labs in 4 weeks.

## 2022-05-29 NOTE — Progress Notes (Signed)
Established Patient Office Visit  Subjective   Patient ID: Omar [name], male    DOB: December 13, 1945  Age: 76 y.o. MRN: [id number]  Chief Complaint  Patient presents with   Transitions Of Care    Needs clarification on meds   Omar [name] is a 76 year old male with a past medical history significant for T2DM, HTN, HLD, factor V Leiden mutation, and CAD.  He was recently admitted to [facility] 8/18 after presenting with generalized weakness, malaise, and increased frequency of urination.  Subsequently found to meet sepsis criteria secondary to UTI.  He is empirically treated with Rocephin and then transition to ciprofloxacin.  He was discharged on 8/20.  His hospital course was notable for AKI and hyponatremia, both of which resolved with aggressive fluid resuscitation.  Today [name] is feeling well.  He states that his urinary symptoms have resolved and his energy level has improved.  He is accompanied by his wife.  They would like to discuss some medication changes that were made upon discharge.  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 Merlene Laughter interpreted the study-CPAP recommended, but refused by patient   Stroke [facility])    no deficits from  stroke, "Didnt know I had one when they say I did".   Past Surgical History:  Procedure Laterality Date   BIOPSY  04/16/2019   Procedure: BIOPSY;  Surgeon: Danie Binder, MD;  [facility];  Service: Endoscopy;;   CATARACT EXTRACTION W/PHACO Left 04/03/2016   Procedure: CATARACT EXTRACTION PHACO AND INTRAOCULAR LENS PLACEMENT LEFT EYE CDE=21.76;  Surgeon: Williams Che, MD;  Location: [facility] ORS;  Service: Ophthalmology;  Laterality: Left;   CATARACT EXTRACTION W/PHACO Right 06/19/2016   Procedure: CATARACT EXTRACTION PHACO AND INTRAOCULAR LENS PLACEMENT; CDE:  11.56;  Surgeon: Williams Che, MD;  Location: [facility] ORS;  Service: Ophthalmology;  Laterality: Right;   COLONOSCOPY N/A 04/16/2019   Procedure: COLONOSCOPY;  Surgeon: Danie Binder, MD;  [facility];  Service: Endoscopy;  Laterality: N/A;  8:30am   COLONOSCOPY W/ POLYPECTOMY  05/2008   polypectomy x4-adenomatous; internal hemorrhoids   CORONARY ANGIOPLASTY WITH STENT PLACEMENT  06/04/2012     DES    to [place]     x 1   DUPUYTREN / PALMAR FASCIOTOMY  2009   rt hand-dsc-went home same day x2   ESOPHAGOGASTRODUODENOSCOPY N/A 04/16/2019   Procedure: ESOPHAGOGASTRODUODENOSCOPY (EGD);  Surgeon: Danie Binder, MD;  [facility];  Service: Endoscopy;  Laterality: N/A;   LEFT HEART CATHETERIZATION WITH CORONARY ANGIOGRAM N/A 05/28/2012   Procedure:  LEFT HEART CATHETERIZATION WITH CORONARY ANGIOGRAM;  Surgeon: Laverda Page, MD;  Location: [facility] CATH LAB;  Service: Cardiovascular;  Laterality: N/A;   PERCUTANEOUS CORONARY STENT INTERVENTION (PCI-S) N/A 06/04/2012   Procedure: PERCUTANEOUS CORONARY STENT INTERVENTION (PCI-S);  Surgeon: Laverda Page, MD;  Location: [facility] CATH LAB;  Service: Cardiovascular;  Laterality: N/A;   POLYPECTOMY  04/16/2019   Procedure: POLYPECTOMY;  Surgeon: Danie Binder, MD;  [facility];  Service: Endoscopy;;   TONSILLECTOMY     TOTAL HIP  ARTHROPLASTY Right 06/24/2019   Procedure: TOTAL HIP ARTHROPLASTY;  Surgeon: Marchia Bond, MD;  Location: [facility] ORS;  Service: Orthopedics;  Laterality: Right;   VENTRAL HERNIA REPAIR  2010   umb hernia-[facility]-went home same day   Social History   Tobacco Use   Smoking status: Never   Smokeless tobacco: Never  Vaping Use   Vaping Use: Never used  Substance Use Topics   Alcohol use: No   Drug use: No   Family History  Problem Relation Age of Onset   Cancer Mother    Brain cancer Mother    Cancer Father    Prostate cancer Father    Breast cancer Daughter    Thyroid cancer Daughter    Coronary artery disease Neg Hx    Colon cancer Neg Hx    No Known Allergies  Review of Systems  Constitutional:  Negative for chills and fever.  HENT:  Negative for sore throat.   Respiratory:  Negative for cough and shortness of breath.   Cardiovascular:  Negative for chest pain, palpitations and leg swelling.  Gastrointestinal:  Negative for abdominal pain, blood in stool, constipation, diarrhea, nausea and vomiting.  Genitourinary:  Negative for dysuria and hematuria.  Musculoskeletal:  Negative for myalgias.  Skin:  Negative for itching and rash.  Neurological:  Negative for dizziness and headaches.  Psychiatric/Behavioral:  Negative for depression and suicidal ideas.      Objective:     BP 128/78   Pulse 66   Ht '5\' 6"'$  (1.676 m)   Wt 256 lb 3.2 oz (116.2 kg)   SpO2 92%   BMI 41.35 kg/m   Physical Exam Vitals reviewed.  Constitutional:      General: He is not in acute distress.    Appearance: Normal appearance. He is obese. He is not ill-appearing.  HENT:     Head: Normocephalic and atraumatic.     Nose: Nose normal. No congestion or rhinorrhea.     Mouth/Throat:     Mouth: Mucous membranes are moist.     Pharynx: Oropharynx is clear.  Eyes:     Extraocular Movements: Extraocular movements intact.     Conjunctiva/sclera: Conjunctivae normal.     Pupils: Pupils are equal,  round, and reactive to light.  Cardiovascular:     Rate and Rhythm: Normal rate and regular rhythm.     Pulses: Normal pulses.     Heart sounds: Murmur heard.  Pulmonary:     Effort: Pulmonary effort is normal.     Breath sounds: Normal breath sounds. No wheezing, rhonchi or rales.  Abdominal:     General: Abdomen is flat. Bowel sounds are normal. There is no distension.     Palpations: Abdomen is soft.     Tenderness: There is no abdominal tenderness.  Musculoskeletal:        General: No swelling or deformity. Normal range of motion.     Cervical back: Normal range of motion.  Skin:  General: Skin is warm and dry.     Capillary Refill: Capillary refill takes less than 2 seconds.  Neurological:     General: No focal deficit present.     Mental Status: He is alert and oriented to person, place, and time.     Motor: No weakness.  Psychiatric:        Mood and Affect: Mood normal.        Behavior: Behavior normal.        Thought Content: Thought content normal.     No results found for any visits on 05/29/22.  Last CBC Lab Results  Component Value Date   WBC 7.5 05/21/2022   HGB 13.5 05/21/2022   HCT 40.7 05/21/2022   MCV 90.2 05/21/2022   MCH 29.9 05/21/2022   RDW 14.2 05/21/2022   PLT 195 36/64/4034   Last metabolic panel Lab Results  Component Value Date   GLUCOSE 140 (H) 05/21/2022   NA 138 05/21/2022   K 3.9 05/21/2022   CL 107 05/21/2022   CO2 23 05/21/2022   BUN 20 05/21/2022   CREATININE 1.22 05/21/2022   GFRNONAA >60 05/21/2022   CALCIUM 8.5 (L) 05/21/2022   PHOS 2.9 08/07/2017   PROT 6.9 05/19/2022   ALBUMIN 3.6 05/19/2022   LABGLOB 2.0 11/02/2021   AGRATIO 2.1 11/02/2021   BILITOT 0.6 05/19/2022   ALKPHOS 48 05/19/2022   AST 20 05/19/2022   ALT 15 05/19/2022   ANIONGAP 8 05/21/2022   Last lipids Lab Results  Component Value Date   CHOL 151 01/27/2022   HDL 53 01/27/2022   LDLCALC 70 01/27/2022   TRIG 164 (H) 01/27/2022   CHOLHDL 2.8  01/27/2022   Last hemoglobin A1c Lab Results  Component Value Date   HGBA1C 7.5 (H) 05/19/2022   Last thyroid functions Lab Results  Component Value Date   TSH 1.630 01/27/2022   T4TOTAL 8.1 10/08/2019   Last vitamin D Lab Results  Component Value Date   VD25OH 90.2 01/27/2022   Last vitamin B12 and Folate Lab Results  Component Value Date   VITAMINB12 231 03/11/2018      The 10-year ASCVD risk score (Arnett DK, et al., 2019) is: 51.2%* (Cholesterol units were assumed)    Assessment & Plan:   Problem List Items Addressed This Visit       Cardiovascular and Mediastinum   Essential hypertension    Antihypertensive medications held during admission due to hypotension in the setting of sepsis.  His prehospital regimen included Zestoretic, verapamil, and spironolactone.  Zestoretic was discontinued upon hospital discharge.  BP today 128/78.  He has resumed taking verapamil and spironolactone. -Add lisinopril 5 mg daily today in the setting of diabetes mellitus.  Plan for repeat labs in 4 weeks.      Relevant Medications   lisinopril (ZESTRIL) 5 MG tablet   Type 2 diabetes mellitus with vascular disease ([place]) - Primary    Most recent HbA1c 7.5.  He was managed with sliding scale insulin during admission.  Prior to admission he was taking Synjardy, however he was instructed to stop taking this medication upon discharge due to SGLT2 component. -Restart metformin 500 mg twice daily today.  Can further increase to 1000 mg twice daily pending tolerance.      Relevant Medications   lisinopril (ZESTRIL) 5 MG tablet   metFORMIN (GLUCOPHAGE) 500 MG tablet     Genitourinary   AKI (acute kidney injury) ([place])    Baseline Cr 1.1-1.2, increased to 2.2  upon hospital admission.  Prerenal etiology in the setting of sepsis.  This resolved with aggressive fluid resuscitation. -Plan for repeat labs at follow-up in 4 weeks        Other   Sepsis secondary to UTI [facility])    Admitted to  [facility] 8/18 after presenting with generalized weakness and malaise in addition to increased urinary frequency.  Meeting SIRS criteria with tachypnea, hypotension, and evidence of endorgan damage (AKI).  Urinary source identified.  He was empirically treated with Rocephin and then transition to Cipro prior to hospital discharge 8/20.  He reports feeling well today.  He completed his Cipro yesterday (8/27).  Blood cultures remain without growth today during hospitalization.  Urine culture was polymicrobial. -Antibiotics completed and symptoms resolved -He was referred to urology upon discharge and has an appointment scheduled tomorrow (8/29).       Return in about 4 weeks (around 06/26/2022) for HTN follow up.    Johnette Abraham, MD

## 2022-05-30 ENCOUNTER — Ambulatory Visit (INDEPENDENT_AMBULATORY_CARE_PROVIDER_SITE_OTHER): Payer: PPO | Admitting: Urology

## 2022-05-30 ENCOUNTER — Other Ambulatory Visit: Payer: Self-pay

## 2022-05-30 ENCOUNTER — Encounter: Payer: Self-pay | Admitting: Internal Medicine

## 2022-05-30 VITALS — BP 135/87 | HR 76

## 2022-05-30 DIAGNOSIS — E1159 Type 2 diabetes mellitus with other circulatory complications: Secondary | ICD-10-CM

## 2022-05-30 DIAGNOSIS — N390 Urinary tract infection, site not specified: Secondary | ICD-10-CM

## 2022-05-30 DIAGNOSIS — E291 Testicular hypofunction: Secondary | ICD-10-CM

## 2022-05-30 DIAGNOSIS — Z87440 Personal history of urinary (tract) infections: Secondary | ICD-10-CM | POA: Diagnosis not present

## 2022-05-30 DIAGNOSIS — I10 Essential (primary) hypertension: Secondary | ICD-10-CM

## 2022-05-30 LAB — URINALYSIS, ROUTINE W REFLEX MICROSCOPIC
Bilirubin, UA: NEGATIVE
Ketones, UA: NEGATIVE
Leukocytes,UA: NEGATIVE
Nitrite, UA: NEGATIVE
Protein,UA: NEGATIVE
RBC, UA: NEGATIVE
Specific Gravity, UA: 1.015 (ref 1.005–1.030)
Urobilinogen, Ur: 0.2 mg/dL (ref 0.2–1.0)
pH, UA: 5 (ref 5.0–7.5)

## 2022-05-30 LAB — BLADDER SCAN AMB NON-IMAGING: Scan Result: 70

## 2022-05-30 MED ORDER — METFORMIN HCL 500 MG PO TABS: 500.0000 mg | ORAL_TABLET | Freq: Two times a day (BID) | ORAL | 3 refills | Status: DC

## 2022-05-30 MED ORDER — LISINOPRIL 5 MG PO TABS: 5.0000 mg | ORAL_TABLET | Freq: Every day | ORAL | 3 refills | Status: DC

## 2022-05-30 MED ORDER — TESTOSTERONE CYPIONATE 200 MG/ML IM SOLN
100.0000 mg | Freq: Once | INTRAMUSCULAR | Status: AC
  Administered 2022-05-30: 100 mg via INTRAMUSCULAR

## 2022-05-30 NOTE — Progress Notes (Signed)
H&P  Chief Complaint: Follow-up of recent febrile UTI necessitating hospitalization  History of Present Illness: 76 year old male seen here for hypogonadism.  He is due his testosterone injection today.  He had a 2-day hospitalization earlier this month for a febrile urinary tract infection.  Urine culture grew 30,000 units of mixed organisms, and nothing specific.  Lactic acid levels upon presentation to the emergency room were normal.  Blood cultures negative.  He was sent home on oral antibiotics.  For a week prior to his hospitalization he had easy fatigue, dragging feeling, frequent urination, perhaps dysuria.  No gross hematuria.  No prior history of urinary tract infections.  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 Merlene Laughter interpreted the study-CPAP recommended, but refused by patient   Stroke [facility])    no deficits from stroke, "Didnt know I had one when they say I did".    Past Surgical History:  Procedure Laterality Date   BIOPSY  04/16/2019   Procedure: BIOPSY;  Surgeon: Danie Binder, MD;  [facility];  Service: Endoscopy;;   CATARACT EXTRACTION W/PHACO Left 04/03/2016   Procedure: CATARACT EXTRACTION PHACO AND INTRAOCULAR LENS PLACEMENT  LEFT EYE CDE=21.76;  Surgeon: Williams Che, MD;  Location: [facility];  Service: Ophthalmology;  Laterality: Left;   CATARACT EXTRACTION W/PHACO Right 06/19/2016   Procedure: CATARACT EXTRACTION PHACO AND INTRAOCULAR LENS PLACEMENT; CDE:  11.56;  Surgeon: Williams Che, MD;  Location: [facility];  Service: Ophthalmology;  Laterality: Right;   COLONOSCOPY N/A 04/16/2019   Procedure: COLONOSCOPY;  Surgeon: Danie Binder, MD;  [facility];  Service: Endoscopy;  Laterality: N/A;  8:30am   COLONOSCOPY W/ POLYPECTOMY  05/2008   polypectomy x4-adenomatous; internal hemorrhoids   CORONARY ANGIOPLASTY WITH STENT PLACEMENT  06/04/2012     DES    to [place]     x 1   DUPUYTREN / PALMAR FASCIOTOMY  2009   rt hand-dsc-went home same day x2   ESOPHAGOGASTRODUODENOSCOPY N/A 04/16/2019   Procedure: ESOPHAGOGASTRODUODENOSCOPY (EGD);  Surgeon: Danie Binder, MD;  [facility];  Service: Endoscopy;  Laterality: N/A;   LEFT HEART CATHETERIZATION WITH CORONARY ANGIOGRAM N/A 05/28/2012   Procedure: LEFT HEART CATHETERIZATION WITH CORONARY ANGIOGRAM;  Surgeon: Laverda Page, MD;  Location: [facility] CATH LAB;  Service: Cardiovascular;  Laterality: N/A;   PERCUTANEOUS CORONARY STENT INTERVENTION (PCI-S) N/A 06/04/2012   Procedure: PERCUTANEOUS CORONARY STENT INTERVENTION (PCI-S);  Surgeon: Laverda Page, MD;  Location: [facility] CATH LAB;  Service: Cardiovascular;  Laterality: N/A;   POLYPECTOMY  04/16/2019   Procedure: POLYPECTOMY;  Surgeon:  Danie Binder, MD;  [facility];  Service: Endoscopy;;   TONSILLECTOMY     TOTAL HIP ARTHROPLASTY Right 06/24/2019   Procedure: TOTAL HIP ARTHROPLASTY;  Surgeon: Marchia Bond, MD;  [facility];  Service: Orthopedics;  Laterality: Right;   VENTRAL HERNIA REPAIR  2010   umb hernia-[facility]-went home same day    Home Medications:  Allergies as of 05/30/2022   No Known Allergies      Medication List        Accurate as of  May 30, 2022  9:30 AM. If you have any questions, ask your nurse or doctor.          acebutolol 200 MG capsule Commonly known as: SECTRAL TAKE (1) CAPSULE BY MOUTH TWICE DAILY.   apixaban 5 MG Tabs tablet Commonly known as: ELIQUIS Take 5 mg by mouth 2 (two) times daily.   cetirizine 10 MG tablet Commonly known as: ZyrTEC Allergy Take 1 tablet (10 mg total) by mouth daily.   fluticasone 50 MCG/ACT nasal spray Commonly known as: FLONASE Place 1 spray into both nostrils daily for 14 days.   lactobacillus acidophilus & bulgar chewable tablet Chew 1 tablet by mouth 3 (three) times daily with meals for 10 days.   lisinopril 5 MG tablet Commonly known as: ZESTRIL Take 1 tablet (5 mg total) by mouth daily.   metFORMIN 500 MG tablet Commonly known as: GLUCOPHAGE Take 1 tablet (500 mg total) by mouth 2 (two) times daily with a meal.   nitroGLYCERIN 0.4 MG SL tablet Commonly known as: NITROSTAT PLACE 1 TAB UNDER TONGUE EVERY 5 MIN IF NEEDED FOR CHEST PAIN. MAY USE 3 TIMES.NO RELIEF CALL 911.   omeprazole 20 MG capsule Commonly known as: PRILOSEC TAKE 1 CAPSULE 30 MINUTES BEFORE BREAKFAST.   ONE-A-DAY 50 PLUS PO Take 1 tablet by mouth at bedtime.   rosuvastatin 20 MG tablet Commonly known as: CRESTOR Take 1 tablet (20 mg total) by mouth daily.   spironolactone 50 MG tablet Commonly known as: Aldactone Take 1 tablet (50 mg total) by mouth every morning.   tamsulosin 0.4 MG Caps capsule Commonly known as: FLOMAX Take 0.4 mg by mouth daily.   testosterone cypionate 200 MG/ML injection Commonly known as: DEPOTESTOSTERONE CYPIONATE Inject 0.5 mLs (100 mg total) into the muscle every 7 (seven) days. What changed: when to take this   torsemide 20 MG tablet Commonly known as: DEMADEX Take 20 mg by mouth daily as needed (swelling).   verapamil 240 MG CR tablet Commonly known as: CALAN-SR Take 1 tablet (240 mg total) by mouth at bedtime.        Allergies: No  Known Allergies  Family History  Problem Relation Age of Onset   Cancer Mother    Brain cancer Mother    Cancer Father    Prostate cancer Father    Breast cancer Daughter    Thyroid cancer Daughter    Coronary artery disease Neg Hx    Colon cancer Neg Hx     Social History:  reports that he has never smoked. He has never used smokeless tobacco. He reports that he does not drink alcohol and does not use drugs.  ROS: A complete review of systems was performed.  All systems are negative except for pertinent findings as noted.  Physical Exam:  Vital signs in last 24 hours: BP 135/87   Pulse 76  Constitutional:  Alert and oriented, No acute distress Cardiovascular: Regular rate  Respiratory: Normal respiratory  effort GI: Abdomen is soft, nontender, nondistended, no abdominal masses. No CVAT.  Genitourinary: Normal male phallus, testes are descended bilaterally and non-tender and without masses, scrotum is normal in appearance without lesions or masses, perineum is normal on inspection. Lymphatic: No lymphadenopathy Neurologic: Grossly intact, no focal deficits Psychiatric: Normal mood and affect  I have reviewed prior pt notes  I have reviewed notes from referring/previous physicians  I have reviewed urinalysis results--both in the hospital emergency room and today  I have independently reviewed prior imaging--bladder scan volume 70 mL  I have reviewed prior PSA results--recently normal  I have reviewed prior urine culture   Impression/Assessment:  1.  Recent febrile UTI, first UTI event in this patient.  2.  Hypogonadism, on every other week testosterone administration, doing well with this  Plan:  1.  He was reassured that his urine is fine, he empties well, and this was most likely a sporadic UTI without underlying abnormalities  2.  Continue same injection scheduled for his testosterone, same follow-up here

## 2022-05-30 NOTE — Progress Notes (Signed)
Patient received IM testosterone today without any difficulty.

## 2022-05-31 ENCOUNTER — Ambulatory Visit: Payer: PPO | Admitting: Physician Assistant

## 2022-06-13 ENCOUNTER — Ambulatory Visit (INDEPENDENT_AMBULATORY_CARE_PROVIDER_SITE_OTHER): Payer: PPO | Admitting: Physician Assistant

## 2022-06-13 DIAGNOSIS — E291 Testicular hypofunction: Secondary | ICD-10-CM | POA: Diagnosis not present

## 2022-06-13 MED ORDER — TESTOSTERONE CYPIONATE 200 MG/ML IM SOLN
200.0000 mg | Freq: Once | INTRAMUSCULAR | Status: AC
  Administered 2022-06-13: 200 mg via INTRAMUSCULAR

## 2022-06-13 NOTE — Progress Notes (Signed)
Testosterone IM Injection  Due to Hypogonadism patient is present today for a Testosterone Injection.  Medication: Testosterone Cypionate Dose: 200 Location: right upper outer buttocks Lot: [id number] Exp:09/2023  Patient tolerated well, no complications were noted  Performed by: Levi Aland, CMA  Follow up: Follow up in 2 weeks for next injection.

## 2022-06-22 ENCOUNTER — Ambulatory Visit (INDEPENDENT_AMBULATORY_CARE_PROVIDER_SITE_OTHER): Payer: PPO | Admitting: Internal Medicine

## 2022-06-22 ENCOUNTER — Encounter: Payer: Self-pay | Admitting: Internal Medicine

## 2022-06-22 VITALS — BP 130/72 | HR 69 | Ht 68.0 in | Wt 271.0 lb

## 2022-06-22 DIAGNOSIS — I10 Essential (primary) hypertension: Secondary | ICD-10-CM | POA: Diagnosis not present

## 2022-06-22 DIAGNOSIS — E1159 Type 2 diabetes mellitus with other circulatory complications: Secondary | ICD-10-CM | POA: Diagnosis not present

## 2022-06-22 DIAGNOSIS — M7989 Other specified soft tissue disorders: Secondary | ICD-10-CM | POA: Insufficient documentation

## 2022-06-22 DIAGNOSIS — Z23 Encounter for immunization: Secondary | ICD-10-CM | POA: Diagnosis not present

## 2022-06-22 DIAGNOSIS — Z0001 Encounter for general adult medical examination with abnormal findings: Secondary | ICD-10-CM

## 2022-06-22 DIAGNOSIS — Z0000 Encounter for general adult medical examination without abnormal findings: Secondary | ICD-10-CM

## 2022-06-22 NOTE — Patient Instructions (Signed)
It was a pleasure to see you today.  Thank you for giving [place] the opportunity to be involved in your care.  Below is a brief recap of your visit and next steps.  We will plan to see you again in 3 months  Summary We will check labs today and you will receive your flu shot.  Continue your current medications  Next steps Follow up in 3 months

## 2022-06-22 NOTE — Assessment & Plan Note (Signed)
BP 130/72 today.  His current regimen consist of lisinopril 5 mg daily, spironolactone 50 mg daily, and verapamil 240 mg daily.  He states that he has not taken his blood pressure medications yet today.  He has not experienced symptoms of orthostasis since resuming lisinopril.  His wife checks his blood pressure daily at home and keeps a log with her, however she is not present today. -No changes today.  Continue current regimen -Repeat BMP since restarting lisinopril.

## 2022-06-22 NOTE — Progress Notes (Signed)
Established Patient Office Visit  Subjective   Patient ID: Omar [name], male    DOB: 13-Dec-1945  Age: 76 y.o. MRN: [id number]  Chief Complaint  Patient presents with   Follow-up    4 weeks, htn   Omar [name] is a 76 year old gentleman returning to care today.  He has a past medical history significant for T2DM, HTN, HLD, factor V Leiden mutation, and CAD.  Last seen by me on 8/28 for a TOC visit in the setting of recent hospital admission for sepsis secondary to UTI.  4-week follow-up arranged for HTN as lisinopril was restarted.  Metformin was also restarted for T2DM.  Omar [name] states that he feels fairly well today.  His acute concern is right leg swelling.  He receives biweekly testosterone injections through his urology office for treatment of hypogonadism.  His last injection was 2 weeks ago and he has experienced pain and swelling in his right leg extremity since that time.  Pain is present mostly at the site of injection in the lateral aspect of his right thigh.  He has noted diffuse swelling in the right leg.  He is able to ambulate and denies numbness/weakness.  He states there is no significant warmth or tenderness palpation of the right leg.  He states that he has previously had blood clots in his leg and that this does not feel like a blood clot.  Acute concerns and chronic medical conditions discussed today are individually addressed in A/P below.  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 Merlene Laughter interpreted the study-CPAP recommended, but refused by patient   Stroke [facility])    no deficits from stroke, "Didnt know I had one when they say I did".   Past Surgical History:  Procedure Laterality Date   BIOPSY  04/16/2019   Procedure: BIOPSY;  Surgeon: Danie Binder, MD;  [facility];  Service: Endoscopy;;   CATARACT EXTRACTION W/PHACO Left 04/03/2016   Procedure: CATARACT EXTRACTION PHACO AND INTRAOCULAR LENS PLACEMENT LEFT EYE CDE=21.76;  Surgeon: Williams Che, MD;  Location: [facility];  Service: Ophthalmology;  Laterality: Left;   CATARACT EXTRACTION W/PHACO Right 06/19/2016   Procedure: CATARACT EXTRACTION PHACO AND INTRAOCULAR LENS PLACEMENT; CDE:  11.56;  Surgeon: Williams Che, MD;  Location: [facility];  Service: Ophthalmology;  Laterality: Right;   COLONOSCOPY N/A 04/16/2019   Procedure: COLONOSCOPY;  Surgeon: Danie Binder, MD;  [facility];  Service: Endoscopy;  Laterality: N/A;  8:30am   COLONOSCOPY W/ POLYPECTOMY  05/2008   polypectomy x4-adenomatous; internal hemorrhoids   CORONARY ANGIOPLASTY WITH STENT PLACEMENT  06/04/2012  DES    to [place]     x 1   DUPUYTREN / PALMAR FASCIOTOMY  2009   rt hand-dsc-went home same day x2   ESOPHAGOGASTRODUODENOSCOPY N/A 04/16/2019   Procedure: ESOPHAGOGASTRODUODENOSCOPY (EGD);  Surgeon: Danie Binder, MD;  [facility];  Service: Endoscopy;  Laterality: N/A;   LEFT HEART CATHETERIZATION WITH CORONARY ANGIOGRAM N/A 05/28/2012   Procedure: LEFT HEART CATHETERIZATION WITH CORONARY ANGIOGRAM;  Surgeon: Laverda Page, MD;  Location: [facility] CATH LAB;  Service: Cardiovascular;  Laterality: N/A;   PERCUTANEOUS CORONARY STENT INTERVENTION  (PCI-S) N/A 06/04/2012   Procedure: PERCUTANEOUS CORONARY STENT INTERVENTION (PCI-S);  Surgeon: Laverda Page, MD;  Location: [facility] CATH LAB;  Service: Cardiovascular;  Laterality: N/A;   POLYPECTOMY  04/16/2019   Procedure: POLYPECTOMY;  Surgeon: Danie Binder, MD;  [facility];  Service: Endoscopy;;   TONSILLECTOMY     TOTAL HIP ARTHROPLASTY Right 06/24/2019   Procedure: TOTAL HIP ARTHROPLASTY;  Surgeon: Marchia Bond, MD;  Location: [facility];  Service: Orthopedics;  Laterality: Right;   VENTRAL HERNIA REPAIR  2010   umb hernia-[facility]-went home same day   Social History   Tobacco Use   Smoking status: Never   Smokeless tobacco: Never  Vaping Use   Vaping Use: Never used  Substance Use Topics   Alcohol use: No   Drug use: No   Family History  Problem Relation Age of Onset   Cancer Mother    Brain cancer Mother    Cancer Father    Prostate cancer Father    Breast cancer Daughter    Thyroid cancer Daughter    Coronary artery disease Neg Hx    Colon cancer Neg Hx    No Known Allergies  Review of Systems  Musculoskeletal:        Right leg swelling  All other systems reviewed and are negative.    Objective:     BP 130/72   Pulse 69   Ht '5\' 8"'$  (1.727 m)   Wt 271 lb (122.9 kg)   SpO2 96%   BMI 41.21 kg/m  BP Readings from Last 3 Encounters:  06/22/22 130/72  05/30/22 135/87  05/29/22 128/78      Physical Exam Constitutional:      General: He is not in acute distress.    Appearance: Normal appearance. He is obese. He is not toxic-appearing.  Cardiovascular:     Rate and Rhythm: Normal rate and regular rhythm.     Pulses: Normal pulses.     Heart sounds: Normal heart sounds. No murmur heard.    No friction rub. No gallop.  Pulmonary:     Effort: Pulmonary effort is normal.     Breath sounds: Normal breath sounds. No wheezing, rhonchi or rales.  Abdominal:     General: Abdomen is flat. Bowel sounds are normal. There is no distension.     Palpations:  Abdomen is soft.     Tenderness: There is no abdominal tenderness.  Musculoskeletal:     Comments: The right leg appears mildly swollen compared to the left with non-pitting edema.  There is no significant erythema or discoloration appreciated.  No significant warmth in the right leg compared to left.  There is mild tenderness to palpation over the lateral portion of the right leg where the patient states he most recently received a testosterone injection.  Negative Homans' sign.  Skin:    General: Skin is warm and dry.  Neurological:     Mental Status: He is alert.    Last CBC Lab Results  Component Value Date   WBC 7.5 05/21/2022   HGB 13.5 05/21/2022   HCT 40.7 05/21/2022   MCV 90.2 05/21/2022   MCH 29.9 05/21/2022   RDW 14.2 05/21/2022   PLT 195 78/58/8502   Last metabolic panel Lab Results  Component Value Date   GLUCOSE 140 (H) 05/21/2022   NA 138 05/21/2022   K 3.9 05/21/2022   CL 107 05/21/2022   CO2 23 05/21/2022   BUN 20 05/21/2022   CREATININE 1.22 05/21/2022   GFRNONAA >60 05/21/2022   CALCIUM 8.5 (L) 05/21/2022   PHOS 2.9 08/07/2017   PROT 6.9 05/19/2022   ALBUMIN 3.6 05/19/2022   LABGLOB 2.0 11/02/2021   AGRATIO 2.1 11/02/2021   BILITOT 0.6 05/19/2022   ALKPHOS 48 05/19/2022   AST 20 05/19/2022   ALT 15 05/19/2022   ANIONGAP 8 05/21/2022   Last lipids Lab Results  Component Value Date   CHOL 151 01/27/2022   HDL 53 01/27/2022   LDLCALC 70 01/27/2022   TRIG 164 (H) 01/27/2022   CHOLHDL 2.8 01/27/2022   Last hemoglobin A1c Lab Results  Component Value Date   HGBA1C 7.5 (H) 05/19/2022   Last thyroid functions Lab Results  Component Value Date   TSH 1.630 01/27/2022   T4TOTAL 8.1 10/08/2019   Last vitamin D Lab Results  Component Value Date   VD25OH 90.2 01/27/2022   Last vitamin B12 and Folate Lab Results  Component Value Date   VITAMINB12 231 03/11/2018   The 10-year ASCVD risk score (Arnett DK, et al., 2019) is: 54.8%*  (Cholesterol units were assumed)    Assessment & Plan:   Problem List Items Addressed This Visit       Essential hypertension - Primary    BP 130/72 today.  His current regimen consist of lisinopril 5 mg daily, spironolactone 50 mg daily, and verapamil 240 mg daily.  He states that he has not taken his blood pressure medications yet today.  He has not experienced symptoms of orthostasis since resuming lisinopril.  His wife checks his blood pressure daily at home and keeps a log with her, however she is not present today. -No changes today.  Continue current regimen -Repeat BMP since restarting lisinopril.      Type 2 diabetes mellitus with vascular disease ([place])    Metformin restarted at last appointment.  He denies experiencing any side effects. -Repeat BMP today      Right leg swelling    As noted above, he endorses onset of right lower extremity swelling after testosterone injection 2 weeks ago.  There is diffuse swelling appreciated without significant erythema or tenderness palpation.  Negative Homans' sign.  Low concern for DVT as he is currently anticoagulated with Eliquis.  He also states that this does not feel like DVTs he has had previously. -Treatment options discussed.  Mr. Muse would like to hold off on any further work-up, such as an ultrasound, for now.  Checking labs today.  He will return to care if his swelling does not improve.      Preventative health care    Influenza vaccine administered today      Return in about 3 months (around 09/21/2022).    Johnette Abraham, MD

## 2022-06-22 NOTE — Assessment & Plan Note (Signed)
As noted above, he endorses onset of right lower extremity swelling after testosterone injection 2 weeks ago.  There is diffuse swelling appreciated without significant erythema or tenderness palpation.  Negative Homans' sign.  Low concern for DVT as he is currently anticoagulated with Eliquis.  He also states that this does not feel like DVTs he has had previously. -Treatment options discussed.  Omar [name] would like to hold off on any further work-up, such as an ultrasound, for now.  Checking labs today.  He will return to care if his swelling does not improve.

## 2022-06-22 NOTE — Assessment & Plan Note (Signed)
Influenza vaccine administered today.

## 2022-06-22 NOTE — Assessment & Plan Note (Signed)
Metformin restarted at last appointment.  He denies experiencing any side effects. -Repeat BMP today

## 2022-06-23 LAB — BASIC METABOLIC PANEL
BUN/Creatinine Ratio: 9 — ABNORMAL LOW (ref 10–24)
BUN: 9 mg/dL (ref 8–27)
CO2: 19 mmol/L — ABNORMAL LOW (ref 20–29)
Calcium: 9.1 mg/dL (ref 8.6–10.2)
Chloride: 104 mmol/L (ref 96–106)
Creatinine, Ser: 1.03 mg/dL (ref 0.76–1.27)
Glucose: 162 mg/dL — ABNORMAL HIGH (ref 70–99)
Potassium: 4.3 mmol/L (ref 3.5–5.2)
Sodium: 142 mmol/L (ref 134–144)
eGFR: 74 mL/min/{1.73_m2} (ref >59)

## 2022-06-28 ENCOUNTER — Ambulatory Visit: Payer: PPO | Admitting: Physician Assistant

## 2022-06-30 ENCOUNTER — Other Ambulatory Visit: Payer: Self-pay

## 2022-06-30 ENCOUNTER — Telehealth: Payer: Self-pay | Admitting: Internal Medicine

## 2022-06-30 ENCOUNTER — Telehealth: Payer: Self-pay

## 2022-06-30 ENCOUNTER — Ambulatory Visit (HOSPITAL_COMMUNITY)
Admission: RE | Admit: 2022-06-30 | Discharge: 2022-06-30 | Disposition: A | Discharge status: Home / Self Care | Payer: PPO | Source: Ambulatory Visit | Attending: Internal Medicine | Admitting: Internal Medicine

## 2022-06-30 DIAGNOSIS — M7989 Other specified soft tissue disorders: Secondary | ICD-10-CM | POA: Diagnosis not present

## 2022-06-30 NOTE — Telephone Encounter (Signed)
Patient spouse Carlyon Shadow returning [place] call. Call back # [phone]

## 2022-06-30 NOTE — Telephone Encounter (Signed)
Pt wife called wanting to speak to anurse about his legs

## 2022-07-05 ENCOUNTER — Ambulatory Visit: Payer: PPO

## 2022-07-07 ENCOUNTER — Other Ambulatory Visit: Payer: Self-pay | Admitting: Cardiology

## 2022-07-07 ENCOUNTER — Ambulatory Visit (INDEPENDENT_AMBULATORY_CARE_PROVIDER_SITE_OTHER): Payer: PPO | Admitting: Physician Assistant

## 2022-07-07 DIAGNOSIS — I10 Essential (primary) hypertension: Secondary | ICD-10-CM

## 2022-07-07 DIAGNOSIS — E291 Testicular hypofunction: Secondary | ICD-10-CM

## 2022-07-07 MED ORDER — TESTOSTERONE CYPIONATE 200 MG/ML IM SOLN
200.0000 mg | Freq: Once | INTRAMUSCULAR | Status: AC
  Administered 2022-07-07: 200 mg via INTRAMUSCULAR

## 2022-07-07 NOTE — Progress Notes (Signed)
Testosterone IM Injection  Due to Hypogonadism patient is present today for a Testosterone Injection.  Medication: Testosterone Cypionate Dose: 200 Location: left upper outer buttocks Lot: [id number] Exp:09/2023  Patient tolerated well, no complications were noted  Performed by: Levi Aland, CMA  Follow up: as scheduled in 2 weeks      Procedure reviewed. Agree with clinical documentation.  Julienne A Summerlin PA-C

## 2022-07-21 ENCOUNTER — Ambulatory Visit (INDEPENDENT_AMBULATORY_CARE_PROVIDER_SITE_OTHER): Payer: PPO | Admitting: Urology

## 2022-07-21 ENCOUNTER — Encounter: Payer: Self-pay | Admitting: Urology

## 2022-07-21 ENCOUNTER — Telehealth: Payer: Self-pay

## 2022-07-21 DIAGNOSIS — E291 Testicular hypofunction: Secondary | ICD-10-CM | POA: Diagnosis not present

## 2022-07-21 MED ORDER — TESTOSTERONE CYPIONATE 200 MG/ML IM SOLN
200.0000 mg | Freq: Once | INTRAMUSCULAR | Status: AC
  Administered 2022-07-21: 200 mg via INTRAMUSCULAR

## 2022-07-21 NOTE — Progress Notes (Signed)
Testosterone IM Injection  Due to Hypogonadism patient is present today for a Testosterone Injection.  Medication: Testosterone Cypionate Dose: '100mg'$  Location: right upper outer buttocks Lot: [id number] Exp:09/2023  Patient tolerated well, no complications were noted  Performed by: Levi Aland, CMA  Follow up: Follow up for next injection.  Patient did not refill rx, the vial pt provided had '100mg'$  left, pt did not receive full '200mg'$  dose.

## 2022-07-21 NOTE — Telephone Encounter (Signed)
Patient came in office today for '200mg'$  testosterone injection.  Patient did not refill rx because he was not sure if you wanted him to f/u with you or have labs checked prior to refill.  The vial he provided today only had '100mg'$  of testosterone, that is the dose the patient received today.  I informed patient that he is scheduled to follow up with you around 10/2022 and to refill rx for now and I would confirm with you that you wanted patient to continue biweekly injection until his f/u with you.  Patient will also be out of town for 3 weeks making his next injection on 11/10.  Patient also states he did not want to give himself the injection while he was away.

## 2022-08-04 ENCOUNTER — Ambulatory Visit: Payer: PPO | Admitting: Physician Assistant

## 2022-08-09 ENCOUNTER — Other Ambulatory Visit: Payer: Self-pay | Admitting: Cardiology

## 2022-08-09 DIAGNOSIS — I10 Essential (primary) hypertension: Secondary | ICD-10-CM

## 2022-08-11 ENCOUNTER — Ambulatory Visit (INDEPENDENT_AMBULATORY_CARE_PROVIDER_SITE_OTHER): Payer: PPO | Admitting: Urology

## 2022-08-11 DIAGNOSIS — E291 Testicular hypofunction: Secondary | ICD-10-CM | POA: Diagnosis not present

## 2022-08-11 MED ORDER — TESTOSTERONE CYPIONATE 200 MG/ML IM SOLN
200.0000 mg | Freq: Once | INTRAMUSCULAR | Status: AC
  Administered 2022-08-11: 200 mg via INTRAMUSCULAR

## 2022-08-11 NOTE — Progress Notes (Unsigned)
Testosterone IM Injection  Due to Hypogonadism patient is present today for a Testosterone Injection.  Medication: Testosterone Cypionate Dose: 200 Location: left upper outer buttocks Lot: [id number] Exp:01/2024  Patient tolerated well, no complications were noted  Performed by: Tarique Loveall, CMA  Follow up: Follow up as scheduled.   

## 2022-08-15 ENCOUNTER — Telehealth: Payer: Self-pay

## 2022-08-23 ENCOUNTER — Ambulatory Visit (INDEPENDENT_AMBULATORY_CARE_PROVIDER_SITE_OTHER): Payer: PPO | Admitting: Physician Assistant

## 2022-08-23 DIAGNOSIS — E291 Testicular hypofunction: Secondary | ICD-10-CM

## 2022-08-23 MED ORDER — TESTOSTERONE CYPIONATE 200 MG/ML IM SOLN
200.0000 mg | Freq: Once | INTRAMUSCULAR | Status: AC
  Administered 2022-08-23: 200 mg via INTRAMUSCULAR

## 2022-08-23 NOTE — Progress Notes (Signed)
Testosterone injection  Medication: Testosterone Dose: '200mg'$  Location: right Lot: [id number] Exp: 11/2023  Patient tolerated well, no complications were noted  Performed by: Levi Aland, CMA   Procedure reviewed. Agree with clinical documentation.  Julienne A Summerlin PA-C

## 2022-08-30 NOTE — Telephone Encounter (Signed)
Open error 

## 2022-09-06 ENCOUNTER — Ambulatory Visit (INDEPENDENT_AMBULATORY_CARE_PROVIDER_SITE_OTHER): Payer: PPO | Admitting: Urology

## 2022-09-06 DIAGNOSIS — E291 Testicular hypofunction: Secondary | ICD-10-CM | POA: Diagnosis not present

## 2022-09-06 MED ORDER — TESTOSTERONE CYPIONATE 200 MG/ML IM SOLN
200.0000 mg | Freq: Once | INTRAMUSCULAR | Status: AC
  Administered 2022-09-06: 100 mg via INTRAMUSCULAR

## 2022-09-06 NOTE — Progress Notes (Signed)
IM injection  Medication: testosterone Dose: '100mg'$  Location: right   Patient tolerated well, no complications were noted  Performed by: Estill Bamberg RN

## 2022-09-20 ENCOUNTER — Ambulatory Visit (INDEPENDENT_AMBULATORY_CARE_PROVIDER_SITE_OTHER): Payer: PPO | Admitting: Urology

## 2022-09-20 DIAGNOSIS — E291 Testicular hypofunction: Secondary | ICD-10-CM | POA: Diagnosis not present

## 2022-09-20 MED ORDER — TESTOSTERONE CYPIONATE 200 MG/ML IM SOLN
200.0000 mg | Freq: Once | INTRAMUSCULAR | Status: AC
  Administered 2022-09-20: 200 mg via INTRAMUSCULAR

## 2022-09-20 NOTE — Progress Notes (Signed)
testosterone injection  Medication: Testosterone Dose: '100mg'$  Location: right  Patient tolerated well, no complications were noted  Performed by: Estill Bamberg RN

## 2022-09-21 ENCOUNTER — Other Ambulatory Visit: Payer: Self-pay | Admitting: Cardiology

## 2022-09-21 DIAGNOSIS — I10 Essential (primary) hypertension: Secondary | ICD-10-CM

## 2022-09-22 ENCOUNTER — Ambulatory Visit (INDEPENDENT_AMBULATORY_CARE_PROVIDER_SITE_OTHER): Payer: PPO | Admitting: Internal Medicine

## 2022-09-22 ENCOUNTER — Encounter: Payer: Self-pay | Admitting: Internal Medicine

## 2022-09-22 VITALS — BP 126/78 | HR 73 | Ht 68.0 in | Wt 260.0 lb

## 2022-09-22 DIAGNOSIS — E7800 Pure hypercholesterolemia, unspecified: Secondary | ICD-10-CM | POA: Diagnosis not present

## 2022-09-22 DIAGNOSIS — M720 Palmar fascial fibromatosis [Dupuytren]: Secondary | ICD-10-CM

## 2022-09-22 DIAGNOSIS — I82401 Acute embolism and thrombosis of unspecified deep veins of right lower extremity: Secondary | ICD-10-CM

## 2022-09-22 DIAGNOSIS — N400 Enlarged prostate without lower urinary tract symptoms: Secondary | ICD-10-CM

## 2022-09-22 DIAGNOSIS — E291 Testicular hypofunction: Secondary | ICD-10-CM

## 2022-09-22 DIAGNOSIS — I25118 Atherosclerotic heart disease of native coronary artery with other forms of angina pectoris: Secondary | ICD-10-CM

## 2022-09-22 DIAGNOSIS — I10 Essential (primary) hypertension: Secondary | ICD-10-CM | POA: Diagnosis not present

## 2022-09-22 DIAGNOSIS — E1159 Type 2 diabetes mellitus with other circulatory complications: Secondary | ICD-10-CM

## 2022-09-22 MED ORDER — ROSUVASTATIN CALCIUM 20 MG PO TABS: 20.0000 mg | ORAL_TABLET | Freq: Every day | ORAL | 3 refills | Status: DC

## 2022-09-22 MED ORDER — TAMSULOSIN HCL 0.4 MG PO CAPS: 0.4000 mg | ORAL_CAPSULE | Freq: Every day | ORAL | 1 refills | Status: AC

## 2022-09-22 MED ORDER — NITROGLYCERIN 0.4 MG SL SUBL: SUBLINGUAL_TABLET | SUBLINGUAL | 0 refills | Status: DC

## 2022-09-22 MED ORDER — LISINOPRIL 5 MG PO TABS: 5.0000 mg | ORAL_TABLET | Freq: Every day | ORAL | 3 refills | Status: DC

## 2022-09-22 MED ORDER — ACEBUTOLOL HCL 200 MG PO CAPS: 200.0000 mg | ORAL_CAPSULE | Freq: Two times a day (BID) | ORAL | 0 refills | Status: DC

## 2022-09-22 MED ORDER — VERAPAMIL HCL ER 240 MG PO TBCR: 240.0000 mg | EXTENDED_RELEASE_TABLET | Freq: Every day | ORAL | 0 refills | Status: DC

## 2022-09-22 MED ORDER — SPIRONOLACTONE 50 MG PO TABS: 50.0000 mg | ORAL_TABLET | ORAL | 1 refills | Status: DC

## 2022-09-22 NOTE — Patient Instructions (Signed)
It was a pleasure to see you today.  Thank you for giving [place] the opportunity to be involved in your care.  Below is a brief recap of your visit and next steps.  We will plan to see you again in 6 weeks.   Summary Increase Ozempic to 0.5 mg weekly after your next injection I have given you printed prescription for your other medications.  We will follow up in 3 months Please come in for urine check next week

## 2022-09-22 NOTE — Progress Notes (Signed)
Established Patient Office Visit  Subjective   Patient ID: Omar [name], male    DOB: [birth year]  Age: 76 y.o. MRN: [id number]  Chief Complaint  Patient presents with   Hypertension    Follow up   Omar [name] returns to care today.  He was last seen by me on 9/21 for hypertension follow-up.  No medication changes were made at that time.  In the interim he has been seen by urology for testosterone injections.  He reports that he is also following up with a primary care provider at the [place] and has been started on Ozempic.  Omar [name] reports feeling well today.  He is asymptomatic and has no acute concerns to discuss.  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 Merlene Laughter interpreted the study-CPAP recommended, but refused by patient   Stroke [facility])    no deficits from stroke, "Didnt know I had one when they say I did".   Past Surgical History:  Procedure Laterality Date   BIOPSY  04/16/2019   Procedure: BIOPSY;  Surgeon: Danie Binder, MD;  [facility];  Service: Endoscopy;;   CATARACT EXTRACTION W/PHACO Left 04/03/2016   Procedure: CATARACT EXTRACTION PHACO AND INTRAOCULAR LENS PLACEMENT LEFT EYE CDE=21.76;  Surgeon: Williams Che, MD;  Location:  [facility];  Service: Ophthalmology;  Laterality: Left;   CATARACT EXTRACTION W/PHACO Right 06/19/2016   Procedure: CATARACT EXTRACTION PHACO AND INTRAOCULAR LENS PLACEMENT; CDE:  11.56;  Surgeon: Williams Che, MD;  Location: [facility];  Service: Ophthalmology;  Laterality: Right;   COLONOSCOPY N/A 04/16/2019   Procedure: COLONOSCOPY;  Surgeon: Danie Binder, MD;  [facility];  Service: Endoscopy;  Laterality: N/A;  8:30am   COLONOSCOPY W/ POLYPECTOMY  05/2008   polypectomy x4-adenomatous; internal hemorrhoids   CORONARY ANGIOPLASTY WITH STENT PLACEMENT  06/04/2012     DES    to [place]     x 1   DUPUYTREN / PALMAR FASCIOTOMY  2009   rt hand-dsc-went home same day x2   ESOPHAGOGASTRODUODENOSCOPY N/A 04/16/2019   Procedure: ESOPHAGOGASTRODUODENOSCOPY (EGD);  Surgeon: Danie Binder, MD;  [facility];  Service: Endoscopy;  Laterality: N/A;   LEFT HEART CATHETERIZATION WITH CORONARY ANGIOGRAM N/A 05/28/2012   Procedure: LEFT HEART CATHETERIZATION WITH CORONARY ANGIOGRAM;  Surgeon: Laverda Page, MD;  Location: [facility] CATH LAB;  Service: Cardiovascular;  Laterality: N/A;   PERCUTANEOUS CORONARY STENT INTERVENTION (PCI-S) N/A 06/04/2012   Procedure: PERCUTANEOUS CORONARY STENT INTERVENTION (PCI-S);  Surgeon: Laverda Page, MD;  Location: [facility] CATH LAB;  Service: Cardiovascular;  Laterality:  N/A;   POLYPECTOMY  04/16/2019   Procedure: POLYPECTOMY;  Surgeon: Danie Binder, MD;  [facility];  Service: Endoscopy;;   TONSILLECTOMY     TOTAL HIP ARTHROPLASTY Right 06/24/2019   Procedure: TOTAL HIP ARTHROPLASTY;  Surgeon: Marchia Bond, MD;  [facility];  Service: Orthopedics;  Laterality: Right;   VENTRAL HERNIA REPAIR  2010   umb hernia-[facility]-went home same day   Social History   Tobacco Use   Smoking status: Never   Smokeless tobacco: Never  Vaping Use   Vaping Use: Never used  Substance Use Topics   Alcohol use: No   Drug use: No    Family History  Problem Relation Age of Onset   Cancer Mother    Brain cancer Mother    Cancer Father    Prostate cancer Father    Breast cancer Daughter    Thyroid cancer Daughter    Coronary artery disease Neg Hx    Colon cancer Neg Hx    No Known Allergies  Review of Systems  Constitutional:  Negative for chills and fever.  HENT:  Negative for sore throat.   Respiratory:  Negative for cough and shortness of breath.   Cardiovascular:  Negative for chest pain, palpitations and leg swelling.  Gastrointestinal:  Negative for abdominal pain, blood in stool, constipation, diarrhea, nausea and vomiting.  Genitourinary:  Negative for dysuria and hematuria.  Musculoskeletal:  Negative for myalgias.  Skin:  Negative for itching and rash.  Neurological:  Negative for dizziness and headaches.  Psychiatric/Behavioral:  Negative for depression and suicidal ideas.      Objective:     BP 126/78   Pulse 73   Ht _0  (1.727 m)   Wt 260 lb (117.9 kg)   SpO2 91%   BMI 39.53 kg/m  BP Readings from Last 3 Encounters:  09/22/22 126/78  06/22/22 130/72  05/30/22 135/87   Physical Exam Vitals reviewed.  Constitutional:      General: He is not in acute distress.    Appearance: Normal appearance. He is obese. He is not ill-appearing.  HENT:     Head: Normocephalic and atraumatic.     Right Ear: External ear normal.     Left Ear: External ear normal.     Nose: Nose normal. No congestion or rhinorrhea.     Mouth/Throat:     Mouth: Mucous membranes are moist.     Pharynx: Oropharynx is clear.  Eyes:     Extraocular Movements: Extraocular movements intact.     Conjunctiva/sclera: Conjunctivae normal.     Pupils: Pupils are equal, round, and reactive to light.  Cardiovascular:     Rate and Rhythm: Normal rate and regular rhythm.     Pulses: Normal pulses.     Heart sounds: Murmur heard.  Pulmonary:     Effort: Pulmonary effort is normal.     Breath sounds: Normal breath  sounds. No wheezing, rhonchi or rales.  Abdominal:     General: Abdomen is flat. Bowel sounds are normal. There is no distension.     Palpations: Abdomen is soft.     Tenderness: There is no abdominal tenderness.  Musculoskeletal:        General: Deformity (Dupuytren's contracture right hand) present. No swelling. Normal range of motion.     Cervical back: Normal range of motion.  Skin:    General: Skin is warm and dry.     Capillary Refill: Capillary refill takes less than 2 seconds.  Neurological:     General: No focal deficit present.     Mental Status: He is alert and oriented to person, place, and time.     Motor: No weakness.  Psychiatric:        Mood and Affect: Mood normal.        Behavior: Behavior normal.        Thought Content: Thought content normal.    Last CBC Lab Results  Component Value Date   WBC 7.5 05/21/2022   HGB 13.5 05/21/2022   HCT 40.7 05/21/2022   MCV 90.2 05/21/2022   MCH 29.9 05/21/2022   RDW 14.2 05/21/2022   PLT 195 09/73/5329   Last metabolic panel Lab Results  Component Value Date   GLUCOSE 162 (H) 06/22/2022   NA 142 06/22/2022   K 4.3 06/22/2022   CL 104 06/22/2022   CO2 19 (L) 06/22/2022   BUN 9 06/22/2022   CREATININE 1.03 06/22/2022   EGFR 74 06/22/2022   CALCIUM 9.1 06/22/2022   PHOS 2.9 08/07/2017   PROT 6.9 05/19/2022   ALBUMIN 3.6 05/19/2022   LABGLOB 2.0 11/02/2021   AGRATIO 2.1 11/02/2021   BILITOT 0.6 05/19/2022   ALKPHOS 48 05/19/2022   AST 20 05/19/2022   ALT 15 05/19/2022   ANIONGAP 8 05/21/2022   Last lipids Lab Results  Component Value Date   CHOL 151 01/27/2022   HDL 53 01/27/2022   LDLCALC 70 01/27/2022   TRIG 164 (H) 01/27/2022   CHOLHDL 2.8 01/27/2022   Last hemoglobin A1c Lab Results  Component Value Date   HGBA1C 7.5 (H) 05/19/2022   Last thyroid functions Lab Results  Component Value Date   TSH 1.630 01/27/2022   T4TOTAL 8.1 10/08/2019   Last vitamin D Lab Results  Component Value Date    VD25OH 90.2 01/27/2022   Last vitamin B12 and Folate Lab Results  Component Value Date   VITAMINB12 231 03/11/2018     Assessment & Plan:   Problem List Items Addressed This Visit       Essential hypertension    He is currently prescribed acebutolol 200 mg twice daily, lisinopril 5 mg daily, spironolactone 50 mg daily, and verapamil 240 mg daily.  His blood pressure today is 126/78.  No medication changes indicated.      Type 2 diabetes mellitus with vascular disease ([place]) - Primary    Last A1c 7.5 in August is currently prescribed metformin 500 mg twice daily and reports that he was recently started on Ozempic 0.25 mg weekly injections by a primary care provider at the [place].  He will complete his fourth injection next week. -Urine microalbumin/creatinine ratio ordered today -I recommended that he increase Ozempic 0.5 mg weekly after his next injection, however he was also instructed to discuss this with the provider who is prescribing Ozempic at the [place]. -Provided with Rx for new diabetic shoes at his request      CAD (coronary artery disease), native coronary artery    Denies recent chest pain.  Has sublingual nitroglycerin tablets in his pocket but states that he has never needed to use them. He is scheduled for follow-up with cardiology in January.      DVT of leg (deep venous thrombosis) (HCC)    History of DVT x 2.  He is chronically anticoagulated with Eliquis.      Dupuytren's contracture of right hand    Reports that he is seeing a hand specialist      Testosterone deficiency  in male    Followed by urology.  Currently receives biweekly testosterone injections.      Return in about 3 months (around 12/22/2022).    Johnette Abraham, MD

## 2022-09-29 DIAGNOSIS — M720 Palmar fascial fibromatosis [Dupuytren]: Secondary | ICD-10-CM | POA: Insufficient documentation

## 2022-09-29 NOTE — Assessment & Plan Note (Signed)
He is currently prescribed acebutolol 200 mg twice daily, lisinopril 5 mg daily, spironolactone 50 mg daily, and verapamil 240 mg daily.  His blood pressure today is 126/78.  No medication changes indicated.

## 2022-09-29 NOTE — Assessment & Plan Note (Addendum)
Denies recent chest pain.  Has sublingual nitroglycerin tablets in his pocket but states that he has never needed to use them. He is scheduled for follow-up with cardiology in January.

## 2022-09-29 NOTE — Assessment & Plan Note (Signed)
Followed by urology.  Currently receives biweekly testosterone injections.

## 2022-09-29 NOTE — Assessment & Plan Note (Signed)
Reports that he is seeing a hand specialist

## 2022-09-29 NOTE — Assessment & Plan Note (Signed)
History of DVT x 2.  He is chronically anticoagulated with Eliquis.

## 2022-09-29 NOTE — Assessment & Plan Note (Signed)
Last A1c 7.5 in August is currently prescribed metformin 500 mg twice daily and reports that he was recently started on Ozempic 0.25 mg weekly injections by a primary care provider at the [place].  He will complete his fourth injection next week. -Urine microalbumin/creatinine ratio ordered today -I recommended that he increase Ozempic 0.5 mg weekly after his next injection, however he was also instructed to discuss this with the provider who is prescribing Ozempic at the [place].

## 2022-10-04 ENCOUNTER — Ambulatory Visit (INDEPENDENT_AMBULATORY_CARE_PROVIDER_SITE_OTHER): Payer: PPO | Admitting: Urology

## 2022-10-04 DIAGNOSIS — E291 Testicular hypofunction: Secondary | ICD-10-CM | POA: Diagnosis not present

## 2022-10-04 MED ORDER — TESTOSTERONE CYPIONATE 200 MG/ML IM SOLN
200.0000 mg | Freq: Once | INTRAMUSCULAR | Status: AC
  Administered 2022-10-04: 200 mg via INTRAMUSCULAR

## 2022-10-04 NOTE — Progress Notes (Signed)
Testosterone IM Injection  Due to Hypogonadism patient is present today for a Testosterone Injection.  Medication: Testosterone Cypionate Dose: 200 Location: left upper outer buttocks Lot: [id number] Exp:11/2023  Patient tolerated well, no complications were noted  Performed by: Levi Aland, CMA  Follow up: Follow up as scheduled.

## 2022-10-06 ENCOUNTER — Ambulatory Visit: Payer: PPO | Admitting: Cardiology

## 2022-10-06 DIAGNOSIS — E1159 Type 2 diabetes mellitus with other circulatory complications: Secondary | ICD-10-CM | POA: Diagnosis not present

## 2022-10-09 LAB — MICROALBUMIN / CREATININE URINE RATIO
Creatinine, Urine: 118.9 mg/dL
Microalb/Creat Ratio: <3 mg/g creat (ref 0–29)
Microalbumin, Urine: <3 ug/mL

## 2022-10-13 ENCOUNTER — Encounter: Payer: PPO | Admitting: Nurse Practitioner

## 2022-10-13 ENCOUNTER — Encounter: Payer: PPO | Admitting: Internal Medicine

## 2022-10-13 NOTE — Progress Notes (Deleted)
Complete physical exam  Patient: Omar [name]   DOB: 09-17-1946   77 y.o. Male  MRN: [id number]  Subjective:    No chief complaint on file.   Omar [name] is a 77 y.o. male who presents today for a complete physical exam. He reports consuming a {diet types:17450} diet. {types:19826} He generally feels {DESC; WELL/FAIRLY WELL/POORLY:18703}. He reports sleeping {DESC; WELL/FAIRLY WELL/POORLY:18703}. He {does/does not:200015} have additional problems to discuss today.    Most recent fall risk assessment:    09/22/2022    4:11 PM  Sellers in the past year? 0  Number falls in past yr: 0  Injury with Fall? 0  Risk for fall due to : No Fall Risks  Follow up Falls evaluation completed     Most recent depression screenings:    09/22/2022    4:11 PM 06/22/2022    8:45 AM  PHQ 2/9 Scores  PHQ - 2 Score 0 0  PHQ- 9 Score 0     {VISON DENTAL STD PSA (Optional):27386}  {History (Optional):23778}  Patient Care Team: Johnette Abraham, MD as PCP - General (Internal Medicine) Danie Binder, MD (Inactive) as Consulting Physician (Gastroenterology)   Outpatient Medications Prior to Visit  Medication Sig   acebutolol (SECTRAL) 200 MG capsule Take 1 capsule (200 mg total) by mouth 2 (two) times daily.   apixaban (ELIQUIS) 5 MG TABS tablet Take 5 mg by mouth 2 (two) times daily.   lisinopril (ZESTRIL) 5 MG tablet Take 1 tablet (5 mg total) by mouth daily.   metFORMIN (GLUCOPHAGE) 500 MG tablet Take 1 tablet (500 mg total) by mouth 2 (two) times daily with a meal.   Multiple Vitamins-Minerals (ONE-A-DAY 50 PLUS PO) Take 1 tablet by mouth at bedtime.    nitroGLYCERIN (NITROSTAT) 0.4 MG SL tablet PLACE 1 TAB UNDER TONGUE EVERY 5 MIN IF NEEDED FOR CHEST PAIN. MAY USE 3 TIMES.NO RELIEF CALL 911.   rosuvastatin (CRESTOR) 20 MG tablet Take 1 tablet (20 mg total) by mouth daily.   Semaglutide,0.25 or 0.5MG/DOS, 2 MG/3ML SOPN Inject 0.25 mg into the skin once a week.    spironolactone (ALDACTONE) 50 MG tablet Take 1 tablet (50 mg total) by mouth every morning.   tamsulosin (FLOMAX) 0.4 MG CAPS capsule Take 1 capsule (0.4 mg total) by mouth daily.   testosterone cypionate (DEPOTESTOSTERONE CYPIONATE) 200 MG/ML injection Inject 0.5 mLs (100 mg total) into the muscle every 7 (seven) days. (Patient taking differently: Inject 100 mg into the muscle every 14 (fourteen) days.)   torsemide (DEMADEX) 20 MG tablet Take 20 mg by mouth daily as needed (swelling).   verapamil (CALAN-SR) 240 MG CR tablet Take 1 tablet (240 mg total) by mouth at bedtime.   No facility-administered medications prior to visit.    ROS        Objective:     There were no vitals taken for this visit. {Vitals History (Optional):23777}  Physical Exam   No results found for any visits on 10/13/22. {Show previous labs (optional):23779}    Assessment & Plan:    Routine Health Maintenance and Physical Exam  Immunization History  Administered Date(s) Administered   Fluad Quad(high Dose 65+) 06/29/2020, 06/22/2022   Influenza,inj,Quad PF,6+ Mos 07/17/2017, 08/03/2019   Influenza-Unspecified 09/15/2021   PFIZER(Purple Top)SARS-COV-2 Vaccination 10/23/2019, 11/14/2019, 08/12/2020   Pneumococcal Conjugate-13 10/08/2019    Health Maintenance  Topic Date Due   DTaP/Tdap/Td (1 - Tdap) Never done   OPHTHALMOLOGY EXAM  09/03/2021   HEMOGLOBIN A1C  11/19/2022   Medicare Annual Wellness (AWV)  01/17/2023   FOOT EXAM  01/28/2023   Diabetic kidney evaluation - eGFR measurement  06/23/2023   Diabetic kidney evaluation - Urine ACR  10/07/2023   Pneumonia Vaccine 31+ Years old  Completed   INFLUENZA VACCINE  Completed   Hepatitis C Screening  Completed   Zoster Vaccines- Shingrix  Completed   HPV VACCINES  Aged Out   COLONOSCOPY (Pts 45-24yr Insurance coverage will need to be confirmed)  Discontinued   COVID-19 Vaccine  Discontinued    Discussed health benefits of physical activity,  and encouraged him to engage in regular exercise appropriate for his age and condition.  Problem List Items Addressed This Visit   None  No follow-ups on file.     PJohnette Abraham MD

## 2022-10-17 ENCOUNTER — Encounter: Payer: Self-pay | Admitting: Urology

## 2022-10-17 ENCOUNTER — Ambulatory Visit (INDEPENDENT_AMBULATORY_CARE_PROVIDER_SITE_OTHER): Payer: PPO | Admitting: Urology

## 2022-10-17 VITALS — BP 126/83 | HR 74

## 2022-10-17 DIAGNOSIS — E291 Testicular hypofunction: Secondary | ICD-10-CM | POA: Diagnosis not present

## 2022-10-17 DIAGNOSIS — N401 Enlarged prostate with lower urinary tract symptoms: Secondary | ICD-10-CM

## 2022-10-17 DIAGNOSIS — N5201 Erectile dysfunction due to arterial insufficiency: Secondary | ICD-10-CM

## 2022-10-17 MED ORDER — TESTOSTERONE CYPIONATE 200 MG/ML IM SOLN
200.0000 mg | Freq: Once | INTRAMUSCULAR | Status: AC
  Administered 2022-10-17: 200 mg via INTRAMUSCULAR

## 2022-10-17 NOTE — Addendum Note (Signed)
Addended by: Darcella Gasman R on: 10/17/2022 04:35 PM   Modules accepted: Orders

## 2022-10-17 NOTE — Progress Notes (Signed)
History of Present Illness: Omar [name] returns for f/u of low testosterone. He receives 200 mg IM q 2 weeks here in our office.  Most recent labs in July/Aug 2023-- Hgb/Hct  13.5/40.7 Total T   835 (2 days following injection of 100 mg) PSA   1.0  No problems with injections.  No problems with urination. On tamsulosin.  IPSS 9, quality-of-life 1.  Past Medical History:  Diagnosis Date   Abnormal EKG    Inferior Q waves   BPH (benign prostatic hyperplasia)    Chronic anticoagulation 01/16/2017   Coronary artery disease    Degenerative joint disease    Diabetes mellitus, type 2 (HCC)    No insulin   DVT (deep venous thrombosis) ([place]) 4 -5 yrs ago   DVT of leg (deep venous thrombosis) ([place]) 07/02/2015   Heart murmur    Hepatic steatosis    Heterozygous factor V Leiden mutation (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 Merlene Laughter interpreted the study-CPAP recommended, but refused by patient   Stroke [facility])    no deficits from stroke, "Didnt know I had one when they say I did".    Past Surgical History:  Procedure Laterality Date   BIOPSY  04/16/2019   Procedure: BIOPSY;  Surgeon: Danie Binder, MD;  [facility];  Service: Endoscopy;;   CATARACT EXTRACTION W/PHACO Left 04/03/2016   Procedure: CATARACT EXTRACTION PHACO AND INTRAOCULAR LENS PLACEMENT LEFT EYE CDE=21.76;  Surgeon: Williams Che, MD;  Location: [facility] ORS;  Service: Ophthalmology;  Laterality: Left;   CATARACT EXTRACTION W/PHACO Right 06/19/2016   Procedure: CATARACT EXTRACTION PHACO AND INTRAOCULAR LENS PLACEMENT; CDE:  11.56;  Surgeon: Williams Che, MD;  Location: [facility] ORS;  Service: Ophthalmology;  Laterality: Right;    COLONOSCOPY N/A 04/16/2019   Procedure: COLONOSCOPY;  Surgeon: Danie Binder, MD;  [facility];  Service: Endoscopy;  Laterality: N/A;  8:30am   COLONOSCOPY W/ POLYPECTOMY  05/2008   polypectomy x4-adenomatous; internal hemorrhoids   CORONARY ANGIOPLASTY WITH STENT PLACEMENT  06/04/2012     DES    to [place]     x 1   DUPUYTREN / PALMAR FASCIOTOMY  2009   rt hand-dsc-went home same day x2   ESOPHAGOGASTRODUODENOSCOPY N/A 04/16/2019   Procedure: ESOPHAGOGASTRODUODENOSCOPY (EGD);  Surgeon: Danie Binder, MD;  [facility];  Service: Endoscopy;  Laterality: N/A;   LEFT HEART CATHETERIZATION WITH CORONARY ANGIOGRAM N/A 05/28/2012   Procedure: LEFT HEART CATHETERIZATION WITH CORONARY ANGIOGRAM;  Surgeon: Laverda Page, MD;  Location: [facility] CATH LAB;  Service: Cardiovascular;  Laterality: N/A;   PERCUTANEOUS CORONARY STENT INTERVENTION (PCI-S) N/A 06/04/2012   Procedure: PERCUTANEOUS CORONARY STENT INTERVENTION (PCI-S);  Surgeon: Laverda Page, MD;  Location: [facility] CATH LAB;  Service: Cardiovascular;  Laterality: N/A;   POLYPECTOMY  04/16/2019   Procedure: POLYPECTOMY;  Surgeon: Danie Binder, MD;  [facility];  Service: Endoscopy;;   TONSILLECTOMY     TOTAL HIP ARTHROPLASTY Right 06/24/2019   Procedure: TOTAL HIP ARTHROPLASTY;  Surgeon: Marchia Bond, MD;  Location: [facility] ORS;  Service: Orthopedics;  Laterality: Right;  VENTRAL HERNIA REPAIR  2010   umb hernia-[facility]-went home same day    Home Medications:  Allergies as of 10/17/2022   No Known Allergies      Medication List        Accurate as of October 17, 2022  6:26 AM. If you have any questions, ask your nurse or doctor.          acebutolol 200 MG capsule Commonly known as: SECTRAL Take 1 capsule (200 mg total) by mouth 2 (two) times daily.   apixaban 5 MG Tabs tablet Commonly known as: ELIQUIS Take 5 mg by mouth 2 (two) times daily.   lisinopril 5 MG  tablet Commonly known as: ZESTRIL Take 1 tablet (5 mg total) by mouth daily.   metFORMIN 500 MG tablet Commonly known as: GLUCOPHAGE Take 1 tablet (500 mg total) by mouth 2 (two) times daily with a meal.   nitroGLYCERIN 0.4 MG SL tablet Commonly known as: NITROSTAT PLACE 1 TAB UNDER TONGUE EVERY 5 MIN IF NEEDED FOR CHEST PAIN. MAY USE 3 TIMES.NO RELIEF CALL 911.   ONE-A-DAY 50 PLUS PO Take 1 tablet by mouth at bedtime.   rosuvastatin 20 MG tablet Commonly known as: CRESTOR Take 1 tablet (20 mg total) by mouth daily.   Semaglutide(0.25 or 0.'5MG'$ /DOS) 2 MG/3ML Sopn Inject 0.25 mg into the skin once a week.   spironolactone 50 MG tablet Commonly known as: Aldactone Take 1 tablet (50 mg total) by mouth every morning.   tamsulosin 0.4 MG Caps capsule Commonly known as: FLOMAX Take 1 capsule (0.4 mg total) by mouth daily.   testosterone cypionate 200 MG/ML injection Commonly known as: DEPOTESTOSTERONE CYPIONATE Inject 0.5 mLs (100 mg total) into the muscle every 7 (seven) days. What changed: when to take this   torsemide 20 MG tablet Commonly known as: DEMADEX Take 20 mg by mouth daily as needed (swelling).   verapamil 240 MG CR tablet Commonly known as: CALAN-SR Take 1 tablet (240 mg total) by mouth at bedtime.        Allergies: No Known Allergies  Family History  Problem Relation Age of Onset   Cancer Mother    Brain cancer Mother    Cancer Father    Prostate cancer Father    Breast cancer Daughter    Thyroid cancer Daughter    Coronary artery disease Neg Hx    Colon cancer Neg Hx     Social History:  reports that he has never smoked. He has never used smokeless tobacco. He reports that he does not drink alcohol and does not use drugs.  ROS: A complete review of systems was performed.  All systems are negative except for pertinent findings as noted.  Physical Exam:  Vital signs in last 24 hours: There were no vitals taken for this visit. Constitutional:   Alert and oriented, No acute distress Cardiovascular: Regular rate  Respiratory: Normal respiratory effort Psychiatric: Normal mood and affect  I have reviewed prior pt notes  I have reviewed urinalysis results  I have reviewed prior PSA, testosterone and Hgb results    Impression/Assessment:  1.  BPH, on tamsulosin, doing well with minimal urinary symptoms  2.  Low testosterone, on repletion with testosterone cypionate, should be on 200 mg IM every couple of weeks  Plan:  1.  Continue every 2 week testosterone injections  2.  I will see back in 1 year for recheck

## 2022-10-18 ENCOUNTER — Ambulatory Visit: Payer: PPO

## 2022-10-23 ENCOUNTER — Encounter: Payer: Self-pay | Admitting: Cardiology

## 2022-10-23 ENCOUNTER — Ambulatory Visit: Payer: PPO | Admitting: Cardiology

## 2022-10-23 VITALS — BP 130/76 | HR 73 | Resp 16 | Ht 68.0 in | Wt 264.0 lb

## 2022-10-23 DIAGNOSIS — E7800 Pure hypercholesterolemia, unspecified: Secondary | ICD-10-CM | POA: Diagnosis not present

## 2022-10-23 DIAGNOSIS — I25118 Atherosclerotic heart disease of native coronary artery with other forms of angina pectoris: Secondary | ICD-10-CM | POA: Diagnosis not present

## 2022-10-23 DIAGNOSIS — D6859 Other primary thrombophilia: Secondary | ICD-10-CM

## 2022-10-23 DIAGNOSIS — I10 Essential (primary) hypertension: Secondary | ICD-10-CM

## 2022-10-23 NOTE — Progress Notes (Signed)
Primary Physician/Referring:  Johnette Abraham, MD  Patient ID: Omar [name], male    DOB: 09-03-1946, 77 y.o.   MRN: [id number]  Chief Complaint  Patient presents with   Hypertension   Coronary Artery Disease   Follow-up    1 year   HPI:    JENTZEN MINASYAN  is a 77 y.o.  Caucasian male with coronary artery disease, ramus intermediate branch with  Promus DES  on 06/04/2012, DM, hypertension, mixed hyperlipidemia, uncontrolled diabetes mellitus, PAD and current spontaneous DVT x 2 1 after long travel and the second 1 spontaneous and is on chronic anticoagulation with Eliquis.    He remains asymptomatic except for mild chronic dyspnea Past Medical History:  Diagnosis Date   Abnormal EKG    Inferior Q waves   BPH (benign prostatic hyperplasia)    Chronic anticoagulation 01/16/2017   Coronary artery disease    Degenerative joint disease    Diabetes mellitus, type 2 (HCC)    No insulin   DVT (deep venous thrombosis) ([place]) 4 -5 yrs ago   DVT of leg (deep venous thrombosis) ([place]) 07/02/2015   Heart murmur    Hepatic steatosis    Heterozygous factor V Leiden mutation ([place]) 01/16/2017   Hyperlipidemia    Hypertension    Echo in 2008-technically limited, mild LVH, normal EF; 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 Merlene Laughter interpreted the study-CPAP recommended, but refused by patient   Stroke [facility])    no deficits from stroke, "Didnt know I had one when they say I did".   Past Surgical History:  Procedure Laterality Date   BIOPSY  04/16/2019   Procedure: BIOPSY;  Surgeon: Danie Binder, MD;  [facility];  Service: Endoscopy;;   CATARACT EXTRACTION W/PHACO Left 04/03/2016   Procedure: CATARACT EXTRACTION PHACO AND INTRAOCULAR LENS PLACEMENT LEFT EYE CDE=21.76;  Surgeon: Williams Che, MD;  Location: [facility];  Service: Ophthalmology;  Laterality: Left;   CATARACT EXTRACTION W/PHACO Right 06/19/2016   Procedure: CATARACT EXTRACTION PHACO AND INTRAOCULAR LENS PLACEMENT; CDE:  11.56;  Surgeon: Williams Che, MD;  Location: [facility];  Service: Ophthalmology;  Laterality: Right;   COLONOSCOPY N/A 04/16/2019   Procedure: COLONOSCOPY;  Surgeon: Danie Binder, MD;  [facility];  Service: Endoscopy;  Laterality: N/A;  8:30am   COLONOSCOPY W/ POLYPECTOMY  05/2008   polypectomy x4-adenomatous; internal hemorrhoids   CORONARY ANGIOPLASTY WITH STENT PLACEMENT  06/04/2012     DES    to [place]     x 1   DUPUYTREN / PALMAR FASCIOTOMY  2009   rt hand-dsc-went home same day x2   ESOPHAGOGASTRODUODENOSCOPY N/A 04/16/2019   Procedure: ESOPHAGOGASTRODUODENOSCOPY (EGD);  Surgeon: Danie Binder, MD;  [facility];  Service: Endoscopy;  Laterality: N/A;   LEFT HEART CATHETERIZATION WITH CORONARY ANGIOGRAM N/A 05/28/2012   Procedure: LEFT HEART CATHETERIZATION WITH CORONARY ANGIOGRAM;  Surgeon: Laverda Page, MD;  Location: [facility] CATH LAB;  Service: Cardiovascular;  Laterality: N/A;   PERCUTANEOUS CORONARY STENT INTERVENTION (PCI-S) N/A 06/04/2012   Procedure: PERCUTANEOUS CORONARY STENT INTERVENTION (PCI-S);  Surgeon: Laverda Page, MD;  Location: [facility] CATH LAB;  Service: Cardiovascular;  Laterality: N/A;   POLYPECTOMY  04/16/2019   Procedure: POLYPECTOMY;  Surgeon: Danie Binder, MD;  [facility];  Service: Endoscopy;;   TONSILLECTOMY     TOTAL HIP ARTHROPLASTY Right 06/24/2019   Procedure: TOTAL HIP ARTHROPLASTY;  Surgeon: Marchia Bond, MD;  [facility];  Service: Orthopedics;  Laterality: Right;   VENTRAL HERNIA REPAIR  2010   umb hernia-[facility]-went home same day    Social History   Tobacco Use   Smoking status: Never   Smokeless tobacco: Never  Substance Use Topics   Alcohol use: No   Marital Status: Married   ROS   Review of Systems  HENT:  Positive for stridor.   Cardiovascular:  Positive for dyspnea on exertion (stable). Negative for chest pain and leg swelling.  Gastrointestinal:  Negative for melena.   Objective      10/23/2022    3:18 PM 10/17/2022    3:50 PM 09/22/2022    4:09 PM  Vitals with BMI  Height '5\' 8"'$   '5\' 8"'$   Weight 264 lbs  260 lbs  BMI 47.42  59.56  Systolic 387 564 332  Diastolic 76 83 78  Pulse 73 74 73    Physical Exam Constitutional:      Appearance: He is morbidly obese.  Neck:     Vascular: No carotid bruit or JVD.     Comments: Short neck and difficult to evaluate JVP Cardiovascular:     Rate and Rhythm: Normal rate and regular rhythm.     Pulses:          Dorsalis pedis pulses are 0 on the right side and 0 on the left side.       Posterior tibial pulses are 0 on the right side and 2+ on the left side.     Heart sounds: Normal heart sounds. No murmur heard.    No gallop.  Pulmonary:     [place]: Pulmonary effort is normal.     Breath sounds: Normal breath sounds.  Abdominal:     General: Bowel sounds are normal.     Palpations: Abdomen is soft.     Comments: Obese. Pannus present  Musculoskeletal:     Right lower leg: Edema (1+ pitting) present.     Left lower leg: Edema (1+ pitting) present.  Skin:    Capillary Refill: Capillary refill takes less than 2 seconds.    Laboratory examination:    Recent Labs    05/19/22 0625 05/20/22 0459 05/21/22 0344 06/22/22 0922  NA 130* 135 138 142  K 4.8 4.3 3.9 4.3  CL 98 104 107 104  CO2 19* 24 23 19*  GLUCOSE 222* 122* 140* 162*  BUN 35* 25* 20 9  CREATININE 2.24* 1.53* 1.22 1.03  CALCIUM 8.9 8.4* 8.5* 9.1  GFRNONAA 29* 46* >60  --       Latest Ref Rng & Units 06/22/2022    9:22 AM 05/21/2022    3:44 AM 05/20/2022    4:59 AM  CMP  Glucose 70 - 99 mg/dL 162  140  122   BUN 8 - 27 mg/dL '9  20  25   '$ Creatinine 0.76 - 1.27 mg/dL 1.03  1.22  1.53   Sodium 134 - 144 mmol/L 142  138  135   Potassium  3.5 - 5.2 mmol/L 4.3  3.9  4.3   Chloride 96 - 106 mmol/L 104  107  104   CO2 20 - 29 mmol/L '19  23  24   '$ Calcium 8.6 -  10.2 mg/dL 9.1  8.5  8.4       Latest Ref Rng & Units 05/21/2022    3:44 AM 05/20/2022    4:59 AM 05/19/2022    6:25 AM  CBC  WBC 4.0 - 10.5 K/uL 7.5  8.3  11.6   Hemoglobin 13.0 - 17.0 g/dL 13.5  13.5  15.7   Hematocrit 39.0 - 52.0 % 40.7  41.0  46.9   Platelets 150 - 400 K/uL 195  165  218    Lipid Panel Recent Labs    11/02/21 0935 01/27/22 1146  CHOL 202* 151  TRIG 159* 164*  LDLCALC 126* 70  HDL 48 53  CHOLHDL 4.2 2.8     HEMOGLOBIN A1C Lab Results  Component Value Date   HGBA1C 7.5 (H) 05/19/2022   MPG 168.55 05/19/2022   TSH Recent Labs    01/27/22 1146  TSH 1.630   External labs   08/18/2022:  Hb 15.5/HCT 46.1, platelets 271.  Normal indicis.  BUN 16, creatinine 1.10, EGFR 63 mL, potassium 4.9, LFTs normal.  A1c 8.6%.  Total cholesterol 103, triglycerides 97, HDL 50, LDL 34. Medications and allergies   Allergies  Allergen Reactions   Empagliflozin       Current Outpatient Medications:    acebutolol (SECTRAL) 200 MG capsule, Take 1 capsule (200 mg total) by mouth 2 (two) times daily., Disp: 180 capsule, Rfl: 0   apixaban (ELIQUIS) 5 MG TABS tablet, Take 5 mg by mouth 2 (two) times daily., Disp: , Rfl:    lisinopril (ZESTRIL) 5 MG tablet, Take 1 tablet (5 mg total) by mouth daily., Disp: 90 tablet, Rfl: 3   metFORMIN (GLUCOPHAGE) 500 MG tablet, Take 1 tablet (500 mg total) by mouth 2 (two) times daily with a meal., Disp: 180 tablet, Rfl: 3   Multiple Vitamins-Minerals (ONE-A-DAY 50 PLUS PO), Take 1 tablet by mouth at bedtime. , Disp: , Rfl:    nitroGLYCERIN (NITROSTAT) 0.4 MG SL tablet, PLACE 1 TAB UNDER TONGUE EVERY 5 MIN IF NEEDED FOR CHEST PAIN. MAY USE 3 TIMES.NO RELIEF CALL 911., Disp: 25 tablet, Rfl: 0   rosuvastatin (CRESTOR) 20 MG tablet, Take 1 tablet (20 mg total) by mouth daily., Disp: 90 tablet, Rfl: 3    Semaglutide,0.25 or 0.'5MG'$ /DOS, 2 MG/3ML SOPN, Inject 0.25 mg into the skin once a week., Disp: , Rfl:    spironolactone (ALDACTONE) 50 MG tablet, Take 1 tablet (50 mg total) by mouth every morning., Disp: 90 tablet, Rfl: 1   tamsulosin (FLOMAX) 0.4 MG CAPS capsule, Take 1 capsule (0.4 mg total) by mouth daily., Disp: 90 capsule, Rfl: 1   testosterone cypionate (DEPOTESTOSTERONE CYPIONATE) 200 MG/ML injection, Inject 0.5 mLs (100 mg total) into the muscle every 7 (seven) days. (Patient taking differently: Inject 100 mg into the muscle every 14 (fourteen) days.), Disp: 10 mL, Rfl: 1   torsemide (DEMADEX) 20 MG tablet, Take 20 mg by mouth daily as needed (swelling)., Disp: , Rfl:    verapamil (CALAN-SR) 240 MG CR tablet, Take 1 tablet (240 mg total) by mouth at bedtime., Disp: 90 tablet, Rfl: 0    Radiology:  No results found. Cardiac Studies:   Heart Cath 06/04/12: Ostial Ramus intermediate 2.75x12 Promus element DES placed. Has anamolous circumflex coronary artery from right coronary cusp.  Outside echo 02/23/12: Normal LVEF, 55%. Cannot exclude wall motion abnormality. Mild aortic calcification. No aortic stenosis.   Carotid artery duplex 07/2016: Mild bilateral common carotid, bifurcation, proximal ICA stenosis with mild  diffuse plaque. Antegrade vertebral flow. No significant change from Carotid artery duplex 06/19/13  Lexiscan Myoview Stress Test 04/07/2019: Lexiscan stress test was performed. Stress EKG is non-diagnostic, as this is pharmacological stress test. There was baseline artifact as well making EKG difficult to interpret.  The perfusion imaging study demonstrates diaphragmatic attenuation artifact in the inferior wall without reversible ischemia or scar.  LV systolic function was normal without wall motion abnormality and calculated at 70%. This is a low risk study.   EKG:  EKG 10/23/2022: Sinus rhythm at rate of 68 bpm, left axis deviation, left anterior fascicular block.  Right  bundle branch block.  Low-voltage complexes.  Pulmonary disease pattern.  Nonspecific T abnormality.  Compared to 10/14/2021, no significant change.  Assessment     ICD-10-CM   1. Coronary artery disease of native artery of native heart with stable angina pectoris ([place])  I25.118 EKG 12-Lead    2. Essential hypertension  I10     3. Hypercholesteremia  E78.00     4. Hypercoagulable state ([place])  D68.59       No orders of the defined types were placed in this encounter.  There are no discontinued medications.    Recommendations:    CLERENCE GUBSER is a 77 y.o. Caucasian male with coronary artery disease, ramus intermediate branch with  Promus DES  on 06/04/2012, diabetes mellitus, hypertension, mixed hyperlipidemia, PAD and current spontaneous DVT x 2 1 after long travel and the second 1 spontaneous and is on chronic anticoagulation with Eliquis.    1. Coronary artery disease of native artery of native heart with stable angina pectoris [facility]) Patient is here for annual visit, fortunately he has not had any recurrence of angina pectoris, presently asymptomatic and has not used any sublingual nitroglycerin.  Dyspnea is remained stable related to his obesity hypoventilation.  2. Essential hypertension Blood pressure is very well-controlled on present medical regimen, he is on ACE inhibitor's and spironolactone and also acebutolol which she is tolerating.  EKG is unchanged from previous EKG.  Labs reveal normal EGFR and normal renal function.  3. Hypercholesteremia I reviewed the labs from the [place] [facility], lipids under excellent control.  He has been started on Ozempic, I have discussed with him regarding slow eating, taking a break halfway between the meal and to avoid excess calories.  4. Hypercoagulable state ([place]) Presently on Eliquis and is tolerating this.  He has not had any recurrence of DVT.  I will see him back in a year or sooner if problems.     Adrian Prows, MD,  [facility] 10/23/2022, 3:53 PM Office: [phone] Pager: [phone]

## 2022-11-01 ENCOUNTER — Ambulatory Visit (INDEPENDENT_AMBULATORY_CARE_PROVIDER_SITE_OTHER): Payer: PPO | Admitting: Urology

## 2022-11-01 DIAGNOSIS — E291 Testicular hypofunction: Secondary | ICD-10-CM

## 2022-11-01 MED ORDER — TESTOSTERONE CYPIONATE 200 MG/ML IM SOLN
200.0000 mg | Freq: Once | INTRAMUSCULAR | Status: AC
  Administered 2022-11-01: 200 mg via INTRAMUSCULAR

## 2022-11-01 NOTE — Progress Notes (Signed)
testosterone injection  Medication: testosterone Dose: 200 mg Location: right Patient tolerated well, no complications were noted  Performed by: Estill Bamberg, RN assisted [place], [place]

## 2022-11-01 NOTE — Progress Notes (Signed)
Testosterone IM Injection  Due to Hypogonadism patient is present today for a Testosterone Injection.  Medication: Testosterone Cypionate Dose: 200 Location: left upper outer buttocks Lot: [id number] Exp:05/225  Patient tolerated well, no complications were noted  Performed by: Levi Aland, CMA  Follow up: Follow up as scheduled.

## 2022-11-09 ENCOUNTER — Encounter: Payer: Self-pay | Admitting: Internal Medicine

## 2022-11-09 ENCOUNTER — Ambulatory Visit (INDEPENDENT_AMBULATORY_CARE_PROVIDER_SITE_OTHER): Payer: PPO | Admitting: Internal Medicine

## 2022-11-09 VITALS — BP 128/84 | HR 77 | Ht 68.0 in | Wt 266.2 lb

## 2022-11-09 DIAGNOSIS — E782 Mixed hyperlipidemia: Secondary | ICD-10-CM | POA: Diagnosis not present

## 2022-11-09 DIAGNOSIS — E559 Vitamin D deficiency, unspecified: Secondary | ICD-10-CM | POA: Diagnosis not present

## 2022-11-09 DIAGNOSIS — E1159 Type 2 diabetes mellitus with other circulatory complications: Secondary | ICD-10-CM

## 2022-11-09 DIAGNOSIS — I10 Essential (primary) hypertension: Secondary | ICD-10-CM | POA: Diagnosis not present

## 2022-11-09 DIAGNOSIS — K760 Fatty (change of) liver, not elsewhere classified: Secondary | ICD-10-CM | POA: Diagnosis not present

## 2022-11-09 DIAGNOSIS — Z0001 Encounter for general adult medical examination with abnormal findings: Secondary | ICD-10-CM | POA: Insufficient documentation

## 2022-11-09 NOTE — Progress Notes (Signed)
Complete physical exam  Patient: Omar [name]   DOB: [birth year]   77 y.o. Male  MRN: [id number]  Subjective:    Chief Complaint  Patient presents with   Annual Exam   Omar [name] is a 77 y.o. male who presents today for a complete physical exam. He reports consuming a general diet. The patient does not participate in regular exercise at present. He generally feels well. He reports sleeping well. He does not have additional problems to discuss today.   Most recent fall risk assessment:    11/09/2022    4:15 PM  [place] in the past year? 0  Number falls in past yr: 0  Injury with Fall? 0  Risk for fall due to : No Fall Risks  Follow up Falls evaluation completed     Most recent depression screenings:    11/09/2022    4:15 PM 09/22/2022    4:11 PM  PHQ 2/9 Scores  PHQ - 2 Score 0 0  PHQ- 9 Score 2 0    Vision:Within last year and Dental: No current dental problems and Receives regular dental care  Past Medical History:  Diagnosis Date   Abnormal EKG    Inferior Q waves   BPH (benign prostatic hyperplasia)    Chronic anticoagulation 01/16/2017   Coronary artery disease    Degenerative joint disease    Diabetes mellitus, type 2 (HCC)    No insulin   DVT (deep venous thrombosis) ([place]) 4 -5 yrs ago   DVT of leg (deep venous thrombosis) ([place]) 07/02/2015   Heart murmur    Hepatic steatosis    Heterozygous factor V Leiden mutation ([place]) 01/16/2017   Hyperlipidemia    Hypertension    Echo in 2008-technically limited, mild LVH, normal EF; 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 Merlene Laughter interpreted the study-CPAP recommended, but refused by patient   Stroke [facility])    no deficits from stroke, "Didnt know I had one when they say I did".   Past Surgical History:   Procedure Laterality Date   BIOPSY  04/16/2019   Procedure: BIOPSY;  Surgeon: Danie Binder, MD;  [facility];  Service: Endoscopy;;   CATARACT EXTRACTION W/PHACO Left 04/03/2016   Procedure: CATARACT EXTRACTION PHACO AND INTRAOCULAR LENS PLACEMENT LEFT EYE CDE=21.76;  Surgeon: Williams Che, MD;  Location: [facility];  Service: Ophthalmology;  Laterality: Left;   CATARACT EXTRACTION W/PHACO Right 06/19/2016   Procedure: CATARACT EXTRACTION PHACO AND INTRAOCULAR LENS PLACEMENT; CDE:  11.56;  Surgeon: Williams Che, MD;  Location: [facility];  Service: Ophthalmology;  Laterality: Right;   COLONOSCOPY N/A 04/16/2019   Procedure: COLONOSCOPY;  Surgeon: Danie Binder, MD;  [facility];  Service: Endoscopy;  Laterality: N/A;  8:30am   COLONOSCOPY W/ POLYPECTOMY  05/2008   polypectomy x4-adenomatous; internal hemorrhoids   CORONARY ANGIOPLASTY WITH STENT PLACEMENT  06/04/2012     DES    to [place]     x 1   DUPUYTREN / PALMAR FASCIOTOMY  2009   rt hand-dsc-went home same day x2   ESOPHAGOGASTRODUODENOSCOPY N/A 04/16/2019   Procedure: ESOPHAGOGASTRODUODENOSCOPY (EGD);  Surgeon: Danie Binder, MD;  [facility];  Service: Endoscopy;  Laterality: N/A;   LEFT HEART CATHETERIZATION WITH CORONARY ANGIOGRAM N/A 05/28/2012   Procedure: LEFT HEART CATHETERIZATION WITH CORONARY ANGIOGRAM;  Surgeon: Laverda Page, MD;  Location: [facility] CATH LAB;  Service: Cardiovascular;  Laterality: N/A;   PERCUTANEOUS CORONARY STENT INTERVENTION (PCI-S) N/A 06/04/2012   Procedure: PERCUTANEOUS CORONARY STENT INTERVENTION (PCI-S);  Surgeon: Laverda Page, MD;  Location: [facility] CATH LAB;  Service: Cardiovascular;  Laterality: N/A;   POLYPECTOMY  04/16/2019   Procedure: POLYPECTOMY;  Surgeon: Danie Binder, MD;  [facility];  Service: Endoscopy;;   TONSILLECTOMY     TOTAL HIP ARTHROPLASTY Right 06/24/2019   Procedure: TOTAL HIP ARTHROPLASTY;  Surgeon: Marchia Bond, MD;  [facility]: [facility];  Service: Orthopedics;  Laterality: Right;   VENTRAL HERNIA REPAIR  2010   umb hernia-[facility]-went home same day   Social History   Tobacco Use   Smoking status: Never   Smokeless tobacco: Never  Vaping Use   Vaping Use: Never used  Substance Use Topics   Alcohol use: No   Drug use: No   Family History  Problem Relation Age of Onset   Cancer Mother    Brain cancer Mother    Cancer Father    Prostate cancer Father    Breast cancer Daughter    Thyroid cancer Daughter    Coronary artery disease Neg Hx    Colon cancer Neg Hx    Allergies  Allergen Reactions   Empagliflozin     Patient Care Team: Johnette Abraham, MD as PCP - General (Internal Medicine) Danie Binder, MD (Inactive) as Consulting Physician (Gastroenterology)   Outpatient Medications Prior to Visit  Medication Sig   acebutolol (SECTRAL) 200 MG capsule Take 1 capsule (200 mg total) by mouth 2 (two) times daily.   apixaban (ELIQUIS) 5 MG TABS tablet Take 5 mg by mouth 2 (two) times daily.   lisinopril (ZESTRIL) 5 MG tablet Take 1 tablet (5 mg total) by mouth daily.   metFORMIN (GLUCOPHAGE) 500 MG tablet Take 1 tablet (500 mg total) by mouth 2 (two) times daily with a meal.   Multiple Vitamins-Minerals (ONE-A-DAY 50 PLUS PO) Take 1 tablet by mouth at bedtime.    nitroGLYCERIN (NITROSTAT) 0.4 MG SL tablet PLACE 1 TAB UNDER TONGUE EVERY 5 MIN IF NEEDED FOR CHEST PAIN. MAY USE 3 TIMES.NO RELIEF CALL 911.   rosuvastatin (CRESTOR) 20 MG tablet Take 1 tablet (20 mg total) by mouth daily.   Semaglutide,0.25 or 0.'5MG'$ /DOS, 2 MG/3ML SOPN Inject 1.5 mg into the skin once a week.   spironolactone (ALDACTONE) 50 MG tablet Take 1 tablet (50 mg total) by mouth every morning.   tamsulosin (FLOMAX) 0.4 MG CAPS capsule Take 1 capsule (0.4 mg total) by mouth daily.   testosterone cypionate (DEPOTESTOSTERONE CYPIONATE) 200 MG/ML injection Inject 0.5 mLs (100 mg total) into the muscle every 7 (seven) days.  (Patient taking differently: Inject 100 mg into the muscle every 14 (fourteen) days.)   torsemide (DEMADEX) 20 MG tablet Take 20 mg by mouth daily as needed (swelling).   verapamil (CALAN-SR) 240 MG CR tablet Take 1 tablet (240 mg total) by mouth at bedtime.   No facility-administered medications prior to visit.   Review of Systems  Constitutional:  Negative for chills and fever.  HENT:  Negative for sore throat.   Respiratory:  Negative for cough and shortness of breath.   Cardiovascular:  Negative for chest pain, palpitations and leg swelling.  Gastrointestinal:  Negative for abdominal pain, blood in stool, constipation, diarrhea, nausea and vomiting.  Genitourinary:  Negative for dysuria and hematuria.  Musculoskeletal:  Negative for myalgias.  Skin:  Negative for itching and rash.  Neurological:  Negative for dizziness and headaches.  Psychiatric/Behavioral:  Negative for depression and suicidal ideas.       Objective:     BP 128/84   Pulse 77   Ht '5\' 8"'$  (1.727 m)   Wt 266 lb 3.2 oz (120.7 kg)   SpO2 94%   BMI 40.48 kg/m  BP Readings from Last 3 Encounters:  11/09/22 128/84  10/23/22 130/76  10/17/22 126/83   Physical Exam Vitals reviewed.  Constitutional:      General: He is not in acute distress.    Appearance: Normal appearance. He is obese. He is not ill-appearing.  HENT:     Head: Normocephalic and atraumatic.     Right Ear: External ear normal.     Left Ear: External ear normal.     Nose: Nose normal. No congestion or rhinorrhea.     Mouth/Throat:     Mouth: Mucous membranes are moist.     Pharynx: Oropharynx is clear.  Eyes:     Extraocular Movements: Extraocular movements intact.     Conjunctiva/sclera: Conjunctivae normal.     Pupils: Pupils are equal, round, and reactive to light.  Cardiovascular:     Rate and Rhythm: Normal rate and regular rhythm.     Pulses: Normal pulses.     Heart sounds: Murmur heard.  Pulmonary:     Effort: Pulmonary effort  is normal.     Breath sounds: Normal breath sounds. No wheezing, rhonchi or rales.  Abdominal:     General: Abdomen is flat. Bowel sounds are normal. There is no distension.     Palpations: Abdomen is soft.     Tenderness: There is no abdominal tenderness.  Musculoskeletal:        General: Deformity (Dupuytren's contracture right hand) present. No swelling. Normal range of motion.     Cervical back: Normal range of motion.  Skin:    General: Skin is warm and dry.     Capillary Refill: Capillary refill takes less than 2 seconds.  Neurological:     General: No focal deficit present.     Mental Status: He is alert and oriented to person, place, and time.     Motor: No weakness.  Psychiatric:        Mood and Affect: Mood normal.        Behavior: Behavior normal.        Thought Content: Thought content normal.     Last CBC Lab Results  Component Value Date   WBC 7.5 05/21/2022   HGB 13.5 05/21/2022   HCT 40.7 05/21/2022   MCV 90.2 05/21/2022   MCH 29.9 05/21/2022   RDW 14.2 05/21/2022   PLT 195 16/07/9603   Last metabolic panel Lab Results  Component Value Date   GLUCOSE 162 (H) 06/22/2022   NA 142 06/22/2022   K 4.3 06/22/2022   CL 104 06/22/2022   CO2 19 (L) 06/22/2022   BUN 9 06/22/2022   CREATININE 1.03 06/22/2022   EGFR 74 06/22/2022   CALCIUM 9.1 06/22/2022   PHOS 2.9 08/07/2017   PROT 6.9 05/19/2022   ALBUMIN 3.6 05/19/2022   LABGLOB 2.0 11/02/2021   AGRATIO 2.1 11/02/2021   BILITOT 0.6 05/19/2022   ALKPHOS 48 05/19/2022   AST 20 05/19/2022   ALT 15  05/19/2022   ANIONGAP 8 05/21/2022   Last lipids Lab Results  Component Value Date   CHOL 151 01/27/2022   HDL 53 01/27/2022   LDLCALC 70 01/27/2022   TRIG 164 (H) 01/27/2022   CHOLHDL 2.8 01/27/2022   Last hemoglobin A1c Lab Results  Component Value Date   HGBA1C 7.5 (H) 05/19/2022   Last thyroid functions Lab Results  Component Value Date   TSH 1.630 01/27/2022   T4TOTAL 8.1 10/08/2019   Last  vitamin D Lab Results  Component Value Date   VD25OH 90.2 01/27/2022   Last vitamin B12 and Folate Lab Results  Component Value Date   VITAMINB12 231 03/11/2018      Assessment & Plan:    Routine Health Maintenance and Physical Exam  Immunization History  Administered Date(s) Administered   Fluad Quad(high Dose 65+) 06/29/2020, 06/22/2022   Influenza,inj,Quad PF,6+ Mos 07/17/2017, 08/03/2019   Influenza-Unspecified 09/15/2021   PFIZER(Purple Top)SARS-COV-2 Vaccination 10/23/2019, 11/14/2019, 08/12/2020   Pneumococcal Conjugate-13 10/08/2019   Tdap 09/09/2021    Health Maintenance  Topic Date Due   OPHTHALMOLOGY EXAM  09/03/2021   HEMOGLOBIN A1C  11/19/2022   Medicare Annual Wellness (AWV)  01/17/2023   FOOT EXAM  01/28/2023   Diabetic kidney evaluation - eGFR measurement  06/23/2023   Diabetic kidney evaluation - Urine ACR  10/07/2023   DTaP/Tdap/Td (2 - Td or Tdap) 09/10/2031   Pneumonia Vaccine 45+ Years old  Completed   INFLUENZA VACCINE  Completed   Hepatitis C Screening  Completed   Zoster Vaccines- Shingrix  Completed   HPV VACCINES  Aged Out   COLONOSCOPY (Pts 45-74yr Insurance coverage will need to be confirmed)  Discontinued   COVID-19 Vaccine  Discontinued    Discussed health benefits of physical activity, and encouraged him to engage in regular exercise appropriate for his age and condition.  Problem List Items Addressed This Visit       Encounter for routine adult health examination with abnormal findings    Presenting today for his annual physical exam.  Previous records and labs have been reviewed. -Repeat labs ordered today, to be completed prior to his next follow-up appointment in March (3/22) -Vaccines/cancer screenings are up-to-date -He is scheduled for a diabetic eye exam with ophthalmology on 5/30 -He has previously scheduled follow-up with me on 3/22      Return if symptoms worsen or fail to improve.  PJohnette Abraham MD

## 2022-11-09 NOTE — Assessment & Plan Note (Signed)
Presenting today for his annual physical exam.  Previous records and labs have been reviewed. -Repeat labs ordered today, to be completed prior to his next follow-up appointment in March (3/22) -Vaccines/cancer screenings are up-to-date -He is scheduled for a diabetic eye exam with ophthalmology on 5/30 -He has previously scheduled follow-up with me on 3/22

## 2022-11-09 NOTE — Patient Instructions (Signed)
It was a pleasure to see you today.  Thank you for giving [place] the opportunity to be involved in your care.  Below is a brief recap of your visit and next steps.  We will plan to see you again in March  Summary Annual Physical Exam completed today I will see you for follow up next month Please have labs completed prior to your appointment

## 2022-11-15 ENCOUNTER — Ambulatory Visit (INDEPENDENT_AMBULATORY_CARE_PROVIDER_SITE_OTHER): Payer: PPO | Admitting: Urology

## 2022-11-15 DIAGNOSIS — E291 Testicular hypofunction: Secondary | ICD-10-CM

## 2022-11-15 MED ORDER — TESTOSTERONE CYPIONATE 200 MG/ML IM SOLN
200.0000 mg | Freq: Once | INTRAMUSCULAR | Status: AC
  Administered 2022-11-15: 200 mg via INTRAMUSCULAR

## 2022-11-15 NOTE — Progress Notes (Unsigned)
Testosterone IM Injection  Due to Hypogonadism patient is present today for a Testosterone Injection.  Medication: Testosterone Cypionate Dose: 200 MG Location: right upper outer buttocks Lot: [id number] Exp:01/31/2024  Patient tolerated well, no complications were noted  Performed by: Marisue Brooklyn, CMA  Follow up: Keep next nurse visit

## 2022-11-29 ENCOUNTER — Ambulatory Visit (INDEPENDENT_AMBULATORY_CARE_PROVIDER_SITE_OTHER): Payer: PPO | Admitting: Urology

## 2022-11-29 ENCOUNTER — Telehealth: Payer: Self-pay

## 2022-11-29 DIAGNOSIS — E291 Testicular hypofunction: Secondary | ICD-10-CM | POA: Diagnosis not present

## 2022-11-29 MED ORDER — TESTOSTERONE CYPIONATE 200 MG/ML IM SOLN
200.0000 mg | Freq: Once | INTRAMUSCULAR | Status: AC
  Administered 2022-11-29: 200 mg via INTRAMUSCULAR

## 2022-11-29 NOTE — Progress Notes (Signed)
Testosterone IM Injection  Due to Hypogonadism patient is present today for a Testosterone Injection.  Medication: Testosterone Cypionate Dose: 200 Location: left upper outer buttocks Lot: [id number] Exp:01/2024  Patient tolerated well, no complications were noted  Performed by: Levi Aland, CMA  Follow up: Follow up as scheduled.

## 2022-11-29 NOTE — Telephone Encounter (Signed)
Patient in office for testosterone injection.  He states he will need a refill sent to pharmacy.  Does pt need any lab work done before refill?  Next f/u apt with you is 10/2023.

## 2022-12-04 NOTE — Telephone Encounter (Signed)
I tried to call patient to let him know he did not need labs prior to refill.  Can you please follow up with Dr. Diona Fanti on 03/05.  I am out of the office and I noticed he did not send in refill for testosterone yet.  If possible could you let patient know when refill is sent.  Thanks!

## 2022-12-05 ENCOUNTER — Other Ambulatory Visit: Payer: Self-pay | Admitting: Urology

## 2022-12-05 DIAGNOSIS — E291 Testicular hypofunction: Secondary | ICD-10-CM

## 2022-12-05 MED ORDER — TESTOSTERONE CYPIONATE 200 MG/ML IM SOLN: INTRAMUSCULAR | 1 refills | Status: DC

## 2022-12-13 ENCOUNTER — Ambulatory Visit (INDEPENDENT_AMBULATORY_CARE_PROVIDER_SITE_OTHER): Payer: PPO | Admitting: Urology

## 2022-12-13 DIAGNOSIS — E291 Testicular hypofunction: Secondary | ICD-10-CM

## 2022-12-13 MED ORDER — TESTOSTERONE CYPIONATE 200 MG/ML IM SOLN
200.0000 mg | Freq: Once | INTRAMUSCULAR | Status: AC
  Administered 2022-12-13: 200 mg via INTRAMUSCULAR

## 2022-12-13 NOTE — Progress Notes (Signed)
Testosterone IM Injection  Due to Hypogonadism patient is present today for a Testosterone Injection.  Medication: Testosterone Cypionate Dose: 1 ml Location: right upper outer buttocks NDC: [id number] Lot: [id number]  Patient tolerated well, no complications were noted  Performed by: Marisue Brooklyn, CMA  Follow up: Keep next NV

## 2022-12-22 ENCOUNTER — Ambulatory Visit: Payer: PPO | Admitting: Internal Medicine

## 2022-12-27 ENCOUNTER — Other Ambulatory Visit: Payer: Self-pay | Admitting: Urology

## 2022-12-27 DIAGNOSIS — E291 Testicular hypofunction: Secondary | ICD-10-CM

## 2022-12-28 ENCOUNTER — Ambulatory Visit (INDEPENDENT_AMBULATORY_CARE_PROVIDER_SITE_OTHER): Payer: PPO | Admitting: Urology

## 2022-12-28 DIAGNOSIS — E291 Testicular hypofunction: Secondary | ICD-10-CM

## 2022-12-28 MED ORDER — TESTOSTERONE CYPIONATE 200 MG/ML IM SOLN: 200.0000 mg | INTRAMUSCULAR | Status: DC

## 2022-12-28 MED ORDER — TESTOSTERONE CYPIONATE 200 MG/ML IM SOLN
200.0000 mg | Freq: Once | INTRAMUSCULAR | Status: AC
  Administered 2022-12-28: 200 mg via INTRAMUSCULAR

## 2022-12-28 NOTE — Progress Notes (Signed)
Testosterone IM Injection  Due to Hypogonadism patient is present today for a Testosterone Injection.  Medication: Testosterone Cypionate Dose: 200 Location: left upper outer buttocks  Patient tolerated well, no complications were noted  Performed by: Shelbie Franken LPN  Follow up: Keep scheduled NV

## 2022-12-29 ENCOUNTER — Ambulatory Visit (INDEPENDENT_AMBULATORY_CARE_PROVIDER_SITE_OTHER): Payer: PPO | Admitting: Internal Medicine

## 2022-12-29 ENCOUNTER — Encounter: Payer: Self-pay | Admitting: Internal Medicine

## 2022-12-29 VITALS — BP 135/86 | HR 81 | Ht 68.0 in | Wt 255.8 lb

## 2022-12-29 DIAGNOSIS — E1159 Type 2 diabetes mellitus with other circulatory complications: Secondary | ICD-10-CM

## 2022-12-29 DIAGNOSIS — G4730 Sleep apnea, unspecified: Secondary | ICD-10-CM

## 2022-12-29 DIAGNOSIS — E782 Mixed hyperlipidemia: Secondary | ICD-10-CM

## 2022-12-29 DIAGNOSIS — I10 Essential (primary) hypertension: Secondary | ICD-10-CM

## 2022-12-29 DIAGNOSIS — E559 Vitamin D deficiency, unspecified: Secondary | ICD-10-CM | POA: Diagnosis not present

## 2022-12-29 DIAGNOSIS — K760 Fatty (change of) liver, not elsewhere classified: Secondary | ICD-10-CM | POA: Diagnosis not present

## 2022-12-29 DIAGNOSIS — Z0001 Encounter for general adult medical examination with abnormal findings: Secondary | ICD-10-CM | POA: Diagnosis not present

## 2022-12-29 NOTE — Progress Notes (Unsigned)
Established Patient Office Visit  Subjective   Patient ID: Omar [name], male    DOB: Jan 30, 1946  Age: 77 y.o. MRN: [id number]  Chief Complaint  Patient presents with   Diabetes    Follow up   Mr. Flamand returns to care today for follow-up.  He was last evaluated by me on 11/09/22 for his annual exam.  There have been no interval events.  Mr. Zahra reports feeling well today.  He is asymptomatic and has no acute concerns to discuss.  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 Merlene Laughter interpreted the study-CPAP recommended, but refused by patient   Stroke [facility])    no deficits from stroke, "Didnt know I had one when they say I did".   Past Surgical History:  Procedure Laterality Date   BIOPSY  04/16/2019   Procedure: BIOPSY;  Surgeon: Danie Binder, MD;  [facility];  Service: Endoscopy;;   CATARACT EXTRACTION W/PHACO Left 04/03/2016   Procedure: CATARACT EXTRACTION PHACO AND INTRAOCULAR LENS PLACEMENT LEFT EYE CDE=21.76;  Surgeon: Williams Che, MD;  Location: [facility];  Service: Ophthalmology;  Laterality: Left;   CATARACT EXTRACTION W/PHACO Right 06/19/2016   Procedure: CATARACT EXTRACTION PHACO AND INTRAOCULAR LENS PLACEMENT; CDE:  11.56;   Surgeon: Williams Che, MD;  Location: [facility];  Service: Ophthalmology;  Laterality: Right;   COLONOSCOPY N/A 04/16/2019   Procedure: COLONOSCOPY;  Surgeon: Danie Binder, MD;  [facility];  Service: Endoscopy;  Laterality: N/A;  8:30am   COLONOSCOPY W/ POLYPECTOMY  05/2008   polypectomy x4-adenomatous; internal hemorrhoids   CORONARY ANGIOPLASTY WITH STENT PLACEMENT  06/04/2012     DES    to [place]     x 1   DUPUYTREN / PALMAR FASCIOTOMY  2009   rt hand-dsc-went home same day x2   ESOPHAGOGASTRODUODENOSCOPY N/A 04/16/2019   Procedure: ESOPHAGOGASTRODUODENOSCOPY (EGD);  Surgeon: Danie Binder, MD;  [facility];  Service: Endoscopy;  Laterality: N/A;   LEFT HEART CATHETERIZATION WITH CORONARY ANGIOGRAM N/A 05/28/2012   Procedure: LEFT HEART CATHETERIZATION WITH CORONARY ANGIOGRAM;  Surgeon: Laverda Page, MD;  Location: [facility] CATH LAB;  Service: Cardiovascular;  Laterality: N/A;   PERCUTANEOUS CORONARY STENT INTERVENTION (PCI-S) N/A 06/04/2012   Procedure: PERCUTANEOUS CORONARY STENT INTERVENTION (PCI-S);  Surgeon: Laverda Page, MD;  Location: [facility] CATH LAB;  Service: Cardiovascular;  Laterality: N/A;   POLYPECTOMY  04/16/2019   Procedure: POLYPECTOMY;  Surgeon: Danie Binder, MD;  [facility];  Service: Endoscopy;;   TONSILLECTOMY     TOTAL HIP ARTHROPLASTY Right  06/24/2019   Procedure: TOTAL HIP ARTHROPLASTY;  Surgeon: Marchia Bond, MD;  [facility];  Service: Orthopedics;  Laterality: Right;   VENTRAL HERNIA REPAIR  2010   umb hernia-[facility]-went home same day   Social History   Tobacco Use   Smoking status: Never   Smokeless tobacco: Never  Vaping Use   Vaping Use: Never used  Substance Use Topics   Alcohol use: No   Drug use: No   Family History  Problem Relation Age of Onset   Cancer Mother    Brain cancer Mother    Cancer Father    Prostate cancer Father    Breast cancer Daughter    Thyroid cancer  Daughter    Coronary artery disease Neg Hx    Colon cancer Neg Hx    Allergies  Allergen Reactions   Empagliflozin    Review of Systems  Constitutional:  Negative for chills and fever.  HENT:  Negative for sore throat.   Respiratory:  Negative for cough and shortness of breath.   Cardiovascular:  Negative for chest pain, palpitations and leg swelling.  Gastrointestinal:  Negative for abdominal pain, blood in stool, constipation, diarrhea, nausea and vomiting.  Genitourinary:  Negative for dysuria and hematuria.  Musculoskeletal:  Negative for myalgias.  Skin:  Negative for itching and rash.  Neurological:  Negative for dizziness and headaches.  Psychiatric/Behavioral:  Negative for depression and suicidal ideas.      Objective:     BP 135/86   Pulse 81   Ht 5\' 8"  (1.727 m)   Wt 255 lb 12.8 oz (116 kg)   SpO2 91%   BMI 38.89 kg/m  BP Readings from Last 3 Encounters:  12/29/22 135/86  11/09/22 128/84  10/23/22 130/76   Physical Exam Vitals reviewed.  Constitutional:      General: He is not in acute distress.    Appearance: Normal appearance. He is obese. He is not ill-appearing.  HENT:     Head: Normocephalic and atraumatic.     Right Ear: External ear normal.     Left Ear: External ear normal.     Nose: Nose normal. No congestion or rhinorrhea.     Mouth/Throat:     Mouth: Mucous membranes are moist.     Pharynx: Oropharynx is clear.  Eyes:     Extraocular Movements: Extraocular movements intact.     Conjunctiva/sclera: Conjunctivae normal.     Pupils: Pupils are equal, round, and reactive to light.  Cardiovascular:     Rate and Rhythm: Normal rate and regular rhythm.     Pulses: Normal pulses.     Heart sounds: Murmur heard.  Pulmonary:     Effort: Pulmonary effort is normal.     Breath sounds: Normal breath sounds. No wheezing, rhonchi or rales.  Abdominal:     General: Abdomen is flat. Bowel sounds are normal. There is no distension.     Palpations:  Abdomen is soft.     Tenderness: There is no abdominal tenderness.  Musculoskeletal:        General: Deformity (Dupuytren's contractures on both hands) present. No swelling. Normal range of motion.     Cervical back: Normal range of motion.  Skin:    General: Skin is warm and dry.     Capillary Refill: Capillary refill takes less than 2 seconds.  Neurological:     General: No focal deficit present.     Mental Status: He is alert and oriented to person, place, and  time.     Motor: No weakness.  Psychiatric:        Mood and Affect: Mood normal.        Behavior: Behavior normal.        Thought Content: Thought content normal.    Diabetic foot exam was performed.  Visual Findings: Callus formation on both heels Posterior tibialis and dorsalis pulse intact bilaterally.  Intact to touch and monofilament testing bilaterally.   Last CBC Lab Results  Component Value Date   WBC 8.2 12/29/2022   HGB 17.0 12/29/2022   HCT 49.8 12/29/2022   MCV 89 12/29/2022   MCH 30.2 12/29/2022   RDW 14.3 12/29/2022   PLT 255 0000000   Last metabolic panel Lab Results  Component Value Date   GLUCOSE 114 (H) 12/29/2022   NA 138 12/29/2022   K 4.8 12/29/2022   CL 100 12/29/2022   CO2 23 12/29/2022   BUN 14 12/29/2022   CREATININE 1.13 12/29/2022   EGFR 67 12/29/2022   CALCIUM 10.1 12/29/2022   PHOS 2.9 08/07/2017   PROT 6.2 12/29/2022   ALBUMIN 4.0 12/29/2022   LABGLOB 2.2 12/29/2022   AGRATIO 1.8 12/29/2022   BILITOT 0.4 12/29/2022   ALKPHOS 68 12/29/2022   AST 17 12/29/2022   ALT 15 12/29/2022   ANIONGAP 8 05/21/2022   Last lipids Lab Results  Component Value Date   CHOL 159 12/29/2022   HDL 42 12/29/2022   LDLCALC 85 12/29/2022   TRIG 187 (H) 12/29/2022   CHOLHDL 3.8 12/29/2022   Last hemoglobin A1c Lab Results  Component Value Date   HGBA1C 7.6 (H) 12/29/2022   Last thyroid functions Lab Results  Component Value Date   TSH 2.210 12/29/2022   T4TOTAL 8.1 10/08/2019    Last vitamin D Lab Results  Component Value Date   VD25OH 40.8 12/29/2022   Last vitamin B12 and Folate Lab Results  Component Value Date   VITAMINB12 466 12/29/2022   FOLATE 18.5 12/29/2022      Assessment & Plan:   Problem List Items Addressed This Visit       Essential hypertension - Primary    BP remains within goal on current regimen of lisinopril 5 mg daily, spironolactone 50 mg daily, acebutolol 200 mg twice daily, and verapamil 240 mg nightly. -No medication changes today      Type 2 diabetes mellitus with vascular disease    A1c 7.5 in August.  He is currently prescribed Ozempic 0.5 mg weekly and metformin 500 mg twice daily.Larna Daughters is managed by a provider at the [place]. -Repeat A1c ordered today -Diabetic foot exam completed today -Diabetic eye exam scheduled for May -New order for diabetic shoes placed as he has calluses on both heels      Sleep apnea    Previously documented history of OSA.  He continues to refuse CPAP.  Patient is aware of the dangers and potential complications of untreated OSA.      Mixed hyperlipidemia    Lipid panel last updated in April 2023.  Total cholesterol 151 and LDL 70.  He is currently prescribed rosuvastatin 20 mg daily. -Repeat lipid panel ordered today       Return in about 6 months (around 07/01/2023).    Johnette Abraham, MD

## 2022-12-29 NOTE — Patient Instructions (Signed)
It was a pleasure to see you today.  Thank you for giving us the opportunity to be involved in your care.  Below is a brief recap of your visit and next steps.  We will plan to see you again in 6 months.  Summary No medication changes today Repeat labs have been ordered We will follow up in 6 months.  

## 2022-12-30 LAB — CMP14+EGFR
ALT: 15 IU/L (ref 0–44)
AST: 17 IU/L (ref 0–40)
Albumin/Globulin Ratio: 1.8 (ref 1.2–2.2)
Albumin: 4 g/dL (ref 3.8–4.8)
Alkaline Phosphatase: 68 IU/L (ref 44–121)
BUN/Creatinine Ratio: 12 (ref 10–24)
BUN: 14 mg/dL (ref 8–27)
Bilirubin Total: 0.4 mg/dL (ref 0.0–1.2)
CO2: 23 mmol/L (ref 20–29)
Calcium: 10.1 mg/dL (ref 8.6–10.2)
Chloride: 100 mmol/L (ref 96–106)
Creatinine, Ser: 1.13 mg/dL (ref 0.76–1.27)
Globulin, Total: 2.2 g/dL (ref 1.5–4.5)
Glucose: 114 mg/dL — ABNORMAL HIGH (ref 70–99)
Potassium: 4.8 mmol/L (ref 3.5–5.2)
Sodium: 138 mmol/L (ref 134–144)
Total Protein: 6.2 g/dL (ref 6.0–8.5)
eGFR: 67 mL/min/{1.73_m2} (ref >59)

## 2022-12-30 LAB — LIPID PANEL
Chol/HDL Ratio: 3.8 ratio (ref 0.0–5.0)
Cholesterol, Total: 159 mg/dL (ref 100–199)
HDL: 42 mg/dL (ref >39)
LDL Chol Calc (NIH): 85 mg/dL (ref 0–99)
Triglycerides: 187 mg/dL — ABNORMAL HIGH (ref 0–149)
VLDL Cholesterol Cal: 32 mg/dL (ref 5–40)

## 2022-12-30 LAB — CBC WITH DIFFERENTIAL/PLATELET
Basophils Absolute: 0.1 10*3/uL (ref 0.0–0.2)
Basos: 1 %
EOS (ABSOLUTE): 0.2 10*3/uL (ref 0.0–0.4)
Eos: 3 %
Hematocrit: 49.8 % (ref 37.5–51.0)
Hemoglobin: 17 g/dL (ref 13.0–17.7)
Immature Grans (Abs): 0 10*3/uL (ref 0.0–0.1)
Immature Granulocytes: 0 %
Lymphocytes Absolute: 2.5 10*3/uL (ref 0.7–3.1)
Lymphs: 30 %
MCH: 30.2 pg (ref 26.6–33.0)
MCHC: 34.1 g/dL (ref 31.5–35.7)
MCV: 89 fL (ref 79–97)
Monocytes Absolute: 0.7 10*3/uL (ref 0.1–0.9)
Monocytes: 9 %
Neutrophils Absolute: 4.7 10*3/uL (ref 1.4–7.0)
Neutrophils: 57 %
Platelets: 255 10*3/uL (ref 150–450)
RBC: 5.63 x10E6/uL (ref 4.14–5.80)
RDW: 14.3 % (ref 11.6–15.4)
WBC: 8.2 10*3/uL (ref 3.4–10.8)

## 2022-12-30 LAB — HEMOGLOBIN A1C
Est. average glucose Bld gHb Est-mCnc: 171 mg/dL
Hgb A1c MFr Bld: 7.6 % — ABNORMAL HIGH (ref 4.8–5.6)

## 2022-12-30 LAB — TSH+FREE T4
Free T4: 0.88 ng/dL (ref 0.82–1.77)
TSH: 2.21 u[IU]/mL (ref 0.450–4.500)

## 2022-12-30 LAB — B12 AND FOLATE PANEL
Folate: 18.5 ng/mL (ref >3.0)
Vitamin B-12: 466 pg/mL (ref 232–1245)

## 2022-12-30 LAB — VITAMIN D 25 HYDROXY (VIT D DEFICIENCY, FRACTURES): Vit D, 25-Hydroxy: 40.8 ng/mL (ref 30.0–100.0)

## 2023-01-01 ENCOUNTER — Other Ambulatory Visit: Payer: Self-pay | Admitting: Internal Medicine

## 2023-01-01 DIAGNOSIS — E782 Mixed hyperlipidemia: Secondary | ICD-10-CM

## 2023-01-01 MED ORDER — ROSUVASTATIN CALCIUM 40 MG PO TABS: 40.0000 mg | ORAL_TABLET | Freq: Every day | ORAL | 3 refills | Status: DC

## 2023-01-04 ENCOUNTER — Encounter: Payer: Self-pay | Admitting: Internal Medicine

## 2023-01-04 NOTE — Assessment & Plan Note (Signed)
Lipid panel last updated in April 2023.  Total cholesterol 151 and LDL 70.  He is currently prescribed rosuvastatin 20 mg daily. -Repeat lipid panel ordered today

## 2023-01-04 NOTE — Assessment & Plan Note (Signed)
Previously documented history of OSA.  He continues to refuse CPAP.  Patient is aware of the dangers and potential complications of untreated OSA.

## 2023-01-04 NOTE — Assessment & Plan Note (Signed)
BP remains within goal on current regimen of lisinopril 5 mg daily, spironolactone 50 mg daily, acebutolol 200 mg twice daily, and verapamil 240 mg nightly. -No medication changes today

## 2023-01-04 NOTE — Assessment & Plan Note (Signed)
A1c 7.5 in August.  He is currently prescribed Ozempic 0.5 mg weekly and metformin 500 mg twice daily.Omar [name] is managed by a provider at the [place]. -Repeat A1c ordered today

## 2023-01-11 ENCOUNTER — Ambulatory Visit (INDEPENDENT_AMBULATORY_CARE_PROVIDER_SITE_OTHER): Payer: PPO | Admitting: Urology

## 2023-01-11 DIAGNOSIS — E291 Testicular hypofunction: Secondary | ICD-10-CM | POA: Diagnosis not present

## 2023-01-11 MED ORDER — TESTOSTERONE CYPIONATE 200 MG/ML IM SOLN: 100.0000 mg | Freq: Once | INTRAMUSCULAR | Status: DC

## 2023-01-11 MED ORDER — TESTOSTERONE CYPIONATE 200 MG/ML IM SOLN
200.0000 mg | Freq: Once | INTRAMUSCULAR | Status: DC
  Administered 2023-01-11: 200 mg via INTRAMUSCULAR

## 2023-01-11 NOTE — Progress Notes (Addendum)
Testosterone IM Injection  Due to Hypogonadism patient is present today for a Testosterone Injection.  Medication: Testosterone Cypionate Dose: 200 Location: right upper outer buttocks Lot: [id number] A Exp:06/02/2024  Patient tolerated well, no complications were noted  Performed by: Kennyth Lose, CMA  Follow up: Keep Scheduled Nurse Visit

## 2023-01-25 ENCOUNTER — Ambulatory Visit: Payer: PPO

## 2023-01-26 ENCOUNTER — Ambulatory Visit (INDEPENDENT_AMBULATORY_CARE_PROVIDER_SITE_OTHER): Payer: PPO

## 2023-01-26 DIAGNOSIS — E291 Testicular hypofunction: Secondary | ICD-10-CM | POA: Diagnosis not present

## 2023-01-26 MED ORDER — TESTOSTERONE CYPIONATE 200 MG/ML IM SOLN
200.0000 mg | Freq: Once | INTRAMUSCULAR | Status: AC
  Administered 2023-01-26: 200 mg via INTRAMUSCULAR

## 2023-01-26 NOTE — Addendum Note (Signed)
Addended by: Christoper Fabian R on: 01/26/2023 09:47 AM   Modules accepted: Level of Service

## 2023-01-26 NOTE — Progress Notes (Addendum)
Testosterone IM Injection  Due to Hypogonadism patient is present today for a Testosterone Injection.  Medication: Testosterone Cypionate Dose: 200 Location: right upper outer buttocks Lot: [id number]  Patient tolerated well, no complications were noted  Performed by: Kennyth Lose, CMA  Follow up: Keep  scheduled nurse visit

## 2023-01-29 ENCOUNTER — Ambulatory Visit (INDEPENDENT_AMBULATORY_CARE_PROVIDER_SITE_OTHER): Payer: PPO | Admitting: Family Medicine

## 2023-01-29 ENCOUNTER — Other Ambulatory Visit: Payer: Self-pay | Admitting: Cardiology

## 2023-01-29 ENCOUNTER — Encounter: Payer: Self-pay | Admitting: Family Medicine

## 2023-01-29 VITALS — BP 118/68 | HR 67 | Ht 68.0 in | Wt 253.1 lb

## 2023-01-29 DIAGNOSIS — I10 Essential (primary) hypertension: Secondary | ICD-10-CM

## 2023-01-29 DIAGNOSIS — Z0000 Encounter for general adult medical examination without abnormal findings: Secondary | ICD-10-CM | POA: Diagnosis not present

## 2023-01-29 NOTE — Patient Instructions (Signed)
        Great to see you today.   - Please take medications as prescribed. - Follow up with your primary health provider if any health concerns arises. - If symptoms worsen please contact your primary care provider and/or visit the emergency department.  

## 2023-01-29 NOTE — Progress Notes (Addendum)
Subjective:   Omar [name] is a 77 y.o. male who presents for Medicare Annual/Subsequent preventive examination.  Review of Systems    Patient denies pain, fever, chills, chest pain, palpations ,shortness of breath, blurred vision,cough, abdominal pain, nausea, vomiting, headache, dizziness. Patient is not feeling nervous or anxious.        Objective:    There were no vitals filed for this visit. There is no height or weight on file to calculate BMI.     05/19/2022    6:12 AM 01/16/2022    4:41 PM 06/24/2019    5:00 PM 06/18/2019   10:13 AM 04/16/2019    7:34 AM 02/02/2019   12:24 PM 07/17/2017   10:48 AM  Advanced Directives  Does Patient Have a Medical Advance Directive? No Yes No No No No Yes  Type of Advance Directive  Living will;Healthcare Power of [employer] Power of Attorney  Does patient want to make changes to medical advance directive?       No - Patient declined  Copy of Healthcare Power of Attorney in Chart?  No - copy requested     No - copy requested  Would patient like information on creating a medical advance directive? No - Patient declined  No - Patient declined No - Patient declined No - Patient declined      Current Medications (verified) Outpatient Encounter Medications as of 01/29/2023  Medication Sig   acebutolol (SECTRAL) 200 MG capsule TAKE (1) CAPSULE BY MOUTH TWICE DAILY.   apixaban (ELIQUIS) 5 MG TABS tablet Take 5 mg by mouth 2 (two) times daily.   lisinopril (ZESTRIL) 5 MG tablet Take 1 tablet (5 mg total) by mouth daily.   metFORMIN (GLUCOPHAGE) 500 MG tablet Take 1 tablet (500 mg total) by mouth 2 (two) times daily with a meal.   Multiple Vitamins-Minerals (ONE-A-DAY 50 PLUS PO) Take 1 tablet by mouth at bedtime.    nitroGLYCERIN (NITROSTAT) 0.4 MG SL tablet PLACE 1 TAB UNDER TONGUE EVERY 5 MIN IF NEEDED FOR CHEST PAIN. MAY USE 3 TIMES.NO RELIEF CALL 911.   rosuvastatin (CRESTOR) 40 MG tablet Take 1 tablet (40 mg total) by mouth  daily.   Semaglutide,0.25 or 0.5MG /DOS, 2 MG/3ML SOPN Inject 1.5 mg into the skin once a week.   spironolactone (ALDACTONE) 50 MG tablet Take 1 tablet (50 mg total) by mouth every morning.   tamsulosin (FLOMAX) 0.4 MG CAPS capsule Take 1 capsule (0.4 mg total) by mouth daily.   testosterone cypionate (DEPOTESTOSTERONE CYPIONATE) 200 MG/ML injection 100 mg IM q 2 weeks   torsemide (DEMADEX) 20 MG tablet Take 20 mg by mouth daily as needed (swelling).   verapamil (CALAN-SR) 240 MG CR tablet Take 1 tablet (240 mg total) by mouth at bedtime.   No facility-administered encounter medications on file as of 01/29/2023.    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 Gerilyn Pilgrim interpreted the study-CPAP recommended, but refused by patient   Stroke [facility])    no deficits from stroke, "Didnt know I had one when they say I did".   Past Surgical History:  Procedure Laterality Date   BIOPSY  04/16/2019   Procedure: BIOPSY;  Surgeon: West Bali, MD;  Location: [facility];  Service: Endoscopy;;   CATARACT EXTRACTION W/PHACO Left 04/03/2016   Procedure: CATARACT EXTRACTION PHACO AND INTRAOCULAR LENS PLACEMENT LEFT EYE CDE=21.76;   Surgeon: Susa Simmonds, MD;  Location: [facility] OR[facility];  Service: Ophthalmology;  Laterality: Left;   CATARACT EXTRACTION W/PHACO Right 06/19/2016   Procedure: CATARACT EXTRACTION PHACO AND INTRAOCULAR LENS PLACEMENT; CDE:  11.56;  Surgeon: Susa Simmonds, MD;  Location: [facility];  Service: Ophthalmology;  Laterality: Right;   COLONOSCOPY N/A 04/16/2019   Procedure: COLONOSCOPY;  Surgeon: West Bali, MD;  [facility];  Service: Endoscopy;  Laterality: N/A;  8:30am   COLONOSCOPY W/ POLYPECTOMY  05/2008   polypectomy x4-adenomatous; internal hemorrhoids   CORONARY ANGIOPLASTY WITH STENT PLACEMENT  06/04/2012     DES    to [place]   CORONARY STENT PLACEMENT     x 1   DUPUYTREN / PALMAR FASCIOTOMY  2009   rt hand-dsc-went home same day x2   ESOPHAGOGASTRODUODENOSCOPY N/A 04/16/2019   Procedure: ESOPHAGOGASTRODUODENOSCOPY (EGD);  Surgeon: West Bali, MD;  [facility];  Service: Endoscopy;  Laterality: N/A;   LEFT HEART CATHETERIZATION WITH CORONARY ANGIOGRAM N/A 05/28/2012   Procedure: LEFT HEART CATHETERIZATION WITH CORONARY ANGIOGRAM;  Surgeon: Pamella Pert, MD;  Location: [facility] CATH LAB;  Service: Cardiovascular;  Laterality: N/A;   PERCUTANEOUS CORONARY STENT INTERVENTION (PCI-S) N/A 06/04/2012   Procedure: PERCUTANEOUS CORONARY STENT INTERVENTION (PCI-S);  Surgeon: Pamella Pert, MD;  Location: [facility] CATH LAB;  Service: Cardiovascular;  Laterality: N/A;   POLYPECTOMY  04/16/2019   Procedure: POLYPECTOMY;  Surgeon: West Bali, MD;  [facility];  Service: Endoscopy;;   TONSILLECTOMY     TOTAL HIP ARTHROPLASTY Right 06/24/2019   Procedure: TOTAL HIP ARTHROPLASTY;  Surgeon: Teryl Lucy, MD;  Location: [facility];  Service: Orthopedics;  Laterality: Right;   VENTRAL HERNIA REPAIR  2010   umb hernia-[facility]-went home same day   Family History  Problem Relation Age of Onset   Cancer Mother    Brain cancer Mother    Cancer Father    Prostate cancer Father     Breast cancer Daughter    Thyroid cancer Daughter    Coronary artery disease Neg Hx    Colon cancer Neg Hx    Social History   Socioeconomic History   Marital status: Married    Spouse name: Not on file   Number of children: 2   Years of education: Not on file   Highest education level: Not on file  Occupational History   Occupation: Airline pilot    Comment: Doctor, general practice  Tobacco Use   Smoking status: Never   Smokeless tobacco: Never  Vaping Use   Vaping Use: Never used  Substance and Sexual Activity   Alcohol use: No   Drug use: No  Sexual activity: Not Currently    Birth control/protection: None  Other Topics Concern   Not on file  Social History Narrative   Married for 16 yrs,lives with wife.Works  part time at [employer].   Social Determinants of Health   Financial Resource Strain: Not on file  Food Insecurity: Not on file  Transportation Needs: Not on file  Physical Activity: Not on file  Stress: Not on file  Social Connections: Not on file    Tobacco Counseling Counseling given: Not Answered   Clinical Intake:                 Diabetic? Yes, Hemoglobin A1c 7.6         Activities of Daily Living    05/19/2022   11:15 AM  In your present state of health, do you have any difficulty performing the following activities:  Doing errands, shopping? 0    Patient Care Team: Billie Lade, MD as PCP - General (Internal Medicine) West Bali, MD (Inactive) as Consulting Physician (Gastroenterology)  Indicate any recent Medical Services you may have received from other than [facility] providers in the past year (date may be approximate).     Assessment:   This is a routine wellness examination for Jandre.  Hearing/Vision screen No results found.  Dietary issues and exercise activities discussed:   Discussed DASH diet which includes vegetables,fruits,whole grains, fat free or low fat  diary,fish,poultry,beans,nuts and seeds,vegetable oils. Find an activity that you will enjoy and start to be active at least 5 days a week for 30 minutes each day.   Goals Addressed   None    Depression Screen    12/29/2022    4:00 PM 11/09/2022    4:15 PM 09/22/2022    4:11 PM 06/22/2022    8:45 AM 05/29/2022    2:10 PM 03/31/2022   11:02 AM 01/27/2022   11:06 AM  PHQ 2/9 Scores  PHQ - 2 Score 0 0 0 0 0 0 0  PHQ- 9 Score 0 2 0        Fall Risk    12/29/2022    4:00 PM 11/09/2022    4:15 PM 09/22/2022    4:11 PM 06/22/2022    8:45 AM 05/29/2022    2:10 PM  Fall Risk   Falls in the past year? 0 0 0 0 0  Number falls in past yr: 0 0 0 0 0  Injury with Fall? 0 0 0 0 0  Risk for fall due to : No Fall Risks No Fall Risks No Fall Risks No Fall Risks No Fall Risks  Follow up Falls evaluation completed Falls evaluation completed Falls evaluation completed Falls evaluation completed Falls evaluation completed    FALL RISK PREVENTION PERTAINING TO THE HOME:  Any stairs in or around the home? No  If so, are there any without handrails? No  Home free of loose throw rugs in walkways, pet beds, electrical cords, etc? Yes  Adequate lighting in your home to reduce risk of falls? Yes   ASSISTIVE DEVICES UTILIZED TO PREVENT FALLS:  Life alert? No  Use of a cane, walker or w/c? No  Grab bars in the bathroom? No  Shower chair or bench in shower? Yes  Elevated toilet seat or a handicapped toilet? No   TIMED UP AND GO:  Was the test performed? Yes .  Length of time to ambulate 10 feet: 6 sec.   Gait steady and fast without use  of assistive device  Cognitive Function:        01/16/2022    4:44 PM  6CIT Screen  What Year? 0 points  What month? 0 points  What time? 0 points  Count back from 20 0 points  Repeat phrase 2 points    Immunizations Immunization History  Administered Date(s) Administered   Fluad Quad(high Dose 65+) 06/29/2020, 06/22/2022   Influenza,inj,Quad PF,6+ Mos  07/17/2017, 08/03/2019   Influenza-Unspecified 09/15/2021   PFIZER(Purple Top)SARS-COV-2 Vaccination 10/23/2019, 11/14/2019, 08/12/2020   Pneumococcal Conjugate-13 10/08/2019   Tdap 09/09/2021    TDAP status: Up to date  Flu Vaccine status: Due, Education has been provided regarding the importance of this vaccine. Advised may receive this vaccine at local pharmacy or Health Dept. Aware to provide a copy of the vaccination record if obtained from local pharmacy or Health Dept. Verbalized acceptance and understanding.  Pneumococcal vaccine status: Up to date  Covid-19 vaccine status: Completed vaccines  Qualifies for Shingles Vaccine? Yes   Zostavax completed Yes   Shingrix Completed?: Yes  Screening Tests Health Maintenance  Topic Date Due   OPHTHALMOLOGY EXAM  09/03/2021   INFLUENZA VACCINE  05/03/2023   HEMOGLOBIN A1C  07/01/2023   Diabetic kidney evaluation - Urine ACR  10/07/2023   Diabetic kidney evaluation - eGFR measurement  12/29/2023   FOOT EXAM  12/29/2023   Medicare Annual Wellness (AWV)  01/29/2024   DTaP/Tdap/Td (2 - Td or Tdap) 09/10/2031   Pneumonia Vaccine 72+ Years old  Completed   Hepatitis C Screening  Completed   Zoster Vaccines- Shingrix  Completed   HPV VACCINES  Aged Out   COLONOSCOPY (Pts 45-44yrs Insurance coverage will need to be confirmed)  Discontinued   COVID-19 Vaccine  Discontinued    Health Maintenance  Health Maintenance Due  Topic Date Due   OPHTHALMOLOGY EXAM  09/03/2021    Colorectal cancer screening: No longer required.   Lung Cancer Screening: (Low Dose CT Chest recommended if Age 77-80 years, 30 pack-year currently smoking OR have quit w/in 15years.) does not qualify.   Lung Cancer Screening Referral: N/A  Additional Screening:  Hepatitis C Screening: does not qualify; Completed 2022 Negative  Vision Screening: Recommended annual ophthalmology exams for early detection of glaucoma and other disorders of the eye. Is the  patient up to date with their annual eye exam?  Yes  Who is the provider or what is the name of the office in which the patient attends annual eye exams? Dr.Cotter [email or web address] If pt is not established with a provider, would they like to be referred to a provider to establish care? No .   Dental Screening: Recommended annual dental exams for proper oral hygiene  Community Resource Referral / Chronic Care Management: CRR required this visit?  No   CCM required this visit?  No      Plan:     I have personally reviewed and noted the following in the patient's chart:   Medical and social history Use of alcohol, tobacco or illicit drugs  Current medications and supplements including opioid prescriptions. Patient is not currently taking opioid prescriptions. Functional ability and status Nutritional status Physical activity Advanced directives List of other physicians Hospitalizations, surgeries, and ER visits in previous 12 months Vitals Screenings to include cognitive, depression, and falls Referrals and appointments  In addition, I have reviewed and discussed with patient certain preventive protocols, quality metrics, and best practice recommendations. A written personalized care plan for preventive services as well as general  preventive health recommendations were provided to patient.     Abner Greenspan, LPN   05/12/9146   Konrad Dolores Polanco, FNP-C       01/29/2023

## 2023-02-08 ENCOUNTER — Ambulatory Visit (INDEPENDENT_AMBULATORY_CARE_PROVIDER_SITE_OTHER): Payer: PPO

## 2023-02-08 DIAGNOSIS — N390 Urinary tract infection, site not specified: Secondary | ICD-10-CM

## 2023-02-08 DIAGNOSIS — N5201 Erectile dysfunction due to arterial insufficiency: Secondary | ICD-10-CM

## 2023-02-08 DIAGNOSIS — E291 Testicular hypofunction: Secondary | ICD-10-CM

## 2023-02-08 MED ORDER — TESTOSTERONE CYPIONATE 200 MG/ML IM SOLN
200.0000 mg | Freq: Once | INTRAMUSCULAR | Status: AC
  Administered 2023-02-08: 200 mg via INTRAMUSCULAR

## 2023-02-08 NOTE — Progress Notes (Signed)
Testosterone injection  Medication: Testosterone  Dose: 200 Location: Left upper outer buttocks Lot: [id number] Exp: [id number]  Patient tolerated well, no complications were noted  Performed by: Kennyth Lose, CMA

## 2023-02-22 ENCOUNTER — Ambulatory Visit (INDEPENDENT_AMBULATORY_CARE_PROVIDER_SITE_OTHER): Payer: PPO

## 2023-02-22 DIAGNOSIS — E291 Testicular hypofunction: Secondary | ICD-10-CM

## 2023-02-22 MED ORDER — TESTOSTERONE CYPIONATE 200 MG/ML IM SOLN
200.0000 mg | Freq: Once | INTRAMUSCULAR | Status: AC
Start: 2023-02-22 — End: 2023-02-22
  Administered 2023-02-22: 200 mg via INTRAMUSCULAR

## 2023-02-22 NOTE — Progress Notes (Signed)
Testosterone IM Injection  Due to Hypogonadism patient is present today for a Testosterone Injection.  Medication: Testosterone Cypionate Dose: 200 Location: right upper outer buttocks Lot: [id number]  Patient tolerated well, no complications were noted  Performed by: Kennyth Lose, CMA  Follow up: Keep nurse visit

## 2023-02-28 ENCOUNTER — Encounter: Payer: Self-pay | Admitting: Internal Medicine

## 2023-02-28 ENCOUNTER — Telehealth (INDEPENDENT_AMBULATORY_CARE_PROVIDER_SITE_OTHER): Payer: PPO | Admitting: Internal Medicine

## 2023-02-28 DIAGNOSIS — Z7901 Long term (current) use of anticoagulants: Secondary | ICD-10-CM

## 2023-02-28 DIAGNOSIS — W501XXA Accidental kick by another person, initial encounter: Secondary | ICD-10-CM | POA: Diagnosis not present

## 2023-02-28 DIAGNOSIS — S8991XA Unspecified injury of right lower leg, initial encounter: Secondary | ICD-10-CM | POA: Diagnosis not present

## 2023-02-28 DIAGNOSIS — T148XXA Other injury of unspecified body region, initial encounter: Secondary | ICD-10-CM | POA: Insufficient documentation
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
## 2023-03-01 DIAGNOSIS — E119 Type 2 diabetes mellitus without complications: Secondary | ICD-10-CM | POA: Diagnosis not present

## 2023-03-01 LAB — HM DIABETES EYE EXAM

## 2023-03-06 ENCOUNTER — Telehealth: Payer: Self-pay | Admitting: Internal Medicine

## 2023-03-06 NOTE — Telephone Encounter (Signed)
I spoke to patient's wife. I will speak to Dr.Dixon today regarding in office ultrasound vs sending patient for imaging and follow up with patients this afternoon with a plan.

## 2023-03-06 NOTE — Telephone Encounter (Signed)
Patient called request ultrasound right leg, per patient spouse Dr Barbaraann Faster seen patient last week and today leg no better, patient spouse asked if an ultrasound order be ordered? Coming in to see Dr Durwin Nora 06.06.2024 for a follow up on leg. Please call patient spouse at [phone] to let her know either way.

## 2023-03-08 ENCOUNTER — Encounter: Payer: Self-pay | Admitting: Internal Medicine

## 2023-03-08 ENCOUNTER — Ambulatory Visit (INDEPENDENT_AMBULATORY_CARE_PROVIDER_SITE_OTHER): Payer: PPO | Admitting: Internal Medicine

## 2023-03-08 ENCOUNTER — Ambulatory Visit (INDEPENDENT_AMBULATORY_CARE_PROVIDER_SITE_OTHER): Payer: PPO

## 2023-03-08 VITALS — BP 113/74 | HR 78 | Ht 68.0 in | Wt 247.2 lb

## 2023-03-08 DIAGNOSIS — R2241 Localized swelling, mass and lump, right lower limb: Secondary | ICD-10-CM

## 2023-03-08 DIAGNOSIS — E291 Testicular hypofunction: Secondary | ICD-10-CM | POA: Diagnosis not present

## 2023-03-08 MED ORDER — TESTOSTERONE CYPIONATE 200 MG/ML IM SOLN
200.0000 mg | Freq: Once | INTRAMUSCULAR | Status: AC
Start: 2023-03-08 — End: 2023-03-08
  Administered 2023-03-08: 200 mg via INTRAMUSCULAR

## 2023-03-08 NOTE — Progress Notes (Signed)
IM injection  Medication: TESTOSTERONE Dose: 200MG  Location: LEFT BUTTOCK Lot: [id number] Exp: 06/02/2024  Patient tolerated well, no complications were noted  Performed by: Allye Hoyos LPN
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
## 2023-03-08 NOTE — Patient Instructions (Signed)
It was a pleasure to see you today.  Thank you for giving [place] the opportunity to be involved in your care.  Below is a brief recap of your visit and next steps.  We will plan to see you again in October.  Summary Right leg ultrasound ordered today Please see the attached information regarding hematomas Follow up in October but return to care before then if symptoms worsen or fail to improve
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
## 2023-03-12 ENCOUNTER — Ambulatory Visit (HOSPITAL_COMMUNITY): Payer: PPO

## 2023-03-14 ENCOUNTER — Ambulatory Visit (HOSPITAL_COMMUNITY)
Admission: RE | Admit: 2023-03-14 | Discharge: 2023-03-14 | Disposition: A | Discharge status: Home / Self Care | Payer: PPO | Source: Ambulatory Visit | Attending: Internal Medicine | Admitting: Internal Medicine

## 2023-03-14 DIAGNOSIS — R2241 Localized swelling, mass and lump, right lower limb: Secondary | ICD-10-CM | POA: Insufficient documentation

## 2023-03-15 ENCOUNTER — Ambulatory Visit (HOSPITAL_COMMUNITY): Payer: PPO

## 2023-03-22 ENCOUNTER — Ambulatory Visit (HOSPITAL_COMMUNITY): Payer: PPO

## 2023-03-22 ENCOUNTER — Ambulatory Visit (INDEPENDENT_AMBULATORY_CARE_PROVIDER_SITE_OTHER): Payer: PPO

## 2023-03-22 DIAGNOSIS — E291 Testicular hypofunction: Secondary | ICD-10-CM

## 2023-03-22 MED ORDER — TESTOSTERONE CYPIONATE 200 MG/ML IM SOLN
200.0000 mg | Freq: Once | INTRAMUSCULAR | Status: AC
Start: 2023-03-22 — End: 2023-03-22
  Administered 2023-03-22: 200 mg via INTRAMUSCULAR

## 2023-03-22 NOTE — Progress Notes (Addendum)
Testosterone IM Injection  Due to Hypogonadism patient is present today for a Testosterone Injection.  Medication: Testosterone Cypionate Dose: 200MG  Location: left upper outer buttocks Lot: [id number] Exp:06/02/2024  Patient tolerated well, no complications were noted  Performed by: Kennyth Lose, CMA

## 2023-04-04 ENCOUNTER — Ambulatory Visit (INDEPENDENT_AMBULATORY_CARE_PROVIDER_SITE_OTHER): Payer: PPO

## 2023-04-04 DIAGNOSIS — E291 Testicular hypofunction: Secondary | ICD-10-CM | POA: Diagnosis not present

## 2023-04-04 DIAGNOSIS — E1159 Type 2 diabetes mellitus with other circulatory complications: Secondary | ICD-10-CM | POA: Diagnosis not present

## 2023-04-04 DIAGNOSIS — E119 Type 2 diabetes mellitus without complications: Secondary | ICD-10-CM | POA: Diagnosis not present

## 2023-04-04 DIAGNOSIS — E1151 Type 2 diabetes mellitus with diabetic peripheral angiopathy without gangrene: Secondary | ICD-10-CM | POA: Diagnosis not present

## 2023-04-04 MED ORDER — TESTOSTERONE CYPIONATE 200 MG/ML IM SOLN
200.0000 mg | Freq: Once | INTRAMUSCULAR | Status: AC
Start: 2023-04-04 — End: 2023-04-04
  Administered 2023-04-04: 200 mg via INTRAMUSCULAR

## 2023-04-04 NOTE — Progress Notes (Signed)
Testosterone IM Injection  Due to Hypogonadism patient is present today for a Testosterone Injection.  Medication: Testosterone Cypionate Dose: 200 mg/1 ml Location: right upper outer buttocks Lot: [id number] Exp:06/02/2024  Patient tolerated well, no complications were noted  Performed by: Kennyth Lose, CMA  Follow up: Keep Nurse Visit

## 2023-04-18 ENCOUNTER — Other Ambulatory Visit: Payer: Self-pay | Admitting: Cardiology

## 2023-04-18 DIAGNOSIS — I10 Essential (primary) hypertension: Secondary | ICD-10-CM

## 2023-04-19 ENCOUNTER — Ambulatory Visit (INDEPENDENT_AMBULATORY_CARE_PROVIDER_SITE_OTHER): Payer: PPO

## 2023-04-19 ENCOUNTER — Other Ambulatory Visit: Payer: Self-pay | Admitting: Urology

## 2023-04-19 DIAGNOSIS — E291 Testicular hypofunction: Secondary | ICD-10-CM | POA: Diagnosis not present

## 2023-04-19 MED ORDER — TESTOSTERONE CYPIONATE 200 MG/ML IM SOLN
200.0000 mg | Freq: Once | INTRAMUSCULAR | Status: AC
Start: 2023-04-19 — End: 2023-04-19
  Administered 2023-04-19: 200 mg via INTRAMUSCULAR

## 2023-04-19 NOTE — Progress Notes (Signed)
IM injection  Medication: TESTOSTERONE Dose: 200MG  Location: LEFT BUTTOCK Lot: [id number] Exp: 06/02/2024  Patient tolerated well, no complications were noted  Performed by: Isaul Landi LPN

## 2023-05-03 ENCOUNTER — Ambulatory Visit (INDEPENDENT_AMBULATORY_CARE_PROVIDER_SITE_OTHER): Payer: PPO

## 2023-05-03 DIAGNOSIS — E291 Testicular hypofunction: Secondary | ICD-10-CM | POA: Diagnosis not present

## 2023-05-03 MED ORDER — TESTOSTERONE CYPIONATE 200 MG/ML IM SOLN
200.0000 mg | Freq: Once | INTRAMUSCULAR | Status: AC
Start: 2023-05-03 — End: 2023-05-03
  Administered 2023-05-03: 200 mg via INTRAMUSCULAR

## 2023-05-03 NOTE — Progress Notes (Signed)
IM injection  Medication: TESTOSTERONE Dose: 200MG  Location: RIGHT BUTTOCK Lot: [id number] Exp: 06/2024  Patient tolerated well, no complications were noted  Performed by: Rateel Beldin LPN

## 2023-05-17 ENCOUNTER — Ambulatory Visit (INDEPENDENT_AMBULATORY_CARE_PROVIDER_SITE_OTHER): Payer: PPO

## 2023-05-17 DIAGNOSIS — E291 Testicular hypofunction: Secondary | ICD-10-CM

## 2023-05-17 MED ORDER — TESTOSTERONE CYPIONATE 200 MG/ML IM SOLN
200.0000 mg | Freq: Once | INTRAMUSCULAR | Status: AC
Start: 2023-05-17 — End: 2023-05-17
  Administered 2023-05-17: 200 mg via INTRAMUSCULAR

## 2023-05-17 NOTE — Progress Notes (Signed)
IM injection  Medication: TESTOSTERONE Dose: 200 MG  Location: LEFT BUTTOCK   Patient tolerated well, no complications were noted  Performed by: Desmund Elman LPN

## 2023-06-04 ENCOUNTER — Other Ambulatory Visit: Payer: Self-pay | Admitting: Internal Medicine

## 2023-06-04 DIAGNOSIS — E1159 Type 2 diabetes mellitus with other circulatory complications: Secondary | ICD-10-CM

## 2023-06-06 ENCOUNTER — Encounter: Payer: Self-pay | Admitting: Pharmacist

## 2023-06-06 NOTE — Progress Notes (Signed)
Pharmacy Quality Measure Review  This patient is appearing on a report for being at risk of failing the adherence measure for diabetes medications this calendar year.   Medication: metformin 500 mg Last fill date: 06/05/23 for 90 day supply  Insurance report was not up to date. No action needed at this time.   Adam Phenix, PharmD PGY-1 Pharmacy Resident

## 2023-06-14 ENCOUNTER — Ambulatory Visit (INDEPENDENT_AMBULATORY_CARE_PROVIDER_SITE_OTHER): Payer: PPO

## 2023-06-14 DIAGNOSIS — E291 Testicular hypofunction: Secondary | ICD-10-CM

## 2023-06-14 MED ORDER — TESTOSTERONE CYPIONATE 200 MG/ML IM SOLN
200.0000 mg | Freq: Once | INTRAMUSCULAR | Status: AC
Start: 2023-06-14 — End: 2023-06-14
  Administered 2023-06-14: 200 mg via INTRAMUSCULAR

## 2023-06-14 NOTE — Progress Notes (Signed)
Testosterone IM Injection  Due to Hypogonadism patient is present today for a Testosterone Injection.  Medication: Testosterone Cypionate Dose: 200 mg Location: right upper outer buttocks Lot: [id number]  Patient tolerated well, no complications were noted  Performed by: Kennyth Lose, CMA  Follow up: Keep nurse visit

## 2023-06-27 ENCOUNTER — Encounter: Payer: Self-pay | Admitting: Pharmacist

## 2023-06-27 ENCOUNTER — Other Ambulatory Visit: Payer: Self-pay | Admitting: Pharmacist

## 2023-06-27 NOTE — Progress Notes (Signed)
Pharmacy Quality Measure Review  This patient is appearing on a report for being at risk of failing the adherence measure for diabetes medications this calendar year.   Medication: metformin 500 mg  Last fill date: 06/05/23 for 90 day supply  Insurance report was not up to date. No action needed at this time.   Catie Eppie Gibson, PharmD, BCACP, CPP Clinical Pharmacist [facility] [phone]

## 2023-06-27 NOTE — Progress Notes (Signed)
Pharmacy Quality Measure Review  This patient is appearing on a report for being at risk of failing the adherence measure for hypertension (ACEi/ARB) medications this calendar year.   Medication: lisinopril 5 mg Last fill date: 01/29/23 for 90 day supply  Attempted to call patient and left voicemail with call back number.  Jarrett Ables, PharmD PGY-1 Pharmacy Resident

## 2023-07-05 ENCOUNTER — Ambulatory Visit: Payer: PPO

## 2023-07-05 DIAGNOSIS — E291 Testicular hypofunction: Secondary | ICD-10-CM | POA: Diagnosis not present

## 2023-07-05 MED ORDER — TESTOSTERONE CYPIONATE 200 MG/ML IM SOLN
200.0000 mg | INTRAMUSCULAR | Status: AC
  Administered 2023-07-05 – 2024-08-26 (×13): 200 mg via INTRAMUSCULAR

## 2023-07-05 NOTE — Progress Notes (Signed)
Testosterone IM Injection  Due to Hypogonadism patient is present today for a Testosterone Injection.  Medication: Testosterone Cypionate Dose: 200 Location: Right upper outer buttocks Lot: [id number] Exp:04/01206  Patient tolerated well, no complications were noted  Performed by: Kennyth Lose, CMA  Follow up: Keep follow up

## 2023-07-06 ENCOUNTER — Encounter: Payer: Self-pay | Admitting: Internal Medicine

## 2023-07-06 ENCOUNTER — Ambulatory Visit: Payer: PPO | Admitting: Internal Medicine

## 2023-07-06 VITALS — BP 108/74 | HR 78 | Resp 16 | Ht 68.0 in | Wt 230.0 lb

## 2023-07-06 DIAGNOSIS — Z7984 Long term (current) use of oral hypoglycemic drugs: Secondary | ICD-10-CM

## 2023-07-06 DIAGNOSIS — Z23 Encounter for immunization: Secondary | ICD-10-CM

## 2023-07-06 DIAGNOSIS — E782 Mixed hyperlipidemia: Secondary | ICD-10-CM | POA: Diagnosis not present

## 2023-07-06 DIAGNOSIS — E1159 Type 2 diabetes mellitus with other circulatory complications: Secondary | ICD-10-CM

## 2023-07-06 DIAGNOSIS — I10 Essential (primary) hypertension: Secondary | ICD-10-CM

## 2023-07-06 MED ORDER — ROSUVASTATIN CALCIUM 40 MG PO TABS: 40.0000 mg | ORAL_TABLET | Freq: Every day | ORAL | 3 refills | Status: AC

## 2023-07-06 NOTE — Progress Notes (Signed)
Established Patient Office Visit  Subjective   Patient ID: Omar [name], male    DOB: July 01, 1946  Age: 77 y.o. MRN: [id number]  Chief Complaint  Patient presents with   Diabetes    Follow up visit    Hypertension   Omar [name] returns to care today for routine follow-up.  He was last evaluated by me on 6/6 for an acute visit with concern of a mass on his right shin.  An ultrasound was ordered and demonstrated findings consistent with a hematoma.  In the interim, he has been evaluated by urology in the [place] for routine follow-up.  There have otherwise been no acute interval events.  Omar [name] reports feeling well today.  He is asymptomatic and has no acute concerns to discuss.  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 Gerilyn Pilgrim interpreted the study-CPAP recommended, but refused by patient   Stroke [facility])    no deficits from stroke, "Didnt know I had one when they say I did".   Past Surgical History:  Procedure Laterality Date   BIOPSY  04/16/2019   Procedure: BIOPSY;  Surgeon: West Bali, MD;  [facility];  Service: Endoscopy;;   CATARACT EXTRACTION W/PHACO Left 04/03/2016   Procedure: CATARACT EXTRACTION PHACO AND INTRAOCULAR LENS PLACEMENT  LEFT EYE CDE=21.76;  Surgeon: Susa Simmonds, MD;  Location: [facility] ORS;  Service: Ophthalmology;  Laterality: Left;   CATARACT EXTRACTION W/PHACO Right 06/19/2016   Procedure: CATARACT EXTRACTION PHACO AND INTRAOCULAR LENS PLACEMENT; CDE:  11.56;  Surgeon: Susa Simmonds, MD;  Location: [facility] OR[facility];  Service: Ophthalmology;  Laterality: Right;   COLONOSCOPY N/A 04/16/2019   Procedure: COLONOSCOPY;  Surgeon: West Bali, MD;  [facility];  Service: Endoscopy;  Laterality: N/A;  8:30am   COLONOSCOPY W/ POLYPECTOMY  05/2008   polypectomy x4-adenomatous; internal hemorrhoids   CORONARY ANGIOPLASTY WITH STENT PLACEMENT  06/04/2012     DES    to [place]   CORONARY STENT PLACEMENT     x 1   DUPUYTREN / PALMAR FASCIOTOMY  2009   rt hand-dsc-went home same day x2   ESOPHAGOGASTRODUODENOSCOPY N/A 04/16/2019   Procedure: ESOPHAGOGASTRODUODENOSCOPY (EGD);  Surgeon: West Bali, MD;  [facility];  Service: Endoscopy;  Laterality: N/A;   LEFT HEART CATHETERIZATION WITH CORONARY ANGIOGRAM N/A 05/28/2012   Procedure: LEFT HEART CATHETERIZATION WITH CORONARY ANGIOGRAM;  Surgeon: Pamella Pert, MD;  Location: [facility] CATH LAB;  Service: Cardiovascular;  Laterality: N/A;   PERCUTANEOUS CORONARY STENT INTERVENTION (PCI-S) N/A 06/04/2012   Procedure: PERCUTANEOUS CORONARY STENT INTERVENTION (PCI-S);  Surgeon: Pamella Pert, MD;  Location: [facility] CATH LAB;  Service: Cardiovascular;  Laterality: N/A;   POLYPECTOMY  04/16/2019   Procedure: POLYPECTOMY;  Surgeon: West Bali, MD;  [facility];  Service: Endoscopy;;   TONSILLECTOMY     TOTAL HIP ARTHROPLASTY Right 06/24/2019   Procedure: TOTAL HIP ARTHROPLASTY;  Surgeon: Teryl Lucy, MD;  Location: [facility];  Service: Orthopedics;  Laterality: Right;   VENTRAL HERNIA REPAIR  2010   umb hernia-[facility]-went home same day   Social History   Tobacco Use   Smoking status: Never   Smokeless tobacco: Never  Vaping Use   Vaping status: Never  Used  Substance Use Topics   Alcohol use: No   Drug use: No   Family History  Problem Relation Age of Onset   Cancer Mother    Brain cancer Mother    Cancer Father    Prostate cancer Father    Breast cancer Daughter    Thyroid cancer Daughter    Coronary artery disease Neg Hx    Colon cancer Neg Hx    Allergies  Allergen Reactions   Empagliflozin    Review of Systems  Constitutional:  Negative for chills and fever.  HENT:  Negative for sore throat.   Respiratory:  Negative for cough and shortness of breath.   Cardiovascular:  Negative for chest pain, palpitations and leg swelling.  Gastrointestinal:  Negative for abdominal pain, blood in stool, constipation, diarrhea, nausea and vomiting.  Genitourinary:  Negative for dysuria and hematuria.  Musculoskeletal:  Negative for myalgias.  Skin:  Negative for itching and rash.  Neurological:  Negative for dizziness and headaches.  Psychiatric/Behavioral:  Negative for depression and suicidal ideas.      Objective:     BP 108/74   Pulse 78   Resp 16   Ht 5\' 8"  (1.727 m)   Wt 230 lb (104.3 kg)   SpO2 94%   BMI 34.97 kg/m  BP Readings from Last 3 Encounters:  07/06/23 108/74  03/08/23 113/74  01/29/23 118/68   Physical Exam Vitals reviewed.  Constitutional:      General: He is not in acute distress.    Appearance: Normal appearance. He is obese. He is not ill-appearing.  HENT:     Head: Normocephalic and atraumatic.     Right Ear: External ear normal.     Left Ear: External ear normal.     Nose: Nose normal. No congestion or rhinorrhea.     Mouth/Throat:     Mouth: Mucous membranes are moist.     Pharynx: Oropharynx is clear.  Eyes:     Extraocular Movements: Extraocular movements intact.     Conjunctiva/sclera: Conjunctivae normal.     Pupils: Pupils are equal, round, and reactive to light.  Cardiovascular:     Rate and Rhythm: Normal rate and regular rhythm.     Pulses: Normal pulses.     Heart sounds:  Murmur heard.  Pulmonary:     Effort: Pulmonary effort is normal.     Breath sounds: Normal breath sounds. No wheezing, rhonchi or rales.  Abdominal:     General: Abdomen is flat. Bowel sounds are normal. There is no distension.     Palpations: Abdomen is soft.     Tenderness: There is no abdominal tenderness.  Musculoskeletal:        General: Deformity (Dupuytren's contracture right hand) present. No swelling. Normal range of motion.     Cervical back: Normal range of motion.  Skin:  General: Skin is warm and dry.     Capillary Refill: Capillary refill takes less than 2 seconds.  Neurological:     General: No focal deficit present.     Mental Status: He is alert and oriented to person, place, and time.     Motor: No weakness.  Psychiatric:        Mood and Affect: Mood normal.        Behavior: Behavior normal.        Thought Content: Thought content normal.   Last CBC Lab Results  Component Value Date   WBC 8.2 12/29/2022   HGB 17.0 12/29/2022   HCT 49.8 12/29/2022   MCV 89 12/29/2022   MCH 30.2 12/29/2022   RDW 14.3 12/29/2022   PLT 255 12/29/2022   Last metabolic panel Lab Results  Component Value Date   GLUCOSE 114 (H) 12/29/2022   NA 138 12/29/2022   K 4.8 12/29/2022   CL 100 12/29/2022   CO2 23 12/29/2022   BUN 14 12/29/2022   CREATININE 1.13 12/29/2022   EGFR 67 12/29/2022   CALCIUM 10.1 12/29/2022   PHOS 2.9 08/07/2017   PROT 6.2 12/29/2022   ALBUMIN 4.0 12/29/2022   LABGLOB 2.2 12/29/2022   AGRATIO 1.8 12/29/2022   BILITOT 0.4 12/29/2022   ALKPHOS 68 12/29/2022   AST 17 12/29/2022   ALT 15 12/29/2022   ANIONGAP 8 05/21/2022   Last lipids Lab Results  Component Value Date   CHOL 159 12/29/2022   HDL 42 12/29/2022   LDLCALC 85 12/29/2022   TRIG 187 (H) 12/29/2022   CHOLHDL 3.8 12/29/2022   Last hemoglobin A1c Lab Results  Component Value Date   HGBA1C 7.6 (H) 12/29/2022   Last thyroid functions Lab Results  Component Value Date   TSH  2.210 12/29/2022   T4TOTAL 8.1 10/08/2019   Last vitamin D Lab Results  Component Value Date   VD25OH 40.8 12/29/2022   Last vitamin B12 and Folate Lab Results  Component Value Date   VITAMINB12 466 12/29/2022   FOLATE 18.5 12/29/2022     Assessment & Plan:   Problem List Items Addressed This Visit       Essential hypertension    Remains adequately controlled on lisinopril 5 mg daily, spironolactone 50 mg daily, ACE butyl all 200 mg twice daily, and verapamil 240 mg nightly.  No medication changes are indicated today.      Type 2 diabetes mellitus with vascular disease (HCC)    He is currently prescribed Ozempic 2 mg weekly and metformin 500 mg twice daily.  This is largely managed by the [place].  Reports that his last A1c was 7.2.  No additional medication changes have been made today.      Mixed hyperlipidemia    Lipid panel updated in March.  Total cholesterol 159 and LDL 85.  Rosuvastatin was increased to 40 mg daily as a result.  No additional changes have been made today.      Need for influenza vaccination    Influenza vaccine administered today      Return in about 6 months (around 01/04/2024).   Billie Lade, MD

## 2023-07-06 NOTE — Patient Instructions (Addendum)
It was a pleasure to see you today.  Thank you for giving [place] the opportunity to be involved in your care.  Below is a brief recap of your visit and next steps.  We will plan to see you again in 6 months.  Summary No medication changes today We will plan for follow up in 6 months Flu shot today

## 2023-07-07 ENCOUNTER — Other Ambulatory Visit: Payer: Self-pay | Admitting: Cardiology

## 2023-07-07 ENCOUNTER — Other Ambulatory Visit: Payer: Self-pay | Admitting: Internal Medicine

## 2023-07-07 DIAGNOSIS — I10 Essential (primary) hypertension: Secondary | ICD-10-CM

## 2023-07-12 ENCOUNTER — Ambulatory Visit: Payer: PPO

## 2023-07-19 ENCOUNTER — Ambulatory Visit: Payer: PPO

## 2023-07-19 DIAGNOSIS — E291 Testicular hypofunction: Secondary | ICD-10-CM

## 2023-07-19 MED ORDER — TESTOSTERONE CYPIONATE 200 MG/ML IM SOLN
200.0000 mg | Freq: Once | INTRAMUSCULAR | Status: AC
Start: 2023-07-19 — End: 2023-07-19
  Administered 2023-07-19: 200 mg via INTRAMUSCULAR

## 2023-07-19 NOTE — Progress Notes (Signed)
IM injection  Medication: Testosterone Dose: 100mg  Location: right Lot: [id number]  Exp: 12/2024  Patient tolerated well, no complications were noted  Performed by: Guss Bunde, CMA

## 2023-07-22 DIAGNOSIS — Z23 Encounter for immunization: Secondary | ICD-10-CM | POA: Insufficient documentation

## 2023-07-22 NOTE — Assessment & Plan Note (Signed)
Lipid panel updated in March.  Total cholesterol 159 and LDL 85.  Rosuvastatin was increased to 40 mg daily as a result.  No additional changes have been made today.

## 2023-07-22 NOTE — Assessment & Plan Note (Addendum)
Remains adequately controlled on lisinopril 5 mg daily, spironolactone 50 mg daily, ACE butyl all 200 mg twice daily, and verapamil 240 mg nightly.  No medication changes are indicated today.

## 2023-07-22 NOTE — Assessment & Plan Note (Signed)
Influenza vaccine administered today.

## 2023-07-22 NOTE — Assessment & Plan Note (Signed)
He is currently prescribed Ozempic 2 mg weekly and metformin 500 mg twice daily.  This is largely managed by the [place].  Reports that his last A1c was 7.2.  No additional medication changes have been made today.

## 2023-08-01 ENCOUNTER — Other Ambulatory Visit: Payer: Self-pay | Admitting: Internal Medicine

## 2023-08-01 DIAGNOSIS — I10 Essential (primary) hypertension: Secondary | ICD-10-CM

## 2023-08-02 ENCOUNTER — Ambulatory Visit: Payer: PPO

## 2023-08-02 DIAGNOSIS — E291 Testicular hypofunction: Secondary | ICD-10-CM | POA: Diagnosis not present

## 2023-08-02 MED ORDER — TESTOSTERONE CYPIONATE 200 MG/ML IM SOLN
200.0000 mg | Freq: Once | INTRAMUSCULAR | Status: AC
  Administered 2023-08-02: 200 mg via INTRAMUSCULAR

## 2023-08-02 NOTE — Progress Notes (Signed)
IM injection  Medication: TESOSTERONE Dose: 100 MG Location: RIGHT OUTER THIGH Patient tolerated well, no complications were noted  Performed by: Shemika Robbs LPN

## 2023-08-15 ENCOUNTER — Ambulatory Visit: Payer: PPO

## 2023-08-15 ENCOUNTER — Other Ambulatory Visit: Payer: Self-pay | Admitting: Cardiology

## 2023-08-15 DIAGNOSIS — I25118 Atherosclerotic heart disease of native coronary artery with other forms of angina pectoris: Secondary | ICD-10-CM

## 2023-08-17 ENCOUNTER — Ambulatory Visit (INDEPENDENT_AMBULATORY_CARE_PROVIDER_SITE_OTHER): Payer: PPO

## 2023-08-17 DIAGNOSIS — E291 Testicular hypofunction: Secondary | ICD-10-CM

## 2023-08-17 MED ORDER — TESTOSTERONE CYPIONATE 200 MG/ML IM SOLN
200.0000 mg | Freq: Once | INTRAMUSCULAR | Status: AC
  Administered 2023-08-17: 200 mg via INTRAMUSCULAR

## 2023-08-17 NOTE — Progress Notes (Signed)
Testosterone IM Injection  Due to Hypogonadism patient is present today for a Testosterone Injection.  Medication: Testosterone Cypionate Dose: 100mg  Location: left upper outer buttocks Lot: [id number] Exp:12/2024  Patient tolerated well, no complications were noted  Performed by: Guss Bunde, CMA  Follow up: as scheduled

## 2023-08-27 ENCOUNTER — Other Ambulatory Visit: Payer: Self-pay | Admitting: Cardiology

## 2023-08-27 DIAGNOSIS — I10 Essential (primary) hypertension: Secondary | ICD-10-CM

## 2023-09-04 ENCOUNTER — Other Ambulatory Visit: Payer: Self-pay

## 2023-09-04 DIAGNOSIS — E1159 Type 2 diabetes mellitus with other circulatory complications: Secondary | ICD-10-CM

## 2023-09-04 MED ORDER — METFORMIN HCL 500 MG PO TABS: 500.0000 mg | ORAL_TABLET | Freq: Two times a day (BID) | ORAL | 3 refills | Status: DC

## 2023-09-04 NOTE — Addendum Note (Signed)
Addended by: Billie Lade on: 09/04/2023 05:38 PM   Modules accepted: Orders

## 2023-09-04 NOTE — Progress Notes (Signed)
This patient is appearing on a report for being at risk of failing the adherence measure for diabetes medications this calendar year.   Medication: metformin 500 mg BID Last fill date: 06/05/23 for 90 day supply  Patient states that he has ~1/2 bottle left. Denies missed doses. Noted no refills remaining at pharmacy - Will collaborate with provider to facilitate refill needs.  Nils Pyle, PharmD PGY1 Pharmacy Resident

## 2023-09-07 ENCOUNTER — Ambulatory Visit (INDEPENDENT_AMBULATORY_CARE_PROVIDER_SITE_OTHER): Payer: PPO

## 2023-09-07 DIAGNOSIS — E291 Testicular hypofunction: Secondary | ICD-10-CM | POA: Diagnosis not present

## 2023-09-07 MED ORDER — TESTOSTERONE CYPIONATE 200 MG/ML IM SOLN: 200.0000 mg | Freq: Once | INTRAMUSCULAR | Status: DC

## 2023-09-07 NOTE — Addendum Note (Signed)
Addended by: Sarajane Jews on: 09/07/2023 09:18 AM   Modules accepted: Orders, Level of Service

## 2023-09-07 NOTE — Progress Notes (Addendum)
IM injection  Medication: testosterone Dose: 100mg  Location: Left  Exp: 12/31/2024  Patient tolerated well, no complications were noted  Performed by: Gwendolyn Grant CMA & ZOXWRUEA CMA

## 2023-09-21 ENCOUNTER — Ambulatory Visit: Payer: PPO

## 2023-10-01 ENCOUNTER — Ambulatory Visit (INDEPENDENT_AMBULATORY_CARE_PROVIDER_SITE_OTHER): Payer: PPO

## 2023-10-01 DIAGNOSIS — E291 Testicular hypofunction: Secondary | ICD-10-CM | POA: Diagnosis not present

## 2023-10-01 NOTE — Progress Notes (Signed)
Testosterone IM Injection  Due to Hypogonadism patient is present today for a Testosterone Injection.  Medication: Testosterone Cypionate Dose: 100mg  Location: left upper outer buttocks Lot: [id number] Exp:12/31/2024  Patient tolerated well, no complications were noted  Performed by: Gwendolyn Grant CMA  Follow up: As scheduled

## 2023-10-22 ENCOUNTER — Ambulatory Visit: Payer: PPO | Admitting: Cardiology

## 2023-10-22 NOTE — Progress Notes (Signed)
History of Present Illness: Omar [name] returns for f/u of low testosterone. He receives 200 mg IM q 2 weeks here in our office.  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 Gerilyn Pilgrim interpreted the study-CPAP recommended, but refused by patient   Stroke [facility])    no deficits from stroke, "Didnt know I had one when they say I did".    Past Surgical History:  Procedure Laterality Date   BIOPSY  04/16/2019   Procedure: BIOPSY;  Surgeon: West Bali, MD;  [facility];  Service: Endoscopy;;   CATARACT EXTRACTION W/PHACO Left 04/03/2016   Procedure: CATARACT EXTRACTION PHACO AND INTRAOCULAR LENS PLACEMENT LEFT EYE CDE=21.76;  Surgeon: Susa Simmonds, MD;  Location: [facility];  Service: Ophthalmology;  Laterality: Left;   CATARACT EXTRACTION W/PHACO Right 06/19/2016   Procedure: CATARACT EXTRACTION PHACO AND INTRAOCULAR LENS PLACEMENT; CDE:  11.56;  Surgeon: Susa Simmonds, MD;  Location: [facility];  Service: Ophthalmology;  Laterality: Right;   COLONOSCOPY N/A 04/16/2019   Procedure: COLONOSCOPY;  Surgeon: West Bali, MD;  [facility];  Service: Endoscopy;  Laterality: N/A;  8:30am   COLONOSCOPY W/  POLYPECTOMY  05/2008   polypectomy x4-adenomatous; internal hemorrhoids   CORONARY ANGIOPLASTY WITH STENT PLACEMENT  06/04/2012     DES    to [place]   CORONARY STENT PLACEMENT     x 1   DUPUYTREN / PALMAR FASCIOTOMY  2009   rt hand-dsc-went home same day x2   ESOPHAGOGASTRODUODENOSCOPY N/A 04/16/2019   Procedure: ESOPHAGOGASTRODUODENOSCOPY (EGD);  Surgeon: West Bali, MD;  [facility];  Service: Endoscopy;  Laterality: N/A;   LEFT HEART CATHETERIZATION WITH CORONARY ANGIOGRAM N/A 05/28/2012   Procedure: LEFT HEART CATHETERIZATION WITH CORONARY ANGIOGRAM;  Surgeon: Pamella Pert, MD;  Location: [facility] CATH LAB;  Service: Cardiovascular;  Laterality: N/A;   PERCUTANEOUS CORONARY STENT INTERVENTION (PCI-S) N/A 06/04/2012   Procedure: PERCUTANEOUS CORONARY STENT INTERVENTION (PCI-S);  Surgeon: Pamella Pert, MD;  Location: [facility] CATH LAB;  Service: Cardiovascular;  Laterality: N/A;   POLYPECTOMY  04/16/2019   Procedure: POLYPECTOMY;  Surgeon: West Bali, MD;  [facility];  Service: Endoscopy;;   TONSILLECTOMY     TOTAL HIP ARTHROPLASTY Right 06/24/2019   Procedure: TOTAL HIP ARTHROPLASTY;  Surgeon: Teryl Lucy, MD;  Location: [facility];  Service: Orthopedics;  Laterality: Right;   VENTRAL HERNIA REPAIR  2010   umb hernia-[facility]-went home same day    Home Medications:  Allergies as of 10/23/2023       Reactions  Empagliflozin         Medication List        Accurate as of October 22, 2023  9:10 AM. If you have any questions, ask your nurse or doctor.          acebutolol 200 MG capsule Commonly known as: SECTRAL TAKE (1) CAPSULE BY MOUTH TWICE DAILY.   apixaban 5 MG Tabs tablet Commonly known as: ELIQUIS Take 5 mg by mouth 2 (two) times daily.   lisinopril 5 MG tablet Commonly known as: ZESTRIL Take 1 tablet (5 mg total) by mouth daily.   metFORMIN 500 MG tablet Commonly known as: GLUCOPHAGE Take 1 tablet (500 mg total) by mouth 2 (two) times daily  with a meal.   nitroGLYCERIN 0.4 MG SL tablet Commonly known as: NITROSTAT PLACE 1 TAB UNDER TONGUE EVERY 5 MIN IF NEEDED FOR CHEST PAIN. MAY USE 3 TIMES.NO RELIEF CALL 911.   ONE-A-DAY 50 PLUS PO Take 1 tablet by mouth at bedtime.   rosuvastatin 40 MG tablet Commonly known as: CRESTOR Take 1 tablet (40 mg total) by mouth daily.   Semaglutide(0.25 or 0.5MG /DOS) 2 MG/3ML Sopn Inject 1.5 mg into the skin once a week.   spironolactone 50 MG tablet Commonly known as: ALDACTONE TAKE 1 TABLET BY MOUTH EVERY MORNING   testosterone cypionate 200 MG/ML injection Commonly known as: DEPOTESTOSTERONE CYPIONATE INJECT 0.5ML INTO THE MUSCLE EVERY 2 WEEKS.   torsemide 20 MG tablet Commonly known as: DEMADEX Take 20 mg by mouth daily as needed (swelling).   verapamil 240 MG CR tablet Commonly known as: CALAN-SR TAKE ONE TABLET BY MOUTH ONCE DAILY AT BEDTIME        Allergies:  Allergies  Allergen Reactions   Empagliflozin     Family History  Problem Relation Age of Onset   Cancer Mother    Brain cancer Mother    Cancer Father    Prostate cancer Father    Breast cancer Daughter    Thyroid cancer Daughter    Coronary artery disease Neg Hx    Colon cancer Neg Hx     Social History:  reports that he has never smoked. He has never used smokeless tobacco. He reports that he does not drink alcohol and does not use drugs.  ROS: A complete review of systems was performed.  All systems are negative except for pertinent findings as noted.  Physical Exam:  Vital signs in last 24 hours: There were no vitals taken for this visit. Constitutional:  Alert and oriented, No acute distress Cardiovascular: Regular rate  Respiratory: Normal respiratory effort GU: Normal anal sphincter tone.  Prostate 30 g, symmetric, nonnodular nontender Psychiatric: Normal mood and affect  I have reviewed prior pt notes  I have reviewed urinalysis results  I have reviewed prior PSA, testosterone and  Hgb results    Impression/Assessment:  1.  BPH, not on  medical therapy, doing well with minimal urinary symptoms  2.  Low testosterone, on repletion with testosterone cypionate,  200 mg IM every couple of weeks  Plan:  1.  Continue every 2 week testosterone injections  2.  I will see back in 1 year for recheck  3. Labs today--results via my chart

## 2023-10-23 ENCOUNTER — Ambulatory Visit: Payer: PPO | Admitting: Urology

## 2023-10-23 ENCOUNTER — Encounter: Payer: Self-pay | Admitting: Urology

## 2023-10-23 VITALS — BP 105/71 | HR 80

## 2023-10-23 DIAGNOSIS — N5201 Erectile dysfunction due to arterial insufficiency: Secondary | ICD-10-CM

## 2023-10-23 DIAGNOSIS — N401 Enlarged prostate with lower urinary tract symptoms: Secondary | ICD-10-CM

## 2023-10-23 DIAGNOSIS — E291 Testicular hypofunction: Secondary | ICD-10-CM

## 2023-10-24 LAB — TESTOSTERONE: Testosterone: 200 ng/dL — ABNORMAL LOW (ref 264–916)

## 2023-10-24 LAB — PSA: Prostate Specific Ag, Serum: 1.5 ng/mL (ref 0.0–4.0)

## 2023-10-25 ENCOUNTER — Ambulatory Visit: Payer: Self-pay | Admitting: Cardiology

## 2023-10-26 ENCOUNTER — Encounter: Payer: Self-pay | Admitting: Urology

## 2023-11-06 ENCOUNTER — Other Ambulatory Visit: Payer: Self-pay | Admitting: Internal Medicine

## 2023-11-06 DIAGNOSIS — I10 Essential (primary) hypertension: Secondary | ICD-10-CM

## 2023-11-09 ENCOUNTER — Encounter: Payer: Self-pay | Admitting: Cardiology

## 2023-11-09 ENCOUNTER — Other Ambulatory Visit: Payer: Self-pay | Admitting: Urology

## 2023-11-09 ENCOUNTER — Ambulatory Visit: Payer: PPO

## 2023-11-09 DIAGNOSIS — E291 Testicular hypofunction: Secondary | ICD-10-CM

## 2023-11-09 MED ORDER — TESTOSTERONE CYPIONATE 200 MG/ML IM SOLN
200.0000 mg | Freq: Once | INTRAMUSCULAR | Status: AC
  Administered 2023-11-09: 200 mg via INTRAMUSCULAR

## 2023-11-09 NOTE — Progress Notes (Addendum)
 Testosterone  injection  Medication: Testosterone  Cypionate  Dose: 100mg  (.5mL) Location: Right buttock  Lot: [id number] Exp: 12/31/2024  Patient tolerated well, no complications were noted  Performed by: Exie LANES CMA   I supervised the nurse visit, agree with the note.

## 2023-11-12 ENCOUNTER — Other Ambulatory Visit: Payer: Self-pay | Admitting: Urology

## 2023-11-12 DIAGNOSIS — E291 Testicular hypofunction: Secondary | ICD-10-CM

## 2023-11-12 MED ORDER — TESTOSTERONE CYPIONATE 200 MG/ML IM SOLN: INTRAMUSCULAR | 0 refills | Status: DC

## 2023-11-14 NOTE — Telephone Encounter (Signed)
I spoke with patient's wife and confirmed new appointment with her

## 2023-11-15 ENCOUNTER — Ambulatory Visit: Payer: PPO

## 2023-11-23 ENCOUNTER — Ambulatory Visit: Payer: PPO | Admitting: Cardiology

## 2023-11-23 ENCOUNTER — Ambulatory Visit: Payer: PPO

## 2023-11-23 DIAGNOSIS — E291 Testicular hypofunction: Secondary | ICD-10-CM

## 2023-11-23 MED ORDER — TESTOSTERONE CYPIONATE 200 MG/ML IM SOLN
200.0000 mg | Freq: Once | INTRAMUSCULAR | Status: AC
  Administered 2023-11-23: 200 mg via INTRAMUSCULAR

## 2023-11-23 NOTE — Progress Notes (Signed)
Patient receiving Testosterone injection per MD order  The injection site was cleaned and prepped with alcohol. A band aid applied after injection given.   1 ml injection  Medication: Testosterone  Dose: 200/ 1 ml Location: right Buttock Lot: [id number] Exp: [id number]  Patient tolerated well, no complications were noted  Performed by: Kennyth Lose, CMA

## 2023-12-06 ENCOUNTER — Other Ambulatory Visit: Payer: Self-pay | Admitting: Internal Medicine

## 2023-12-06 DIAGNOSIS — I10 Essential (primary) hypertension: Secondary | ICD-10-CM

## 2023-12-07 ENCOUNTER — Encounter: Payer: Self-pay | Admitting: Cardiology

## 2023-12-07 ENCOUNTER — Ambulatory Visit: Payer: PPO

## 2023-12-07 ENCOUNTER — Ambulatory Visit: Payer: PPO | Attending: Cardiology | Admitting: Cardiology

## 2023-12-07 VITALS — BP 112/82 | HR 76 | Resp 18 | Ht 68.0 in | Wt 219.0 lb

## 2023-12-07 DIAGNOSIS — E7800 Pure hypercholesterolemia, unspecified: Secondary | ICD-10-CM

## 2023-12-07 DIAGNOSIS — I25118 Atherosclerotic heart disease of native coronary artery with other forms of angina pectoris: Secondary | ICD-10-CM

## 2023-12-07 DIAGNOSIS — E291 Testicular hypofunction: Secondary | ICD-10-CM

## 2023-12-07 DIAGNOSIS — I10 Essential (primary) hypertension: Secondary | ICD-10-CM

## 2023-12-07 MED ORDER — TESTOSTERONE CYPIONATE 200 MG/ML IM SOLN
200.0000 mg | Freq: Once | INTRAMUSCULAR | Status: AC
  Administered 2023-12-07: 200 mg via INTRAMUSCULAR

## 2023-12-07 NOTE — Patient Instructions (Signed)
 Medication Instructions:  Your physician recommends that you continue on your current medications as directed. Please refer to the Current Medication list given to you today. *If you need a refill on your cardiac medications before your next appointment, please call your pharmacy*   Follow-Up: At [facility], you and your health needs are our priority.  As part of our continuing mission to provide you with exceptional heart care, we have created designated Provider Care Teams.  These Care Teams include your primary Cardiologist (physician) and Advanced Practice Providers (APPs -  Physician Assistants and Nurse Practitioners) who all work together to provide you with the care you need, when you need it.  We recommend signing up for the patient portal called "MyChart".  Sign up information is provided on this After Visit Summary.  MyChart is used to connect with patients for Virtual Visits (Telemedicine).  Patients are able to view lab/test results, encounter notes, upcoming appointments, etc.  Non-urgent messages can be sent to your provider as well.   To learn more about what you can do with MyChart, go to [email or web address].    Your next appointment:   1 year(s)  Provider:   Dr Jacinto Halim

## 2023-12-07 NOTE — Progress Notes (Signed)
 Patient receiving TESTOSTERONE injection per MD order  The injection site was cleaned and prepped with alcohol. A band aid applied after injection given.   IM injection  Medication: TESTOSTERONE Dose: 200MG  Location: right buttock  Patient tolerated well, no complications were noted  Performed by: Daylon Lafavor LPN

## 2023-12-07 NOTE — Progress Notes (Signed)
 Cardiology Office Note:  .   Date:  12/09/2023  ID:  Omar [name], DOB [birth year], MRN [id number] PCP: Yates Decamp, MD  [facility] Providers Cardiologist:  None   History of Present Illness: .   Omar [name] is a 78 y.o. Caucasian male with coronary artery disease, ramus intermediate branch with  Promus DES  on 06/04/2012, DM, hypertension, mixed hyperlipidemia, uncontrolled diabetes mellitus, PAD and current spontaneous DVT x 2 1 after long travel and the second 1 spontaneous and is on chronic anticoagulation with Eliquis.    Discussed the use of AI scribe software for clinical note transcription with the patient, who gave verbal consent to proceed.  History of Present Illness   The patient, with a history of diabetes, presents for a routine follow-up. He has been managing his diabetes with Ozempic, which has also contributed to a significant weight loss of approximately 45-50 pounds over the past year. The patient reports satisfaction with this weight loss, despite needing to adjust his wardrobe frequently.  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 He has two pins inserted in his finger and is using a device called a digit widget to gradually straighten the finger. The patient reports that the device is slowly improving the condition of his finger.  The patient's spouse mentions a recent episode of severe indigestion experienced by the patient. The patient believes it was due to something he ate and does not report frequent episodes of indigestion. He also mentions occasional acid reflux, which he attributes to the Ozempic.      Labs   Lab Results  Component Value Date   CHOL 159 12/29/2022   HDL 42 12/29/2022   LDLCALC 85 12/29/2022   TRIG 187 (H) 12/29/2022   CHOLHDL 3.8 12/29/2022   Lab Results  Component Value Date   NA 138 12/29/2022   K 4.8 12/29/2022   CO2 23 12/29/2022   GLUCOSE 114 (H) 12/29/2022   BUN 14  12/29/2022   CREATININE 1.13 12/29/2022   CALCIUM 10.1 12/29/2022   EGFR 67 12/29/2022   GFRNONAA >60 05/21/2022      Latest Ref Rng & Units 12/29/2022    4:05 PM 06/22/2022    9:22 AM 05/21/2022    3:44 AM  BMP  Glucose 70 - 99 mg/dL 956  213  086   BUN 8 - 27 mg/dL 14  9  20    Creatinine 0.76 - 1.27 mg/dL 5.78  4.69  6.29   BUN/Creat Ratio 10 - 24 12  9     Sodium 134 - 144 mmol/L 138  142  138   Potassium 3.5 - 5.2 mmol/L 4.8  4.3  3.9   Chloride 96 - 106 mmol/L 100  104  107   CO2 20 - 29 mmol/L 23  19  23    Calcium 8.6 - 10.2 mg/dL 52.8  9.1  8.5       Latest Ref Rng & Units 12/29/2022    4:05 PM 05/21/2022    3:44 AM 05/20/2022    4:59 AM  CBC  WBC 3.4 - 10.8 x10E3/uL 8.2  7.5  8.3   Hemoglobin 13.0 - 17.7 g/dL 41.3  24.4  01.0   Hematocrit 37.5 - 51.0 % 49.8  40.7  41.0   Platelets 150 - 450 x10E3/uL 255  195  165    Lab Results  Component Value Date   HGBA1C 7.6 (H) 12/29/2022  Lab Results  Component Value Date   TSH 2.210 12/29/2022    Review of Systems  Cardiovascular:  Negative for chest pain, dyspnea on exertion and leg swelling.   Physical Exam:   VS:  BP 112/82 (BP Location: Left Arm, Patient Position: Sitting, Cuff Size: Normal)   Pulse 76   Resp 18   Ht 5\' 8"  (1.727 m)   Wt 219 lb (99.3 kg)   SpO2 99%   BMI 33.30 kg/m    Wt Readings from Last 3 Encounters:  12/07/23 219 lb (99.3 kg)  07/06/23 230 lb (104.3 kg)  03/08/23 247 lb 3.2 oz (112.1 kg)    Physical Exam Constitutional:      Appearance: He is obese.     Comments: Morbidly obese in no acute distress.  Neck:     Vascular: No carotid bruit or JVD.  Cardiovascular:     Rate and Rhythm: Normal rate and regular rhythm.     Pulses: Intact distal pulses.          Dorsalis pedis pulses are 0 on the right side and 0 on the left side.       Posterior tibial pulses are 0 on the right side and 0 on the left side.     Heart sounds: Normal heart sounds. No murmur heard.    No gallop.   Pulmonary:     Effort: Pulmonary effort is normal.     Breath sounds: Normal breath sounds.  Abdominal:     General: Bowel sounds are normal.     Palpations: Abdomen is soft.     Comments: Obese. Pannus present  Musculoskeletal:     Right lower leg: No edema.     Left lower leg: No edema.    Studies Reviewed: Marland Kitchen    EKG:    EKG Interpretation Date/Time:  Friday December 07 2023 11:58:28 EST Ventricular Rate:  66 PR Interval:  210 QRS Duration:  144 QT Interval:  378 QTC Calculation: 396 R Axis:   245  Text Interpretation: EKG 12/07/2023: Sinus rhythm with first-degree AV block at the rate of 66 bpm, (R block.  Right bundle branch block.  Trifascicular block.  Low-voltage complexes.  No significant change from 10/23/2022. Confirmed by Delrae Rend [phone]) on 12/07/2023 12:09:04 PM    Medications and allergies    Allergies  Allergen Reactions   Empagliflozin      Current Outpatient Medications:    acebutolol (SECTRAL) 200 MG capsule, TAKE (1) CAPSULE BY MOUTH TWICE DAILY., Disp: 180 capsule, Rfl: 0   apixaban (ELIQUIS) 5 MG TABS tablet, Take 5 mg by mouth 2 (two) times daily., Disp: , Rfl:    lisinopril (ZESTRIL) 5 MG tablet, TAKE (1) TABLET BY MOUTH ONCE DAILY., Disp: 90 tablet, Rfl: 3   metFORMIN (GLUCOPHAGE) 500 MG tablet, Take 1 tablet (500 mg total) by mouth 2 (two) times daily with a meal., Disp: 180 tablet, Rfl: 3   Multiple Vitamins-Minerals (ONE-A-DAY 50 PLUS PO), Take 1 tablet by mouth at bedtime. , Disp: , Rfl:    nitroGLYCERIN (NITROSTAT) 0.4 MG SL tablet, PLACE 1 TAB UNDER TONGUE EVERY 5 MIN IF NEEDED FOR CHEST PAIN. MAY USE 3 TIMES.NO RELIEF CALL 911., Disp: 25 tablet, Rfl: 3   rosuvastatin (CRESTOR) 40 MG tablet, Take 1 tablet (40 mg total) by mouth daily., Disp: 90 tablet, Rfl: 3   Semaglutide,0.25 or 0.5MG /DOS, 2 MG/3ML SOPN, Inject 1.5 mg into the skin once a week., Disp: , Rfl:  spironolactone (ALDACTONE) 50 MG tablet, TAKE 1 TABLET BY MOUTH EVERY MORNING,  Disp: 90 tablet, Rfl: 1   testosterone cypionate (DEPOTESTOSTERONE CYPIONATE) 200 MG/ML injection, INJECT 0.5ML INTO THE MUSCLE EVERY 2 WEEKS., Disp: 10 mL, Rfl: 0   torsemide (DEMADEX) 20 MG tablet, Take 20 mg by mouth daily as needed (swelling)., Disp: , Rfl:    verapamil (CALAN-SR) 240 MG CR tablet, TAKE ONE TABLET BY MOUTH ONCE DAILY AT BEDTIME, Disp: 90 tablet, Rfl: 0  Current Facility-Administered Medications:    testosterone cypionate (DEPOTESTOSTERONE CYPIONATE) injection 200 mg, 200 mg, Intramuscular, Weekly, Bjorn Pippin, MD, 200 mg at 10/01/23 1153   ASSESSMENT AND PLAN: .      ICD-10-CM   1. Coronary artery disease of native artery of native heart with stable angina pectoris (HCC)  I25.118 EKG 12-Lead    2. Essential hypertension  I10 EKG 12-Lead    3. Hypercholesteremia  E78.00 EKG 12-Lead      1. Coronary artery disease of native artery of native heart with stable angina pectoris [facility]) Patient remains asymptomatic without angina pectoris, fortunately has not had any recurrence of angina but needed sublingual nitroglycerin use.  He has also been losing weight since being on Ozempic and has noticed overall improvement in wellbeing.  2. Essential hypertension Blood pressure is very well-controlled, on lisinopril 5 mg daily, acebutolol 200 mg twice daily, spironolactone 50 mg daily along with Demadex 20 mg as needed for leg edema and verapamil 240 mg daily at bedtime.  3. Hypercholesteremia I reviewed his lipid profile testing, presently patient is on Crestor 40 mg daily and LDL is >70.  However patient has lost greater than 40 to 50 pounds in weight and suspect his LDL will be at goal to <70 otherwise addition of ezetimibe 10 mg daily would certainly bring his LDL to goal.  Otherwise patient remained stable from cardiac standpoint, no changes in the medications were done today by me, I will see him back on annual basis.      Signed,  Yates Decamp, MD, [facility] 12/09/2023, 3:56  PM [facility] [address] [place], [place] [address] Phone: [phone]. Fax:  [phone]

## 2023-12-14 ENCOUNTER — Other Ambulatory Visit: Payer: Self-pay | Admitting: Cardiology

## 2023-12-14 DIAGNOSIS — I10 Essential (primary) hypertension: Secondary | ICD-10-CM

## 2023-12-21 ENCOUNTER — Ambulatory Visit

## 2024-01-04 ENCOUNTER — Ambulatory Visit: Payer: PPO | Admitting: Internal Medicine

## 2024-01-04 ENCOUNTER — Ambulatory Visit

## 2024-01-04 DIAGNOSIS — E291 Testicular hypofunction: Secondary | ICD-10-CM | POA: Diagnosis not present

## 2024-01-04 MED ORDER — TESTOSTERONE CYPIONATE 200 MG/ML IM SOLN: 200.0000 mg | Freq: Once | INTRAMUSCULAR | Status: DC

## 2024-01-04 NOTE — Progress Notes (Signed)
 Patient receiving Testosterone injection per MD order  The injection site was cleaned and prepped with alcohol. A band aid applied after injection given.   IM injection  Medication: Testosterone Dose: 1mL Location: left buttock Lot: [id number] Exp: 12/31/2024  Patient tolerated well, no complications were noted  Performed by: Alfonse Spruce. CMA

## 2024-01-17 ENCOUNTER — Ambulatory Visit

## 2024-01-17 ENCOUNTER — Ambulatory Visit (INDEPENDENT_AMBULATORY_CARE_PROVIDER_SITE_OTHER)

## 2024-01-17 DIAGNOSIS — E291 Testicular hypofunction: Secondary | ICD-10-CM

## 2024-01-17 MED ORDER — TESTOSTERONE CYPIONATE 200 MG/ML IM SOLN
200.0000 mg | Freq: Once | INTRAMUSCULAR | Status: AC
  Administered 2024-01-17: 200 mg via INTRAMUSCULAR

## 2024-01-17 NOTE — Addendum Note (Signed)
 Addended by: Dorma Gash on: 01/17/2024 02:02 PM   Modules accepted: Orders

## 2024-01-17 NOTE — Progress Notes (Signed)
 Patient receiving testosterone injection per MD order  The injection site was cleaned and prepped with alcohol. A band aid applied after injection given.   IM injection  Medication: Testosterone Dose: 100  Location: right side Lot: [id number] Exp: 12/31/2024  Patient tolerated well, no complications were noted  Performed by: Wyonia Hefty, CMA

## 2024-02-01 ENCOUNTER — Ambulatory Visit (INDEPENDENT_AMBULATORY_CARE_PROVIDER_SITE_OTHER)

## 2024-02-01 DIAGNOSIS — E291 Testicular hypofunction: Secondary | ICD-10-CM | POA: Diagnosis not present

## 2024-02-01 NOTE — Progress Notes (Signed)
 Patient receiving Testosterone  injection per MD order  The injection site was cleaned and prepped with alcohol. A band aid applied after injection given.   IM injection  Medication: Testosterone  Dose: .5mL Location: left ventrogluteal Lot: [id number] Exp: 05/02/2025  Patient tolerated well, no complications were noted  Performed by: Gillian Lacrosse T. CMA

## 2024-02-14 ENCOUNTER — Ambulatory Visit

## 2024-02-18 ENCOUNTER — Encounter: Payer: Self-pay | Admitting: Internal Medicine

## 2024-02-21 ENCOUNTER — Other Ambulatory Visit: Payer: Self-pay | Admitting: Internal Medicine

## 2024-02-21 ENCOUNTER — Ambulatory Visit (INDEPENDENT_AMBULATORY_CARE_PROVIDER_SITE_OTHER)

## 2024-02-21 DIAGNOSIS — I10 Essential (primary) hypertension: Secondary | ICD-10-CM

## 2024-02-21 DIAGNOSIS — E291 Testicular hypofunction: Secondary | ICD-10-CM | POA: Diagnosis not present

## 2024-02-21 NOTE — Progress Notes (Signed)
 Patient receiving testosterone  injection per MD order  The injection site was cleaned and prepped with alcohol. A band aid applied after injection given.   IM injection  Medication: TESTOSTERONE  Dose: 100 mg Location: right buttock  Patient tolerated well, no complications were noted  Performed by: Berlin Viereck LPN

## 2024-02-27 ENCOUNTER — Inpatient Hospital Stay (HOSPITAL_COMMUNITY)
Admission: EM | Admit: 2024-02-27 | Discharge: 2024-03-04 | DRG: 853 | Disposition: A | Discharge status: Home / Self Care | Source: Ambulatory Visit | Attending: Internal Medicine | Admitting: Internal Medicine

## 2024-02-27 ENCOUNTER — Other Ambulatory Visit: Payer: Self-pay

## 2024-02-27 ENCOUNTER — Emergency Department (HOSPITAL_COMMUNITY)

## 2024-02-27 ENCOUNTER — Encounter (HOSPITAL_COMMUNITY): Payer: Self-pay | Admitting: Emergency Medicine

## 2024-02-27 ENCOUNTER — Ambulatory Visit
Admission: EM | Admit: 2024-02-27 | Discharge: 2024-02-27 | Disposition: A | Discharge status: Other Acute Inpatient Hospital | Attending: Nurse Practitioner | Admitting: Nurse Practitioner

## 2024-02-27 DIAGNOSIS — G4733 Obstructive sleep apnea (adult) (pediatric): Secondary | ICD-10-CM | POA: Diagnosis not present

## 2024-02-27 DIAGNOSIS — E876 Hypokalemia: Secondary | ICD-10-CM | POA: Diagnosis present

## 2024-02-27 DIAGNOSIS — E119 Type 2 diabetes mellitus without complications: Secondary | ICD-10-CM | POA: Diagnosis present

## 2024-02-27 DIAGNOSIS — R188 Other ascites: Secondary | ICD-10-CM | POA: Diagnosis not present

## 2024-02-27 DIAGNOSIS — E785 Hyperlipidemia, unspecified: Secondary | ICD-10-CM | POA: Diagnosis present

## 2024-02-27 DIAGNOSIS — B952 Enterococcus as the cause of diseases classified elsewhere: Secondary | ICD-10-CM | POA: Insufficient documentation

## 2024-02-27 DIAGNOSIS — A419 Sepsis, unspecified organism: Secondary | ICD-10-CM | POA: Diagnosis not present

## 2024-02-27 DIAGNOSIS — K8062 Calculus of gallbladder and bile duct with acute cholecystitis without obstruction: Secondary | ICD-10-CM | POA: Diagnosis present

## 2024-02-27 DIAGNOSIS — R6521 Severe sepsis with septic shock: Secondary | ICD-10-CM | POA: Diagnosis not present

## 2024-02-27 DIAGNOSIS — R9431 Abnormal electrocardiogram [ECG] [EKG]: Secondary | ICD-10-CM | POA: Diagnosis not present

## 2024-02-27 DIAGNOSIS — I2510 Atherosclerotic heart disease of native coronary artery without angina pectoris: Secondary | ICD-10-CM | POA: Diagnosis present

## 2024-02-27 DIAGNOSIS — D6851 Activated protein C resistance: Secondary | ICD-10-CM | POA: Diagnosis present

## 2024-02-27 DIAGNOSIS — K8010 Calculus of gallbladder with chronic cholecystitis without obstruction: Secondary | ICD-10-CM | POA: Diagnosis not present

## 2024-02-27 DIAGNOSIS — E66811 Obesity, class 1: Secondary | ICD-10-CM | POA: Diagnosis present

## 2024-02-27 DIAGNOSIS — Z79899 Other long term (current) drug therapy: Secondary | ICD-10-CM

## 2024-02-27 DIAGNOSIS — R1084 Generalized abdominal pain: Secondary | ICD-10-CM | POA: Diagnosis not present

## 2024-02-27 DIAGNOSIS — K8050 Calculus of bile duct without cholangitis or cholecystitis without obstruction: Secondary | ICD-10-CM

## 2024-02-27 DIAGNOSIS — N200 Calculus of kidney: Secondary | ICD-10-CM | POA: Diagnosis not present

## 2024-02-27 DIAGNOSIS — N179 Acute kidney failure, unspecified: Secondary | ICD-10-CM | POA: Diagnosis present

## 2024-02-27 DIAGNOSIS — I10 Essential (primary) hypertension: Secondary | ICD-10-CM | POA: Diagnosis present

## 2024-02-27 DIAGNOSIS — I4510 Unspecified right bundle-branch block: Secondary | ICD-10-CM | POA: Diagnosis not present

## 2024-02-27 DIAGNOSIS — R109 Unspecified abdominal pain: Secondary | ICD-10-CM | POA: Diagnosis not present

## 2024-02-27 DIAGNOSIS — I959 Hypotension, unspecified: Secondary | ICD-10-CM

## 2024-02-27 DIAGNOSIS — Z7901 Long term (current) use of anticoagulants: Secondary | ICD-10-CM

## 2024-02-27 DIAGNOSIS — R0689 Other abnormalities of breathing: Secondary | ICD-10-CM | POA: Diagnosis not present

## 2024-02-27 DIAGNOSIS — K8309 Other cholangitis: Secondary | ICD-10-CM

## 2024-02-27 DIAGNOSIS — R933 Abnormal findings on diagnostic imaging of other parts of digestive tract: Secondary | ICD-10-CM | POA: Diagnosis not present

## 2024-02-27 DIAGNOSIS — Z96641 Presence of right artificial hip joint: Secondary | ICD-10-CM | POA: Diagnosis present

## 2024-02-27 DIAGNOSIS — E860 Dehydration: Secondary | ICD-10-CM | POA: Diagnosis present

## 2024-02-27 DIAGNOSIS — D72829 Elevated white blood cell count, unspecified: Secondary | ICD-10-CM | POA: Diagnosis not present

## 2024-02-27 DIAGNOSIS — E291 Testicular hypofunction: Secondary | ICD-10-CM

## 2024-02-27 DIAGNOSIS — R000 Tachycardia, unspecified: Secondary | ICD-10-CM | POA: Diagnosis not present

## 2024-02-27 DIAGNOSIS — I517 Cardiomegaly: Secondary | ICD-10-CM | POA: Diagnosis not present

## 2024-02-27 DIAGNOSIS — B961 Klebsiella pneumoniae [K. pneumoniae] as the cause of diseases classified elsewhere: Secondary | ICD-10-CM | POA: Insufficient documentation

## 2024-02-27 DIAGNOSIS — R932 Abnormal findings on diagnostic imaging of liver and biliary tract: Secondary | ICD-10-CM | POA: Diagnosis not present

## 2024-02-27 DIAGNOSIS — F40240 Claustrophobia: Secondary | ICD-10-CM | POA: Diagnosis present

## 2024-02-27 DIAGNOSIS — K8020 Calculus of gallbladder without cholecystitis without obstruction: Secondary | ICD-10-CM

## 2024-02-27 DIAGNOSIS — R7989 Other specified abnormal findings of blood chemistry: Secondary | ICD-10-CM

## 2024-02-27 DIAGNOSIS — N400 Enlarged prostate without lower urinary tract symptoms: Secondary | ICD-10-CM | POA: Diagnosis not present

## 2024-02-27 DIAGNOSIS — R7881 Bacteremia: Secondary | ICD-10-CM | POA: Diagnosis not present

## 2024-02-27 DIAGNOSIS — Z8673 Personal history of transient ischemic attack (TIA), and cerebral infarction without residual deficits: Secondary | ICD-10-CM

## 2024-02-27 DIAGNOSIS — Z86718 Personal history of other venous thrombosis and embolism: Secondary | ICD-10-CM

## 2024-02-27 DIAGNOSIS — I700 Atherosclerosis of aorta: Secondary | ICD-10-CM | POA: Diagnosis not present

## 2024-02-27 DIAGNOSIS — K810 Acute cholecystitis: Secondary | ICD-10-CM | POA: Diagnosis not present

## 2024-02-27 DIAGNOSIS — E782 Mixed hyperlipidemia: Secondary | ICD-10-CM | POA: Diagnosis not present

## 2024-02-27 DIAGNOSIS — K8070 Calculus of gallbladder and bile duct without cholecystitis without obstruction: Secondary | ICD-10-CM | POA: Diagnosis not present

## 2024-02-27 DIAGNOSIS — K851 Biliary acute pancreatitis without necrosis or infection: Secondary | ICD-10-CM | POA: Insufficient documentation

## 2024-02-27 DIAGNOSIS — Z7984 Long term (current) use of oral hypoglycemic drugs: Secondary | ICD-10-CM

## 2024-02-27 DIAGNOSIS — R1013 Epigastric pain: Secondary | ICD-10-CM | POA: Diagnosis not present

## 2024-02-27 DIAGNOSIS — Z955 Presence of coronary angioplasty implant and graft: Secondary | ICD-10-CM

## 2024-02-27 DIAGNOSIS — D320 Benign neoplasm of cerebral meninges: Secondary | ICD-10-CM | POA: Diagnosis present

## 2024-02-27 DIAGNOSIS — A4159 Other Gram-negative sepsis: Secondary | ICD-10-CM | POA: Diagnosis present

## 2024-02-27 DIAGNOSIS — A4181 Sepsis due to Enterococcus: Secondary | ICD-10-CM | POA: Diagnosis present

## 2024-02-27 DIAGNOSIS — K3189 Other diseases of stomach and duodenum: Secondary | ICD-10-CM | POA: Diagnosis not present

## 2024-02-27 DIAGNOSIS — K5710 Diverticulosis of small intestine without perforation or abscess without bleeding: Secondary | ICD-10-CM | POA: Diagnosis not present

## 2024-02-27 DIAGNOSIS — D696 Thrombocytopenia, unspecified: Secondary | ICD-10-CM | POA: Diagnosis present

## 2024-02-27 DIAGNOSIS — K828 Other specified diseases of gallbladder: Secondary | ICD-10-CM | POA: Diagnosis not present

## 2024-02-27 DIAGNOSIS — D6959 Other secondary thrombocytopenia: Secondary | ICD-10-CM | POA: Diagnosis present

## 2024-02-27 DIAGNOSIS — I9589 Other hypotension: Secondary | ICD-10-CM | POA: Diagnosis not present

## 2024-02-27 DIAGNOSIS — K838 Other specified diseases of biliary tract: Secondary | ICD-10-CM | POA: Diagnosis not present

## 2024-02-27 DIAGNOSIS — E872 Acidosis, unspecified: Secondary | ICD-10-CM | POA: Diagnosis present

## 2024-02-27 DIAGNOSIS — B9620 Unspecified Escherichia coli [E. coli] as the cause of diseases classified elsewhere: Secondary | ICD-10-CM | POA: Insufficient documentation

## 2024-02-27 DIAGNOSIS — Z803 Family history of malignant neoplasm of breast: Secondary | ICD-10-CM

## 2024-02-27 DIAGNOSIS — Z452 Encounter for adjustment and management of vascular access device: Secondary | ICD-10-CM | POA: Diagnosis not present

## 2024-02-27 DIAGNOSIS — K760 Fatty (change of) liver, not elsewhere classified: Secondary | ICD-10-CM | POA: Diagnosis present

## 2024-02-27 DIAGNOSIS — Z808 Family history of malignant neoplasm of other organs or systems: Secondary | ICD-10-CM

## 2024-02-27 DIAGNOSIS — R1011 Right upper quadrant pain: Secondary | ICD-10-CM

## 2024-02-27 DIAGNOSIS — Z8042 Family history of malignant neoplasm of prostate: Secondary | ICD-10-CM

## 2024-02-27 DIAGNOSIS — K2980 Duodenitis without bleeding: Secondary | ICD-10-CM | POA: Diagnosis present

## 2024-02-27 DIAGNOSIS — K859 Acute pancreatitis without necrosis or infection, unspecified: Secondary | ICD-10-CM | POA: Diagnosis not present

## 2024-02-27 DIAGNOSIS — A4151 Sepsis due to Escherichia coli [E. coli]: Principal | ICD-10-CM | POA: Diagnosis present

## 2024-02-27 DIAGNOSIS — Z7985 Long-term (current) use of injectable non-insulin antidiabetic drugs: Secondary | ICD-10-CM

## 2024-02-27 DIAGNOSIS — I499 Cardiac arrhythmia, unspecified: Secondary | ICD-10-CM | POA: Diagnosis not present

## 2024-02-27 DIAGNOSIS — Z6832 Body mass index (BMI) 32.0-32.9, adult: Secondary | ICD-10-CM

## 2024-02-27 DIAGNOSIS — E8729 Other acidosis: Secondary | ICD-10-CM | POA: Diagnosis not present

## 2024-02-27 DIAGNOSIS — Z860100 Personal history of colon polyps, unspecified: Secondary | ICD-10-CM | POA: Diagnosis not present

## 2024-02-27 LAB — CBC WITH DIFFERENTIAL/PLATELET
Abs Immature Granulocytes: 0.09 10*3/uL — ABNORMAL HIGH (ref 0.00–0.07)
Basophils Absolute: 0 10*3/uL (ref 0.0–0.1)
Basophils Relative: 0 %
Eosinophils Absolute: 0 10*3/uL (ref 0.0–0.5)
Eosinophils Relative: 0 %
HCT: 51.4 % (ref 39.0–52.0)
Hemoglobin: 17 g/dL (ref 13.0–17.0)
Immature Granulocytes: 1 %
Lymphocytes Relative: 2 %
Lymphs Abs: 0.4 10*3/uL — ABNORMAL LOW (ref 0.7–4.0)
MCH: 30.1 pg (ref 26.0–34.0)
MCHC: 33.1 g/dL (ref 30.0–36.0)
MCV: 91 fL (ref 80.0–100.0)
Monocytes Absolute: 0.3 10*3/uL (ref 0.1–1.0)
Monocytes Relative: 2 %
Neutro Abs: 18.5 10*3/uL — ABNORMAL HIGH (ref 1.7–7.7)
Neutrophils Relative %: 95 %
Platelets: 318 10*3/uL (ref 150–400)
RBC: 5.65 MIL/uL (ref 4.22–5.81)
RDW: 15.2 % (ref 11.5–15.5)
WBC: 19.3 10*3/uL — ABNORMAL HIGH (ref 4.0–10.5)
nRBC: 0 % (ref 0.0–0.2)

## 2024-02-27 LAB — I-STAT CG4 LACTIC ACID, ED
Lactic Acid, Venous: 9 mmol/L (ref 0.5–1.9)
Lactic Acid, Venous: 6.6 mmol/L (ref 0.5–1.9)

## 2024-02-27 MED ORDER — ONDANSETRON HCL 4 MG/2ML IJ SOLN
INTRAMUSCULAR | Status: AC
  Administered 2024-02-27: 4 mg via INTRAVENOUS
  Filled 2024-02-27: qty 2

## 2024-02-27 MED ORDER — SODIUM CHLORIDE 0.9 % IV SOLN: Freq: Once | INTRAVENOUS | Status: AC

## 2024-02-27 MED ORDER — MORPHINE SULFATE (PF) 4 MG/ML IV SOLN
4.0000 mg | Freq: Once | INTRAVENOUS | Status: AC
  Administered 2024-02-27: 4 mg via INTRAVENOUS
  Filled 2024-02-27: qty 1

## 2024-02-27 MED ORDER — SODIUM CHLORIDE 0.9 % IV BOLUS
500.0000 mL | Freq: Once | INTRAVENOUS | Status: AC
Start: 2024-02-27 — End: 2024-02-27
  Administered 2024-02-27: 500 mL via INTRAVENOUS

## 2024-02-27 MED ORDER — SODIUM CHLORIDE 0.9 % IV BOLUS
500.0000 mL | Freq: Once | INTRAVENOUS | Status: AC
  Administered 2024-02-27: 500 mL via INTRAVENOUS

## 2024-02-27 MED ORDER — ONDANSETRON HCL 4 MG/2ML IJ SOLN: 4.0000 mg | Freq: Once | INTRAMUSCULAR | Status: AC

## 2024-02-27 MED ORDER — METRONIDAZOLE 500 MG/100ML IV SOLN
500.0000 mg | Freq: Once | INTRAVENOUS | Status: AC
  Administered 2024-02-27: 500 mg via INTRAVENOUS
  Filled 2024-02-27: qty 100

## 2024-02-27 MED ORDER — LACTATED RINGERS IV BOLUS (SEPSIS)
2000.0000 mL | Freq: Once | INTRAVENOUS | Status: AC
  Administered 2024-02-27: 2000 mL via INTRAVENOUS

## 2024-02-27 MED ORDER — SODIUM CHLORIDE 0.9 % IV SOLN
2.0000 g | Freq: Once | INTRAVENOUS | Status: AC
  Administered 2024-02-27: 2 g via INTRAVENOUS
  Filled 2024-02-27: qty 12.5

## 2024-02-27 MED ORDER — LACTATED RINGERS IV SOLN: INTRAVENOUS | Status: AC

## 2024-02-27 MED ORDER — IOHEXOL 350 MG/ML SOLN
100.0000 mL | Freq: Once | INTRAVENOUS | Status: AC | PRN
  Administered 2024-02-27: 100 mL via INTRAVENOUS

## 2024-02-27 NOTE — ED Provider Notes (Signed)
 [place] EMERGENCY DEPARTMENT AT [facility] Provider Note   CSN: [id number] Arrival date & time: 02/27/24  2027     History  Chief Complaint  Patient presents with   Abdominal Pain    Omar [name] is a 78 y.o. male.  HPI   78 year old male presents emergency department by EMS from urgent care for concern of abdominal pain, distention.  Concern for ST elevations on prearrival EKG.  Patient states that he has been having abdominal pain for the past couple days, worse today.  It is mid abdomen.  Does not radiate to his back or chest.  He has had mild nausea but no vomiting.  Had a bowel movement today that was normal, still passing gas.  Denies any history of aneurysms in the abdomen.  Denies any fever or genitourinary symptoms.  At this time no chest pain or shortness of breath but is pale appearing.  Home Medications Prior to Admission medications   Medication Sig Start Date End Date Taking? Authorizing Provider  acebutolol  (SECTRAL ) 200 MG capsule Take 1 capsule (200 mg total) by mouth 2 (two) times daily. 12/14/23   Knox Perl, MD  apixaban  (ELIQUIS ) 5 MG TABS tablet Take 5 mg by mouth 2 (two) times daily.    [provider]  lisinopril  (ZESTRIL ) 5 MG tablet TAKE (1) TABLET BY MOUTH ONCE DAILY. 12/06/23   Tobi Fortes, MD  metFORMIN  (GLUCOPHAGE ) 500 MG tablet Take 1 tablet (500 mg total) by mouth 2 (two) times daily with a meal. 09/04/23   Tobi Fortes, MD  Multiple Vitamins-Minerals (ONE-A-DAY 50 PLUS PO) Take 1 tablet by mouth at bedtime.     [provider]  nitroGLYCERIN  (NITROSTAT ) 0.4 MG SL tablet PLACE 1 TAB UNDER TONGUE EVERY 5 MIN IF NEEDED FOR CHEST PAIN. MAY USE 3 TIMES.NO RELIEF CALL 911. 08/15/23   Knox Perl, MD  rosuvastatin  (CRESTOR ) 40 MG tablet Take 1 tablet (40 mg total) by mouth daily. 07/06/23   Tobi Fortes, MD  Semaglutide,0.25 or 0.5MG /DOS, 2 MG/3ML SOPN Inject 1.5 mg into the skin once a week. 08/18/22   [provider]  spironolactone  (ALDACTONE ) 50 MG tablet TAKE 1 TABLET BY MOUTH EVERY MORNING 08/01/23   Dixon, Phillip E, MD  testosterone  cypionate (DEPOTESTOSTERONE CYPIONATE) 200 MG/ML injection INJECT 0.5ML INTO THE MUSCLE EVERY 2 WEEKS. 11/12/23   Trent Frizzle, MD  torsemide  (DEMADEX ) 20 MG tablet Take 20 mg by mouth daily as needed (swelling). 12/19/16   [provider]  verapamil  (CALAN -SR) 240 MG CR tablet TAKE ONE TABLET BY MOUTH ONCE DAILY AT BEDTIME 11/06/23   Tobi Fortes, MD      Allergies    Empagliflozin     Review of Systems   Review of Systems  Constitutional:  Positive for diaphoresis. Negative for fever.  Respiratory:  Negative for shortness of breath.   Cardiovascular:  Negative for chest pain.  Gastrointestinal:  Positive for abdominal distention, abdominal pain and nausea. Negative for blood in stool, diarrhea and vomiting.  Genitourinary:  Negative for flank pain.  Musculoskeletal:  Negative for back pain.  Skin:  Negative for rash.  Neurological:  Negative for headaches.    Physical Exam Updated Vital Signs BP (!) 61/47   Pulse (!) 105   Temp 97.6 F (36.4 C) (Oral)   Resp (!) 22   Ht 5\' 8"  (1.727 m)   Wt 97.5 kg   SpO2 (!) 88%   BMI 32.69 kg/m  Physical  Exam Vitals and nursing note reviewed.  Constitutional:      Appearance: Normal appearance. He is ill-appearing and diaphoretic.  HENT:     Head: Normocephalic.     Mouth/Throat:     Mouth: Mucous membranes are moist.  Cardiovascular:     Rate and Rhythm: Normal rate.  Pulmonary:     Effort: Pulmonary effort is normal. No respiratory distress.  Abdominal:     General: Bowel sounds are decreased. There is distension.     Palpations: Abdomen is soft.     Tenderness: There is generalized abdominal tenderness. There is no guarding.     Hernia: No hernia is present.  Skin:    General: Skin is warm.     Coloration: Skin is pale.  Neurological:     Mental Status: He is alert and  oriented to person, place, and time. Mental status is at baseline.  Psychiatric:        Mood and Affect: Mood normal.     ED Results / Procedures / Treatments   Labs (all labs ordered are listed, but only abnormal results are displayed) Labs Reviewed  CULTURE, BLOOD (ROUTINE X 2)  CULTURE, BLOOD (ROUTINE X 2)  CBC WITH DIFFERENTIAL/PLATELET  COMPREHENSIVE METABOLIC PANEL WITH GFR  LIPASE, BLOOD  URINALYSIS, ROUTINE W REFLEX MICROSCOPIC  I-STAT CG4 LACTIC ACID, ED    EKG EKG Interpretation Date/Time:  Wednesday Feb 27 2024 20:31:08 EDT Ventricular Rate:  112 PR Interval:  163 QRS Duration:  155 QT Interval:  368 QTC Calculation: 503 R Axis:   173  Text Interpretation: Sinus tachycardia Probable left atrial enlargement RBBB and LPFB Anterolateral infarct, age indeterminate Baseline wander in lead(s) I III aVL 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  Procedures .Critical Care  Performed by: Flonnie Humphrey, DO Authorized by: Flonnie Humphrey, DO   Critical care provider statement:    Critical care time (minutes):  30   Critical care time was exclusive of:  Separately billable procedures and treating other patients   Critical care was necessary to treat or prevent imminent or life-threatening deterioration of the following conditions:  Sepsis and shock   Critical care was time spent personally by me on the following activities:  Development of treatment plan with patient or surrogate, discussions with consultants, evaluation of patient's response to treatment, examination of patient, ordering and review of laboratory studies, ordering and review of radiographic studies, ordering and performing treatments and interventions, pulse oximetry, re-evaluation of patient's condition and review of old charts   I assumed direction of critical care for this patient from another provider in my specialty: no     Care discussed  with: admitting provider       Medications Ordered in ED Medications  morphine (PF) 4 MG/ML injection 4 mg (has no administration in time range)  sodium chloride  0.9 % bolus 500 mL (has no administration in time range)  sodium chloride  0.9 % bolus 500 mL (500 mLs Intravenous New Bag/Given 02/27/24 2106)  ondansetron  (ZOFRAN ) injection 4 mg (4 mg Intravenous Given 02/27/24 2104)    ED Course/ Medical Decision Making/ A&P                                 Medical Decision Making Amount and/or Complexity of Data Reviewed Labs: ordered. Radiology: ordered.  Risk Prescription drug management.   78 year old male presents  emergency department with concern for abdominal pain, back pain and abdominal distention.  On arrival patient looks ill, abdomen is distended and tender, he is tachycardic.  He became hypotensive and diaphoretic.  No history of AAA but concern for possible vascular catastrophe, code medical placed and patient taken straight to the CT scanner.  Blood work revealed a leukocytosis of 19 with initial lactic of 9.  CT of the abdomen pelvis shows area of duodenitis as well as inflamed pancreas and cholelithiasis with possible inflamed gallbladder.  Chemistry and lipase hemolyzed and had to be resent.  Code sepsis placed, after fluids, pain medicine antibiotics patient is feeling better but still tender.  Continues to be tachycardic but blood pressure is stabilizing.  We are pending chemistry labs for possible acute consult but otherwise patient will require medical admission for sepsis, antibiotics.  Patient signed out pending lab evaluation.  Family updated.        Final Clinical Impression(s) / ED Diagnoses Final diagnoses:  None    Rx / DC Orders ED Discharge Orders     None         Flonnie Humphrey, DO 02/27/24 2338

## 2024-02-27 NOTE — ED Triage Notes (Addendum)
 Pt BIB [facility] from UC with c/o abdominal pain with abdominal distension, pale & diaphoretic. Initially called a code stemi, cancelled en route here.  Initial BP 76/52 -> 139/82 98HR 97RA  300cc NS

## 2024-02-27 NOTE — ED Notes (Signed)
 MD made aware of recent hypotension. Increased rate and volume of fluid administration per MD verbal order

## 2024-02-27 NOTE — ED Triage Notes (Signed)
 Pt reports he has some mid abdominal pain, constant burping, vomited twice, and low BP (108/48 in right arm).  States he only ate a small bag of pork skins and a sun drop

## 2024-02-27 NOTE — ED Notes (Signed)
 Report called to [facility] ED, spoke with Maci, RN.

## 2024-02-27 NOTE — Progress Notes (Signed)
 Elink monitoring for the code sepsis protocol.  1610  Notified bedside nurse of need to draw repeat lactic acid. Will attempt as soon as able.

## 2024-02-27 NOTE — ED Notes (Addendum)
 Patient is being discharged from the Urgent Care and sent to the Emergency Department via EMS . Per NP, patient is in need of higher level of care due to hypotension, diaphoretic, pale. Patient is aware and verbalizes understanding of plan of care.  Vitals:   02/27/24 1818  BP: 121/80  Pulse: (!) 122  Resp: 20  SpO2: 91%   Pt and pt family was initially going to ED POV, reassessed pt vitals and pt noted to have bp 74/45, HR 106, 02 91%, mildly diaphoretic,pale. NP aware and at bedside. CMA called EMS, pt and pt family aware of care plan change. Verbal order for IV given and placed, NS initiated 500ml/hr. EMS at bedside at time NS initiated.   Once fluids initiated repeat bp 75/55, HR 103, 02 92%.Report called to [facility] ED.

## 2024-02-27 NOTE — ED Notes (Signed)
 Pt unable to urinate x2. Pt offered more water .

## 2024-02-28 ENCOUNTER — Inpatient Hospital Stay (HOSPITAL_COMMUNITY)

## 2024-02-28 DIAGNOSIS — E872 Acidosis, unspecified: Secondary | ICD-10-CM

## 2024-02-28 DIAGNOSIS — E876 Hypokalemia: Secondary | ICD-10-CM | POA: Diagnosis not present

## 2024-02-28 DIAGNOSIS — B961 Klebsiella pneumoniae [K. pneumoniae] as the cause of diseases classified elsewhere: Secondary | ICD-10-CM | POA: Insufficient documentation

## 2024-02-28 DIAGNOSIS — B952 Enterococcus as the cause of diseases classified elsewhere: Secondary | ICD-10-CM | POA: Diagnosis not present

## 2024-02-28 DIAGNOSIS — K851 Biliary acute pancreatitis without necrosis or infection: Secondary | ICD-10-CM | POA: Insufficient documentation

## 2024-02-28 DIAGNOSIS — N179 Acute kidney failure, unspecified: Secondary | ICD-10-CM | POA: Diagnosis not present

## 2024-02-28 DIAGNOSIS — E119 Type 2 diabetes mellitus without complications: Secondary | ICD-10-CM | POA: Diagnosis not present

## 2024-02-28 DIAGNOSIS — K8010 Calculus of gallbladder with chronic cholecystitis without obstruction: Secondary | ICD-10-CM | POA: Diagnosis not present

## 2024-02-28 DIAGNOSIS — Z6832 Body mass index (BMI) 32.0-32.9, adult: Secondary | ICD-10-CM | POA: Diagnosis not present

## 2024-02-28 DIAGNOSIS — N400 Enlarged prostate without lower urinary tract symptoms: Secondary | ICD-10-CM | POA: Diagnosis not present

## 2024-02-28 DIAGNOSIS — K2980 Duodenitis without bleeding: Secondary | ICD-10-CM

## 2024-02-28 DIAGNOSIS — R7401 Elevation of levels of liver transaminase levels: Secondary | ICD-10-CM | POA: Diagnosis not present

## 2024-02-28 DIAGNOSIS — D6851 Activated protein C resistance: Secondary | ICD-10-CM | POA: Diagnosis not present

## 2024-02-28 DIAGNOSIS — R7881 Bacteremia: Secondary | ICD-10-CM | POA: Diagnosis not present

## 2024-02-28 DIAGNOSIS — G4733 Obstructive sleep apnea (adult) (pediatric): Secondary | ICD-10-CM | POA: Diagnosis not present

## 2024-02-28 DIAGNOSIS — K760 Fatty (change of) liver, not elsewhere classified: Secondary | ICD-10-CM | POA: Diagnosis not present

## 2024-02-28 DIAGNOSIS — R933 Abnormal findings on diagnostic imaging of other parts of digestive tract: Secondary | ICD-10-CM

## 2024-02-28 DIAGNOSIS — E1159 Type 2 diabetes mellitus with other circulatory complications: Secondary | ICD-10-CM | POA: Diagnosis not present

## 2024-02-28 DIAGNOSIS — D72829 Elevated white blood cell count, unspecified: Secondary | ICD-10-CM

## 2024-02-28 DIAGNOSIS — R7989 Other specified abnormal findings of blood chemistry: Secondary | ICD-10-CM | POA: Diagnosis not present

## 2024-02-28 DIAGNOSIS — R000 Tachycardia, unspecified: Secondary | ICD-10-CM | POA: Diagnosis not present

## 2024-02-28 DIAGNOSIS — K8070 Calculus of gallbladder and bile duct without cholecystitis without obstruction: Secondary | ICD-10-CM | POA: Diagnosis not present

## 2024-02-28 DIAGNOSIS — A4159 Other Gram-negative sepsis: Secondary | ICD-10-CM | POA: Diagnosis not present

## 2024-02-28 DIAGNOSIS — A4181 Sepsis due to Enterococcus: Secondary | ICD-10-CM | POA: Diagnosis not present

## 2024-02-28 DIAGNOSIS — K8062 Calculus of gallbladder and bile duct with acute cholecystitis without obstruction: Secondary | ICD-10-CM | POA: Diagnosis not present

## 2024-02-28 DIAGNOSIS — D320 Benign neoplasm of cerebral meninges: Secondary | ICD-10-CM | POA: Diagnosis not present

## 2024-02-28 DIAGNOSIS — K529 Noninfective gastroenteritis and colitis, unspecified: Secondary | ICD-10-CM

## 2024-02-28 DIAGNOSIS — B9620 Unspecified Escherichia coli [E. coli] as the cause of diseases classified elsewhere: Secondary | ICD-10-CM

## 2024-02-28 DIAGNOSIS — R1013 Epigastric pain: Secondary | ICD-10-CM | POA: Diagnosis not present

## 2024-02-28 DIAGNOSIS — E860 Dehydration: Secondary | ICD-10-CM | POA: Diagnosis not present

## 2024-02-28 DIAGNOSIS — K859 Acute pancreatitis without necrosis or infection, unspecified: Secondary | ICD-10-CM | POA: Diagnosis not present

## 2024-02-28 DIAGNOSIS — I2510 Atherosclerotic heart disease of native coronary artery without angina pectoris: Secondary | ICD-10-CM | POA: Diagnosis not present

## 2024-02-28 DIAGNOSIS — Z7901 Long term (current) use of anticoagulants: Secondary | ICD-10-CM | POA: Diagnosis not present

## 2024-02-28 DIAGNOSIS — K3189 Other diseases of stomach and duodenum: Secondary | ICD-10-CM | POA: Diagnosis not present

## 2024-02-28 DIAGNOSIS — K8309 Other cholangitis: Secondary | ICD-10-CM | POA: Diagnosis not present

## 2024-02-28 DIAGNOSIS — N200 Calculus of kidney: Secondary | ICD-10-CM | POA: Diagnosis not present

## 2024-02-28 DIAGNOSIS — Z7985 Long-term (current) use of injectable non-insulin antidiabetic drugs: Secondary | ICD-10-CM | POA: Diagnosis not present

## 2024-02-28 DIAGNOSIS — Z860100 Personal history of colon polyps, unspecified: Secondary | ICD-10-CM

## 2024-02-28 DIAGNOSIS — K5710 Diverticulosis of small intestine without perforation or abscess without bleeding: Secondary | ICD-10-CM | POA: Diagnosis not present

## 2024-02-28 DIAGNOSIS — K810 Acute cholecystitis: Secondary | ICD-10-CM | POA: Diagnosis not present

## 2024-02-28 DIAGNOSIS — K8020 Calculus of gallbladder without cholecystitis without obstruction: Secondary | ICD-10-CM | POA: Diagnosis not present

## 2024-02-28 DIAGNOSIS — D696 Thrombocytopenia, unspecified: Secondary | ICD-10-CM | POA: Diagnosis not present

## 2024-02-28 DIAGNOSIS — E782 Mixed hyperlipidemia: Secondary | ICD-10-CM | POA: Diagnosis not present

## 2024-02-28 DIAGNOSIS — K838 Other specified diseases of biliary tract: Secondary | ICD-10-CM | POA: Diagnosis not present

## 2024-02-28 DIAGNOSIS — Z7984 Long term (current) use of oral hypoglycemic drugs: Secondary | ICD-10-CM | POA: Diagnosis not present

## 2024-02-28 DIAGNOSIS — R1011 Right upper quadrant pain: Secondary | ICD-10-CM

## 2024-02-28 DIAGNOSIS — I9589 Other hypotension: Secondary | ICD-10-CM | POA: Diagnosis not present

## 2024-02-28 DIAGNOSIS — R1084 Generalized abdominal pain: Secondary | ICD-10-CM | POA: Diagnosis not present

## 2024-02-28 DIAGNOSIS — A4151 Sepsis due to Escherichia coli [E. coli]: Secondary | ICD-10-CM | POA: Diagnosis not present

## 2024-02-28 DIAGNOSIS — K8050 Calculus of bile duct without cholangitis or cholecystitis without obstruction: Secondary | ICD-10-CM | POA: Diagnosis not present

## 2024-02-28 DIAGNOSIS — E785 Hyperlipidemia, unspecified: Secondary | ICD-10-CM | POA: Diagnosis not present

## 2024-02-28 DIAGNOSIS — I10 Essential (primary) hypertension: Secondary | ICD-10-CM | POA: Diagnosis not present

## 2024-02-28 DIAGNOSIS — A419 Sepsis, unspecified organism: Principal | ICD-10-CM | POA: Diagnosis present

## 2024-02-28 DIAGNOSIS — R6521 Severe sepsis with septic shock: Secondary | ICD-10-CM | POA: Diagnosis not present

## 2024-02-28 LAB — BLOOD CULTURE ID PANEL (REFLEXED) - BCID2
A.calcoaceticus-baumannii: NOT DETECTED
Bacteroides fragilis: NOT DETECTED
CTX-M ESBL: NOT DETECTED
Candida albicans: NOT DETECTED
Candida auris: NOT DETECTED
Candida glabrata: NOT DETECTED
Candida krusei: NOT DETECTED
Candida parapsilosis: NOT DETECTED
Candida tropicalis: NOT DETECTED
Carbapenem resist OXA 48 LIKE: NOT DETECTED
Carbapenem resistance IMP: NOT DETECTED
Carbapenem resistance KPC: NOT DETECTED
Carbapenem resistance NDM: NOT DETECTED
Carbapenem resistance VIM: NOT DETECTED
Cryptococcus neoformans/gattii: NOT DETECTED
Enterobacter cloacae complex: NOT DETECTED
Enterobacterales: DETECTED — AB
Enterococcus Faecium: NOT DETECTED
Enterococcus faecalis: DETECTED — AB
Escherichia coli: DETECTED — AB
Haemophilus influenzae: NOT DETECTED
Klebsiella aerogenes: NOT DETECTED
Klebsiella oxytoca: NOT DETECTED
Klebsiella pneumoniae: DETECTED — AB
Listeria monocytogenes: NOT DETECTED
Neisseria meningitidis: NOT DETECTED
Proteus species: NOT DETECTED
Pseudomonas aeruginosa: NOT DETECTED
Salmonella species: NOT DETECTED
Serratia marcescens: NOT DETECTED
Staphylococcus aureus (BCID): NOT DETECTED
Staphylococcus epidermidis: NOT DETECTED
Staphylococcus lugdunensis: NOT DETECTED
Staphylococcus species: NOT DETECTED
Stenotrophomonas maltophilia: NOT DETECTED
Streptococcus agalactiae: NOT DETECTED
Streptococcus pneumoniae: NOT DETECTED
Streptococcus pyogenes: NOT DETECTED
Streptococcus species: NOT DETECTED
Vancomycin resistance: NOT DETECTED

## 2024-02-28 LAB — LACTIC ACID, PLASMA
Lactic Acid, Venous: 3.4 mmol/L (ref 0.5–1.9)
Lactic Acid, Venous: 2.6 mmol/L (ref 0.5–1.9)

## 2024-02-28 LAB — COMPREHENSIVE METABOLIC PANEL WITH GFR
ALT: 262 U/L — ABNORMAL HIGH (ref 0–44)
ALT: 249 U/L — ABNORMAL HIGH (ref 0–44)
AST: 329 U/L — ABNORMAL HIGH (ref 15–41)
AST: 257 U/L — ABNORMAL HIGH (ref 15–41)
Albumin: 2.5 g/dL — ABNORMAL LOW (ref 3.5–5.0)
Albumin: 2.4 g/dL — ABNORMAL LOW (ref 3.5–5.0)
Alkaline Phosphatase: 141 U/L — ABNORMAL HIGH (ref 38–126)
Alkaline Phosphatase: 115 U/L (ref 38–126)
Anion gap: 13 (ref 5–15)
Anion gap: 14 (ref 5–15)
BUN: 19 mg/dL (ref 8–23)
BUN: 24 mg/dL — ABNORMAL HIGH (ref 8–23)
CO2: 18 mmol/L — ABNORMAL LOW (ref 22–32)
CO2: 19 mmol/L — ABNORMAL LOW (ref 22–32)
Calcium: 8.3 mg/dL — ABNORMAL LOW (ref 8.9–10.3)
Calcium: 8.6 mg/dL — ABNORMAL LOW (ref 8.9–10.3)
Chloride: 108 mmol/L (ref 98–111)
Chloride: 107 mmol/L (ref 98–111)
Creatinine, Ser: 1.5 mg/dL — ABNORMAL HIGH (ref 0.61–1.24)
Creatinine, Ser: 1.75 mg/dL — ABNORMAL HIGH (ref 0.61–1.24)
GFR, Estimated: 47 mL/min — ABNORMAL LOW (ref >60)
GFR, Estimated: 39 mL/min — ABNORMAL LOW (ref >60)
Glucose, Bld: 157 mg/dL — ABNORMAL HIGH (ref 70–99)
Glucose, Bld: 178 mg/dL — ABNORMAL HIGH (ref 70–99)
Potassium: 3.8 mmol/L (ref 3.5–5.1)
Potassium: 3.4 mmol/L — ABNORMAL LOW (ref 3.5–5.1)
Sodium: 139 mmol/L (ref 135–145)
Sodium: 140 mmol/L (ref 135–145)
Total Bilirubin: 3 mg/dL — ABNORMAL HIGH (ref 0.0–1.2)
Total Bilirubin: 3.1 mg/dL — ABNORMAL HIGH (ref 0.0–1.2)
Total Protein: 4.8 g/dL — ABNORMAL LOW (ref 6.5–8.1)
Total Protein: 4.6 g/dL — ABNORMAL LOW (ref 6.5–8.1)

## 2024-02-28 LAB — URINALYSIS, ROUTINE W REFLEX MICROSCOPIC
Bacteria, UA: NONE SEEN
Bilirubin Urine: NEGATIVE
Glucose, UA: NEGATIVE mg/dL
Ketones, ur: NEGATIVE mg/dL
Leukocytes,Ua: NEGATIVE
Nitrite: NEGATIVE
Protein, ur: 30 mg/dL — AB
RBC / HPF: >50 RBC/hpf (ref 0–5)
Specific Gravity, Urine: >1.046 — ABNORMAL HIGH (ref 1.005–1.030)
pH: 5 (ref 5.0–8.0)

## 2024-02-28 LAB — CBC
HCT: 40.8 % (ref 39.0–52.0)
Hemoglobin: 13.7 g/dL (ref 13.0–17.0)
MCH: 30.4 pg (ref 26.0–34.0)
MCHC: 33.6 g/dL (ref 30.0–36.0)
MCV: 90.5 fL (ref 80.0–100.0)
Platelets: 195 10*3/uL (ref 150–400)
RBC: 4.51 MIL/uL (ref 4.22–5.81)
RDW: 15.4 % (ref 11.5–15.5)
WBC: 26.3 10*3/uL — ABNORMAL HIGH (ref 4.0–10.5)
nRBC: 0 % (ref 0.0–0.2)

## 2024-02-28 LAB — CORTISOL: Cortisol, Plasma: >100 ug/dL

## 2024-02-28 LAB — MAGNESIUM: Magnesium: 1.4 mg/dL — ABNORMAL LOW (ref 1.7–2.4)

## 2024-02-28 LAB — APTT
aPTT: 142 s — ABNORMAL HIGH (ref 24–36)
aPTT: 38 s — ABNORMAL HIGH (ref 24–36)
aPTT: 162 s (ref 24–36)

## 2024-02-28 LAB — ECHOCARDIOGRAM COMPLETE BUBBLE STUDY
AR max vel: 2.06 cm2
AV Area VTI: 1.92 cm2
AV Area mean vel: 1.92 cm2
AV Mean grad: 4 mmHg
AV Peak grad: 8 mmHg
Ao pk vel: 1.41 m/s
Area-P 1/2: 3.66 cm2
MV M vel: 3.34 m/s
MV Peak grad: 44.6 mmHg
S' Lateral: 3 cm

## 2024-02-28 LAB — PROTIME-INR
INR: 1.9 — ABNORMAL HIGH (ref 0.8–1.2)
Prothrombin Time: 22.3 s — ABNORMAL HIGH (ref 11.4–15.2)

## 2024-02-28 LAB — HEPARIN LEVEL (UNFRACTIONATED)
Heparin Unfractionated: 0.53 [IU]/mL (ref 0.30–0.70)
Heparin Unfractionated: >1.1 [IU]/mL — ABNORMAL HIGH (ref 0.30–0.70)

## 2024-02-28 LAB — GLUCOSE, CAPILLARY
Glucose-Capillary: 165 mg/dL — ABNORMAL HIGH (ref 70–99)
Glucose-Capillary: 180 mg/dL — ABNORMAL HIGH (ref 70–99)
Glucose-Capillary: 150 mg/dL — ABNORMAL HIGH (ref 70–99)
Glucose-Capillary: 199 mg/dL — ABNORMAL HIGH (ref 70–99)
Glucose-Capillary: 168 mg/dL — ABNORMAL HIGH (ref 70–99)
Glucose-Capillary: 168 mg/dL — ABNORMAL HIGH (ref 70–99)

## 2024-02-28 LAB — LIPASE, BLOOD
Lipase: 1537 U/L — ABNORMAL HIGH (ref 11–51)
Lipase: 1292 U/L — ABNORMAL HIGH (ref 11–51)

## 2024-02-28 LAB — MRSA NEXT GEN BY PCR, NASAL: MRSA by PCR Next Gen: NOT DETECTED

## 2024-02-28 LAB — HEMOGLOBIN A1C
Hgb A1c MFr Bld: 6.3 % — ABNORMAL HIGH (ref 4.8–5.6)
Mean Plasma Glucose: 134.11 mg/dL

## 2024-02-28 LAB — AMYLASE: Amylase: 1260 U/L — ABNORMAL HIGH (ref 28–100)

## 2024-02-28 MED ORDER — MELATONIN 3 MG PO TABS: 3.0000 mg | ORAL_TABLET | Freq: Every evening | ORAL | Status: DC | PRN

## 2024-02-28 MED ORDER — MAGNESIUM SULFATE 4 GM/100ML IV SOLN
4.0000 g | Freq: Once | INTRAVENOUS | Status: AC
  Administered 2024-02-28: 4 g via INTRAVENOUS
  Filled 2024-02-28: qty 100

## 2024-02-28 MED ORDER — MIDAZOLAM HCL 2 MG/2ML IJ SOLN
1.0000 mg | Freq: Once | INTRAMUSCULAR | Status: AC | PRN
  Administered 2024-02-28: 2 mg via INTRAVENOUS
  Filled 2024-02-28: qty 2

## 2024-02-28 MED ORDER — VANCOMYCIN HCL 1750 MG/350ML IV SOLN
1750.0000 mg | INTRAVENOUS | Status: DC
  Administered 2024-02-28: 1750 mg via INTRAVENOUS
  Filled 2024-02-28: qty 350

## 2024-02-28 MED ORDER — SODIUM CHLORIDE 0.9 % IV SOLN: 2.0000 g | Freq: Two times a day (BID) | INTRAVENOUS | Status: DC

## 2024-02-28 MED ORDER — LACTATED RINGERS IV BOLUS
500.0000 mL | Freq: Once | INTRAVENOUS | Status: AC
  Administered 2024-02-28: 500 mL via INTRAVENOUS

## 2024-02-28 MED ORDER — NOREPINEPHRINE 4 MG/250ML-% IV SOLN: 0.0000 ug/min | INTRAVENOUS | Status: DC

## 2024-02-28 MED ORDER — HYDROCORTISONE SOD SUC (PF) 100 MG IJ SOLR
100.0000 mg | Freq: Two times a day (BID) | INTRAMUSCULAR | Status: DC
  Administered 2024-02-28 (×2): 100 mg via INTRAVENOUS
  Filled 2024-02-28 (×2): qty 2

## 2024-02-28 MED ORDER — SODIUM CHLORIDE 0.9% FLUSH: 10.0000 mL | INTRAVENOUS | Status: DC | PRN

## 2024-02-28 MED ORDER — METRONIDAZOLE 500 MG/100ML IV SOLN: 500.0000 mg | Freq: Two times a day (BID) | INTRAVENOUS | Status: DC

## 2024-02-28 MED ORDER — DOCUSATE SODIUM 100 MG PO CAPS
100.0000 mg | ORAL_CAPSULE | Freq: Two times a day (BID) | ORAL | Status: DC | PRN
Start: 2024-02-28 — End: 2024-03-04

## 2024-02-28 MED ORDER — POTASSIUM CHLORIDE 10 MEQ/50ML IV SOLN
10.0000 meq | INTRAVENOUS | Status: AC
  Administered 2024-02-28 (×4): 10 meq via INTRAVENOUS
  Filled 2024-02-28 (×4): qty 50

## 2024-02-28 MED ORDER — LACTATED RINGERS IV BOLUS: 500.0000 mL | Freq: Once | INTRAVENOUS | Status: AC

## 2024-02-28 MED ORDER — NOREPINEPHRINE 4 MG/250ML-% IV SOLN
0.0000 ug/min | INTRAVENOUS | Status: DC
  Administered 2024-02-28: 12 ug/min via INTRAVENOUS
  Administered 2024-02-28: 16 ug/min via INTRAVENOUS
  Filled 2024-02-28 (×2): qty 250

## 2024-02-28 MED ORDER — VASOPRESSIN 20 UNITS/100 ML INFUSION FOR SHOCK
0.0000 [IU]/min | INTRAVENOUS | Status: DC
  Administered 2024-02-28 (×2): 0.03 [IU]/min via INTRAVENOUS
  Filled 2024-02-28: qty 100

## 2024-02-28 MED ORDER — NOREPINEPHRINE 4 MG/250ML-% IV SOLN
INTRAVENOUS | Status: AC
Start: 2024-02-28 — End: 2024-02-28
  Administered 2024-02-28: 3 ug/min via INTRAVENOUS
  Filled 2024-02-28: qty 250

## 2024-02-28 MED ORDER — HEPARIN (PORCINE) 25000 UT/250ML-% IV SOLN
1500.0000 [IU]/h | INTRAVENOUS | Status: DC
  Administered 2024-02-28: 1500 [IU]/h via INTRAVENOUS
  Filled 2024-02-28: qty 250

## 2024-02-28 MED ORDER — PIPERACILLIN-TAZOBACTAM 3.375 G IVPB: 3.3750 g | Freq: Three times a day (TID) | INTRAVENOUS | Status: DC

## 2024-02-28 MED ORDER — MELATONIN 5 MG PO TABS: 5.0000 mg | ORAL_TABLET | Freq: Once | ORAL | Status: DC

## 2024-02-28 MED ORDER — HEPARIN BOLUS VIA INFUSION
4000.0000 [IU] | Freq: Once | INTRAVENOUS | Status: AC
  Administered 2024-02-28: 4000 [IU] via INTRAVENOUS
  Filled 2024-02-28: qty 4000

## 2024-02-28 MED ORDER — HEPARIN SODIUM (PORCINE) 5000 UNIT/ML IJ SOLN: 5000.0000 [IU] | Freq: Three times a day (TID) | INTRAMUSCULAR | Status: DC

## 2024-02-28 MED ORDER — PIPERACILLIN-TAZOBACTAM 3.375 G IVPB
3.3750 g | Freq: Three times a day (TID) | INTRAVENOUS | Status: DC
  Administered 2024-02-28 – 2024-03-02 (×10): 3.375 g via INTRAVENOUS
  Filled 2024-02-28 (×10): qty 50

## 2024-02-28 MED ORDER — SODIUM CHLORIDE 0.9% FLUSH
10.0000 mL | Freq: Two times a day (BID) | INTRAVENOUS | Status: DC
  Administered 2024-02-28: 10 mL
  Administered 2024-02-28: 30 mL
  Administered 2024-02-29: 10 mL
  Administered 2024-02-29: 40 mL
  Administered 2024-03-01: 10 mL
  Administered 2024-03-01: 40 mL
  Administered 2024-03-02: 30 mL
  Administered 2024-03-02 – 2024-03-04 (×3): 10 mL

## 2024-02-28 MED ORDER — PANTOPRAZOLE SODIUM 40 MG IV SOLR
40.0000 mg | INTRAVENOUS | Status: DC
  Administered 2024-02-28 – 2024-03-04 (×6): 40 mg via INTRAVENOUS
  Filled 2024-02-28 (×6): qty 10

## 2024-02-28 MED ORDER — POLYETHYLENE GLYCOL 3350 17 G PO PACK: 17.0000 g | PACK | Freq: Every day | ORAL | Status: DC | PRN

## 2024-02-28 MED ORDER — SODIUM CHLORIDE 0.9 % IV SOLN: 250.0000 mL | INTRAVENOUS | Status: AC

## 2024-02-28 MED ORDER — GADOBUTROL 1 MMOL/ML IV SOLN
10.0000 mL | Freq: Once | INTRAVENOUS | Status: AC | PRN
  Administered 2024-02-28: 10 mL via INTRAVENOUS

## 2024-02-28 MED ORDER — INSULIN ASPART 100 UNIT/ML IJ SOLN
0.0000 [IU] | INTRAMUSCULAR | Status: DC
  Administered 2024-02-28: 1 [IU] via SUBCUTANEOUS
  Administered 2024-02-28 (×2): 2 [IU] via SUBCUTANEOUS

## 2024-02-28 MED ORDER — INSULIN ASPART 100 UNIT/ML IJ SOLN
0.0000 [IU] | Freq: Three times a day (TID) | INTRAMUSCULAR | Status: DC
  Administered 2024-02-28 (×2): 3 [IU] via SUBCUTANEOUS
  Administered 2024-02-29: 2 [IU] via SUBCUTANEOUS
  Administered 2024-03-01: 3 [IU] via SUBCUTANEOUS
  Administered 2024-03-01: 2 [IU] via SUBCUTANEOUS
  Administered 2024-03-02: 5 [IU] via SUBCUTANEOUS
  Administered 2024-03-03 – 2024-03-04 (×3): 3 [IU] via SUBCUTANEOUS

## 2024-02-28 MED ORDER — CHLORHEXIDINE GLUCONATE CLOTH 2 % EX PADS
6.0000 | MEDICATED_PAD | Freq: Every day | CUTANEOUS | Status: DC
  Administered 2024-02-28 – 2024-03-04 (×6): 6 via TOPICAL

## 2024-02-28 MED ORDER — PIPERACILLIN-TAZOBACTAM 3.375 G IVPB 30 MIN
3.3750 g | Freq: Once | INTRAVENOUS | Status: AC
  Administered 2024-02-28: 3.375 g via INTRAVENOUS
  Filled 2024-02-28: qty 50

## 2024-02-28 MED ORDER — VASOPRESSIN 20 UNITS/100 ML INFUSION FOR SHOCK
INTRAVENOUS | Status: AC
  Filled 2024-02-28: qty 100

## 2024-02-28 MED ORDER — MELATONIN 5 MG PO TABS
10.0000 mg | ORAL_TABLET | Freq: Every evening | ORAL | Status: DC | PRN
  Administered 2024-02-28 – 2024-03-03 (×5): 10 mg via ORAL
  Filled 2024-02-28 (×5): qty 2

## 2024-02-28 MED ORDER — HEPARIN (PORCINE) 25000 UT/250ML-% IV SOLN
1150.0000 [IU]/h | INTRAVENOUS | Status: AC
  Administered 2024-02-28: 1150 [IU]/h via INTRAVENOUS
  Filled 2024-02-28: qty 250

## 2024-02-28 MED ORDER — GADOBUTROL 1 MMOL/ML IV SOLN
9.0000 mL | Freq: Once | INTRAVENOUS | Status: DC | PRN
Start: 2024-02-28 — End: 2024-03-04

## 2024-02-28 NOTE — H&P (Signed)
 NAME:  Omar [name], MRN:  [id number], DOB:  Mar 29, 1946, LOS: 0 ADMISSION DATE:  02/27/2024, CONSULTATION DATE:  02/28/2024 REFERRING MD:  Eldon Greenland, MD, CHIEF COMPLAINT:  Abdominal pain  History of Present Illness:  78 y/o male who has a PMH for DVT on Eliquis , DMT2, HTN, CAD s/p 1 coronary stent, Factor V Mutation, HLD, Obesity who was in his usual state of health until this morning when he was driving to [place], [place] (he delivers cars to dealerships).  He says he only ate pork skins and a sun drop.  He vomited a couple times going to [place] then several times on the way back.  At home he vomited more and then when he got to the hospital he vomiting a few morn times per wife who was at bedside.  He had a diffusely tender belly on admission.  CT scan abdomen showed: IMPRESSION: 1. Fat stranding and wall thickening about the third and fourth portion of the duodenum, suspicious for duodenitis. There is also wall thickening in the pre pyloric stomach with question of faint adjacent fat stranding, may represent peptic ulcer disease. Additional mild wall thickening involving small bowel in the left abdomen, nonspecific enteritis. 2. Mild edema about the pancreatic head and uncinate process. This is favored to be related to duodenal inflammation, however recommend correlation with pancreatic enzymes. 3. Cholelithiasis. Question mild gallbladder enhancement but no definite pericholecystic inflammation. 4. Nonobstructing right renal stone. 5. Enlarged prostate. 6. Stable left adrenal adenoma.  ED blood work showing LA 9 and decreased to 6.6, increased LFTs, Cr 1.50, WBC 19.3.  Pertinent  Medical History  Abnormal EKG, BPH (benign prostatic hyperplasia), Chronic anticoagulation (01/16/2017), Coronary artery disease, Degenerative joint disease, Diabetes mellitus, type 2 (HCC), DVT (deep venous thrombosis) (HCC) (4 -5 yrs ago), DVT of leg (deep venous thrombosis) (HCC) (07/02/2015), Heart murmur,  Hepatic steatosis, Heterozygous factor V Leiden mutation (HCC) (01/16/2017), Hyperlipidemia, Hypertension, Meningioma (HCC), Neuropathy, Obesity, Primary localized osteoarthritis of right hip (06/24/2019), Sleep apnea (2008), and Stroke [facility])  Significant Hospital Events: Including procedures, antibiotic start and stop dates in addition to other pertinent events   5/29: admit to ICU for septic shock  Interim History / Subjective:  N/a  Objective    Blood pressure (!) 69/51, pulse (!) 120, temperature 97.6 F (36.4 C), temperature source Oral, resp. rate (!) 29, height 5\' 8"  (1.727 m), weight 97.5 kg, SpO2 95%.       No intake or output data in the 24 hours ending 02/28/24 0044 Filed Weights   02/27/24 2032  Weight: 97.5 kg    Examination: General: alert NAD HENT: perrla no icterus eomi Lungs: CTA no wheezes no rales Cardiovascular: reg s1s2 no murmurs gallops or rubs, tachy Abdomen: soft nt nd bs pos no guarding Extremities: no cyanosis, clubbing or edema, decreased capillary refill Neuro: AAO x3 CN II to XII grossly intact   Resolved problem list   Assessment and Plan  Sepsis/Septic shock Hypotension form sepsis Given Sepsis fluid bolus Started on IV Antibiotics and cultures done in ED Support BP with IV vasopressors Duodenitis Bowel rest NPO for now Surgery to see patient-consulted by ED Pancreatitis Pain meds Monitor enzymes Gastroenteritis IV fluids and npo for now Lactic Acidosis Continue with IV fluids Improve perfusion Maintain MAPS Continue to trend Transaminitis Continue to monitor LFTs AKI IV hydration Monitor I's/O's Monitor serial Cr Dehydration Continue with IV fluids  Best Practice (right click and "Reselect all SmartList Selections" daily)   Diet/type: NPO DVT  prophylaxis DOAC Pressure ulcer(s): N/A GI prophylaxis: H2B Lines: N/A Foley:  N/A Code Status:  full code   Labs   CBC: Recent Labs  Lab 02/27/24 2054  WBC 19.3*   NEUTROABS 18.5*  HGB 17.0  HCT 51.4  MCV 91.0  PLT 318    Basic Metabolic Panel: Recent Labs  Lab 02/27/24 2240  NA 139  K 3.8  CL 108  CO2 18*  GLUCOSE 157*  BUN 19  CREATININE 1.50*  CALCIUM  8.3*   GFR: Estimated Creatinine Clearance: 45.2 mL/min (A) (by C-G formula based on SCr of 1.5 mg/dL (H)). Recent Labs  Lab 02/27/24 2054 02/27/24 2202 02/27/24 2311  WBC 19.3*  --   --   LATICACIDVEN  --  9.0* 6.6*    Liver Function Tests: Recent Labs  Lab 02/27/24 2240  AST 329*  ALT 262*  ALKPHOS 141*  BILITOT 3.0*  PROT 4.8*  ALBUMIN 2.5*   Recent Labs  Lab 02/27/24 2240  LIPASE 1,537*   No results for input(s): "AMMONIA" in the last 168 hours.  ABG    Component Value Date/Time   TCO2 29 02/21/2012 1110     Coagulation Profile: No results for input(s): "INR", "PROTIME" in the last 168 hours.  Cardiac Enzymes: No results for input(s): "CKTOTAL", "CKMB", "CKMBINDEX", "TROPONINI" in the last 168 hours.  HbA1C: Hgb A1c MFr Bld  Date/Time Value Ref Range Status  12/29/2022 04:05 PM 7.6 (H) 4.8 - 5.6 % Final    Comment:             Prediabetes: 5.7 - 6.4          Diabetes: >6.4          Glycemic control for adults with diabetes: <7.0   05/19/2022 09:18 AM 7.5 (H) 4.8 - 5.6 % Final    Comment:    (NOTE) Pre diabetes:          5.7%-6.4%  Diabetes:              >6.4%  Glycemic control for   <7.0% adults with diabetes     CBG: No results for input(s): "GLUCAP" in the last 168 hours.  Review of Systems:   Abd pain, n/v minimal diarrhea, no chest pain no SOB  Past Medical History:  He,  has a past medical history of Abnormal EKG, BPH (benign prostatic hyperplasia), Chronic anticoagulation (01/16/2017), Coronary artery disease, Degenerative joint disease, Diabetes mellitus, type 2 (HCC), DVT (deep venous thrombosis) (HCC) (4 -5 yrs ago), DVT of leg (deep venous thrombosis) (HCC) (07/02/2015), Heart murmur, Hepatic steatosis, Heterozygous factor V  Leiden mutation (HCC) (01/16/2017), Hyperlipidemia, Hypertension, Meningioma (HCC), Neuropathy, Obesity, Primary localized osteoarthritis of right hip (06/24/2019), Sleep apnea (2008), and Stroke [facility]).   Surgical History:   Past Surgical History:  Procedure Laterality Date   BIOPSY  04/16/2019   Procedure: BIOPSY;  Surgeon: Alyce Jubilee, MD;  Location: [facility];  Service: Endoscopy;;   CATARACT EXTRACTION W/PHACO Left 04/03/2016   Procedure: CATARACT EXTRACTION PHACO AND INTRAOCULAR LENS PLACEMENT LEFT EYE CDE=21.76;  Surgeon: Clay Cummins, MD;  Location: [facility] ORS;  Service: Ophthalmology;  Laterality: Left;   CATARACT EXTRACTION W/PHACO Right 06/19/2016   Procedure: CATARACT EXTRACTION PHACO AND INTRAOCULAR LENS PLACEMENT; CDE:  11.56;  Surgeon: Clay Cummins, MD;  Location: [facility] OR[facility];  Service: Ophthalmology;  Laterality: Right;   COLONOSCOPY N/A 04/16/2019   Procedure: COLONOSCOPY;  Surgeon: Alyce Jubilee, MD;  Location[facility];  Service:  Endoscopy;  Laterality: N/A;  8:30am   COLONOSCOPY W/ POLYPECTOMY  05/2008   polypectomy x4-adenomatous; internal hemorrhoids   CORONARY ANGIOPLASTY WITH STENT PLACEMENT  06/04/2012     DES    to [place]   CORONARY STENT PLACEMENT     x 1   DUPUYTREN / PALMAR FASCIOTOMY  2009   rt hand-dsc-went home same day x2   ESOPHAGOGASTRODUODENOSCOPY N/A 04/16/2019   Procedure: ESOPHAGOGASTRODUODENOSCOPY (EGD);  Surgeon: Alyce Jubilee, MD;  [facility];  Service: Endoscopy;  Laterality: N/A;   LEFT HEART CATHETERIZATION WITH CORONARY ANGIOGRAM N/A 05/28/2012   Procedure: LEFT HEART CATHETERIZATION WITH CORONARY ANGIOGRAM;  Surgeon: Jessica Morn, MD;  Location[facility] LAB;  Service: Cardiovascular;  Laterality: N/A;   PERCUTANEOUS CORONARY STENT INTERVENTION (PCI-S) N/A 06/04/2012   Procedure: PERCUTANEOUS CORONARY STENT INTERVENTION (PCI-S);  Surgeon: Jessica Morn, MD;  Location[facility] C[facility] LAB;  Service: Cardiovascular;  Laterality: N/A;    POLYPECTOMY  04/16/2019   Procedure: POLYPECTOMY;  Surgeon: Alyce Jubilee, MD;  Location[facility];  Service: Endoscopy;;   TONSILLECTOMY     TOTAL HIP ARTHROPLASTY Right 06/24/2019   Procedure: TOTAL HIP ARTHROPLASTY;  Surgeon: Osa Blase, MD;  Location: [facility] ORS;  Service: Orthopedics;  Laterality: Right;   VENTRAL HERNIA REPAIR  2010   umb hernia-[facility]-went home same day     Social History:   reports that he has never smoked. He has never used smokeless tobacco. He reports that he does not drink alcohol and does not use drugs.   Family History:  His family history includes Brain cancer in his mother; Breast cancer in his daughter; Cancer in his father and mother; Prostate cancer in his father; Thyroid  cancer in his daughter. There is no history of Coronary artery disease or Colon cancer.   Allergies Allergies  Allergen Reactions   Empagliflozin       Home Medications  Prior to Admission medications   Medication Sig Start Date End Date Taking? Authorizing Provider  acebutolol  (SECTRAL ) 200 MG capsule Take 1 capsule (200 mg total) by mouth 2 (two) times daily. 12/14/23   Knox Perl, MD  apixaban  (ELIQUIS ) 5 MG TABS tablet Take 5 mg by mouth 2 (two) times daily.    [provider]  lisinopril  (ZESTRIL ) 5 MG tablet TAKE (1) TABLET BY MOUTH ONCE DAILY. 12/06/23   Tobi Fortes, MD  metFORMIN  (GLUCOPHAGE ) 500 MG tablet Take 1 tablet (500 mg total) by mouth 2 (two) times daily with a meal. 09/04/23   Tobi Fortes, MD  Multiple Vitamins-Minerals (ONE-A-DAY 50 PLUS PO) Take 1 tablet by mouth at bedtime.     [provider]  nitroGLYCERIN  (NITROSTAT ) 0.4 MG SL tablet PLACE 1 TAB UNDER TONGUE EVERY 5 MIN IF NEEDED FOR CHEST PAIN. MAY USE 3 TIMES.NO RELIEF CALL 911. 08/15/23   Knox Perl, MD  rosuvastatin  (CRESTOR ) 40 MG tablet Take 1 tablet (40 mg total) by mouth daily. 07/06/23   Tobi Fortes, MD  Semaglutide,0.25 or 0.5MG /DOS, 2 MG/3ML SOPN Inject 1.5 mg into  the skin once a week. 08/18/22   [provider]  spironolactone  (ALDACTONE ) 50 MG tablet TAKE 1 TABLET BY MOUTH EVERY MORNING 08/01/23   Dixon, Phillip E, MD  testosterone  cypionate (DEPOTESTOSTERONE CYPIONATE) 200 MG/ML injection INJECT 0.5ML INTO THE MUSCLE EVERY 2 WEEKS. 11/12/23   Trent Frizzle, MD  torsemide  (DEMADEX ) 20 MG tablet Take 20 mg by mouth daily as needed (swelling). 12/19/16   [provider]  verapamil  (CALAN -SR) 240 MG CR tablet TAKE ONE TABLET BY MOUTH ONCE DAILY AT BEDTIME 11/06/23   Tobi Fortes, MD     Critical care time: 50   The patient is critically ill with multiple organ system failure and requires high complexity decision making for assessment and support, frequent evaluation and titration of therapies, advanced monitoring, review of radiographic studies and interpretation of complex data.   Critical Care Time devoted to patient care services, exclusive of separately billable procedures, described in this note is 35 minutes.   Claven Cumming,  MD [facility] See Amion for pager  If no response to pager , please call [phone] until 7pm After 7:00 pm call Elink  [phone] 02/28/2024, 12:44 AM

## 2024-02-28 NOTE — Consult Note (Signed)
 [facility]    Date of Admission:  02/27/2024     Total days of antibiotics                Reason for Consult: Bacteremia   Referring Provider: Mose Arena Primary Care Provider: Tobi Fortes, MD   ASSESSMENT:  Omar [name] is a 78 year old Caucasian male admitted with abdominal pain, distention, and vomiting and found to have polymicrobial bacteremia with Enterococcus faecalis, Klebsiella pneumonia, and E. coli with questionable cholecystitis or pancreatitis given elevated liver chemistries and lipase. Source of infection likely GI tract and could be translocation.   Surgery seeing no surgical indication  Hx ?passed a stone and had pancreatitis/cholangitis changes due to that?  No gi evaluation yet -- will defer to team if that is needed  Clinically improving and today 5/29 he current feels well no complaint; pressors titrated off  Sx onset quickly 2 days prior to admission and I have low suspicion for endocarditis   PLAN:  Change antibiotics to piperacillin-tazobactam. Bcx repeat tomorrow 5/30 Consider tte Primary team sending gi pcr (diarrhea ?related to abx); Continue enteric precautions while awaiting GI pathogen panel. Remaining medical and supportive care per CCM. Discussed with pulm ccm   Principal Problem:   Septic shock (HCC) Active Problems:   Bacteremia due to Enterococcus   Bacteremia due to Klebsiella pneumoniae   Bacteremia due to Escherichia coli    Chlorhexidine  Gluconate Cloth  6 each Topical Daily   hydrocortisone sod succinate (SOLU-CORTEF) inj  100 mg Intravenous Q12H   insulin  aspart  0-15 Units Subcutaneous TID WC   melatonin  5 mg Oral Once   pantoprazole  (PROTONIX ) IV  40 mg Intravenous Q24H   sodium chloride  flush  10-40 mL Intracatheter Q12H     HPI: Omar [name] is a 78 y.o. male with previous medical history diabetes, hypertension, obstructive sleep apnea, and stroke (no residual deficits), and s/p  total right hip arthroplasty in 2020 presenting to the ED with abdominal pain, vomiting and distension.  Omar [name] began having symptoms on the morning of presentation when he experienced vomiting and abdominal pain. Afebrile on admission with leukocytosis with WBC count of 26,300.  CT angio chest/abdomen/pelvis with fat stranding and wall thickening about the 3rd and 4th portion of the duodenum suspicious for duodenitis also with questionable peptic ulcer disease; mild edema about the pancreatic head and unicate process favoring duodenal inflammation; and cholelithiasis. US  abdomen with cholelithiasis without evidence of acute cholecystitis.  Lab work remarkable for lactic acidosis at 2.6; elevated liver chemistries with AST 329, ALT 262, ALP 141 and lipase 1537.  Found to have hypotension and treated for sepsis.  Blood cultures positive for Enterococcus faecalis, E. coli, and Klebsiella pneumoniae.  Surgery consulted with no indications for emergent surgery and possibly consideration of cholecystectomy once more stable.  Received broad-spectrum antibiotics with vancomycin , cefepime, and metronidazole  which has now been narrowed to piperacillin-tazobactam.  Currently on enteric precautions while awaiting gastrointestinal panel collection.  Now has a central venous catheter.  He feels well now Acute onset sx 2 days prior No travel No exotic food/left over food No pets/animals No hx gall stone/pancreatitis/cholecystitis   Review of Systems: ROS All other ros negative  Past Medical History:  Diagnosis Date   Abnormal EKG    Inferior Q waves   BPH (benign prostatic hyperplasia)    Chronic anticoagulation 01/16/2017   Coronary artery disease    Degenerative joint disease  Diabetes mellitus, type 2 (HCC)    No insulin    DVT (deep venous thrombosis) (HCC) 4 -5 yrs ago   DVT of leg (deep venous thrombosis) (HCC) 07/02/2015   Heart murmur    Hepatic steatosis    Heterozygous factor V Leiden  mutation (HCC) 01/16/2017   Hyperlipidemia    Hypertension    Echo in 2008-technically limited, mild LVH, normal EF; 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 Joleen Navy interpreted the study-CPAP recommended, but refused by patient   Stroke [facility])    no deficits from stroke, "Didnt know I had one when they say I did".    Social History   Tobacco Use   Smoking status: Never   Smokeless tobacco: Never  Vaping Use   Vaping status: Never Used  Substance Use Topics   Alcohol use: No   Drug use: No    Family History  Problem Relation Age of Onset   Cancer Mother    Brain cancer Mother    Cancer Father    Prostate cancer Father    Breast cancer Daughter    Thyroid  cancer Daughter    Coronary artery disease Neg Hx    Colon cancer Neg Hx     Allergies  Allergen Reactions   Empagliflozin      OBJECTIVE: Blood pressure (!) 75/61, pulse 89, temperature 98.3 F (36.8 C), temperature source Axillary, resp. rate (!) 25, height 5\' 8"  (1.727 m), weight 97.5 kg, SpO2 94%.  Physical Exam General/constitutional: no distress, pleasant HEENT: Normocephalic, PER, Conj Clear, EOMI, Oropharynx clear Neck supple CV: rrr no mrg Lungs: clear to auscultation, normal respiratory effort Abd: Soft, Nontender Ext: no edema Skin: No Rash Neuro: nonfocal MSK: no peripheral joint swelling/tenderness/warmth; back spines nontender    Lab Results Lab Results  Component Value Date   WBC 26.3 (H) 02/28/2024   HGB 13.7 02/28/2024   HCT 40.8 02/28/2024   MCV 90.5 02/28/2024   PLT 195 02/28/2024    Lab Results  Component Value Date   CREATININE 1.75 (H) 02/28/2024   BUN 24 (H) 02/28/2024   NA 140 02/28/2024   K 3.4 (L) 02/28/2024   CL 107 02/28/2024   CO2 19 (L) 02/28/2024    Lab Results  Component  Value Date   ALT 249 (H) 02/28/2024   AST 257 (H) 02/28/2024   ALKPHOS 115 02/28/2024   BILITOT 3.1 (H) 02/28/2024     Microbiology: Recent Results (from the past 240 hours)  Culture, blood (routine x 2)     Status: None (Preliminary result)   Collection Time: 02/27/24  9:17 PM   Specimen: BLOOD LEFT FOREARM  Result Value Ref Range Status   Specimen Description BLOOD LEFT FOREARM  Final   Special Requests   Final    BOTTLES DRAWN AEROBIC AND ANAEROBIC Blood Culture adequate volume   Culture  Setup Time   Final    GRAM NEGATIVE RODS GRAM POSITIVE COCCI IN BOTH AEROBIC AND ANAEROBIC BOTTLES CRITICAL VALUE NOTED.  VALUE IS CONSISTENT WITH PREVIOUSLY REPORTED AND CALLED VALUE. Performed at [facility], [address] [place], [place] [address]    Culture GRAM NEGATIVE RODS GRAM POSITIVE COCCI   Final   Report Status PENDING  Incomplete  Culture, blood (routine x  2)     Status: None (Preliminary result)   Collection Time: 02/27/24  9:22 PM   Specimen: BLOOD RIGHT FOREARM  Result Value Ref Range Status   Specimen Description BLOOD RIGHT FOREARM  Final   Special Requests   Final    BOTTLES DRAWN AEROBIC AND ANAEROBIC Blood Culture adequate volume   Culture  Setup Time   Final    GRAM NEGATIVE RODS GRAM POSITIVE COCCI IN BOTH AEROBIC AND ANAEROBIC BOTTLES CRITICAL RESULT CALLED TO, READ BACK BY AND VERIFIED WITH: Carmie Chough on [id number] @0830  by SM Performed at [facility], [address], [place], [place] [address]    Culture Hillis Lu NEGATIVE RODS GRAM POSITIVE COCCI   Final   Report Status PENDING  Incomplete  Blood Culture ID Panel (Reflexed)     Status: Abnormal   Collection Time: 02/27/24  9:22 PM  Result Value Ref Range Status   Enterococcus faecalis DETECTED (A) NOT DETECTED Final    Comment: CRITICAL RESULT CALLED TO, READ BACK BY AND VERIFIED WITH: PHARMD Emily on [id number] @0830  by SM    Enterococcus Faecium NOT DETECTED NOT DETECTED Final   Listeria  monocytogenes NOT DETECTED NOT DETECTED Final   Staphylococcus species NOT DETECTED NOT DETECTED Final   Staphylococcus aureus (BCID) NOT DETECTED NOT DETECTED Final   Staphylococcus epidermidis NOT DETECTED NOT DETECTED Final   Staphylococcus lugdunensis NOT DETECTED NOT DETECTED Final   Streptococcus species NOT DETECTED NOT DETECTED Final   Streptococcus agalactiae NOT DETECTED NOT DETECTED Final   Streptococcus pneumoniae NOT DETECTED NOT DETECTED Final   Streptococcus pyogenes NOT DETECTED NOT DETECTED Final   A.calcoaceticus-baumannii NOT DETECTED NOT DETECTED Final   Bacteroides fragilis NOT DETECTED NOT DETECTED Final   Enterobacterales DETECTED (A) NOT DETECTED Final    Comment: CRITICAL RESULT CALLED TO, READ BACK BY AND VERIFIED WITH: PHARMD Emily on [id number] @0830  by SM    Enterobacter cloacae complex NOT DETECTED NOT DETECTED Final   Escherichia coli DETECTED (A) NOT DETECTED Final    Comment: CRITICAL RESULT CALLED TO, READ BACK BY AND VERIFIED WITH: PHARMD Emily on [id number] @0830  by SM    Klebsiella aerogenes NOT DETECTED NOT DETECTED Final   Klebsiella oxytoca NOT DETECTED NOT DETECTED Final   Klebsiella pneumoniae DETECTED (A) NOT DETECTED Final    Comment: CRITICAL RESULT CALLED TO, READ BACK BY AND VERIFIED WITH: PHARMD Emily on [id number] @0830  by SM    Proteus species NOT DETECTED NOT DETECTED Final   Salmonella species NOT DETECTED NOT DETECTED Final   Serratia marcescens NOT DETECTED NOT DETECTED Final   Haemophilus influenzae NOT DETECTED NOT DETECTED Final   Neisseria meningitidis NOT DETECTED NOT DETECTED Final   Pseudomonas aeruginosa NOT DETECTED NOT DETECTED Final   Stenotrophomonas maltophilia NOT DETECTED NOT DETECTED Final   Candida albicans NOT DETECTED NOT DETECTED Final   Candida auris NOT DETECTED NOT DETECTED Final   Candida glabrata NOT DETECTED NOT DETECTED Final   Candida krusei NOT DETECTED NOT DETECTED Final   Candida parapsilosis NOT DETECTED  NOT DETECTED Final   Candida tropicalis NOT DETECTED NOT DETECTED Final   Cryptococcus neoformans/gattii NOT DETECTED NOT DETECTED Final   CTX-M ESBL NOT DETECTED NOT DETECTED Final   Carbapenem resistance IMP NOT DETECTED NOT DETECTED Final   Carbapenem resistance KPC NOT DETECTED NOT DETECTED Final   Carbapenem resistance NDM NOT DETECTED NOT DETECTED Final   Carbapenem resist OXA 48 LIKE NOT DETECTED NOT DETECTED Final   Vancomycin  resistance NOT  DETECTED NOT DETECTED Final   Carbapenem resistance VIM NOT DETECTED NOT DETECTED Final    Comment: Performed at [facility], [address] [place], [place] [address]  MRSA Next Gen by PCR, Nasal     Status: None   Collection Time: 02/28/24 12:43 AM   Specimen: Nasal Mucosa; Nasal Swab  Result Value Ref Range Status   MRSA by PCR Next Gen NOT DETECTED NOT DETECTED Final    Comment: (NOTE) The GeneXpert MRSA Assay (FDA approved for NASAL specimens only), is one component of a comprehensive MRSA colonization surveillance program. It is not intended to diagnose MRSA infection nor to guide or monitor treatment for MRSA infections. Test performance is not FDA approved in patients less than 21 years old. Performed at [facility], [address] [place], [place] [address]     Imaging: Reviewed  5/19 limited ruq us  1. Cholelithiasis without evidence of acute cholecystitis.    5/18 ct angio chest abd pelv 1. Fat stranding and wall thickening about the third and fourth portion of the duodenum, suspicious for duodenitis. There is also wall thickening in the pre pyloric stomach with question of faint adjacent fat stranding, may represent peptic ulcer disease. Additional mild wall thickening involving small bowel in the left abdomen, nonspecific enteritis. 2. Mild edema about the pancreatic head and uncinate process. This is favored to be related to duodenal inflammation, however recommend correlation with pancreatic  enzymes. 3. Cholelithiasis. Question mild gallbladder enhancement but no definite pericholecystic inflammation. 4. Nonobstructing right renal stone. 5. Enlarged prostate. 6. Stable left adrenal adenoma.      Jamesetta Mcbride, MD [facility] [phone]  pager   [phone] cell 02/28/2024, 12:15 PM

## 2024-02-28 NOTE — Discharge Instructions (Addendum)
Patient transported to the emergency department via EMS. 

## 2024-02-28 NOTE — ED Provider Notes (Signed)
 [facility]    CSN: [id number] Arrival date & time: 02/27/24  1811      History   Chief Complaint Chief Complaint  Patient presents with   Abdominal Pain    HPI Omar [name] is a 78 y.o. male.   The history is provided by the patient and the spouse.   Patient presents with his wife for complaints of abdominal pain with nausea and vomiting.  Patient states symptoms started earlier this morning.  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  States that he had a small bag of pork skins and a son dropped soda.  Patient states his last episode of vomiting was earlier today.  Wife reports that she took his blood pressure at home and it was 108/48.  Patient's wife states that patient appears "bloated."  Patient states he is not having any difficulty urinating, states he last urinated approximately 2 hours prior to arrival. 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 Joleen Navy interpreted the study-CPAP recommended, but refused by patient   Stroke [facility])    no deficits from stroke, "Didnt know I had one when they say I did".    Patient Active Problem List   Diagnosis Date Noted   Septic  shock (HCC) 02/28/2024   Bacteremia due to Enterococcus 02/28/2024   Bacteremia due to Klebsiella pneumoniae 02/28/2024   Bacteremia due to Escherichia coli 02/28/2024   Need for influenza vaccination 07/22/2023   Mass of right lower leg 03/08/2023   Hematoma 02/28/2023   Encounter for routine adult health examination with abnormal findings 11/09/2022   Dupuytren's contracture of right hand 09/29/2022   Right leg swelling 06/22/2022   Preventative health care 06/22/2022   Sepsis secondary to UTI (HCC) 05/19/2022   AKI (acute kidney injury) (HCC) 05/19/2022    Class: Acute   Hypotensive episode 05/19/2022    Class: Acute   Hyponatremia 05/19/2022    Class: Acute   Testosterone  deficiency in male 01/27/2022   Insomnia 11/01/2021   Primary localized osteoarthritis of right hip 06/24/2019   S/P total hip arthroplasty 06/24/2019   Positive colorectal cancer screening using Cologuard test 01/23/2019   Morbid obesity (HCC) 11/16/2017   Heterozygous factor V Leiden mutation (HCC) 01/16/2017   Chronic anticoagulation 01/16/2017   Vitamin D  deficiency 01/21/2016   DVT of leg (deep venous thrombosis) (HCC) 07/02/2015   CAD (coronary artery disease), native coronary artery 06/05/2012   Postsurgical percutaneous transluminal coronary angioplasty (PTCA) status 06/05/2012   Essential hypertension    Type 2 diabetes mellitus with vascular disease (HCC)  Sleep apnea    Meningioma (HCC)    Mixed hyperlipidemia    Hepatic steatosis    Abnormal EKG     Past Surgical History:  Procedure Laterality Date   BIOPSY  04/16/2019   Procedure: BIOPSY;  Surgeon: Alyce Jubilee, MD;  Location: [facility];  Service: Endoscopy;;   CATARACT EXTRACTION W/PHACO Left 04/03/2016   Procedure: CATARACT EXTRACTION PHACO AND INTRAOCULAR LENS PLACEMENT LEFT EYE CDE=21.76;  Surgeon: Clay Cummins, MD;  Location: [facility];  Service: Ophthalmology;  Laterality: Left;   CATARACT EXTRACTION W/PHACO Right 06/19/2016    Procedure: CATARACT EXTRACTION PHACO AND INTRAOCULAR LENS PLACEMENT; CDE:  11.56;  Surgeon: Clay Cummins, MD;  Location: [facility];  Service: Ophthalmology;  Laterality: Right;   COLONOSCOPY N/A 04/16/2019   Procedure: COLONOSCOPY;  Surgeon: Alyce Jubilee, MD;  Location: [facility];  Service: Endoscopy;  Laterality: N/A;  8:30am   COLONOSCOPY W/ POLYPECTOMY  05/2008   polypectomy x4-adenomatous; internal hemorrhoids   CORONARY ANGIOPLASTY WITH STENT PLACEMENT  06/04/2012     DES    to RI   CORONARY STENT PLACEMENT     x 1   DUPUYTREN / PALMAR FASCIOTOMY  2009   rt hand-dsc-went home same day x2   ESOPHAGOGASTRODUODENOSCOPY N/A 04/16/2019   Procedure: ESOPHAGOGASTRODUODENOSCOPY (EGD);  Surgeon: Alyce Jubilee, MD;  Location: [facility];  Service: Endoscopy;  Laterality: N/A;   LEFT HEART CATHETERIZATION WITH CORONARY ANGIOGRAM N/A 05/28/2012   Procedure: LEFT HEART CATHETERIZATION WITH CORONARY ANGIOGRAM;  Surgeon: Jessica Morn, MD;  Location: [facility] CATH LAB;  Service: Cardiovascular;  Laterality: N/A;   PERCUTANEOUS CORONARY STENT INTERVENTION (PCI-S) N/A 06/04/2012   Procedure: PERCUTANEOUS CORONARY STENT INTERVENTION (PCI-S);  Surgeon: Jessica Morn, MD;  Location: [facility] CATH LAB;  Service: Cardiovascular;  Laterality: N/A;   POLYPECTOMY  04/16/2019   Procedure: POLYPECTOMY;  Surgeon: Alyce Jubilee, MD;  Location: [facility];  Service: Endoscopy;;   TONSILLECTOMY     TOTAL HIP ARTHROPLASTY Right 06/24/2019   Procedure: TOTAL HIP ARTHROPLASTY;  Surgeon: Osa Blase, MD;  Location: [facility];  Service: Orthopedics;  Laterality: Right;   VENTRAL HERNIA REPAIR  2010   umb hernia-[facility]-went home same day       Home Medications    Prior to Admission medications   Medication Sig Start Date End Date Taking? Authorizing Provider  acebutolol  (SECTRAL ) 200 MG capsule Take 1 capsule (200 mg total) by mouth 2 (two) times daily. 12/14/23   Knox Perl, MD  acetaminophen  (TYLENOL ) 500 MG  tablet Take 1,000 mg by mouth every 6 (six) hours as needed.    [provider]  apixaban  (ELIQUIS ) 5 MG TABS tablet Take 5 mg by mouth 2 (two) times daily.    [provider]  HYDROcodone-acetaminophen  (NORCO/VICODIN) 5-325 MG tablet Take by mouth. 01/23/24   [provider]  lisinopril  (ZESTRIL ) 5 MG tablet TAKE (1) TABLET BY MOUTH ONCE DAILY. 12/06/23   Tobi Fortes, MD  metFORMIN  (GLUCOPHAGE ) 500 MG tablet Take 1 tablet (500 mg total) by mouth 2 (two) times daily with a meal. 09/04/23   Tobi Fortes, MD  Multiple Vitamins-Minerals (ONE-A-DAY 50 PLUS PO) Take 1 tablet by mouth at bedtime.     [provider]  nitroGLYCERIN  (NITROSTAT ) 0.4 MG SL tablet PLACE 1 TAB UNDER TONGUE EVERY 5 MIN IF NEEDED FOR CHEST PAIN. MAY USE 3 TIMES.NO RELIEF CALL 911. 08/15/23   Knox Perl, MD  rosuvastatin  (CRESTOR ) 40  MG tablet Take 1 tablet (40 mg total) by mouth daily. Patient taking differently: Take 40 mg by mouth at bedtime. 07/06/23   Tobi Fortes, MD  Semaglutide,0.25 or 0.5MG /DOS, 2 MG/3ML SOPN Inject 2 mg into the skin once a week. Inject 2mg  into skin on Sunday    [provider]  spironolactone  (ALDACTONE ) 50 MG tablet TAKE 1 TABLET BY MOUTH EVERY MORNING 08/01/23   Tobi Fortes, MD  tamsulosin  (FLOMAX ) 0.4 MG CAPS capsule Take 0.4 mg by mouth. 08/31/23   [provider]  torsemide  (DEMADEX ) 20 MG tablet Take 20 mg by mouth daily as needed (swelling). 12/19/16   [provider]  verapamil  (CALAN -SR) 240 MG CR tablet TAKE ONE TABLET BY MOUTH ONCE DAILY AT BEDTIME 11/06/23   Tobi Fortes, MD    Family History Family History  Problem Relation Age of Onset   Cancer Mother    Brain cancer Mother    Cancer Father    Prostate cancer Father    Breast cancer Daughter    Thyroid  cancer Daughter    Coronary artery disease Neg Hx    Colon cancer Neg Hx     Social History Social History   Tobacco Use   Smoking status: Never    Smokeless tobacco: Never  Vaping Use   Vaping status: Never Used  Substance Use Topics   Alcohol use: No   Drug use: No     Allergies   Empagliflozin    Review of Systems Review of Systems Per HPI  Physical Exam Triage Vital Signs ED Triage Vitals  Encounter Vitals Group     BP 02/27/24 1818 121/80     Systolic BP Percentile --      Diastolic BP Percentile --      Pulse Rate 02/27/24 1818 (!) 122     Resp 02/27/24 1818 20     Temp --      Temp src --      SpO2 02/27/24 1818 91 %     Weight --      Height --      Head Circumference --      Peak Flow --      Pain Score 02/27/24 1822 6     Pain Loc --      Pain Education --      Exclude from Growth Chart --    No data found.  Updated Vital Signs BP 121/80 (BP Location: Right Arm)   Pulse (!) 122   Resp 20   SpO2 91%   Visual Acuity Right Eye Distance:   Left Eye Distance:   Bilateral Distance:    Right Eye Near:   Left Eye Near:    Bilateral Near:     Physical Exam Vitals and nursing note reviewed.  Constitutional:      Appearance: He is well-developed. He is ill-appearing.  HENT:     Head: Normocephalic.  Eyes:     Extraocular Movements: Extraocular movements intact.     Pupils: Pupils are equal, round, and reactive to light.  Cardiovascular:     Rate and Rhythm: Regular rhythm. Tachycardia present.     Pulses: Normal pulses.     Heart sounds: Normal heart sounds.  Pulmonary:     Effort: Pulmonary effort is normal.     Breath sounds: Normal breath sounds.  Abdominal:     General: There is distension.     Tenderness: There is generalized abdominal tenderness. There is no rebound.  Musculoskeletal:     Cervical back: Normal range of motion.  Skin:    General: Skin is warm and dry.  Neurological:     General: No focal deficit present.     Mental Status: He is alert and oriented to person, place, and time.     Comments: Restless      UC Treatments / Results  Labs (all labs ordered are  listed, but only abnormal results are displayed) Labs Reviewed - No data to display  EKG   Radiology   Procedures Procedures (including critical care time)  Medications Ordered in UC Medications  0.9 %  sodium chloride  infusion ( Intravenous New Bag/Given 02/27/24 1953)    Initial Impression / Assessment and Plan / UC Course  I have reviewed the triage vital signs and the nursing notes.  Pertinent labs & imaging results that were available during my care of the patient were reviewed by me and considered in my medical decision making (see chart for details).  Patient presents for complaints of abdominal pain with nausea and vomiting that started earlier today.  Request urine sample from patient; however he is unable to provide 1.  On exam, patient is pale, diaphoretic, and appears weak.  BP is mostly stable at this time.  Discussion with patient and spouse that it is recommended the patient go to the emergency department for further evaluation.  Will recheck patient's vital signs to ensure patient is stable Patient's vital signs rechecked at discharge.  BP 74/45.  Patient with increased pallor, weakness, and diaphoresis.  EMS contacted for transport to the emergency department.  Patient and spouse were in agreement with this plan of care.#20g PIV established in the left AC, normal saline at 500 mL/h initiated for fluid resuscitation.  EMS is at the bedside.  Patient transported to the emergency department via EMS. Final Clinical Impressions(s) / UC Diagnoses   Final diagnoses:  Hypotension, unspecified hypotension type  Abdominal pain, unspecified abdominal location   Discharge Instructions   None    ED Prescriptions   None    PDMP not reviewed this encounter.   Hardy Lia, NP 02/28/24 1440

## 2024-02-28 NOTE — Progress Notes (Signed)
 Pharmacy Antibiotic Note  Omar [name] is a 78 y.o. male admitted on 02/27/2024 with sepsis.  Pharmacy has been consulted for vancomycin  and cefepime  dosing.  Plan: Cefepime  2g IV q12h Vancomycin  1750mg  IV q48h for estimated AUC 492 using SCr 1.5, Vd 0.5 Check vancomycin  levels at steady state, goal AUC 400-550 Follow up renal function, cultures as available, clinical progress, length of tx  Height: 5\' 8"  (172.7 cm) Weight: 97.5 kg (215 lb) IBW/kg (Calculated) : 68.4  Temp (24hrs), Avg:97.6 F (36.4 C), Min:97.6 F (36.4 C), Max:97.6 F (36.4 C)  Recent Labs  Lab 02/27/24 2054 02/27/24 2202 02/27/24 2240 02/27/24 2311  WBC 19.3*  --   --   --   CREATININE  --   --  1.50*  --   LATICACIDVEN  --  9.0*  --  6.6*    Estimated Creatinine Clearance: 45.2 mL/min (A) (by C-G formula based on SCr of 1.5 mg/dL (H)).    Allergies  Allergen Reactions   Empagliflozin      Antimicrobials this admission: 5/28 Cefepime  >> 5/28 Flagyl  >> 5/28 Vanc >>  Dose adjustments this admission:  Microbiology results: 5/28 BCx: 5/28 MRSA PCR:   Thank you for allowing pharmacy to be a part of this patient's care.  Armanda Bern, PharmD, BCPS 02/28/2024 12:50 AM

## 2024-02-28 NOTE — ED Provider Notes (Signed)
 Due to patient's medical complexity, elevated lactate, continued hypertension, I have consulted critical care who will come to see the patient in admission Patient has already received IV antibiotics   Eldon Greenland, MD 02/28/24 [phone]

## 2024-02-28 NOTE — Procedures (Addendum)
 Arterial Catheter Insertion Procedure Note  Omar [name]  [id number]  Feb 23, 1946  Date:02/28/24  Time:4:18 AM    Provider Performing: Royden Corin    Procedure: Insertion of Arterial Line ([id number]) without US  guidance  Indication(s) Blood pressure monitoring and/or need for frequent ABGs  Consent Risks of the procedure as well as the alternatives and risks of each were explained to the patient and/or caregiver.  Consent for the procedure was obtained and is signed in the bedside chart  Anesthesia None   Time Out Verified patient identification, verified procedure, site/side was marked, verified correct patient position, special equipment/implants available, medications/allergies/relevant history reviewed, required imaging and test results available.   Sterile Technique Maximal sterile technique including full sterile barrier drape, hand hygiene, sterile gown, sterile gloves, mask, hair covering, sterile ultrasound probe cover (if used).   Procedure Description Area of catheter insertion was cleaned with chlorhexidine  and draped in sterile fashion. Without real-time ultrasound guidance an arterial catheter was placed into the left radial artery.  Appropriate arterial tracings confirmed on monitor.     Complications/Tolerance None; patient tolerated the procedure well.   EBL Minimal   Specimen(s) None

## 2024-02-28 NOTE — Procedures (Signed)
 Central Venous Catheter Insertion Procedure Note  Omar [name]  [id number]  07-06-1946  Date:02/28/24  Time:4:48 AM   Provider Performing:Audrianna Driskill D Marlys Singh   Procedure: Insertion of Non-tunneled Central Venous [phone]) with US  guidance ([id number])   Indication(s) Medication administration  Consent Risks of the procedure as well as the alternatives and risks of each were explained to the patient and/or caregiver.  Consent for the procedure was obtained and is signed in the bedside chart  Anesthesia Topical only with 1% lidocaine    Timeout Verified patient identification, verified procedure, site/side was marked, verified correct patient position, special equipment/implants available, medications/allergies/relevant history reviewed, required imaging and test results available.  Sterile Technique Maximal sterile technique including full sterile barrier drape, hand hygiene, sterile gown, sterile gloves, mask, hair covering, sterile ultrasound probe cover (if used).  Procedure Description Area of catheter insertion was cleaned with chlorhexidine  and draped in sterile fashion.  With real-time ultrasound guidance a central venous catheter was placed into the left internal jugular vein. Nonpulsatile blood flow and easy flushing noted in all ports.  The catheter was sutured in place and sterile dressing applied.  Complications/Tolerance None; patient tolerated the procedure well. Chest X-ray is ordered to verify placement for internal jugular or subclavian cannulation.   Chest x-ray is not ordered for femoral cannulation.  EBL Minimal  Specimen(s) None  JD Carliss Chess [facility] 02/28/2024, 4:49 AM  Please see [email or web address] for pager details.  From 7A-7P if no response, please call [phone]. After hours, please call ELink [phone].

## 2024-02-28 NOTE — Consult Note (Addendum)
 Reason for Consult:  Gallstone pancreatitis Referring Provider: Mason Sole, MD  HPI  Omar [name] is an 78 y.o. 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  Patient was at baseline health up until yesterday when he states he started to feel nauseated in the afternoon and had several episodes of emesis on a drive to [place] . Once home from [place], he continued to have emesis and abdominal pain. States he had several bowel movements, some diarrhea. Continues to pass flatus. No changes in color of urine or stool, no dysuria. He was seen in UC and sent to ED due to hypotension, diaphoresis. He was given 500cc bolus at US  prior to transfer and given an additional 1L crystalloid in ED here.  Labs notable for lipase of 1537, AST/ALT/ALP of 329/262/141 respectively. Tbili 3.0. Leukocytosis to 19.3, Lactate initially9.0, improved to 6.6 on most recent recheck.  CT showed inflammation of duodenum, pancreas, some gallbladder wall enhancement as well as cholelithiasis and some enteritis of small bowel in left hemiabdomen. US  showed cholelithiasis, borderline gallbladder wall thickness, and cholelithiasis. No pericholecystic fluid. Imaged CBD normal diameter.  10 point review of systems is negative except as listed above in HPI.  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 Joleen Navy interpreted the study-CPAP recommended, but refused by patient   Stroke [facility])    no deficits from stroke, "Didnt know I had one when they say I did".    Past Surgical History: Past Surgical History:  Procedure Laterality Date   BIOPSY  04/16/2019   Procedure: BIOPSY;  Surgeon: Alyce Jubilee, MD;  Location: [facility];  Service: Endoscopy;;   CATARACT EXTRACTION W/PHACO Left 04/03/2016   Procedure: CATARACT EXTRACTION PHACO AND INTRAOCULAR LENS PLACEMENT LEFT EYE CDE=21.76;  Surgeon: Clay Cummins, MD;  Location: [facility] OR[facility];  Service: Ophthalmology;  Laterality: Left;   CATARACT EXTRACTION W/PHACO Right 06/19/2016   Procedure: CATARACT EXTRACTION PHACO AND INTRAOCULAR LENS PLACEMENT; CDE:  11.56;  Surgeon: Clay Cummins, MD;  Location: [facility];  Service: Ophthalmology;  Laterality: Right;   COLONOSCOPY N/A 04/16/2019   Procedure: COLONOSCOPY;  Surgeon: Alyce Jubilee, MD;  [facility]: [facility];  Service: Endoscopy;  Laterality: N/A;  8:30am   COLONOSCOPY W/ POLYPECTOMY  05/2008   polypectomy x4-adenomatous; internal hemorrhoids   CORONARY ANGIOPLASTY WITH STENT PLACEMENT  06/04/2012     DES    to [place]   CORONARY STENT PLACEMENT  x 1   DUPUYTREN / PALMAR FASCIOTOMY  2009   rt hand-dsc-went home same day x2   ESOPHAGOGASTRODUODENOSCOPY N/A 04/16/2019   Procedure: ESOPHAGOGASTRODUODENOSCOPY (EGD);  Surgeon: Alyce Jubilee, MD;  [facility];  Service: Endoscopy;  Laterality: N/A;   LEFT HEART CATHETERIZATION WITH CORONARY ANGIOGRAM N/A 05/28/2012   Procedure: LEFT HEART CATHETERIZATION WITH CORONARY ANGIOGRAM;  Surgeon: Jessica Morn, MD;  Location: [facility] CATH LAB;  Service: Cardiovascular;   Laterality: N/A;   PERCUTANEOUS CORONARY STENT INTERVENTION (PCI-S) N/A 06/04/2012   Procedure: PERCUTANEOUS CORONARY STENT INTERVENTION (PCI-S);  Surgeon: Jessica Morn, MD;  Location: [facility] CATH LAB;  Service: Cardiovascular;  Laterality: N/A;   POLYPECTOMY  04/16/2019   Procedure: POLYPECTOMY;  Surgeon: Alyce Jubilee, MD;  [facility]: [facility];  Service: Endoscopy;;   TONSILLECTOMY     TOTAL HIP ARTHROPLASTY Right 06/24/2019   Procedure: TOTAL HIP ARTHROPLASTY;  Surgeon: Osa Blase, MD;  Location: [facility];  Service: Orthopedics;  Laterality: Right;   VENTRAL HERNIA REPAIR  2010   umb hernia-[facility]-went home same day    Family History:  Family History  Problem Relation Age of Onset   Cancer Mother    Brain cancer Mother    Cancer Father    Prostate cancer Father    Breast cancer Daughter    Thyroid  cancer Daughter    Coronary artery disease Neg Hx    Colon cancer Neg Hx     Social History:  reports that he has never smoked. He has never used smokeless tobacco. He reports that he does not drink alcohol and does not use drugs.  Allergies:  Allergies  Allergen Reactions   Empagliflozin      Medications: I have reviewed the patient's current medications.  Labs: I have personally reviewed all labs for the past 24h  Imaging: I have personally reviewed and interpreted all imaging for the past 24h and agree with the radiologist's impression.  US  Abdomen Limited RUQ (LIVER/GB) Result Date: 02/28/2024 CLINICAL DATA:  Epigastric pain. EXAM: ULTRASOUND ABDOMEN LIMITED RIGHT UPPER QUADRANT COMPARISON:  None Available. FINDINGS: Gallbladder: Shadowing, echogenic gallstones are seen within the gallbladder lumen. The largest measures approximately 2.2 cm. The gallbladder wall measures 3.8 mm in thickness. No sonographic Murphy sign noted by sonographer. Common bile duct: Diameter: 5.7 mm Liver: No focal lesion identified. Diffusely decreased echogenicity of the liver parenchyma is noted.  Portal vein is patent on color Doppler imaging with normal direction of blood flow towards the liver. Other: Technically limited study secondary to limited patient positioning, as per the ultrasound technologist. IMPRESSION: 1. Cholelithiasis without evidence of acute cholecystitis. Electronically Signed   By: Virgle Grime M.D.   On: 02/28/2024 00:35   CT Angio Chest/Abd/Pel for Dissection W and/or W/WO Result Date: 02/27/2024 CLINICAL DATA:  Aortic aneurysm suspected Abdominal pain and distension.  Diaphoretic. EXAM: CT ANGIOGRAPHY CHEST, ABDOMEN AND PELVIS TECHNIQUE: Non-contrast CT of the chest was initially obtained. Multidetector CT imaging through the chest, abdomen and pelvis was performed using the standard protocol during bolus administration of intravenous contrast. Multiplanar reconstructed images and MIPs were obtained and reviewed to evaluate the vascular anatomy. RADIATION DOSE REDUCTION: This exam was performed according to the departmental dose-optimization program which includes automated exposure control, adjustment of the mA and/or kV according to patient size and/or use of iterative reconstruction technique. CONTRAST:  OMNIPAQUE IOHEXOL 350 MG/ML SOLN COMPARISON:  Noncontrast abdominopelvic CT 08/15/2019 FINDINGS: CTA CHEST FINDINGS Cardiovascular: No aortic hematoma on unenhanced exam.  Diffuse aortic atherosclerosis and tortuosity without acute aortic findings. No aortic dissection. 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 There is no central pulmonary embolus to the proximal segmental level. The main pulmonary artery is dilated at 3.8 cm. No pericardial effusion. Coronary artery calcifications. Mediastinum/Nodes: No mediastinal or hilar adenopathy. Unremarkable appearance of the esophagus. No visible thyroid  nodule. Lungs/Pleura: Subsegmental atelectasis in the dependent lower lobes. No confluent airspace disease. No pleural fluid. No features of  pulmonary edema. Calcified granuloma in the right lower lobe, series 8, image 77, benign needing no further imaging follow-up. Musculoskeletal: Thoracic spondylosis. There are no acute or suspicious osseous abnormalities. Mild bilateral gynecomastia. Review of the MIP images confirms the above findings. CTA ABDOMEN AND PELVIS FINDINGS VASCULAR Aorta: Normal caliber aorta without aneurysm, dissection, vasculitis or significant stenosis. Moderate atherosclerosis. Celiac: Patent without evidence of aneurysm, dissection, vasculitis or significant stenosis. SMA: Patent without evidence of aneurysm, dissection, vasculitis or significant stenosis. Renals: 2 right and single left renal arteries are all patent. Renal arteries are patent without evidence of aneurysm, dissection, vasculitis, fibromuscular dysplasia or significant stenosis. IMA: Patent without evidence of aneurysm, dissection, vasculitis or significant stenosis. Inflow: Patent without evidence of aneurysm, dissection, vasculitis or significant stenosis. Moderate atherosclerosis and tortuosity. Veins: No obvious venous abnormality within the limitations of this arterial phase study. Review of the MIP images confirms the above findings. NON-VASCULAR Hepatobiliary: No focal liver abnormality. There are multiple intraluminal gallstones. Question mild gallbladder enhancement but no definite pericholecystic inflammation. Common bile duct is not well-defined on the current exam. Pancreas: Mild edema about the pancreatic head and uncinate process. No ductal dilatation. Spleen: Normal in size without focal abnormality. Adrenals/Urinary Tract: Low-density 2.6 cm left adrenal nodule is unchanged from prior exam and consistent with adenoma. The right adrenal gland is normal. Bilateral renal parenchymal thinning. No hydronephrosis. Nonobstructing 5 mm right renal stone. Partially distended urinary bladder. Stomach/Bowel: The stomach is moderately distended with fluid, tiny  hiatal hernia. There is wall thickening in the pre pyloric stomach, series 7, image 131 with question of faint adjacent fat stranding. Fat stranding and wall thickening about the third and fourth portion of the duodenum, for example series 5, image 57. Chronic small duodenal diverticulum. Mild small bowel wall thickening involving bowel loops in the left abdomen. No obstruction. Moderate volume of stool in the colon. Sigmoid colon is redundant. Lymphatic: Prominent periportal and upper mesenteric nodes. Reproductive: Enlarged prostate spans 5.4 cm. Other: No free air.  No ascites. Musculoskeletal: Right hip arthroplasty. Diffuse lumbar degenerative change. There are no acute or suspicious osseous abnormalities. Review of the MIP images confirms the above findings. IMPRESSION: 1. Fat stranding and wall thickening about the third and fourth portion of the duodenum, suspicious for duodenitis. There is also wall thickening in the pre pyloric stomach with question of faint adjacent fat stranding, may represent peptic ulcer disease. Additional mild wall thickening involving small bowel in the left abdomen, nonspecific enteritis. 2. Mild edema about the pancreatic head and uncinate process. This is favored to be related to duodenal inflammation, however recommend correlation with pancreatic enzymes. 3. Cholelithiasis. Question mild gallbladder enhancement but no definite pericholecystic inflammation. 4. Nonobstructing right renal stone. 5. Enlarged prostate. 6. Stable left adrenal adenoma. Aortic Atherosclerosis (ICD10-I70.0). Electronically Signed   By: Chadwick Colonel M.D.   On: 02/27/2024 22:10     Physical Exam Blood pressure (!) 77/55, pulse (!) 115, temperature 97.6 F (36.4 C), temperature source Oral, resp. rate 19, height 5\' 8"  (  1.727 m), weight 97.5 kg, SpO2 95%. Constitutional: well-developed, well-nourished HEENT: pupils equal, round, reactive to light, moist conjunctiva, hearing intact Oropharynx:  mucous membranes dry CV: Tachycardic, hypotensive on pressors Chest: equal chest rise bilaterally normal respiratory effort on 2L Abdomen: soft, nondistended, nontender  Skin: warm, dry, no rashes Psych: normal memory, normal mood/affect  Neuro: No focal neurologic deficits, A&Ox3    Assessment   JVON MERONEY is an 78 y.o. male who presents with abdominal pain, nausea, emesis, hypotension and imaging concerning for duodenitis, likely gallstone pancreatitis, possible gastroenteritis, and questionable cholecystitis.  Plan  - Agree with CCM admission given critical illness, ICU care per CCM - Agree with broad spectrum antibiotics - NPO and bowel rest in setting of ? Cholecystitis, pancreatitis, duodenitis, gastroenteritis - Hold Eliquis , ok for heparin  gtt - Repeat AM LFTs/lipase, trend lactic acid - Agree with IVF resuscitation - Would consider MRCP. Unable to clearly see CBD on CT. On review of US  the measured clear portion of CBD is within normal limits but poor technically quality to really evaluate entirety. Given clinical picture of sepsis, elevated LFTs, concern is that there may be biliary dilation not clearly seen that would be consistent with cholangitis if present and may need GI consultation and urgent decompression. Clinical picture not completely clear as to whether this is gallstone pancreatitis +/- cholangitis, especially given patient has no pain on my exam and states he feels vastly improved and without clearly seeing CBD dilation on any of obtained images. - Briefly discussed with wife and patient that will need to consider interval cholecystectomy during this hospitalization but will need to wait until more clinical stable and improved from pancreatitis.  I reviewed ED provider notes, Consultant CCM notes, last 24 h vitals and pain scores, last 48 h intake and output, last 24 h labs and trends, and last 24 h imaging results.  I personally spent a total of 85 minutes in  the care of the patient today including preparing to see the patient, getting/reviewing separately obtained history, performing a medically appropriate exam/evaluation, counseling and educating, referring and communicating with other health care professionals, documenting clinical information in the EHR, and independently interpreting results.   Freddrick Jaffe, MD [facility]

## 2024-02-28 NOTE — Progress Notes (Signed)
 PHARMACY - PHYSICIAN COMMUNICATION CRITICAL VALUE ALERT - BLOOD CULTURE IDENTIFICATION (BCID)  Omar [name] is an 78 y.o. male who presented to [facility] on 02/27/2024 with a chief complaint of multiple episodes of emesis and abdominal pain, now complicated by septic shock and concerns for cholecystitis, pancreatitis and/or duodenitis given new CT A/P findings.   Assessment:  4 of 4 bottles positive for Enterococcus faecalis, Escherichia coli, and Klebsiella pneumoniae in the blood with no resistance. Possible GI source of infection. Surgery considering MRCP and deferring cholecystectomy until clinically stable. GI consult to follow.   Name of physician (or Provider) Contacted: Dr. Villa Greaser   Current antibiotics: Cefepime IV 2g q12h  Changes to prescribed antibiotics recommended: Zosyn IV 3.375g x 30 min once, followed by 3.375g IV q8h (4 hour infusion) Recommendations accepted by provider  Results for orders placed or performed during the hospital encounter of 02/27/24  Blood Culture ID Panel (Reflexed) (Collected: 02/27/2024  9:22 PM)  Result Value Ref Range   Enterococcus faecalis DETECTED (A) NOT DETECTED   Enterococcus Faecium NOT DETECTED NOT DETECTED   Listeria monocytogenes NOT DETECTED NOT DETECTED   Staphylococcus species NOT DETECTED NOT DETECTED   Staphylococcus aureus (BCID) NOT DETECTED NOT DETECTED   Staphylococcus epidermidis NOT DETECTED NOT DETECTED   Staphylococcus lugdunensis NOT DETECTED NOT DETECTED   Streptococcus species NOT DETECTED NOT DETECTED   Streptococcus agalactiae NOT DETECTED NOT DETECTED   Streptococcus pneumoniae NOT DETECTED NOT DETECTED   Streptococcus pyogenes NOT DETECTED NOT DETECTED   A.calcoaceticus-baumannii NOT DETECTED NOT DETECTED   Bacteroides fragilis NOT DETECTED NOT DETECTED   Enterobacterales DETECTED (A) NOT DETECTED   Enterobacter cloacae complex NOT DETECTED NOT DETECTED   Escherichia coli DETECTED (A) NOT DETECTED   Klebsiella  aerogenes NOT DETECTED NOT DETECTED   Klebsiella oxytoca NOT DETECTED NOT DETECTED   Klebsiella pneumoniae DETECTED (A) NOT DETECTED   Proteus species NOT DETECTED NOT DETECTED   Salmonella species NOT DETECTED NOT DETECTED   Serratia marcescens NOT DETECTED NOT DETECTED   Haemophilus influenzae NOT DETECTED NOT DETECTED   Neisseria meningitidis NOT DETECTED NOT DETECTED   Pseudomonas aeruginosa NOT DETECTED NOT DETECTED   Stenotrophomonas maltophilia NOT DETECTED NOT DETECTED   Candida albicans NOT DETECTED NOT DETECTED   Candida auris NOT DETECTED NOT DETECTED   Candida glabrata NOT DETECTED NOT DETECTED   Candida krusei NOT DETECTED NOT DETECTED   Candida parapsilosis NOT DETECTED NOT DETECTED   Candida tropicalis NOT DETECTED NOT DETECTED   Cryptococcus neoformans/gattii NOT DETECTED NOT DETECTED   CTX-M ESBL NOT DETECTED NOT DETECTED   Carbapenem resistance IMP NOT DETECTED NOT DETECTED   Carbapenem resistance KPC NOT DETECTED NOT DETECTED   Carbapenem resistance NDM NOT DETECTED NOT DETECTED   Carbapenem resist OXA 48 LIKE NOT DETECTED NOT DETECTED   Vancomycin  resistance NOT DETECTED NOT DETECTED   Carbapenem resistance VIM NOT DETECTED NOT DETECTED    [place]opolis Sloop 02/28/2024  9:23 AM

## 2024-02-28 NOTE — Progress Notes (Signed)
 [facility] ADULT ICU REPLACEMENT PROTOCOL   The patient does apply for the [facility] Adult ICU Electrolyte Replacment Protocol based on the criteria listed below:   1.Exclusion criteria: TCTS, ECMO, Dialysis, and Myasthenia Gravis patients 2. Is GFR >/= 30 ml/min? Yes.    Patient's GFR today is 47 3. Is SCr </= 2? Yes.   Patient's SCr is 1.50 mg/dL 4. Did SCr increase >/= 0.5 in 24 hours? No. 5.Pt's weight >40kg  Yes.   6. Abnormal electrolyte(s): magnesium   7. Electrolytes replaced per protocol 8.  Call MD STAT for K+ </= 2.5, Phos </= 1, or Mag </= 1 Physician:  Dr. Clydell Darnel A Lane Kjos 02/28/2024 4:35 AM

## 2024-02-28 NOTE — Progress Notes (Addendum)
 eLink Physician-Brief Progress Note Patient Name: Omar [name] DOB: 09-25-1946 MRN: [id number]   Date of Service  02/28/2024  HPI/Events of Note  78 y.o. male who presents with abdominal pain, nausea, emesis, hypotension and imaging concerning for duodenitis, likely gallstone pancreatitis and underwent MRCP for evaluation of possible choledocholithiasis in the setting of dual pressor shock.  Notified by radiology about critical findings on MRCP  eICU Interventions  Patient has had a slight improvement in his hemodynamics and is now off vasopressors.  Remains on stress dose steroids.  Will notify gastroenterology of the findings-anticipate the patient will need an ERCP but will defer timing to the GI team.  Surgical team is also on the case, but likely needs endoscopic evaluation prior to surgical re-evaluation -- will notify if any other acute changes occur.  No immediate changes.   2053 -increase melatonin to home dose, n.p.o. after midnight  Intervention Category Intermediate Interventions: Diagnostic test evaluation  Kolbie Lepkowski 02/28/2024, 8:37 PM

## 2024-02-28 NOTE — Progress Notes (Addendum)
 PHARMACY - ANTICOAGULATION CONSULT NOTE  Pharmacy Consult for Heparin  while Eliquis  on hold Indication: Hx DVT  Allergies  Allergen Reactions   Empagliflozin      Patient Measurements: Height: 5\' 8"  (172.7 cm) Weight: 97.5 kg (215 lb) IBW/kg (Calculated) : 68.4 HEPARIN  DW (KG): 89.1  Vital Signs: Temp: 98.3 F (36.8 C) (05/29 0720) Temp Source: Axillary (05/29 0720) BP: 75/61 (05/29 0302) Pulse Rate: 89 (05/29 0630)  Labs: Recent Labs    02/27/24 2054 02/27/24 2240 02/28/24 0230 02/28/24 0523  HGB 17.0  --   --  13.7  HCT 51.4  --   --  40.8  PLT 318  --   --  195  APTT  --   --  142*  --   LABPROT  --   --  22.3*  --   INR  --   --  1.9*  --   CREATININE  --  1.50*  --  1.75*    Estimated Creatinine Clearance: 38.7 mL/min (A) (by C-G formula based on SCr of 1.75 mg/dL (H)).   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 Joleen Navy interpreted the study-CPAP recommended, but refused by patient   Stroke [facility])    no deficits from stroke, "Didnt know I had one when they say I did".    Medications:  Scheduled:   Chlorhexidine  Gluconate Cloth  6 each Topical Daily   hydrocortisone sod succinate (SOLU-CORTEF) inj  100 mg  Intravenous Q12H   insulin  aspart  0-15 Units Subcutaneous TID WC   melatonin  5 mg Oral Once   pantoprazole  (PROTONIX ) IV  40 mg Intravenous Q24H   sodium chloride  flush  10-40 mL Intracatheter Q12H   Infusions:   sodium chloride      heparin  1,500 Units/hr (02/28/24 0856)   lactated ringers  150 mL/hr at 02/28/24 0918   norepinephrine (LEVOPHED) Adult infusion 16 mcg/min (02/28/24 0659)   piperacillin-tazobactam (ZOSYN)  IV     potassium chloride  10 mEq (02/28/24 1031)   vasopressin 0.03 Units/min (02/28/24 0600)   PRN: docusate sodium , polyethylene glycol, sodium chloride  flush, sodium chloride  flush  Assessment: 78 yo male on chronic Eliquis  therapy of hx of Factor V mutation and DVT. Pharmacy consulted to transition to IV heparin  while NPO. Last dose of Eliquis  taken 5/27 PM. Will utilize aPTT to monitor heparin  as heparin  levels will be falsely elevated due to recent DOAC use.  Initial HL therapeutic (0.53) and subtherapeutic aPTT (38) on infusion running at 1500 units/hr. Per RN, heparin  infusion was held at ~0430 and night shift was instructed to hold heparin . Infusion restarted at 0856 after confirming  with CCM MD this am. No signs or symptoms of bleeding noted. Will continue current rate and order levels from time of resumption for assessment of rate.    Goal of Therapy:  Heparin  level 0.3-0.7 units/ml aPTT 66-102 seconds Monitor platelets by anticoagulation protocol: Yes   Plan:  Continue heparin  infusion to 1500 units/hr  Check aPTT and HL in 8hrs and daily until levels correlate, then can monitor using HL only Daily CBC, monitor for signs/symptoms of bleeding  Harvest Lineman, PharmD PGY1 Pharmacy Resident

## 2024-02-28 NOTE — Consult Note (Addendum)
 Referring Provider: Dr. Celene Coins Primary Care Physician:  Tobi Fortes, MD Primary Gastroenterologist: Para Bold, previously Dr. Belvia Boyer  Reason for Consultation: Acute pancreatitis  HPI: Omar [name] is a 78 y.o. 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  He presented to the ED 02/27/2024 with generalized central abdominal pain with nausea and vomiting x 1 day. Admitted with septic shock. Labs in the ED showed a WBC count of 19.3. Hg 17.  Platelets 318.  BUN 19.  Creatinine 1.50.  Total bili 3.0.  Alk phos 141.  AST 329.  ALT 262.  Lipase 1537.  Lactic acid 9, repeat level 6.6.  Blood cultures positive for Enterococcus faecalis, Enterobacter oralis, E. Coli and Klebsiella pneumoniae. Chest/abdominal/pelvic CTA 02/27/2024 identified fat stranding with wall thickening to the 3rd and 4th portion of the duodenum concerning for duodenitis, possible peptic ulcer disease, mild wall thickening to the small bowel in the left abdomen, mild edema to the pancreatic head and ascending process likely secondary to duodenal inflammation, cholelithiasis without pericholecystic inflammation and the CBD was not well defined. RUQ sonogram showed cholelithiasis without acute cholecystitis and no evidence of biliary ductal dilatation. He was admitted to the ICU and started on aggressive IV fluids and antibiotics. On Solucortef (stress steroids). He was started on Levo and Vasopressin and IV antibiotics. Transferred the ICU.    Labs today: WBC 26.3.  Hemoglobin 13.7.  Hematocrit 40.8.  Platelet 195.  Potassium 3.4.  BUN 24.  Creatinine 1.75.  Total bili 3.1.  Alk phos 115.  AST 257.  ALT 249.  Lipase 1292.  Amylase 1260.  Lactic acid 2.6.  INR 1.9.  A GI consult was requested for further evaluation regarding possible gallstone  pancreatitis.   He developed central abdominal pain yesterday which radiated across his mid abdomen and vomited nonbloody emesis x 2.  His abdominal pain persisted and he presented to the ED for further evaluation.  He vomited x 1 in the ED without further recurrence.  He had a few episodes of nonbloody diarrhea yesterday, the third bowel movement was solid.  No prior known history of gallstones or pancreatitis.  No alcohol use.  No GERD symptoms.  He underwent an EGD by Dr. Horatio Lynch approximately 5 years ago, he does not recall why the EGD was done. No history of significant GERD or ulcers. Records in [facility] showed he has a history of Barrett's esophagus per EGD 04/2019. He has a history of colon polyps and underwent 2 colonoscopies in his lifetime. His most recent colonoscopy was 5 years ago by Dr. Nolene Baumgarten and his wife recalled a very large polyp was removed from the colon.  See colonoscopy 04/16/2019 report below.  He denies having any further colonoscopies since then.  No known family history of esophageal, gastric, colon, liver or pancreatic cancer.  He is on Semaglutide for diabetes.  He is on Eliquis  secondary to past DVT, last dose was taken Tuesday 5/27 around 9 or 10 PM.  On Heparin  infusion.  He was seen by general surgery, to consider interval cholecystectomy during this hospitalization when his clinical status is stable and pancreatitis improved.  GI PROCEDURES:  Cologuard 01/2019: Positive  EGD 04/16/2019 by Dr. Horatio Lynch. Normal esophagus Mild gastritis Duodenitis A single gastric polyp Mucosal nodule in the duodenum. Stomach, biopsy, and polyp - GASTRIC  ANTRAL MUCOSA WITH MILD REACTIVE GASTROPATHY. - GASTRIC OXYNTIC MUCOSA WITH MILD CHRONIC GASTRITIS. - FUNDIC GLAND POLYP. - WARTHIN-STARRY STAIN IS NEGATIVE FOR HELICOBACTER PYLORI.   Colonoscopy 04/16/2019 by Dr. Raynelle Callow fields: 14 sessile polyps were found in the rectum, sigmoid colon descending colon transverse colon, hepatic  flexure and ascending colon.  The polyps were 4 to 12 mm in size. A 15 mm polyp was found in the sigmoid colon which was removed and 1 hemostatic clip was placed.  1. Colon, polyp(s), ascending, hepatic flexure - TUBULAR ADENOMA(S), NEGATIVE FOR HIGH GRADE DYSPLASIA. 2. Colon, polyp(s), transverse, descending, sigmoid - TUBULAR ADENOMA(S), NEGATIVE FOR HIGH GRADE DYSPLASIA. 3. Colon, polyp(s), sigmoid - TUBULAR ADENOMA(S), NEGATIVE FOR HIGH GRADE DYSPLASIA. 4. Rectum, polyp(s) - TUBULAR ADENOMA(S), NEGATIVE FOR HIGH GRADE DYSPLASIA. - HYPERPLASTIC POLYP.   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 Joleen Navy interpreted the study-CPAP recommended, but refused by patient   Stroke [facility])    no deficits from stroke, "Didnt know I had one when they say I did".    Past Surgical History:  Procedure Laterality Date   BIOPSY  04/16/2019   Procedure: BIOPSY;  Surgeon: Alyce Jubilee, MD;  Location: [facility];  Service: Endoscopy;;   CATARACT EXTRACTION W/PHACO Left 04/03/2016   Procedure: CATARACT EXTRACTION PHACO AND INTRAOCULAR LENS PLACEMENT LEFT EYE CDE=21.76;  Surgeon: Clay Cummins, MD;  Location: [facility] ORS;  Service: Ophthalmology;  Laterality: Left;    CATARACT EXTRACTION W/PHACO Right 06/19/2016   Procedure: CATARACT EXTRACTION PHACO AND INTRAOCULAR LENS PLACEMENT; CDE:  11.56;  Surgeon: Clay Cummins, MD;  Location: [facility] OR[facility];  Service: Ophthalmology;  Laterality: Right;   COLONOSCOPY N/A 04/16/2019   Procedure: COLONOSCOPY;  Surgeon: Alyce Jubilee, MD;  Location: [facility];  Service: Endoscopy;  Laterality: N/A;  8:30am   COLONOSCOPY W/ POLYPECTOMY  05/2008   polypectomy x4-adenomatous; internal hemorrhoids   CORONARY ANGIOPLASTY WITH STENT PLACEMENT  06/04/2012     DES    to [place]   CORONARY STENT PLACEMENT     x 1   DUPUYTREN / PALMAR FASCIOTOMY  2009   rt hand-dsc-went home same day x2   ESOPHAGOGASTRODUODENOSCOPY N/A 04/16/2019   Procedure: ESOPHAGOGASTRODUODENOSCOPY (EGD);  Surgeon: Alyce Jubilee, MD;  Location: [facility];  Service: Endoscopy;  Laterality: N/A;   LEFT HEART CATHETERIZATION WITH CORONARY ANGIOGRAM N/A 05/28/2012   Procedure: LEFT HEART CATHETERIZATION WITH CORONARY ANGIOGRAM;  Surgeon: Jessica Morn, MD;  Location: [facility] CATH LAB;  Service: Cardiovascular;  Laterality: N/A;   PERCUTANEOUS CORONARY STENT INTERVENTION (PCI-S) N/A 06/04/2012   Procedure: PERCUTANEOUS CORONARY STENT INTERVENTION (PCI-S);  Surgeon: Jessica Morn, MD;  Location: [facility] CATH LAB;  Service: Cardiovascular;  Laterality: N/A;   POLYPECTOMY  04/16/2019   Procedure: POLYPECTOMY;  Surgeon: Alyce Jubilee, MD;  Location: [facility];  Service: Endoscopy;;   TONSILLECTOMY     TOTAL HIP ARTHROPLASTY Right 06/24/2019   Procedure: TOTAL HIP ARTHROPLASTY;  Surgeon: Osa Blase, MD;  Location: [facility] ORS;  Service: Orthopedics;  Laterality: Right;   VENTRAL HERNIA REPAIR  2010   umb hernia-[facility]-went home same day    Prior to Admission medications   Medication Sig Start Date End Date Taking? Authorizing Provider  acebutolol  (SECTRAL ) 200 MG capsule Take 1 capsule (200 mg total) by mouth 2 (two) times daily. 12/14/23  Yes Knox Perl, MD   acetaminophen  (TYLENOL ) 500 MG tablet Take 1,000 mg by mouth every 6 (six) hours as needed.   Yes [provider]  apixaban  (ELIQUIS ) 5 MG TABS tablet Take 5 mg by mouth 2 (two) times daily.   Yes [provider]  HYDROcodone-acetaminophen  (NORCO/VICODIN) 5-325 MG tablet Take by mouth. 01/23/24  Yes [provider]  lisinopril  (ZESTRIL ) 5 MG tablet TAKE (1) TABLET BY MOUTH ONCE DAILY. 12/06/23  Yes Tobi Fortes, MD  metFORMIN  (GLUCOPHAGE ) 500 MG tablet Take 1 tablet (500 mg total) by mouth 2 (two) times daily with a meal. 09/04/23  Yes Tobi Fortes, MD  Multiple Vitamins-Minerals (ONE-A-DAY 50 PLUS PO) Take 1 tablet by mouth at bedtime.    Yes [provider]  nitroGLYCERIN  (NITROSTAT ) 0.4 MG SL tablet PLACE 1 TAB UNDER TONGUE EVERY 5 MIN IF NEEDED FOR CHEST PAIN. MAY USE 3 TIMES.NO RELIEF CALL 911. 08/15/23  Yes Knox Perl, MD  rosuvastatin  (CRESTOR ) 40 MG tablet Take 1 tablet (40 mg total) by mouth daily. Patient taking differently: Take 40 mg by mouth at bedtime. 07/06/23  Yes Tobi Fortes, MD  Semaglutide,0.25 or 0.5MG /DOS, 2 MG/3ML SOPN Inject 2 mg into the skin once a week. Inject 2mg  into skin on Sunday   Yes [provider]  spironolactone  (ALDACTONE ) 50 MG tablet TAKE 1 TABLET BY MOUTH EVERY MORNING 08/01/23  Yes Tobi Fortes, MD  tamsulosin  (FLOMAX ) 0.4 MG CAPS capsule Take 0.4 mg by mouth. 08/31/23  Yes [provider]  torsemide  (DEMADEX ) 20 MG tablet Take 20 mg by mouth daily as needed (swelling). 12/19/16  Yes [provider]  verapamil  (CALAN -SR) 240 MG CR tablet TAKE ONE TABLET BY MOUTH ONCE DAILY AT BEDTIME 11/06/23  Yes Tobi Fortes, MD    Current Facility-Administered Medications  Medication Dose Route Frequency Provider Last Rate Last Admin   0.9 %  sodium chloride  infusion  250 mL Intravenous Continuous Payne, John D, PA-C       Chlorhexidine  Gluconate Cloth 2 % PADS 6 each  6 each Topical Daily Claven Cumming, MD   6 each at 02/28/24 0115   docusate sodium  (COLACE) capsule 100 mg  100 mg Oral BID PRN Payne, John D, PA-C       heparin  ADULT infusion 100 units/mL (25000 units/250mL)  1,500 Units/hr Intravenous Continuous Baldemar Lev, RPH 15 mL/hr at 02/28/24 0856 1,500 Units/hr at 02/28/24 0856   hydrocortisone sodium succinate (SOLU-CORTEF) 100 MG injection 100 mg  100 mg Intravenous Q12H Alva, Rakesh V, MD   100 mg at 02/28/24 0908   insulin  aspart (novoLOG ) injection 0-15 Units  0-15 Units Subcutaneous TID WC Alva, Rakesh V, MD       lactated ringers  infusion  Intravenous Continuous Flonnie Humphrey, DO 150 mL/hr at 02/28/24 2841 New Bag at 02/28/24 0918   melatonin tablet 5 mg  5 mg Oral Once Rexann Catalan, MD       norepinephrine  (LEVOPHED ) 4mg  in (0.016 mg/mL) premix infusion  0-40 mcg/min Intravenous Titrated Casimiro Cleaves, PA-C 45 mL/hr at 02/28/24 1103 12 mcg/min at 02/28/24 1103   pantoprazole  (PROTONIX ) injection 40 mg  40 mg Intravenous Q24H Payne, John D, PA-C   40 mg at 02/28/24 0200   piperacillin -tazobactam (ZOSYN ) IVPB 3.375 g  3.375 g Intravenous Q8H Alva, Rakesh V, MD       polyethylene glycol (MIRALAX  / GLYCOLAX ) packet 17 g  17 g Oral Daily PRN Payne, John D, PA-C       potassium chloride  10 mEq in 50 mL *CENTRAL LINE* IVPB  10 mEq Intravenous Q1 Hr x 4 Lind Repine, MD 50 mL/hr at 02/28/24 1031 10 mEq at 02/28/24 1031   sodium chloride  flush (NS) 0.9 % injection 10-40 mL  10-40 mL Intracatheter PRN Claven Cumming, MD       sodium chloride  flush (NS) 0.9 % injection 10-40 mL  10-40 mL Intracatheter Q12H Claven Cumming, MD   30 mL at 02/28/24 3244   sodium chloride  flush (NS) 0.9 % injection 10-40 mL  10-40 mL Intracatheter PRN Claven Cumming, MD       vasopressin  (PITRESSIN) 20 Units in 100 mL (0.2 unit/mL) infusion-*FOR SHOCK*  0-0.03 Units/min Intravenous Continuous Claven Cumming, MD 9 mL/hr at 02/28/24 0600 0.03 Units/min at 02/28/24 0600    Allergies as of  02/27/2024 - Review Complete 02/27/2024  Allergen Reaction Noted   Empagliflozin   08/18/2022    Family History  Problem Relation Age of Onset   Cancer Mother    Brain cancer Mother    Cancer Father    Prostate cancer Father    Breast cancer Daughter    Thyroid  cancer Daughter    Coronary artery disease Neg Hx    Colon cancer Neg Hx     Social History   Socioeconomic History   Marital status: Married    Spouse name: Not on file   Number of children: 2   Years of education: Not on file   Highest education level: Not on file  Occupational History   Occupation: Airline pilot    Comment: Doctor, general practice  Tobacco Use   Smoking status: Never   Smokeless tobacco: Never  Vaping Use   Vaping status: Never Used  Substance and Sexual Activity   Alcohol use: No   Drug use: No   Sexual activity: Not Currently    Birth control/protection: None  Other Topics Concern   Not on file  Social History Narrative   Married for 54 yrs,lives with wife.Works  part time at [employer].   Social Drivers of Corporate investment banker Strain: Not on file  Food Insecurity: Not on file  Transportation Needs: Not on file  Physical Activity: Not on file  Stress: Not on file  Social Connections: Not on file  Intimate Partner Violence: Not on file   Review of Systems: Gen: Denies fever, sweats or chills. No weight loss.  CV: Denies chest pain, palpitations or edema. Resp: Denies cough, shortness of breath of hemoptysis.  GI: See HPI. GU : Denies urinary burning, blood in urine, increased urinary frequency or incontinence. MS: Denies joint pain, muscles aches or weakness. Derm: Denies rash, itchiness, skin lesions or unhealing ulcers. Psych:  Denies depression, anxiety, memory loss or confusion. Heme: Denies easy bruising, bleeding. Neuro:  Denies headaches, dizziness or paresthesias. Endo:  + DM type II. Physical Exam: Vital signs in last 24 hours: Temp:  [97.6 F  (36.4 C)-99.9 F (37.7 C)] 98.3 F (36.8 C) (05/29 0720) Pulse Rate:  [57-133] 89 (05/29 0630) Resp:  [18-29] 25 (05/29 0630) BP: (61-121)/(41-80) 75/61 (05/29 0302) SpO2:  [88 %-96 %] 94 % (05/29 0630) Arterial Line BP: (57-103)/(33-68) 100/68 (05/29 0630) Weight:  [97.5 kg] 97.5 kg (05/28 2032) Last BM Date :  (PTA) General: Alert critically ill-appearing 78 year old male in no acute distress. Head:  Normocephalic and atraumatic. Eyes: Scant scleral icterus. Conjunctiva pink. Ears:  Normal auditory acuity. Nose:  No deformity, discharge or lesions. Mouth:  Dentition intact. No ulcers or lesions.  Neck:  Supple. No lymphadenopathy or thyromegaly.  Lungs: Breath sounds clear throughout. No wheezes, rhonchi or crackles.  Heart: Regular rate and rhythm, no murmurs. Abdomen: Protuberant.  Tenderness to the RUQ without rebound or guarding.  Positive bowel sounds to all 4 quadrants.  No palpable mass.  No hepatosplenomegaly. Rectal: Deferred. Musculoskeletal:  Symmetrical without gross deformities.  Pulses:  Normal pulses noted. Extremities: Trace lower extremity edema. Neurologic:  Alert and  oriented x 4. No focal deficits.  Skin:  Intact without significant lesions or rashes. Psych:  Alert and cooperative. Normal mood and affect.  Intake/Output from previous day: 05/28 0701 - 05/29 0700 In: 2536.8 [I.V.:447.8; IV Piggyback:2088.9] Out: -  Intake/Output this shift: Total I/O In: 30 [I.V.:30] Out: -   Lab Results: Recent Labs    02/27/24 2054 02/28/24 0523  WBC 19.3* 26.3*  HGB 17.0 13.7  HCT 51.4 40.8  PLT 318 195   BMET Recent Labs    02/27/24 2240 02/28/24 0523  NA 139 140  K 3.8 3.4*  CL 108 107  CO2 18* 19*  GLUCOSE 157* 178*  BUN 19 24*  CREATININE 1.50* 1.75*  CALCIUM  8.3* 8.6*   LFT Recent Labs    02/28/24 0523  PROT 4.6*  ALBUMIN 2.4*  AST 257*  ALT 249*  ALKPHOS 115  BILITOT 3.1*   PT/INR Recent Labs    02/28/24 0230  LABPROT 22.3*   INR 1.9*   Hepatitis Panel No results for input(s): "HEPBSAG", "HCVAB", "HEPAIGM", "HEPBIGM" in the last 72 hours.    Studies/Results: DG CHEST PORT 1 VIEW Result Date: 02/28/2024 CLINICAL DATA:  [id number].  Encounter for central line placement. EXAM: PORTABLE CHEST 1 VIEW COMPARISON:  Portable chest 05/19/2022, CTA chest yesterday at 9:31 p.m. FINDINGS: 4:49 a.m. left IJ central line terminates about the superior cavoatrial junction with no visible pneumothorax. The heart is slightly enlarged. The mediastinum is stable with aortic atherosclerosis. Central vascular prominence is present without edema. The lungs are hypoinflated but generally clear. No new osseous findings.  Slight thoracic levoscoliosis. IMPRESSION: 1. Left IJ central line terminates about the superior cavoatrial junction with no visible pneumothorax. 2. Hypoinflation with central vascular prominence but no edema. 3. Aortic atherosclerosis. Electronically Signed   By: Denman Fischer M.D.   On: 02/28/2024 05:31   US  Abdomen Limited RUQ (LIVER/GB) Result Date: 02/28/2024 CLINICAL DATA:  Epigastric pain. EXAM: ULTRASOUND ABDOMEN LIMITED RIGHT UPPER QUADRANT COMPARISON:  None Available. FINDINGS: Gallbladder: Shadowing, echogenic gallstones are seen within the gallbladder lumen. The largest measures approximately 2.2 cm. The gallbladder wall measures 3.8 mm in thickness. No sonographic Murphy sign noted by sonographer. Common bile duct: Diameter: 5.7 mm Liver: No focal  lesion identified. Diffusely decreased echogenicity of the liver parenchyma is noted. Portal vein is patent on color Doppler imaging with normal direction of blood flow towards the liver. Other: Technically limited study secondary to limited patient positioning, as per the ultrasound technologist. IMPRESSION: 1. Cholelithiasis without evidence of acute cholecystitis. Electronically Signed   By: Virgle Grime M.D.   On: 02/28/2024 00:35   CT Angio Chest/Abd/Pel for  Dissection W and/or W/WO Result Date: 02/27/2024 CLINICAL DATA:  Aortic aneurysm suspected Abdominal pain and distension.  Diaphoretic. EXAM: CT ANGIOGRAPHY CHEST, ABDOMEN AND PELVIS TECHNIQUE: Non-contrast CT of the chest was initially obtained. Multidetector CT imaging through the chest, abdomen and pelvis was performed using the standard protocol during bolus administration of intravenous contrast. Multiplanar reconstructed images and MIPs were obtained and reviewed to evaluate the vascular anatomy. RADIATION DOSE REDUCTION: This exam was performed according to the departmental dose-optimization program which includes automated exposure control, adjustment of the mA and/or kV according to patient size and/or use of iterative reconstruction technique. CONTRAST:  OMNIPAQUE IOHEXOL 350 MG/ML SOLN COMPARISON:  Noncontrast abdominopelvic CT 08/15/2019 FINDINGS: CTA CHEST FINDINGS Cardiovascular: No aortic hematoma on unenhanced exam. Diffuse aortic atherosclerosis and tortuosity without acute aortic findings. No aortic dissection. 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 There is no central pulmonary embolus to the proximal segmental level. The main pulmonary artery is dilated at 3.8 cm. No pericardial effusion. Coronary artery calcifications. Mediastinum/Nodes: No mediastinal or hilar adenopathy. Unremarkable appearance of the esophagus. No visible thyroid  nodule. Lungs/Pleura: Subsegmental atelectasis in the dependent lower lobes. No confluent airspace disease. No pleural fluid. No features of pulmonary edema. Calcified granuloma in the right lower lobe, series 8, image 77, benign needing no further imaging follow-up. Musculoskeletal: Thoracic spondylosis. There are no acute or suspicious osseous abnormalities. Mild bilateral gynecomastia. Review of the MIP images confirms the above findings. CTA ABDOMEN AND PELVIS FINDINGS VASCULAR Aorta: Normal caliber aorta without aneurysm,  dissection, vasculitis or significant stenosis. Moderate atherosclerosis. Celiac: Patent without evidence of aneurysm, dissection, vasculitis or significant stenosis. SMA: Patent without evidence of aneurysm, dissection, vasculitis or significant stenosis. Renals: 2 right and single left renal arteries are all patent. Renal arteries are patent without evidence of aneurysm, dissection, vasculitis, fibromuscular dysplasia or significant stenosis. IMA: Patent without evidence of aneurysm, dissection, vasculitis or significant stenosis. Inflow: Patent without evidence of aneurysm, dissection, vasculitis or significant stenosis. Moderate atherosclerosis and tortuosity. Veins: No obvious venous abnormality within the limitations of this arterial phase study. Review of the MIP images confirms the above findings. NON-VASCULAR Hepatobiliary: No focal liver abnormality. There are multiple intraluminal gallstones. Question mild gallbladder enhancement but no definite pericholecystic inflammation. Common bile duct is not well-defined on the current exam. Pancreas: Mild edema about the pancreatic head and uncinate process. No ductal dilatation. Spleen: Normal in size without focal abnormality. Adrenals/Urinary Tract: Low-density 2.6 cm left adrenal nodule is unchanged from prior exam and consistent with adenoma. The right adrenal gland is normal. Bilateral renal parenchymal thinning. No hydronephrosis. Nonobstructing 5 mm right renal stone. Partially distended urinary bladder. Stomach/Bowel: The stomach is moderately distended with fluid, tiny hiatal hernia. There is wall thickening in the pre pyloric stomach, series 7, image 131 with question of faint adjacent fat stranding. Fat stranding and wall thickening about the third and fourth portion of the duodenum, for example series 5, image 57. Chronic small duodenal diverticulum. Mild small bowel wall thickening involving bowel loops in the left abdomen. No obstruction. Moderate  volume of  stool in the colon. Sigmoid colon is redundant. Lymphatic: Prominent periportal and upper mesenteric nodes. Reproductive: Enlarged prostate spans 5.4 cm. Other: No free air.  No ascites. Musculoskeletal: Right hip arthroplasty. Diffuse lumbar degenerative change. There are no acute or suspicious osseous abnormalities. Review of the MIP images confirms the above findings. IMPRESSION: 1. Fat stranding and wall thickening about the third and fourth portion of the duodenum, suspicious for duodenitis. There is also wall thickening in the pre pyloric stomach with question of faint adjacent fat stranding, may represent peptic ulcer disease. Additional mild wall thickening involving small bowel in the left abdomen, nonspecific enteritis. 2. Mild edema about the pancreatic head and uncinate process. This is favored to be related to duodenal inflammation, however recommend correlation with pancreatic enzymes. 3. Cholelithiasis. Question mild gallbladder enhancement but no definite pericholecystic inflammation. 4. Nonobstructing right renal stone. 5. Enlarged prostate. 6. Stable left adrenal adenoma. Aortic Atherosclerosis (ICD10-I70.0). Electronically Signed   By: Chadwick Colonel M.D.   On: 02/27/2024 22:10    IMPRESSION/PLAN:  78 year old male admitted with generalized central abdominal pain secondary to acute pancreatitis with sepsis. Chest/abdominal/pelvic CTA 02/27/2024 duodenitis, possible peptic ulcer disease without evidence of perforation, mild wall thickening to the small bowel in the left abdomen, mild edema to the pancreatic head and ascending process likely secondary to duodenal inflammation, cholelithiasis without pericholecystic inflammation and the CBD was not well-defined. RUQ sonogram showed cholelithiasis without acute cholecystitis and no evidence of biliary ductal dilatation. Significant leukocytosis. Elevated LFTs and lipase level. Seen by general surgery, to consider interval  cholecystectomy during this hospitalization when his clinical status is stable and pancreatitis improved. ID consulted for polymicrobial septic shock, on Cefepime, Zosyn and Flagyl  IV. Clinical presentation concerning for gallstone/biliary pancreatitis with suspected ascending cholangitis +/- cholecystitis.  - Clear liquid diet - Abdominal MRI/MRCP to rule out choledocholithiasis when hemodynamically stable, patient remains on dual pressors - IV antibiotics per ID - Pantoprazole  40 mg IV daily - Await further recommendations per Dr. Dominic Friendly  AKI -IV fluids per CCM  History of CAD status post stent 2013  History of lower extremity DVT on Eliquis  at home.  Last dose of Eliquis  was 5/27.  On Heparin  infusion.  History of Barrett's esophagus per EGD 04/16/2019 -PPI IV daily  History of colon polyps. Colonoscopy 04/16/2019 identified 15 polyps removed from the colon and rectum, the largest polyp measured 15 mm was removed from the sigmoid colon.  DM type II   Tory Freiberg  02/28/2024, 12:54 PM   I have taken an interval history, thoroughly reviewed the chart and examined the patient. I agree with the Advanced Practitioner's note, impression and recommendations, and have recorded additional findings, impressions and recommendations below. I performed a substantive portion of this encounter (>50% time spent), including a complete performance of the medical decision making.  My additional thoughts are as follows:  Alert and conversational, looks comfortable.  Still requiring pressors, moderate RUQ tenderness without rebound.  Admitted with acute onset upper abdominal pain, polymicrobial sepsis, pancreatitis gallstones and inflammatory process either in or adjacent to the duodenum and pancreas. Overall clinical picture favors gallstone pancreatitis with additional concern for possible cholecystitis, possible choledocholithiasis with cholangitis. Normal alkaline phosphatase, lack of  biliary ductal dilatation on ultrasound (CBD not seen well on CTAP), and bilirubin under 4 give a moderate clinical suspicion for choledocholithiasis, which leads to a recommendation for an MRCP as next diagnostic test.  He would be high risk for ERCP given his sepsis requiring vasopressors,  coagulopathy on admission, so we hope for more clarity on what is happening with the bile duct and gallbladder to know if a therapeutic ERCP is warranted.  All explained in detail to him with both his wife and daughter at the bedside.  He expresses concerns about the MRI due to his claustrophobia.  Understandable, and hopefully we can get him through that test with some sedation if feasible since that is likely to be our best chance at further diagnosis. He does not have intrarectal hepatic biliary ductal dilatation on imaging, so presently would likely not be feasible to do any percutaneous drainage of the bile duct, no definite cholecystitis findings are enlarged gallbladder on imaging other than perhaps some subtle wall thickening. Imaging finding of inflammation in distal stomach/duodenum also of unclear cause, I suspect this more likely reactive to the pancreatitis and biliary processes rather than representative of a complication from an ulcer, particular given the acuity with which the symptoms came on.  No free air around that area or air in the portal venous system to suggest perforation.  Will communicate with primary care about the plans, will follow closely.  Kerby Pearson III Office:[phone]

## 2024-02-28 NOTE — Progress Notes (Signed)
 PHARMACY - ANTICOAGULATION CONSULT NOTE  Pharmacy Consult for Heparin  while Eliquis  on hold Indication: Hx DVT  Allergies  Allergen Reactions   Empagliflozin      Patient Measurements: Height: 5\' 8"  (172.7 cm) Weight: 97.5 kg (215 lb) IBW/kg (Calculated) : 68.4 HEPARIN  DW (KG): 89.1  Vital Signs: Temp: 97.7 F (36.5 C) (05/29 1521) Temp Source: Oral (05/29 1521) Pulse Rate: 78 (05/29 1730)  Labs: Recent Labs    02/27/24 2054 02/27/24 2240 02/28/24 0230 02/28/24 0523 02/28/24 0949 02/28/24 1711  HGB 17.0  --   --  13.7  --   --   HCT 51.4  --   --  40.8  --   --   PLT 318  --   --  195  --   --   APTT  --   --  142*  --  38* 162*  LABPROT  --   --  22.3*  --   --   --   INR  --   --  1.9*  --   --   --   HEPARINUNFRC  --   --   --   --  0.53 >1.10*  CREATININE  --  1.50*  --  1.75*  --   --     Estimated Creatinine Clearance: 38.7 mL/min (A) (by C-G formula based on SCr of 1.75 mg/dL (H)).   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 Joleen Navy interpreted the study-CPAP recommended, but refused by patient   Stroke [facility])    no deficits from stroke, "Didnt know I had one when they say I did".    Medications:   Scheduled:   Chlorhexidine  Gluconate Cloth  6 each Topical Daily   hydrocortisone sod succinate (SOLU-CORTEF) inj  100 mg Intravenous Q12H   insulin  aspart  0-15 Units Subcutaneous TID WC   melatonin  5 mg Oral Once   pantoprazole  (PROTONIX ) IV  40 mg Intravenous Q24H   sodium chloride  flush  10-40 mL Intracatheter Q12H   Infusions:   sodium chloride      heparin  1,500 Units/hr (02/28/24 1700)   norepinephrine (LEVOPHED) Adult infusion 3 mcg/min (02/28/24 1700)   piperacillin-tazobactam (ZOSYN)  IV 12.5 mL/hr at 02/28/24 1700   vasopressin 0.01 Units/min (02/28/24 1700)   PRN: docusate sodium , gadobutrol, midazolam , polyethylene glycol, sodium chloride  flush, sodium chloride  flush  Assessment: 78 yo male on chronic Eliquis  therapy of hx of Factor V mutation and DVT. Pharmacy consulted to transition to IV heparin  while NPO. Last dose of Eliquis  taken 5/27 PM. Will utilize aPTT to monitor heparin  as heparin  levels will be falsely elevated  due to recent DOAC use.  Heparin  level and aPTT are both supra-therapeutic.  Lab drawn appropriately per discussion with RN; no bleeding reported.  Patient's IV heparin  has been off for about an hour when he was off the floor for MRI.  Goal of Therapy:  Heparin  level 0.3-0.7 units/ml aPTT 66-102 seconds Monitor platelets by anticoagulation protocol: Yes   Plan:  Resume IV heparin  at a reduced rate of 1150 units/hr  F/U AM labs  Shalom Ware D. Marikay Show, PharmD, BCPS, BCCCP 02/28/2024, 8:23 PM

## 2024-02-28 NOTE — Progress Notes (Addendum)
 eLink Physician-Brief Progress Note Patient Name: Omar [name] DOB: 15-Apr-1946 MRN: [id number]   Date of Service  02/28/2024  HPI/Events of Note  eICU Brief new admit note:  78 y/o male who has a PMH for DVT on Eliquis , DMT2, HTN, CAD s/p 1 coronary stent, Factor V Mutation, HLD, Obesity who was in his usual state of health until he ate pork skins and sun drops. Now in ICU for  Septic shock from Gastroenteritis/food posining, Duodenitis, pancreatitis with lactic acidosis. AKI. Elevated transaminitis .   Data: CT scan abdomen showed: IMPRESSION: 1. Fat stranding and wall thickening about the third and fourth portion of the duodenum, suspicious for duodenitis. There is also wall thickening in the pre pyloric stomach with question of faint adjacent fat stranding, may represent peptic ulcer disease. Additional mild wall thickening involving small bowel in the left abdomen, nonspecific enteritis. 2. Mild edema about the pancreatic head and uncinate process. This is favored to be related to duodenal inflammation, however recommend correlation with pancreatic enzymes. 3. Cholelithiasis. Question mild gallbladder enhancement but no definite pericholecystic inflammation. 4. Nonobstructing right renal stone. 5. Enlarged prostate. 6. Stable left adrenal adenoma. No mention of CBD dilatation or stone.     USG: GB stones, no cholecystitis.limited but no mention of CBD dilatation or stone.   Cr 1.5 Co2 at 18 Lipase 1537 AST/ALT: 329/262 ALP 141 LA 6.6 UA neg, hg +. RBC > 50.  EF 50% or more from 2013.  Camera: Sinus tachy at 110 to 115. Sats on nasal o2 95%. MAP 013. Awake and alert and oriented x 3. Denies any belly pain. Soft on palpation per RN discussion. Asking for some ice chips for dry mouth.he feeling better.  On heparin  drip. LR at 150 ml/hr.    eICU Interventions  S/p over 3 lit fluids and on vanc, cefipime. Metronidazole  Follow LA Ok to have once ice chips trial  . Otherwise will keep NPO. Aspiration precautions. VTE on heparin  drip. On PPI On SSI, goals < 180. Aspiration precautions. Hepatitis to consider, but hx is very short and happened post pork eating. Food poisoning like.       Intervention Category Major Interventions: Respiratory failure - evaluation and management;Sepsis - evaluation and management Evaluation Type: New Patient Evaluation  Rexann Catalan 02/28/2024, 1:56 AM   02:22 Bedside RN says patient is asking for melatonin. He takes 10mg  at home to sleep. Surgery stopped by and said that patient could have ice chips. Also sx asking about if we were going to place an arterial line. Can you possibly order one? Bedside RN says that the pressures are inconsistent and he is on 7 of Levo currently through a peripheral. She says she will get one that is 119 and then one that is 70/50. . RT is able to try to place one at bedside, so you wouldn't necessarily have to have ground team come unless his pressor needs go up.   - discussed with RN [facility] for RT to try once, but on heparin  drip, watch for bleeding. If bleeds need at least 15 mins of pressure over radial artery and stopping heparin  for short time. - getting thirs PIV. Possible PICC line in AM if needing continued pressors.   03:45 Bedside RN reports that patient's BP is 81/48 ( 58) on the art line-> requesting ground team to line patient as he is max on his levo through his peripheral.has artrial line.  Notified CCM team. Give LR 500 ml bolus.  04:00 Takes melatonin 10 mg at home. Ordered 5 mg oral once.

## 2024-02-28 NOTE — Progress Notes (Signed)
*  PRELIMINARY RESULTS* Echocardiogram 2D Echocardiogram has been performed.  Omar [name] 02/28/2024, 2:58 PM

## 2024-02-28 NOTE — Progress Notes (Signed)
 Critically ill, on Levophed up to 17 mics and vasopressin. Poor urine output On exam -alert, interactive, nonfocal, soft nontender abdomen, no guarding, S1-S2 regular, no edema.  Labs show high lipase, slight decrease in AST/ALT, bilirubin up to 3.1, mild hypokalemia, BUN/creatinine increased to 24/1.7, increased leukocytosis, lactate decreased to 2.6. Blood cultures surprisingly showing E. coli, Klebsiella and Enterococcus  Impression/plan Polymicrobial GNR septic shock -unclear etiology but highly doubt that this is related to gastroenteritis.  Doubt acute cholecystitis since his exam is not consistent but increased lipase is suspicious.  CT scan showed duodenitis, polymicrobial bacteremia suggests fistula but no such evidence on imaging  - Will add stress dose steroids Obtain CVP and give more fluids if needed. -ID consult, continue cefepime for now. Has been seen by surgery already  AKI -expect to improve as hemodynamics improve. Elevated LFTs -alk phos has normalized, will initiate clear liquids and monitor lipase  My additional critical care time was 35 minutes  Lavon Horn V. Villa Greaser MD

## 2024-02-28 NOTE — Progress Notes (Signed)
 PHARMACY - ANTICOAGULATION CONSULT NOTE  Pharmacy Consult for Heparin  while Eliquis  on hold Indication: Hx DVT  Allergies  Allergen Reactions   Empagliflozin      Patient Measurements: Height: 5\' 8"  (172.7 cm) Weight: 97.5 kg (215 lb) IBW/kg (Calculated) : 68.4 HEPARIN  DW (KG): 89.1  Vital Signs: Temp: 97.6 F (36.4 C) (05/28 2032) Temp Source: Oral (05/28 2032) BP: 69/51 (05/29 0015) Pulse Rate: 120 (05/29 0015)  Labs: Recent Labs    02/27/24 2054 02/27/24 2240  HGB 17.0  --   HCT 51.4  --   PLT 318  --   CREATININE  --  1.50*    Estimated Creatinine Clearance: 45.2 mL/min (A) (by C-G formula based on SCr of 1.5 mg/dL (H)).   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 Joleen Navy interpreted the study-CPAP recommended, but refused by patient   Stroke [facility])    no deficits from stroke, "Didnt know I had one when they say I did".    Medications:  Scheduled:   insulin  aspart  0-9 Units Subcutaneous Q4H   pantoprazole  (PROTONIX ) IV  40 mg Intravenous Q24H   testosterone  cypionate  200 mg Intramuscular Weekly   Infusions:   sodium chloride      ceFEPime (MAXIPIME) IV     lactated ringers      lactated ringers  150 mL/hr at  02/28/24 0047   metronidazole      norepinephrine (LEVOPHED) Adult infusion 3 mcg/min (02/28/24 0050)   vancomycin      PRN: docusate sodium , polyethylene glycol  Assessment: 78 yo male on chronic Eliquis  therapy of hx of Factor V mutation and DVT. Pharmacy consulted to transition to IV heparin  while NPO.  Last dose of Eliquis  taken 5/27 PM. Will utilize aPTT to monitor heparin  as heparin  levels will be falsely elevated due to recent DOAC use.  Goal of Therapy:  Heparin  level 0.3-0.7 units/ml aPTT 66-102 seconds Monitor platelets by anticoagulation protocol: Yes   Plan:  Give 4000 units bolus x 1 Start heparin  infusion at 1500 units/hr Check aPTT and HL in 8hrs and daily until levels correlate, then can monitor using HL only Daily CBC, monitor for signs/symptoms of bleeding  Armanda Bern, PharmD, BCPS 02/28/2024,12:52 AM  Please check AMION for all [facility] Pharmacy phone numbers After 10:00 PM, call Main Pharmacy [phone]

## 2024-02-29 ENCOUNTER — Inpatient Hospital Stay (HOSPITAL_COMMUNITY): Admitting: Anesthesiology

## 2024-02-29 ENCOUNTER — Encounter (HOSPITAL_COMMUNITY)
Admission: EM | Disposition: A | Discharge status: Home / Self Care | Payer: Self-pay | Source: Ambulatory Visit | Attending: Internal Medicine

## 2024-02-29 ENCOUNTER — Inpatient Hospital Stay (HOSPITAL_COMMUNITY)

## 2024-02-29 DIAGNOSIS — K810 Acute cholecystitis: Secondary | ICD-10-CM

## 2024-02-29 DIAGNOSIS — K838 Other specified diseases of biliary tract: Secondary | ICD-10-CM | POA: Diagnosis not present

## 2024-02-29 DIAGNOSIS — K3189 Other diseases of stomach and duodenum: Secondary | ICD-10-CM

## 2024-02-29 DIAGNOSIS — K8050 Calculus of bile duct without cholangitis or cholecystitis without obstruction: Secondary | ICD-10-CM | POA: Diagnosis not present

## 2024-02-29 DIAGNOSIS — E8729 Other acidosis: Secondary | ICD-10-CM

## 2024-02-29 DIAGNOSIS — K5710 Diverticulosis of small intestine without perforation or abscess without bleeding: Secondary | ICD-10-CM

## 2024-02-29 DIAGNOSIS — R7881 Bacteremia: Secondary | ICD-10-CM | POA: Diagnosis not present

## 2024-02-29 DIAGNOSIS — I10 Essential (primary) hypertension: Secondary | ICD-10-CM

## 2024-02-29 DIAGNOSIS — I2510 Atherosclerotic heart disease of native coronary artery without angina pectoris: Secondary | ICD-10-CM

## 2024-02-29 DIAGNOSIS — K8309 Other cholangitis: Secondary | ICD-10-CM

## 2024-02-29 DIAGNOSIS — B961 Klebsiella pneumoniae [K. pneumoniae] as the cause of diseases classified elsewhere: Secondary | ICD-10-CM | POA: Diagnosis not present

## 2024-02-29 DIAGNOSIS — K8070 Calculus of gallbladder and bile duct without cholecystitis without obstruction: Secondary | ICD-10-CM

## 2024-02-29 DIAGNOSIS — B9620 Unspecified Escherichia coli [E. coli] as the cause of diseases classified elsewhere: Secondary | ICD-10-CM | POA: Diagnosis not present

## 2024-02-29 DIAGNOSIS — E782 Mixed hyperlipidemia: Secondary | ICD-10-CM | POA: Diagnosis not present

## 2024-02-29 DIAGNOSIS — B952 Enterococcus as the cause of diseases classified elsewhere: Secondary | ICD-10-CM | POA: Diagnosis not present

## 2024-02-29 HISTORY — PX: ERCP: SHX5425

## 2024-02-29 LAB — PROTIME-INR
INR: 1.6 — ABNORMAL HIGH (ref 0.8–1.2)
Prothrombin Time: 19.4 s — ABNORMAL HIGH (ref 11.4–15.2)

## 2024-02-29 LAB — GASTROINTESTINAL PANEL BY PCR, STOOL (REPLACES STOOL CULTURE)

## 2024-02-29 LAB — COMPREHENSIVE METABOLIC PANEL WITH GFR
ALT: 189 U/L — ABNORMAL HIGH (ref 0–44)
AST: 126 U/L — ABNORMAL HIGH (ref 15–41)
Albumin: 2.3 g/dL — ABNORMAL LOW (ref 3.5–5.0)
Alkaline Phosphatase: 97 U/L (ref 38–126)
Anion gap: 11 (ref 5–15)
BUN: 28 mg/dL — ABNORMAL HIGH (ref 8–23)
CO2: 20 mmol/L — ABNORMAL LOW (ref 22–32)
Calcium: 8.2 mg/dL — ABNORMAL LOW (ref 8.9–10.3)
Chloride: 104 mmol/L (ref 98–111)
Creatinine, Ser: 1.48 mg/dL — ABNORMAL HIGH (ref 0.61–1.24)
GFR, Estimated: 48 mL/min — ABNORMAL LOW (ref >60)
Glucose, Bld: 150 mg/dL — ABNORMAL HIGH (ref 70–99)
Potassium: 3.9 mmol/L (ref 3.5–5.1)
Sodium: 135 mmol/L (ref 135–145)
Total Bilirubin: 1.7 mg/dL — ABNORMAL HIGH (ref 0.0–1.2)
Total Protein: 4.8 g/dL — ABNORMAL LOW (ref 6.5–8.1)

## 2024-02-29 LAB — CBC
HCT: 38.1 % — ABNORMAL LOW (ref 39.0–52.0)
Hemoglobin: 12.8 g/dL — ABNORMAL LOW (ref 13.0–17.0)
MCH: 30.1 pg (ref 26.0–34.0)
MCHC: 33.6 g/dL (ref 30.0–36.0)
MCV: 89.6 fL (ref 80.0–100.0)
Platelets: 129 10*3/uL — ABNORMAL LOW (ref 150–400)
RBC: 4.25 MIL/uL (ref 4.22–5.81)
RDW: 15.9 % — ABNORMAL HIGH (ref 11.5–15.5)
WBC: 29.7 10*3/uL — ABNORMAL HIGH (ref 4.0–10.5)
nRBC: 0 % (ref 0.0–0.2)

## 2024-02-29 LAB — GLUCOSE, CAPILLARY
Glucose-Capillary: 117 mg/dL — ABNORMAL HIGH (ref 70–99)
Glucose-Capillary: 120 mg/dL — ABNORMAL HIGH (ref 70–99)
Glucose-Capillary: 152 mg/dL — ABNORMAL HIGH (ref 70–99)
Glucose-Capillary: 124 mg/dL — ABNORMAL HIGH (ref 70–99)

## 2024-02-29 LAB — APTT: aPTT: 118 s — ABNORMAL HIGH (ref 24–36)

## 2024-02-29 LAB — BILIRUBIN, DIRECT: Bilirubin, Direct: 1 mg/dL — ABNORMAL HIGH (ref 0.0–0.2)

## 2024-02-29 LAB — LIPASE, BLOOD: Lipase: 379 U/L — ABNORMAL HIGH (ref 11–51)

## 2024-02-29 LAB — HEPARIN LEVEL (UNFRACTIONATED): Heparin Unfractionated: 0.87 [IU]/mL — ABNORMAL HIGH (ref 0.30–0.70)

## 2024-02-29 SURGERY — ERCP, WITH INTERVENTION IF INDICATED
Anesthesia: General

## 2024-02-29 MED ORDER — LIDOCAINE HCL (CARDIAC) PF 100 MG/5ML IV SOSY
PREFILLED_SYRINGE | INTRAVENOUS | Status: DC | PRN
Start: 2024-02-29 — End: 2024-02-29
  Administered 2024-02-29: 60 mg via INTRATRACHEAL

## 2024-02-29 MED ORDER — GLUCAGON HCL RDNA (DIAGNOSTIC) 1 MG IJ SOLR
INTRAMUSCULAR | Status: AC
  Filled 2024-02-29: qty 1

## 2024-02-29 MED ORDER — SUGAMMADEX SODIUM 200 MG/2ML IV SOLN
INTRAVENOUS | Status: DC | PRN
  Administered 2024-02-29: 390 mg via INTRAVENOUS

## 2024-02-29 MED ORDER — CIPROFLOXACIN IN D5W 400 MG/200ML IV SOLN
INTRAVENOUS | Status: AC
  Filled 2024-02-29: qty 200

## 2024-02-29 MED ORDER — TAMSULOSIN HCL 0.4 MG PO CAPS
0.4000 mg | ORAL_CAPSULE | Freq: Every day | ORAL | Status: DC
  Administered 2024-02-29 – 2024-03-03 (×4): 0.4 mg via ORAL
  Filled 2024-02-29 (×4): qty 1

## 2024-02-29 MED ORDER — ROCURONIUM BROMIDE 100 MG/10ML IV SOLN
INTRAVENOUS | Status: DC | PRN
  Administered 2024-02-29: 70 mg via INTRAVENOUS

## 2024-02-29 MED ORDER — LACTATED RINGERS IV SOLN: INTRAVENOUS | Status: DC | PRN

## 2024-02-29 MED ORDER — ONDANSETRON HCL 4 MG/2ML IJ SOLN
INTRAMUSCULAR | Status: DC | PRN
  Administered 2024-02-29: 4 mg via INTRAVENOUS

## 2024-02-29 MED ORDER — PROPOFOL 10 MG/ML IV BOLUS
INTRAVENOUS | Status: DC | PRN
  Administered 2024-02-29: 90 mg via INTRAVENOUS
  Administered 2024-02-29: 20 mg via INTRAVENOUS

## 2024-02-29 MED ORDER — GLUCAGON HCL RDNA (DIAGNOSTIC) 1 MG IJ SOLR
INTRAMUSCULAR | Status: DC | PRN
  Administered 2024-02-29: .25 mg via INTRAVENOUS

## 2024-02-29 MED ORDER — PHENYLEPHRINE HCL-NACL 20-0.9 MG/250ML-% IV SOLN
INTRAVENOUS | Status: DC | PRN
  Administered 2024-02-29: 10 ug/min via INTRAVENOUS

## 2024-02-29 MED ORDER — DICLOFENAC SUPPOSITORY 100 MG
RECTAL | Status: AC
  Filled 2024-02-29: qty 1

## 2024-02-29 MED ORDER — FENTANYL CITRATE (PF) 100 MCG/2ML IJ SOLN
INTRAMUSCULAR | Status: AC
  Filled 2024-02-29: qty 2

## 2024-02-29 MED ORDER — FENTANYL CITRATE (PF) 250 MCG/5ML IJ SOLN
INTRAMUSCULAR | Status: DC | PRN
Start: 2024-02-29 — End: 2024-02-29
  Administered 2024-02-29 (×2): 25 ug via INTRAVENOUS

## 2024-02-29 MED ORDER — SODIUM CHLORIDE 0.9 % IV SOLN
INTRAVENOUS | Status: AC | PRN
  Administered 2024-02-29: 1000 mL via INTRAVENOUS

## 2024-02-29 MED ORDER — SODIUM CHLORIDE 0.9 % IV SOLN
INTRAVENOUS | Status: DC | PRN
  Administered 2024-02-29: 15 mL

## 2024-02-29 MED ORDER — INDOMETHACIN 50 MG RE SUPP
100.0000 mg | Freq: Once | RECTAL | Status: AC
  Filled 2024-02-29: qty 2

## 2024-02-29 MED ORDER — HEPARIN (PORCINE) 25000 UT/250ML-% IV SOLN: 1000.0000 [IU]/h | INTRAVENOUS | Status: DC

## 2024-02-29 MED ORDER — INDOMETHACIN 50 MG RE SUPP
RECTAL | Status: DC | PRN
  Administered 2024-02-29: 50 mg via RECTAL

## 2024-02-29 NOTE — Op Note (Addendum)
 [facility] Patient Name: Omar [name] Procedure Date : 02/29/2024 MRN: [id number] Attending MD: Lajuan Pila , MD, [id number] Date of Birth: 05/09/1946 CSN: [id number] Age: 78 Admit Type: Inpatient Procedure:                ERCP Indications:              Bile duct stone(s) on MRCP. Patient admitted with                            biliary pancreatitis and ascending cholangitis. Off                            pressors. Providers:                Lajuan Pila, MD, Suzann Ernst, RN, Nohemi Batters,                            Technician Referring MD:             Dr. Lorella Roles. Medicines:                Monitored Anesthesia Care, patient already on                            Zosyn . Indocin  suppositories. Heparin  was held this                            morning. Glucagon  IV in divided doses. Complications:            No immediate complications. Estimated Blood Loss:     Estimated blood loss was minimal. Procedure:                Pre-Anesthesia Assessment:                           - Prior to the procedure, a History and Physical                            was performed, and patient medications and                            allergies were reviewed. The patient's tolerance of                            previous anesthesia was also reviewed. The risks                            and benefits of the procedure and the sedation                            options and risks were discussed with the patient.                            All questions were answered, and informed consent  was obtained. Prior Anticoagulants: The patient has                            taken heparin , last dose was day of procedure. ASA                            Grade Assessment: III - A patient with severe                            systemic disease. After reviewing the risks and                            benefits, the patient was deemed in satisfactory                             condition to undergo the procedure.                           After obtaining informed consent, the scope was                            passed under direct vision. Throughout the                            procedure, the patient's blood pressure, pulse, and                            oxygen saturations were monitored continuously. The                            [id number]-[id number] ([id number]) Olympus duodenoscope was                            introduced through the mouth, and used to inject                            contrast into and used to inject contrast into the                            bile duct. The ERCP was accomplished with ease. The                            patient tolerated the procedure well. Scope In: Scope Out: Findings:      The scout film was normal. The esophagus was successfully intubated       under direct vision. The scope was advanced to major papilla in the       descending duodenum without detailed examination of the pharynx, larynx       and associated structures, and upper GI tract with some difficulty.       J-shaped stomach. Passage of scope into the duodenum was accomplished in       the left lateral position. The upper GI tract was grossly normal.       Significant duodenal edema was noted likely due to  previous pancreatitis.      1 cm periampullary diverticulum with papillary opening at the lower lip       of diverticulum. A single 4 mm stone was visualized in the duodenum. The       bile duct was deeply cannulated with the short-nosed traction       sphincterotome using a wire-guided approach on first attempt. Contrast       was injected. I personally interpreted the bile duct images. Ductal flow       of contrast was adequate. Image quality was adequate. Contrast extended       to the entire biliary tree.      4 filling defects c/w choledocholithiasis was found in the lower CBD.       duct, the largest of which was 10 mm in diameter. The CBD was dilated at        12 mm. The right and the left hepatic ducts were mildly dilated. The       cystic duct initially did not fill.      A 6 mm biliary sphincterotomy was made with a monofilament traction       (standard) sphincterotome using ERBE electrocautery on endocut mode at       12 o'clock position. There was mild oozing which stopped on its own. Due       to presence of diverticulum, we decided to proceed with       sphincteroplasty. Dilation of the common bile duct with a 07-13-11 mm       balloon (to a maximum balloon size of 11 mm) dilator was successful. The       biliary tree was swept with a 12 mm balloon starting at the bifurcation       several times. All stones were removed. Postocclusion cholangiogram did       not reveal any residual filling defects. Air bubble was seen in the mid       CBD. Cystic duct did fill. Minimal filling of the gallbladder       (intentionally). Bile was green. No pus.      PD was intentionally not cannulated or injected. Impression:               - Choledocholithiasis was found. Complete removal                            was accomplished by biliary sphincterotomy,                            sphincteroplasty and balloon extraction.                           - Periampullary diverticulum.                           - Patent cystic duct with cholelithiasis.                           - Duodenal edema, J-shaped stomach. Recommendation:           - Return patient to hospital ward for ongoing care.                           - Continue antibiotics x  total of 7 days.                           - Resume heparin  in 24 hours (if needed, pt with                            remote history of DVT)                           - Trend CBC, CMP.                           - We do recommend laparoscopic cholecystectomy.                           - Watch for pancreatitis, bleeding, perforation,                            and cholangitis.                           - The findings and  recommendations were discussed                            with the patient's family.                           Addendum: Called by pharmacy. Patient with history                            of DVT with factor V Leiden deficiency-in need for                            [facility]. Can start heparin  in 12 hours. Procedure Code(s):        --- Professional ---                           [phone], 59, Endoscopic retrograde                            cholangiopancreatography (ERCP); with                            trans-endoscopic balloon dilation of                            biliary/pancreatic duct(s) or of ampulla                            (sphincteroplasty), including sphincterotomy, when                            performed, each duct                           [id number], Endoscopic retrograde  cholangiopancreatography (ERCP); with removal of                            calculi/debris from biliary/pancreatic duct(s)                           [phone], Endoscopic catheterization of the biliary                            ductal system, radiological supervision and                            interpretation Diagnosis Code(s):        --- Professional ---                           K80.50, Calculus of bile duct without cholangitis                            or cholecystitis without obstruction                           K83.8, Other specified diseases of biliary tract CPT copyright 2022 [employer]. All rights reserved. The codes documented in this report are preliminary and upon coder review may  be revised to meet current compliance requirements. Lajuan Pila, MD 02/29/2024 4:40:50 PM This report has been signed electronically. Number of Addenda: 0

## 2024-02-29 NOTE — Anesthesia Procedure Notes (Signed)
 Procedure Name: Intubation Date/Time: 02/29/2024 3:13 PM  Performed by: Merna Aase, CRNAPre-anesthesia Checklist: Patient identified, Patient being monitored, Timeout performed, Emergency Drugs available and Suction available Patient Re-evaluated:Patient Re-evaluated prior to induction Oxygen Delivery Method: Circle system utilized Preoxygenation: Pre-oxygenation with 100% oxygen Induction Type: IV induction Ventilation: Mask ventilation without difficulty Laryngoscope Size: Mac and 4 Grade View: Grade I Tube type: Oral Tube size: 7.5 mm Number of attempts: 1 Airway Equipment and Method: Stylet Placement Confirmation: ETT inserted through vocal cords under direct vision, positive ETCO2 and breath sounds checked- equal and bilateral Secured at: 22 cm Tube secured with: Tape Dental Injury: Teeth and Oropharynx as per pre-operative assessment

## 2024-02-29 NOTE — Progress Notes (Signed)
 Progress Note     Subjective: Pt denies significant abdominal pain, nausea or vomiting to me this AM. No family at bedside yet at time of my exam but he reports family will be in later.   Objective: Vital signs in last 24 hours: Temp:  [97 F (36.1 C)-99.1 F (37.3 C)] 97.9 F (36.6 C) (05/30 0813) Pulse Rate:  [58-88] 67 (05/30 0900) Resp:  [8-30] 22 (05/30 0900) SpO2:  [87 %-99 %] 97 % (05/30 0900) Arterial Line BP: (83-136)/(52-78) 119/67 (05/30 0900) Last BM Date : 02/28/24  Intake/Output from previous day: 05/29 0701 - 05/30 0700 In: 2737.9 [I.V.:2428.8; IV Piggyback:309.1] Out: 575 [Urine:575] Intake/Output this shift: Total I/O In: 25.1 [IV Piggyback:25.1] Out: -   PE: General: pleasant, WD, obese male who is laying in bed in NAD HEENT: sclera anicteric  Heart: regular, rate, and rhythm.  Palpable radial and pedal pulses bilaterally Lungs: No wheezes, rhonchi, or rales noted.  Respiratory effort nonlabored Abd: soft, NT with negative Murphy sign, fullness to palpation of upper abdomen, no masses, hernias, or organomegaly Psych: A&Ox3 with an appropriate affect.    Lab Results:  Recent Labs    02/28/24 0523 02/29/24 0312  WBC 26.3* 29.7*  HGB 13.7 12.8*  HCT 40.8 38.1*  PLT 195 129*   BMET Recent Labs    02/28/24 0523 02/29/24 0312  NA 140 135  K 3.4* 3.9  CL 107 104  CO2 19* 20*  GLUCOSE 178* 150*  BUN 24* 28*  CREATININE 1.75* 1.48*  CALCIUM  8.6* 8.2*   PT/INR Recent Labs    02/28/24 0230  LABPROT 22.3*  INR 1.9*   CMP     Component Value Date/Time   NA 135 02/29/2024 0312   NA 138 12/29/2022 1605   K 3.9 02/29/2024 0312   CL 104 02/29/2024 0312   CO2 20 (L) 02/29/2024 0312   GLUCOSE 150 (H) 02/29/2024 0312   BUN 28 (H) 02/29/2024 0312   BUN 14 12/29/2022 1605   CREATININE 1.48 (H) 02/29/2024 0312   CREATININE 1.18 01/27/2021 0855   CALCIUM  8.2 (L) 02/29/2024 0312   PROT 4.8 (L) 02/29/2024 0312   PROT 6.2 12/29/2022 1605    ALBUMIN 2.3 (L) 02/29/2024 0312   ALBUMIN 4.0 12/29/2022 1605   AST 126 (H) 02/29/2024 0312   ALT 189 (H) 02/29/2024 0312   ALKPHOS 97 02/29/2024 0312   BILITOT 1.7 (H) 02/29/2024 0312   BILITOT 0.4 12/29/2022 1605   GFRNONAA 48 (L) 02/29/2024 0312   GFRNONAA 53 (L) 10/26/2020 1035   GFRAA 61 10/26/2020 1035   Lipase     Component Value Date/Time   LIPASE 379 (H) 02/29/2024 0312       Studies/Results: MR ABDOMEN MRCP W WO CONTAST Result Date: 02/28/2024 CLINICAL DATA:  Right upper quadrant abdominal pain elevated LFTs, rule out choledocholithiasis EXAM: MRI ABDOMEN WITHOUT AND WITH CONTRAST (INCLUDING MRCP) TECHNIQUE: Multiplanar multisequence MR imaging of the abdomen was performed both before and after the administration of intravenous contrast. Heavily T2-weighted images of the biliary and pancreatic ducts were obtained, and three-dimensional MRCP images were rendered by post processing. CONTRAST:  10mL GADAVIST GADOBUTROL 1 MMOL/ML IV SOLN COMPARISON:  Right upper quadrant ultrasound, 02/28/2024 FINDINGS: Lower chest: Cardiomegaly.  Small bilateral pleural effusions. Hepatobiliary: No solid liver abnormality is seen. Mildly distended gallbladder. Mild gallbladder wall thickening. Multiple gallstones. Mild intrahepatic biliary ductal dilatation, common bile duct at the upper limit of normal in caliber measuring 0.7 cm, with two adjacent  gallstones near the ampulla measuring 0.6 cm (series 3, image 28). Pancreas: Unremarkable. No pancreatic ductal dilatation or surrounding inflammatory changes. Spleen: Normal in size without significant abnormality. Adrenals/Urinary Tract: Definitively benign macroscopic fat containing left adrenal adenoma, for which no further follow-up or characterization is required. Kidneys are normal, without renal calculi, solid lesion, or hydronephrosis. Stomach/Bowel: Stomach is within normal limits. Descending duodenal diverticulum. No evidence of bowel wall  thickening, distention, or inflammatory changes. Vascular/Lymphatic: No significant vascular findings are present. No enlarged abdominal lymph nodes. Other: No abdominal wall hernia.  Anasarca.  Trace ascites. Musculoskeletal: No acute or significant osseous findings. IMPRESSION: 1. Choledocholithiasis, two adjacent gallstones within the common bile duct near the ampulla measuring 0.6 cm. 2. Mildly distended gallbladder with multiple gallstones and mild gallbladder wall thickening. Findings are concerning for acute cholecystitis. 3. Cardiomegaly.  Anasarca and small bilateral pleural effusions. 4. Trace ascites. These results will be called to the ordering clinician or representative by the Radiologist Assistant, and communication documented in the PACS or [employer]. Electronically Signed   By: Fredricka Jenny M.D.   On: 02/28/2024 20:25   ECHOCARDIOGRAM COMPLETE BUBBLE STUDY Result Date: 02/28/2024    ECHOCARDIOGRAM REPORT   Patient Name:   Omar [name] Date of Exam: 02/28/2024 Medical Rec #:  [id number]       Height:       68.0 in Accession #:    [id number]      Weight:       215.0 lb Date of Birth:  17-Jun-1946        BSA:          2.108 m Patient Age:    77 years        BP:           155/95 mmHg Patient Gender: M               HR:           87 bpm. Exam Location:  Inpatient Procedure: 2D Echo, Cardiac Doppler and Color Doppler (Both Spectral and Color            Flow Doppler were utilized during procedure). Indications:    Stroke I 63.9  History:        Patient has prior history of Echocardiogram examinations. Risk                 Factors:Hypertension.  Sonographer:    Jeralene Mom Referring Phys: [id number] GREGORY D CALONE IMPRESSIONS  1. Left ventricular ejection fraction, by estimation, is 50 to 55%. The left ventricle has low normal function. The left ventricle has no regional wall motion abnormalities. Left ventricular diastolic parameters are consistent with Grade I diastolic dysfunction  (impaired relaxation).  2. Right ventricular systolic function is normal. The right ventricular size is mildly enlarged.  3. Left atrial size was mildly dilated.  4. The mitral valve is normal in structure. Trivial mitral valve regurgitation. No evidence of mitral stenosis.  5. The aortic valve is tricuspid. Aortic valve regurgitation is trivial. Aortic valve sclerosis/calcification is present, without any evidence of aortic stenosis. Aortic valve area, by VTI measures 1.92 cm. Aortic valve mean gradient measures 4.0 mmHg.  Aortic valve Vmax measures 1.41 m/s.  6. The inferior vena cava is normal in size with greater than 50% respiratory variability, suggesting right atrial pressure of 3 mmHg.  7. Agitated saline contrast bubble study was negative, with no evidence of any interatrial shunt. Conclusion(s)/Recommendation(s): No intracardiac source of embolism  detected on this transthoracic study. Consider a transesophageal echocardiogram to exclude cardiac source of embolism if clinically indicated. FINDINGS  Left Ventricle: Left ventricular ejection fraction, by estimation, is 50 to 55%. The left ventricle has low normal function. The left ventricle has no regional wall motion abnormalities. The left ventricular internal cavity size was normal in size. There is no left ventricular hypertrophy. Left ventricular diastolic parameters are consistent with Grade I diastolic dysfunction (impaired relaxation). Normal left ventricular filling pressure. Right Ventricle: The right ventricular size is mildly enlarged. No increase in right ventricular wall thickness. Right ventricular systolic function is normal. Left Atrium: Left atrial size was mildly dilated. Right Atrium: Right atrial size was normal in size. Pericardium: There is no evidence of pericardial effusion. Mitral Valve: The mitral valve is normal in structure. Trivial mitral valve regurgitation. No evidence of mitral valve stenosis. Tricuspid Valve: The tricuspid  valve is normal in structure. Tricuspid valve regurgitation is not demonstrated. No evidence of tricuspid stenosis. Aortic Valve: The aortic valve is tricuspid. Aortic valve regurgitation is trivial. Aortic valve sclerosis/calcification is present, without any evidence of aortic stenosis. Aortic valve mean gradient measures 4.0 mmHg. Aortic valve peak gradient measures 8.0 mmHg. Aortic valve area, by VTI measures 1.92 cm. Pulmonic Valve: The pulmonic valve was normal in structure. Pulmonic valve regurgitation is not visualized. No evidence of pulmonic stenosis. Aorta: The aortic root is normal in size and structure. Venous: The inferior vena cava is normal in size with greater than 50% respiratory variability, suggesting right atrial pressure of 3 mmHg. IAS/Shunts: No atrial level shunt detected by color flow Doppler. Agitated saline contrast was given intravenously to evaluate for intracardiac shunting. Agitated saline contrast bubble study was negative, with no evidence of any interatrial shunt.  LEFT VENTRICLE PLAX 2D LVIDd:         4.10 cm   Diastology LVIDs:         3.00 cm   LV e' medial:    3.48 cm/s LV PW:         1.10 cm   LV E/e' medial:  12.2 LV IVS:        1.10 cm   LV e' lateral:   5.00 cm/s LVOT diam:     2.20 cm   LV E/e' lateral: 8.5 LV SV:         53 LV SV Index:   25 LVOT Area:     3.80 cm  RIGHT VENTRICLE RV Basal diam:  4.00 cm RV Mid diam:    3.90 cm RV S prime:     8.59 cm/s TAPSE (M-mode): 1.2 cm LEFT ATRIUM             Index        RIGHT ATRIUM           Index LA diam:        3.90 cm 1.85 cm/m   RA Area:     13.20 cm LA Vol (A2C):   87.3 ml 41.42 ml/m  RA Volume:   22.00 ml  10.44 ml/m LA Vol (A4C):   70.5 ml 33.45 ml/m LA Biplane Vol: 77.9 ml 36.96 ml/m  AORTIC VALVE AV Area (Vmax):    2.06 cm AV Area (Vmean):   1.92 cm AV Area (VTI):     1.92 cm AV Vmax:           141.00 cm/s AV Vmean:          95.050 cm/s AV VTI:  0.276 m AV Peak Grad:      8.0 mmHg AV Mean Grad:       4.0 mmHg LVOT Vmax:         76.50 cm/s LVOT Vmean:        48.100 cm/s LVOT VTI:          0.140 m LVOT/AV VTI ratio: 0.51  AORTA Ao Root diam: 3.00 cm Ao Asc diam:  3.00 cm MITRAL VALVE               TRICUSPID VALVE MV Area (PHT): 3.66 cm    TR Peak grad:   12.8 mmHg MV Decel Time: 207 msec    TR Vmax:        179.00 cm/s MR Peak grad: 44.6 mmHg MR Vmax:      334.00 cm/s  SHUNTS MV E velocity: 42.60 cm/s  Systemic VTI:  0.14 m MV A velocity: 59.50 cm/s  Systemic Diam: 2.20 cm MV E/A ratio:  0.72 Gaylyn Keas MD Electronically signed by Gaylyn Keas MD Signature Date/Time: 02/28/2024/3:16:01 PM    Final    DG CHEST PORT 1 VIEW Result Date: 02/28/2024 CLINICAL DATA:  [id number].  Encounter for central line placement. EXAM: PORTABLE CHEST 1 VIEW COMPARISON:  Portable chest 05/19/2022, CTA chest yesterday at 9:31 p.m. FINDINGS: 4:49 a.m. left IJ central line terminates about the superior cavoatrial junction with no visible pneumothorax. The heart is slightly enlarged. The mediastinum is stable with aortic atherosclerosis. Central vascular prominence is present without edema. The lungs are hypoinflated but generally clear. No new osseous findings.  Slight thoracic levoscoliosis. IMPRESSION: 1. Left IJ central line terminates about the superior cavoatrial junction with no visible pneumothorax. 2. Hypoinflation with central vascular prominence but no edema. 3. Aortic atherosclerosis. Electronically Signed   By: Denman Fischer M.D.   On: 02/28/2024 05:31   US  Abdomen Limited RUQ (LIVER/GB) Result Date: 02/28/2024 CLINICAL DATA:  Epigastric pain. EXAM: ULTRASOUND ABDOMEN LIMITED RIGHT UPPER QUADRANT COMPARISON:  None Available. FINDINGS: Gallbladder: Shadowing, echogenic gallstones are seen within the gallbladder lumen. The largest measures approximately 2.2 cm. The gallbladder wall measures 3.8 mm in thickness. No sonographic Murphy sign noted by sonographer. Common bile duct: Diameter: 5.7 mm Liver: No focal lesion  identified. Diffusely decreased echogenicity of the liver parenchyma is noted. Portal vein is patent on color Doppler imaging with normal direction of blood flow towards the liver. Other: Technically limited study secondary to limited patient positioning, as per the ultrasound technologist. IMPRESSION: 1. Cholelithiasis without evidence of acute cholecystitis. Electronically Signed   By: Virgle Grime M.D.   On: 02/28/2024 00:35   CT Angio Chest/Abd/Pel for Dissection W and/or W/WO Result Date: 02/27/2024 CLINICAL DATA:  Aortic aneurysm suspected Abdominal pain and distension.  Diaphoretic. EXAM: CT ANGIOGRAPHY CHEST, ABDOMEN AND PELVIS TECHNIQUE: Non-contrast CT of the chest was initially obtained. Multidetector CT imaging through the chest, abdomen and pelvis was performed using the standard protocol during bolus administration of intravenous contrast. Multiplanar reconstructed images and MIPs were obtained and reviewed to evaluate the vascular anatomy. RADIATION DOSE REDUCTION: This exam was performed according to the departmental dose-optimization program which includes automated exposure control, adjustment of the mA and/or kV according to patient size and/or use of iterative reconstruction technique. CONTRAST:  OMNIPAQUE IOHEXOL 350 MG/ML SOLN COMPARISON:  Noncontrast abdominopelvic CT 08/15/2019 FINDINGS: CTA CHEST FINDINGS Cardiovascular: No aortic hematoma on unenhanced exam. Diffuse aortic atherosclerosis and tortuosity without acute aortic findings. No aortic dissection. 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 There is no central pulmonary embolus to the proximal segmental level. The main pulmonary artery is dilated at 3.8 cm. No pericardial effusion. Coronary artery calcifications. Mediastinum/Nodes: No mediastinal or hilar adenopathy. Unremarkable appearance of the esophagus. No visible thyroid  nodule. Lungs/Pleura: Subsegmental atelectasis in the dependent  lower lobes. No confluent airspace disease. No pleural fluid. No features of pulmonary edema. Calcified granuloma in the right lower lobe, series 8, image 77, benign needing no further imaging follow-up. Musculoskeletal: Thoracic spondylosis. There are no acute or suspicious osseous abnormalities. Mild bilateral gynecomastia. Review of the MIP images confirms the above findings. CTA ABDOMEN AND PELVIS FINDINGS VASCULAR Aorta: Normal caliber aorta without aneurysm, dissection, vasculitis or significant stenosis. Moderate atherosclerosis. Celiac: Patent without evidence of aneurysm, dissection, vasculitis or significant stenosis. SMA: Patent without evidence of aneurysm, dissection, vasculitis or significant stenosis. Renals: 2 right and single left renal arteries are all patent. Renal arteries are patent without evidence of aneurysm, dissection, vasculitis, fibromuscular dysplasia or significant stenosis. IMA: Patent without evidence of aneurysm, dissection, vasculitis or significant stenosis. Inflow: Patent without evidence of aneurysm, dissection, vasculitis or significant stenosis. Moderate atherosclerosis and tortuosity. Veins: No obvious venous abnormality within the limitations of this arterial phase study. Review of the MIP images confirms the above findings. NON-VASCULAR Hepatobiliary: No focal liver abnormality. There are multiple intraluminal gallstones. Question mild gallbladder enhancement but no definite pericholecystic inflammation. Common bile duct is not well-defined on the current exam. Pancreas: Mild edema about the pancreatic head and uncinate process. No ductal dilatation. Spleen: Normal in size without focal abnormality. Adrenals/Urinary Tract: Low-density 2.6 cm left adrenal nodule is unchanged from prior exam and consistent with adenoma. The right adrenal gland is normal. Bilateral renal parenchymal thinning. No hydronephrosis. Nonobstructing 5 mm right renal stone. Partially distended urinary  bladder. Stomach/Bowel: The stomach is moderately distended with fluid, tiny hiatal hernia. There is wall thickening in the pre pyloric stomach, series 7, image 131 with question of faint adjacent fat stranding. Fat stranding and wall thickening about the third and fourth portion of the duodenum, for example series 5, image 57. Chronic small duodenal diverticulum. Mild small bowel wall thickening involving bowel loops in the left abdomen. No obstruction. Moderate volume of stool in the colon. Sigmoid colon is redundant. Lymphatic: Prominent periportal and upper mesenteric nodes. Reproductive: Enlarged prostate spans 5.4 cm. Other: No free air.  No ascites. Musculoskeletal: Right hip arthroplasty. Diffuse lumbar degenerative change. There are no acute or suspicious osseous abnormalities. Review of the MIP images confirms the above findings. IMPRESSION: 1. Fat stranding and wall thickening about the third and fourth portion of the duodenum, suspicious for duodenitis. There is also wall thickening in the pre pyloric stomach with question of faint adjacent fat stranding, may represent peptic ulcer disease. Additional mild wall thickening involving small bowel in the left abdomen, nonspecific enteritis. 2. Mild edema about the pancreatic head and uncinate process. This is favored to be related to duodenal inflammation, however recommend correlation with pancreatic enzymes. 3. Cholelithiasis. Question mild gallbladder enhancement but no definite pericholecystic inflammation. 4. Nonobstructing right renal stone. 5. Enlarged prostate. 6. Stable left adrenal adenoma. Aortic Atherosclerosis (ICD10-I70.0). Electronically Signed   By: Chadwick Colonel M.D.   On: 02/27/2024 22:10    Anti-infectives: Anti-infectives (From admission, onward)    Start     Dose/Rate Route Frequency Ordered Stop   02/28/24 1400  piperacillin-tazobactam (ZOSYN) IVPB 3.375 g        3.375 g 12.5 mL/hr over  240 Minutes Intravenous Every 8 hours  02/28/24 0842     02/28/24 1000  metroNIDAZOLE  (FLAGYL ) IVPB 500 mg  Status:  Discontinued        500 mg 100 mL/hr over 60 Minutes Intravenous Every 12 hours 02/28/24 0045 02/28/24 0828   02/28/24 1000  ceFEPIme (MAXIPIME) 2 g in sodium chloride  0.9 % 100 mL IVPB  Status:  Discontinued        2 g 200 mL/hr over 30 Minutes Intravenous Every 12 hours 02/28/24 0047 02/28/24 0840   02/28/24 0930  piperacillin-tazobactam (ZOSYN) IVPB 3.375 g  Status:  Discontinued        3.375 g 12.5 mL/hr over 240 Minutes Intravenous Every 8 hours 02/28/24 0840 02/28/24 0842   02/28/24 0930  piperacillin-tazobactam (ZOSYN) IVPB 3.375 g        3.375 g 100 mL/hr over 30 Minutes Intravenous  Once 02/28/24 0842 02/28/24 0944   02/28/24 0100  vancomycin  (VANCOREADY) IVPB 1750 mg/350 mL  Status:  Discontinued        1,750 mg 175 mL/hr over 120 Minutes Intravenous Every 48 hours 02/28/24 0050 02/28/24 0823   02/27/24 2215  ceFEPIme (MAXIPIME) 2 g in sodium chloride  0.9 % 100 mL IVPB        2 g 200 mL/hr over 30 Minutes Intravenous  Once 02/27/24 2212 02/27/24 2328   02/27/24 2215  metroNIDAZOLE  (FLAGYL ) IVPB 500 mg        500 mg 100 mL/hr over 60 Minutes Intravenous  Once 02/27/24 2212 02/28/24 0031        Assessment/Plan  Septic shock secondary to Acute biliary pancreatitis  - CT 5/28 with question of pancreatitis, possible cholecystitis, possible duodenitis with possible enteritis  - RUQ US  5/29 with cholelithiasis without cholecystitis - MRCP 5/29 with choledocholithiasis, possible cholecystitis and trace ascites - off pressors and clinically appears fairly well this AM - suspect inflammatory changes around the gallbladder are more reactive from pancreatitis but agree with ongoing abx in setting of worsening leukocytosis - WBC 29K from 19K - lipase 379 from 1500, Tbili/AST/ALT all trending down  - GI following and appears may be planning ERCP today  - general surgery will follow for planning  cholecystectomy but no surgical intervention planned today   FEN: NPO, IVF per CCM VTE: hep gtt on hold ID: Zosyn 5/29>>  - per CCM -  Hx of DVT on Eliquis , Factor V Leiden  T2DM HTN CAD s/p stent in 2013 HLD 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  This care required moderate level of medical decision making.    Annetta Killian, [facility] 02/29/2024, 9:20 AM Please see Amion for pager number during day hours 7:00am-4:30pm

## 2024-02-29 NOTE — Progress Notes (Signed)
 PHARMACY - ANTICOAGULATION CONSULT NOTE  Pharmacy Consult for Heparin  while Eliquis  on hold Indication: Hx DVT  Allergies  Allergen Reactions   Empagliflozin      Patient Measurements: Height: 5\' 8"  (172.7 cm) Weight: 97.5 kg (215 lb) IBW/kg (Calculated) : 68.4 HEPARIN  DW (KG): 89.1  Vital Signs: Temp: 97.7 F (36.5 C) (05/30 0314) Temp Source: Oral (05/30 0314) Pulse Rate: 69 (05/30 0430)  Labs: Recent Labs    02/27/24 2054 02/27/24 2054 02/27/24 2240 02/28/24 0230 02/28/24 0523 02/28/24 0949 02/28/24 1711 02/29/24 0312  HGB 17.0  --   --   --  13.7  --   --  12.8*  HCT 51.4  --   --   --  40.8  --   --  38.1*  PLT 318  --   --   --  195  --   --  129*  APTT  --    < >  --  142*  --  38* 162* 118*  LABPROT  --   --   --  22.3*  --   --   --   --   INR  --   --   --  1.9*  --   --   --   --   HEPARINUNFRC  --   --   --   --   --  0.53 >1.10* 0.87*  CREATININE  --   --  1.50*  --  1.75*  --   --  1.48*   < > = values in this interval not displayed.    Estimated Creatinine Clearance: 45.8 mL/min (A) (by C-G formula based on SCr of 1.48 mg/dL (H)).   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 Joleen Navy interpreted the  study-CPAP recommended, but refused by patient   Stroke [facility])    no deficits from stroke, "Didnt know I had one when they say I did".    Medications:  Scheduled:   Chlorhexidine  Gluconate Cloth  6 each Topical Daily   hydrocortisone  sod succinate (SOLU-CORTEF ) inj  100 mg Intravenous Q12H   insulin  aspart  0-15 Units Subcutaneous TID WC   pantoprazole  (PROTONIX ) IV  40 mg Intravenous Q24H   sodium chloride  flush  10-40 mL Intracatheter Q12H   Infusions:   norepinephrine  (LEVOPHED ) Adult infusion Stopped (02/29/24 0118)   piperacillin -tazobactam (ZOSYN )  IV Stopped (02/28/24 2159)   vasopressin  Stopped (02/28/24 1700)   PRN: docusate sodium , gadobutrol , melatonin, polyethylene glycol, sodium chloride  flush, sodium chloride  flush  Assessment: 78 yo male on chronic Eliquis  therapy of hx of Factor V mutation and DVT. Pharmacy consulted  to transition to IV heparin  while NPO. Last dose of Eliquis  taken 5/27 PM. Will utilize aPTT to monitor heparin  as heparin  levels will be falsely elevated due to recent DOAC use.  Heparin  level and aPTT are both supra-therapeutic.  Lab drawn appropriately per discussion with RN; no bleeding reported.  Patient's IV heparin  has been turned off around 0400 in anticipation of ERCP.  Goal of Therapy:  Heparin  level 0.3-0.7 units/ml aPTT 66-102 seconds Monitor platelets by anticoagulation protocol: Yes   Plan:  F/u anticoagulation plans after likely procedure today  Armanda Bern, PharmD, BCPS 02/29/2024, 4:54 AM

## 2024-02-29 NOTE — Progress Notes (Signed)
 PHARMACY - ANTICOAGULATION CONSULT NOTE  Pharmacy Consult for Heparin  while Eliquis  on hold Indication: Hx DVT  Allergies  Allergen Reactions   Empagliflozin      Patient Measurements: Height: 5\' 8"  (172.7 cm) Weight: 97.5 kg (215 lb) IBW/kg (Calculated) : 68.4 HEPARIN  DW (KG): 89.1  Vital Signs: Temp: 98 F (36.7 C) (05/30 1706) Temp Source: Temporal (05/30 1355) BP: 119/59 (05/30 1355) Pulse Rate: 74 (05/30 1706)  Labs: Recent Labs    02/27/24 2054 02/27/24 2054 02/27/24 2240 02/28/24 0230 02/28/24 0523 02/28/24 0949 02/28/24 1711 02/29/24 0312 02/29/24 1021  HGB 17.0  --   --   --  13.7  --   --  12.8*  --   HCT 51.4  --   --   --  40.8  --   --  38.1*  --   PLT 318  --   --   --  195  --   --  129*  --   APTT  --    < >  --  142*  --  38* 162* 118*  --   LABPROT  --   --   --  22.3*  --   --   --   --  19.4*  INR  --   --   --  1.9*  --   --   --   --  1.6*  HEPARINUNFRC  --   --   --   --   --  0.53 >1.10* 0.87*  --   CREATININE  --   --  1.50*  --  1.75*  --   --  1.48*  --    < > = values in this interval not displayed.    Estimated Creatinine Clearance: 45.8 mL/min (A) (by C-G formula based on SCr of 1.48 mg/dL (H)).   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 Pharmacy consulted to transition to IV heparin  while NPO. Last dose of Eliquis  taken 5/27 PM. Will utilize aPTT to monitor heparin  as heparin  levels will be falsely elevated due to recent DOAC use.  S/p ERCP with removal of choledocholithiasis and Pharmacy to resume IV heparin  12 hrs post procedure per GI.  Goal of Therapy:  Heparin  level 0.3-0.7 units/ml aPTT 66-102 seconds Monitor platelets by anticoagulation protocol: Yes   Plan:  On 5/31 at 0500, resume IV heparin  at a reduced rate of 1000 units/hr  Check 8 hr heparin  level Daily heparin  level and CBC F/u with plans for ex-lap  Alaisa Moffitt D. Marikay Show, PharmD, BCPS, BCCCP 02/29/2024, 7:10 PM

## 2024-02-29 NOTE — Transfer of Care (Signed)
 Immediate Anesthesia Transfer of Care Note  Patient: Omar [name]  Procedure(s) Performed: ERCP, WITH INTERVENTION IF INDICATED  Patient Location: PACU  Anesthesia Type:General  Level of Consciousness: awake  Airway & Oxygen Therapy: Patient Spontanous Breathing  Post-op Assessment: Report given to RN and Post -op Vital signs reviewed and stable  Post vital signs: Reviewed and stable  Last Vitals:  Vitals Value Taken Time  BP    Temp    Pulse 73 02/29/24 1638  Resp 26 02/29/24 1638  SpO2 95 % 02/29/24 1638  Vitals shown include unfiled device data.  Last Pain:  Vitals:   02/29/24 1355  TempSrc: Temporal  PainSc: 0-No pain         Complications: No notable events documented.

## 2024-02-29 NOTE — Progress Notes (Signed)
 I saw this patient a short while ago in the ICU with his family at the bedside.  He looks better today, says he has less abdominal pain and he is now off pressors with stable hemodynamics.  WBC has increased, however LFTs have improved and renal function is stable.  I informed them that his MRCP showed common bile duct stones and he needs an ERCP for stone extraction. Rationale for this, technical aspects of the procedure, risks and benefits were all reviewed in detail (with his daughter videotaping the conversation in order to show it to the patient's wife who is expected to arrive later this morning). Omar [name] was agreeable to proceeding with the ERCP as scheduled this afternoon.   The benefits and risks of the planned procedure(s) were described in detail with the patient or (when appropriate) their health care proxy.  Risks were outlined as including, but not limited to, bleeding, infection, perforation, adverse medication reaction leading to cardiac or pulmonary decompensation, pancreatitis (if ERCP).  The limitation of incomplete mucosal visualization was also discussed.  No guarantees or warranties were given. Patient at increased risk for cardiopulmonary complications of procedure due to medical comorbidities.  He will meet Dr. Magnus Schuller prior to the procedure later today, and I have personally reviewed the case again with Dr. Venice Gillis who is aware of the clinical scenario.   I ordered a repeat PT/INR stat earlier this morning, results still pending.  I suspect his initial INR on admission was elevated to 1.9 from a combination of oral anticoagulation and sepsis.  Heparin  was turned off early this morning to decrease the chance of bleeding from any necessary sphincterotomy.   Lorella Roles, MD    [facility]

## 2024-02-29 NOTE — Progress Notes (Signed)
 NAME:  Omar [name], MRN:  [id number], DOB:  [birth year], LOS: 1 ADMISSION DATE:  02/27/2024, CONSULTATION DATE:  02/28/2024 REFERRING MD:  Eldon Greenland, MD, CHIEF COMPLAINT:  Abdominal pain  History of Present Illness:  78 y/o male who has a PMH for DVT on Eliquis , DMT2, HTN, CAD s/p 1 coronary stent, Factor V Mutation, HLD, Obesity who was in his usual state of health until this morning when he was driving to [place], [place] (he delivers cars to dealerships).  He says he only ate pork skins and a sun drop.  He vomited a couple times going to [place] then several times on the way back.  At home he vomited more and then when he got to the hospital he vomiting a few morn times per wife who was at bedside.  He had a diffusely tender belly on admission.  CT scan abdomen showed: IMPRESSION: 1. Fat stranding and wall thickening about the third and fourth portion of the duodenum, suspicious for duodenitis. There is also wall thickening in the pre pyloric stomach with question of faint adjacent fat stranding, may represent peptic ulcer disease. Additional mild wall thickening involving small bowel in the left abdomen, nonspecific enteritis. 2. Mild edema about the pancreatic head and uncinate process. This is favored to be related to duodenal inflammation, however recommend correlation with pancreatic enzymes. 3. Cholelithiasis. Question mild gallbladder enhancement but no definite pericholecystic inflammation. 4. Nonobstructing right renal stone. 5. Enlarged prostate. 6. Stable left adrenal adenoma.  ED blood work showing LA 9 and decreased to 6.6, increased LFTs, Cr 1.50, WBC 19.3.  Pertinent  Medical History  Abnormal EKG, BPH (benign prostatic hyperplasia), Chronic anticoagulation (01/16/2017), Coronary artery disease, Degenerative joint disease, Diabetes mellitus, type 2 (HCC), DVT (deep venous thrombosis) (HCC) (4 -5 yrs ago), DVT of leg (deep venous thrombosis) (HCC) (07/02/2015), Heart murmur,  Hepatic steatosis, Heterozygous factor V Leiden mutation (HCC) (01/16/2017), Hyperlipidemia, Hypertension, Meningioma (HCC), Neuropathy, Obesity, Primary localized osteoarthritis of right hip (06/24/2019), Sleep apnea (2008), and Stroke [facility])  Significant Hospital Events: Including procedures, antibiotic start and stop dates in addition to other pertinent events   5/29: admit to ICU for septic shock, polymicrobial bacteremia, ID/ GI consult  Interim History / Subjective:  MRCP last evening c/w choledocholithiasis > plans for ERCP today  Off pressors No complaints, mild RUQ soreness on palpation Improving AKI and LFTs  Objective    Blood pressure (!) 75/61, pulse 74, temperature 97.9 F (36.6 C), temperature source Oral, resp. rate 16, height 5\' 8"  (1.727 m), weight 97.5 kg, SpO2 97%. CVP:  [12 mmHg-15 mmHg] 13 mmHg      Intake/Output Summary (Last 24 hours) at 02/29/2024 1025 Last data filed at 02/29/2024 1000 Gross per 24 hour  Intake 1998.47 ml  Output 575 ml  Net 1423.47 ml   Filed Weights   02/27/24 2032  Weight: 97.5 kg   Examination: General:  older male sitting in bed in NAD HEENT: MM pink/moist, upper dentures, puils 3/r Neuro: Awake, oriented, MAE CV: rr, NSR, L internal jugular CVL, left radial aline PULM:  non labored, clear, more shallow/ diminished in bases, remains on 2L [place], now working on IS GI: obese, +bs, mildly tender in RUQ to deep palpation, voids Extremities: warm/dry, generalized edema, +1 LE Skin: no rashes  Afebrile Labs> K 3.9, bicarb 20, BUN/ sCr 24/ 1.75> 28/ 1.48, lipase 1292> 379, AST/ ALT improving 126/ 189, t.bili 3.1> 1.7, WBC 26.3> 29.7, plt 195> 129  UOP 575 ml/hr plus  1 unmeasured  Net +4.9L  GI panel neg  Resolved problem list   Assessment and Plan   Septic shock 2/2 acute gallstone/biliary pancreatitis  Polymicrobial bacteremia- Enterococcus faecalis, Klebsiella PNA, and E. Coli secondary suspected 2/2 to GI translocation due to  above Lactic acidosis, improving - CT 5/28 questions pancreatitis, possible cholecystitis, possible duodenitis, possible enteritis  - RUQ US  5/29 with cholelithiasis without cholecystitis - MRCP 5/29 with choledocholithiasis, possible cholecystitis and trace ascites P: - appreciate GI, surgery, and ID assistance  - plans for ERCP today w/GI - per surgery, suspects inflammatory changes to gallbladder more reactive from pancreatitis.  Will need cholecystectomy at some point - NPO - stool GI panel neg - remains off pressors, goal MAP > 65 - echo 5/29> EF 50-55%, no RWMA, G1DD, normal RV - stop stress dose steroids  - cont zosyn per pharmacy  - repeat BC today - WBC up but clinically better, could be related to stress dose steroids since stopped now off vasopressors - lipase, LFTs, t. Bili improving  - trend WBC/ fever curve  - if stable after ERCP today, plans to d/c CVL/ aline - PPI  - aggressive pulmonary hygiene- IS, mobilize - wean supplemental O2 for sat goal > 92%   AKI, resolving  BPH - renal renal indices improving, UOP ~0.57ml/kg/hr, continue to monitor closely  - voiding, resume flomax   - strict I/Os, daily wts - avoid nephrotoxins, renal dose meds, hemodynamic support as above  Hx of LE DVT/ factor V Leiden on home eliquis  - heparin  infusion per pharmacy > on hold for ERCP today  T2DM - A1c 6.3 - CBG AC/ HS with moderate SSI prn - cont to hold home metformin / semaglutide   HTN CAD s/p stent 2013 HLD - cont to hold pta lisinopril , acebutolol , verapamil , crestor , spiro, prn torsemide  as just off vasopressor support, normotensive - echo 5/29> EF 50-55%, no RWMA, G1DD, normal RV  Thrombocytopenia - 318> 129, suspected related to sepsis, does not fit timing for HIT - trend on CBC   Obesity class 1, BMI 32.69 - outpt weight loss management, was on semaglutide pta will need to readdress given biliary pancreatitis    Best Practice (right click and "Reselect all  SmartList Selections" daily)   Diet/type: NPO DVT prophylaxis systemic heparin > on hold Pressure ulcer(s): N/A GI prophylaxis: PPI Lines: Central line and Arterial Line Foley:  N/A Code Status:  full code  Pt updated on plan of care, wife not at hospital yet.    Labs   CBC: Recent Labs  Lab 02/27/24 2054 02/28/24 0523 02/29/24 0312  WBC 19.3* 26.3* 29.7*  NEUTROABS 18.5*  --   --   HGB 17.0 13.7 12.8*  HCT 51.4 40.8 38.1*  MCV 91.0 90.5 89.6  PLT 318 195 129*    Basic Metabolic Panel: Recent Labs  Lab 02/27/24 2240 02/28/24 0230 02/28/24 0523 02/29/24 0312  NA 139  --  140 135  K 3.8  --  3.4* 3.9  CL 108  --  107 104  CO2 18*  --  19* 20*  GLUCOSE 157*  --  178* 150*  BUN 19  --  24* 28*  CREATININE 1.50*  --  1.75* 1.48*  CALCIUM  8.3*  --  8.6* 8.2*  MG  --  1.4*  --   --    GFR: Estimated Creatinine Clearance: 45.8 mL/min (A) (by C-G formula based on SCr of 1.48 mg/dL (H)). Recent Labs  Lab 02/27/24 2054 02/27/24 2202  02/27/24 2311 02/28/24 0232 02/28/24 0523 02/29/24 0312  WBC 19.3*  --   --   --  26.3* 29.7*  LATICACIDVEN  --  9.0* 6.6* 3.4* 2.6*  --     Liver Function Tests: Recent Labs  Lab 02/27/24 2240 02/28/24 0523 02/29/24 0312  AST 329* 257* 126*  ALT 262* 249* 189*  ALKPHOS 141* 115 97  BILITOT 3.0* 3.1* 1.7*  PROT 4.8* 4.6* 4.8*  ALBUMIN 2.5* 2.4* 2.3*   Recent Labs  Lab 02/27/24 2240 02/28/24 0523 02/29/24 0312  LIPASE 1,537* 1,292* 379*  AMYLASE  --  1,260*  --    No results for input(s): "AMMONIA" in the last 168 hours.  ABG    Component Value Date/Time   TCO2 29 02/21/2012 1110     Coagulation Profile: Recent Labs  Lab 02/28/24 0230  INR 1.9*    Cardiac Enzymes: No results for input(s): "CKTOTAL", "CKMB", "CKMBINDEX", "TROPONINI" in the last 168 hours.  HbA1C: Hgb A1c MFr Bld  Date/Time Value Ref Range Status  02/28/2024 02:30 AM 6.3 (H) 4.8 - 5.6 % Final    Comment:    (NOTE) Diagnosis of  Diabetes The following HbA1c ranges recommended by the American Diabetes Association (ADA) may be used as an aid in the diagnosis of diabetes mellitus.  Hemoglobin             Suggested A1C NGSP%              Diagnosis  <5.7                   Non Diabetic  5.7-6.4                Pre-Diabetic  >6.4                   Diabetic  <7.0                   Glycemic control for                       adults with diabetes.    12/29/2022 04:05 PM 7.6 (H) 4.8 - 5.6 % Final    Comment:             Prediabetes: 5.7 - 6.4          Diabetes: >6.4          Glycemic control for adults with diabetes: <7.0     CBG: Recent Labs  Lab 02/28/24 0719 02/28/24 1132 02/28/24 1519 02/28/24 2123 02/29/24 0809  GLUCAP 150* 199* 168* 168* 117*    Allergies Allergies  Allergen Reactions   Empagliflozin       Home Medications  Prior to Admission medications   Medication Sig Start Date End Date Taking? Authorizing Provider  acebutolol  (SECTRAL ) 200 MG capsule Take 1 capsule (200 mg total) by mouth 2 (two) times daily. 12/14/23   Knox Perl, MD  apixaban  (ELIQUIS ) 5 MG TABS tablet Take 5 mg by mouth 2 (two) times daily.    [provider]  lisinopril  (ZESTRIL ) 5 MG tablet TAKE (1) TABLET BY MOUTH ONCE DAILY. 12/06/23   Tobi Fortes, MD  metFORMIN  (GLUCOPHAGE ) 500 MG tablet Take 1 tablet (500 mg total) by mouth 2 (two) times daily with a meal. 09/04/23   Tobi Fortes, MD  Multiple Vitamins-Minerals (ONE-A-DAY 50 PLUS PO) Take 1 tablet by mouth at bedtime.     [provider]  nitroGLYCERIN  (  NITROSTAT ) 0.4 MG SL tablet PLACE 1 TAB UNDER TONGUE EVERY 5 MIN IF NEEDED FOR CHEST PAIN. MAY USE 3 TIMES.NO RELIEF CALL 911. 08/15/23   Knox Perl, MD  rosuvastatin  (CRESTOR ) 40 MG tablet Take 1 tablet (40 mg total) by mouth daily. 07/06/23   Tobi Fortes, MD  Semaglutide,0.25 or 0.5MG /DOS, 2 MG/3ML SOPN Inject 1.5 mg into the skin once a week. 08/18/22   [provider]   spironolactone  (ALDACTONE ) 50 MG tablet TAKE 1 TABLET BY MOUTH EVERY MORNING 08/01/23   Dixon, Phillip E, MD  testosterone  cypionate (DEPOTESTOSTERONE CYPIONATE) 200 MG/ML injection INJECT 0.5ML INTO THE MUSCLE EVERY 2 WEEKS. 11/12/23   Trent Frizzle, MD  torsemide  (DEMADEX ) 20 MG tablet Take 20 mg by mouth daily as needed (swelling). 12/19/16   [provider]  verapamil  (CALAN -SR) 240 MG CR tablet TAKE ONE TABLET BY MOUTH ONCE DAILY AT BEDTIME 11/06/23   Tobi Fortes, MD     Critical care time: 32 mins       Early Glisson, MSN, AG-ACNP-BC [facility] 02/29/2024, 10:25 AM  See Amion for pager If no response to pager , please call [phone] until 7pm After 7:00 pm call Elink  [phone]

## 2024-02-29 NOTE — Progress Notes (Signed)
 MRCP positive for CBD stones.  Tentatively on for ERCP today at 2 pm.  IV heparin  stopped earlier. INR mildly elevated earlier in admission ( takes eliquis  at home). Repeat INR is pending.  WBC up to 29.7K today. Tbili 3.1 >> 1.7.  On Zosyn. Afebrile this am

## 2024-02-29 NOTE — Interval H&P Note (Signed)
 History and Physical Interval Note:  02/29/2024 2:24 PM  Omar [name]  has presented today for surgery, with the diagnosis of choledocholithiasis.  The various methods of treatment have been discussed with the patient and family. After consideration of risks, benefits and other options for treatment, the patient has consented to  Procedure(s): ERCP, WITH INTERVENTION IF INDICATED (N/A) as a surgical intervention.  The patient's history has been reviewed, patient examined, no change in status, stable for surgery.  I have reviewed the patient's chart and labs.  Questions were answered to the patient's satisfaction.    I have explained risks and benefits including small but definite risks of pancreatitis (<10%), bleeding (<1%), perforation (<1%). The risks of general anesthesia were also discussed by me and anesthesia staff.  The benefits were also discussed.  Patient wishes to proceed.  All the questions were answered.    Lajuan Pila

## 2024-02-29 NOTE — Progress Notes (Signed)
 [facility]  Date of Admission:  02/27/2024      Assessment: Omar [name] is a 78 year old Caucasian male admitted with abdominal pain, distention, and vomiting and found to have polymicrobial bacteremia with Enterococcus faecalis, Klebsiella pneumonia, and E. coli with questionable cholecystitis or pancreatitis given elevated liver chemistries and lipase. Source of infection likely GI tract and could be translocation.  Mrcp confirms choledocholithiasis with cholangitis/pancreatitis Gall bladder with stone too -- surgery planning eventual removal but unclear timing   Off pressors 5/29 Mild thromobocytopenia due to sepsis Lft much improved Wbc still high but started on stress dose steroid 5/29  Short duration/clear source no need endocarditis w/u in setting e faecalis  5/30 repeat bcx in progress   PLAN:   Continue pip-tazo Await ercp F/u blood culture If repeat bcx negative plan 2 weeks e feacalis coverage from 5/30; 1 week abx coverage for kleb pna and ecoli should be sufficient Maintain standard isolation precaution Remaining medical and supportive care per CCM.  Principal Problem:   Septic shock (HCC) Active Problems:   Bacteremia due to Enterococcus   Bacteremia due to Klebsiella pneumoniae   Bacteremia due to Escherichia coli   RUQ pain   Elevated LFTs   Gallstones   Acute biliary pancreatitis without infection or necrosis   Allergies  Allergen Reactions   Empagliflozin      Scheduled Meds:  Chlorhexidine  Gluconate Cloth  6 each Topical Daily   insulin  aspart  0-15 Units Subcutaneous TID WC   pantoprazole  (PROTONIX ) IV  40 mg Intravenous Q24H   sodium chloride  flush  10-40 mL Intracatheter Q12H   tamsulosin   0.4 mg Oral QPC supper   Continuous Infusions:  piperacillin-tazobactam (ZOSYN)  IV Stopped (02/29/24 1017)   PRN Meds:.docusate sodium , gadobutrol, melatonin, polyethylene glycol, sodium chloride  flush, sodium  chloride flush   SUBJECTIVE: Off pressors, no complaint Mrcp showed choledocholithiasis Pending ercp Repeat bcx in progress   Review of Systems: ROS All other ROS was negative, except mentioned above     OBJECTIVE: Vitals:   02/29/24 0900 02/29/24 1000 02/29/24 1100 02/29/24 1139  BP:      Pulse: 67 74 66   Resp: (!) 22 16 18    Temp:    97.7 F (36.5 C)  TempSrc:    Oral  SpO2: 97% 97% 100%   Weight:      Height:       Body mass index is 32.69 kg/m.  Physical Exam General/constitutional: no distress, pleasant HEENT: Normocephalic, PER, Conj Clear, EOMI, Oropharynx clear Neck supple CV: rrr no mrg Lungs: clear to auscultation, normal respiratory effort Abd: Soft, Nontender Ext: no edema Skin: No Rash Neuro: nonfocal MSK: no peripheral joint swelling/tenderness/warmth; back spines nontender     Lab Results Lab Results  Component Value Date   WBC 29.7 (H) 02/29/2024   HGB 12.8 (L) 02/29/2024   HCT 38.1 (L) 02/29/2024   MCV 89.6 02/29/2024   PLT 129 (L) 02/29/2024    Lab Results  Component Value Date   CREATININE 1.48 (H) 02/29/2024   BUN 28 (H) 02/29/2024   NA 135 02/29/2024   K 3.9 02/29/2024   CL 104 02/29/2024   CO2 20 (L) 02/29/2024    Lab Results  Component Value Date   ALT 189 (H) 02/29/2024   AST 126 (H) 02/29/2024   ALKPHOS 97 02/29/2024   BILITOT 1.7 (H) 02/29/2024      Microbiology: Recent Results (from the past  240 hours)  Culture, blood (routine x 2)     Status: None (Preliminary result)   Collection Time: 02/27/24  9:17 PM   Specimen: BLOOD LEFT FOREARM  Result Value Ref Range Status   Specimen Description BLOOD LEFT FOREARM  Final   Special Requests   Final    BOTTLES DRAWN AEROBIC AND ANAEROBIC Blood Culture adequate volume   Culture  Setup Time   Final    GRAM NEGATIVE RODS GRAM POSITIVE COCCI IN BOTH AEROBIC AND ANAEROBIC BOTTLES CRITICAL VALUE NOTED.  VALUE IS CONSISTENT WITH PREVIOUSLY REPORTED AND CALLED VALUE.     Culture   Final    GRAM NEGATIVE RODS GRAM POSITIVE COCCI CULTURE REINCUBATED FOR BETTER GROWTH Performed at [facility], [address], [place], [place] [address]    Report Status PENDING  Incomplete  Culture, blood (routine x 2)     Status: None (Preliminary result)   Collection Time: 02/27/24  9:22 PM   Specimen: BLOOD RIGHT FOREARM  Result Value Ref Range Status   Specimen Description BLOOD RIGHT FOREARM  Final   Special Requests   Final    BOTTLES DRAWN AEROBIC AND ANAEROBIC Blood Culture adequate volume   Culture  Setup Time   Final    GRAM NEGATIVE RODS GRAM POSITIVE COCCI IN BOTH AEROBIC AND ANAEROBIC BOTTLES CRITICAL RESULT CALLED TO, READ BACK BY AND VERIFIED WITH: Carmie Chough on [id number] @0830  by SM    Culture   Final    GRAM NEGATIVE RODS GRAM POSITIVE COCCI CULTURE REINCUBATED FOR BETTER GROWTH Performed at [facility], [address] [place], [place] [address]    Report Status PENDING  Incomplete  Blood Culture ID Panel (Reflexed)     Status: Abnormal   Collection Time: 02/27/24  9:22 PM  Result Value Ref Range Status   Enterococcus faecalis DETECTED (A) NOT DETECTED Final    Comment: CRITICAL RESULT CALLED TO, READ BACK BY AND VERIFIED WITH: PHARMD Emily on [id number] @0830  by SM    Enterococcus Faecium NOT DETECTED NOT DETECTED Final   Listeria monocytogenes NOT DETECTED NOT DETECTED Final   Staphylococcus species NOT DETECTED NOT DETECTED Final   Staphylococcus aureus (BCID) NOT DETECTED NOT DETECTED Final   Staphylococcus epidermidis NOT DETECTED NOT DETECTED Final   Staphylococcus lugdunensis NOT DETECTED NOT DETECTED Final   Streptococcus species NOT DETECTED NOT DETECTED Final   Streptococcus agalactiae NOT DETECTED NOT DETECTED Final   Streptococcus pneumoniae NOT DETECTED NOT DETECTED Final   Streptococcus pyogenes NOT DETECTED NOT DETECTED Final   A.calcoaceticus-baumannii NOT DETECTED NOT DETECTED Final   Bacteroides fragilis NOT  DETECTED NOT DETECTED Final   Enterobacterales DETECTED (A) NOT DETECTED Final    Comment: CRITICAL RESULT CALLED TO, READ BACK BY AND VERIFIED WITH: PHARMD Emily on [id number] @0830  by SM    Enterobacter cloacae complex NOT DETECTED NOT DETECTED Final   Escherichia coli DETECTED (A) NOT DETECTED Final    Comment: CRITICAL RESULT CALLED TO, READ BACK BY AND VERIFIED WITH: PHARMD Emily on [id number] @0830  by SM    Klebsiella aerogenes NOT DETECTED NOT DETECTED Final   Klebsiella oxytoca NOT DETECTED NOT DETECTED Final   Klebsiella pneumoniae DETECTED (A) NOT DETECTED Final    Comment: CRITICAL RESULT CALLED TO, READ BACK BY AND VERIFIED WITH: PHARMD Emily on [id number] @0830  by SM    Proteus species NOT DETECTED NOT DETECTED Final   Salmonella species NOT DETECTED NOT DETECTED Final   Serratia marcescens NOT DETECTED NOT  DETECTED Final   Haemophilus influenzae NOT DETECTED NOT DETECTED Final   Neisseria meningitidis NOT DETECTED NOT DETECTED Final   Pseudomonas aeruginosa NOT DETECTED NOT DETECTED Final   Stenotrophomonas maltophilia NOT DETECTED NOT DETECTED Final   Candida albicans NOT DETECTED NOT DETECTED Final   Candida auris NOT DETECTED NOT DETECTED Final   Candida glabrata NOT DETECTED NOT DETECTED Final   Candida krusei NOT DETECTED NOT DETECTED Final   Candida parapsilosis NOT DETECTED NOT DETECTED Final   Candida tropicalis NOT DETECTED NOT DETECTED Final   Cryptococcus neoformans/gattii NOT DETECTED NOT DETECTED Final   CTX-M ESBL NOT DETECTED NOT DETECTED Final   Carbapenem resistance IMP NOT DETECTED NOT DETECTED Final   Carbapenem resistance KPC NOT DETECTED NOT DETECTED Final   Carbapenem resistance NDM NOT DETECTED NOT DETECTED Final   Carbapenem resist OXA 48 LIKE NOT DETECTED NOT DETECTED Final   Vancomycin  resistance NOT DETECTED NOT DETECTED Final   Carbapenem resistance VIM NOT DETECTED NOT DETECTED Final    Comment: Performed at [facility], [address],  [place], [place] [address]  MRSA Next Gen by PCR, Nasal     Status: None   Collection Time: 02/28/24 12:43 AM   Specimen: Nasal Mucosa; Nasal Swab  Result Value Ref Range Status   MRSA by PCR Next Gen NOT DETECTED NOT DETECTED Final    Comment: (NOTE) The GeneXpert MRSA Assay (FDA approved for NASAL specimens only), is one component of a comprehensive MRSA colonization surveillance program. It is not intended to diagnose MRSA infection nor to guide or monitor treatment for MRSA infections. Test performance is not FDA approved in patients less than 68 years old. Performed at [facility], [address], [place], [place] [address]   Gastrointestinal Panel by PCR , Stool     Status: None   Collection Time: 02/28/24  5:01 PM   Specimen: Stool  Result Value Ref Range Status   Campylobacter species NOT DETECTED NOT DETECTED Final   Plesimonas shigelloides NOT DETECTED NOT DETECTED Final   Salmonella species NOT DETECTED NOT DETECTED Final   Yersinia enterocolitica NOT DETECTED NOT DETECTED Final   Vibrio species NOT DETECTED NOT DETECTED Final   Vibrio cholerae NOT DETECTED NOT DETECTED Final   Enteroaggregative E coli (EAEC) NOT DETECTED NOT DETECTED Final   Enteropathogenic E coli (EPEC) NOT DETECTED NOT DETECTED Final   Enterotoxigenic E coli (ETEC) NOT DETECTED NOT DETECTED Final   Shiga like toxin producing E coli (STEC) NOT DETECTED NOT DETECTED Final   Shigella/Enteroinvasive E coli (EIEC) NOT DETECTED NOT DETECTED Final   Cryptosporidium NOT DETECTED NOT DETECTED Final   Cyclospora cayetanensis NOT DETECTED NOT DETECTED Final   Entamoeba histolytica NOT DETECTED NOT DETECTED Final   Giardia lamblia NOT DETECTED NOT DETECTED Final   Adenovirus F40/41 NOT DETECTED NOT DETECTED Final   Astrovirus NOT DETECTED NOT DETECTED Final   Norovirus GI/GII NOT DETECTED NOT DETECTED Final   Rotavirus A NOT DETECTED NOT DETECTED Final   Sapovirus (I, II, IV, and V) NOT DETECTED NOT DETECTED  Final    Comment: Performed at [facility], [address] [place], [place] [address]     Serology:   Imaging: If present, new imagings (plain films, ct scans, and mri) have been personally visualized and interpreted; radiology reports have been reviewed. Decision making incorporated into the Impression / Recommendations.  02/29/24 mrcp 1. Choledocholithiasis, two adjacent gallstones within the common bile duct near the ampulla measuring  0.6 cm. 2. Mildly distended gallbladder with multiple gallstones and mild gallbladder wall thickening. Findings are concerning for acute cholecystitis. 3. Cardiomegaly.  Anasarca and small bilateral pleural effusions. 4. Trace ascites.     Jamesetta Mcbride, MD [facility] [phone] pager    02/29/2024, 12:52 PM

## 2024-02-29 NOTE — TOC CM/SW Note (Signed)
 Transition of Care [facility]) - Inpatient Brief Assessment   Patient Details  Name: Omar [name] MRN: [id number] Date of Birth: 1945-11-29  Transition of Care [facility]) CM/SW Contact:    Tandy Fam, LCSW Phone Number: 02/29/2024, 10:28 AM   Clinical Narrative:    Patient from home with spouse with abdominal pain, scheduled for ERCP today. Medical workup still ongoing, TOC to follow for needs when appropriate for disposition.    Transition of Care Asessment: Insurance and Status: Insurance coverage has been reviewed Patient has primary care physician: Yes Home environment has been reviewed: Home   Prior/Current Home Services: No current home services Social Drivers of Health Review: SDOH reviewed no interventions necessary Readmission risk has been reviewed: Yes Transition of care needs: no transition of care needs at this time

## 2024-02-29 NOTE — Anesthesia Preprocedure Evaluation (Signed)
 Anesthesia Evaluation  Patient identified by MRN, date of birth, ID band Patient awake    Reviewed: Allergy & Precautions, NPO status , Patient's Chart, lab work & pertinent test results  History of Anesthesia Complications Negative for: history of anesthetic complications  Airway Mallampati: III  TM Distance: >3 FB Neck ROM: Full    Dental  (+) Dental Advisory Given   Pulmonary sleep apnea    breath sounds clear to auscultation       Cardiovascular hypertension, Pt. on medications + CAD and + Cardiac Stents   Rhythm:Regular  Left ventricle: The cavity size was normal. Wall thickness    was normal. Systolic function was normal. The estimated    ejection fraction was in the range of 55% to 60%. Although    no diagnostic regional wall motion abnormality was    identified, this possibility cannot be completely excluded    on the basis of this study. Doppler parameters are    consistent with abnormal left ventricular relaxation    (grade 1 diastolic dysfunction).  - Aortic valve: Poorly visualized. Mildly calcified annulus.    Mildly thickened leaflets. No significant regurgitation.  - Mitral valve: Calcified annulus. Trivial regurgitation.  - Left atrium: The atrium was mildly dilated.  - Tricuspid valve: Trivial regurgitation.  - Pericardium, extracardiac: A possible, small pericardial    effusion was identified.    Left ventricle: The cavity size was normal. Wall thickness    was normal. Systolic function was normal. The estimated    ejection fraction was in the range of 55% to 60%. Although    no diagnostic regional wall motion abnormality was    identified, this possibility cannot be completely excluded    on the basis of this study. Doppler parameters are    consistent with abnormal left ventricular relaxation    (grade 1 diastolic dysfunction).  - Aortic valve: Poorly visualized. Mildly calcified annulus.    Mildly  thickened leaflets. No significant regurgitation.  - Mitral valve: Calcified annulus. Trivial regurgitation.  - Left atrium: The atrium was mildly dilated.  - Tricuspid valve: Trivial regurgitation.  - Pericardium, extracardiac: A possible, small pericardial    effusion was identified.     Neuro/Psych neg Seizures CVA, No Residual Symptoms    GI/Hepatic Choledocholithiasis   Lab Results      Component                Value               Date                      ALT                      189 (H)             02/29/2024                AST                      126 (H)             02/29/2024                ALKPHOS                  97                  02/29/2024  BILITOT                  1.7 (H)             02/29/2024              Endo/Other  diabetes    Renal/GU ARFRenal diseaseLab Results      Component                Value               Date                      NA                       135                 02/29/2024                K                        3.9                 02/29/2024                CO2                      20 (L)              02/29/2024                GLUCOSE                  150 (H)             02/29/2024                BUN                      28 (H)              02/29/2024                CREATININE               1.48 (H)            02/29/2024                CALCIUM                   8.2 (L)             02/29/2024                EGFR                     67                  12/29/2022                GFRNONAA                 48 (L)              02/29/2024                Musculoskeletal  (+) Arthritis ,    Abdominal   Peds  Hematology  (+) Blood dyscrasia, anemia Lab Results  Component                Value               Date                      WBC                      29.7 (H)            02/29/2024                HGB                      12.8 (L)            02/29/2024                HCT                      38.1 (L)             02/29/2024                MCV                      89.6                02/29/2024                PLT                      129 (L)             02/29/2024             eliquis    Anesthesia Other Findings   Reproductive/Obstetrics                              Anesthesia Physical Anesthesia Plan  ASA: 3  Anesthesia Plan: General   Post-op Pain Management: Minimal or no pain anticipated   Induction: Intravenous  PONV Risk Score and Plan: 2 and Ondansetron  and Dexamethasone   Airway Management Planned: Oral ETT  Additional Equipment: None  Intra-op Plan:   Post-operative Plan: Extubation in OR  Informed Consent: I have reviewed the patients History and Physical, chart, labs and discussed the procedure including the risks, benefits and alternatives for the proposed anesthesia with the patient or authorized representative who has indicated his/her understanding and acceptance.     Dental advisory given  Plan Discussed with: CRNA  Anesthesia Plan Comments:          Anesthesia Quick Evaluation

## 2024-02-29 NOTE — H&P (View-Only) (Signed)
 MRCP positive for CBD stones.  Tentatively on for ERCP today at 2 pm.  IV heparin  stopped earlier. INR mildly elevated earlier in admission ( takes eliquis  at home). Repeat INR is pending.  WBC up to 29.7K today. Tbili 3.1 >> 1.7.  On Zosyn. Afebrile this am

## 2024-03-01 ENCOUNTER — Other Ambulatory Visit: Payer: Self-pay | Admitting: Internal Medicine

## 2024-03-01 DIAGNOSIS — K8309 Other cholangitis: Secondary | ICD-10-CM

## 2024-03-01 DIAGNOSIS — K8050 Calculus of bile duct without cholangitis or cholecystitis without obstruction: Secondary | ICD-10-CM

## 2024-03-01 DIAGNOSIS — N179 Acute kidney failure, unspecified: Secondary | ICD-10-CM | POA: Diagnosis not present

## 2024-03-01 DIAGNOSIS — I10 Essential (primary) hypertension: Secondary | ICD-10-CM

## 2024-03-01 DIAGNOSIS — K851 Biliary acute pancreatitis without necrosis or infection: Secondary | ICD-10-CM

## 2024-03-01 DIAGNOSIS — R7881 Bacteremia: Secondary | ICD-10-CM | POA: Diagnosis not present

## 2024-03-01 DIAGNOSIS — R6521 Severe sepsis with septic shock: Secondary | ICD-10-CM

## 2024-03-01 DIAGNOSIS — A419 Sepsis, unspecified organism: Secondary | ICD-10-CM | POA: Diagnosis not present

## 2024-03-01 DIAGNOSIS — R7989 Other specified abnormal findings of blood chemistry: Secondary | ICD-10-CM | POA: Diagnosis not present

## 2024-03-01 LAB — COMPREHENSIVE METABOLIC PANEL WITH GFR
ALT: 131 U/L — ABNORMAL HIGH (ref 0–44)
AST: 66 U/L — ABNORMAL HIGH (ref 15–41)
Albumin: 2.2 g/dL — ABNORMAL LOW (ref 3.5–5.0)
Alkaline Phosphatase: 87 U/L (ref 38–126)
Anion gap: 8 (ref 5–15)
BUN: 34 mg/dL — ABNORMAL HIGH (ref 8–23)
CO2: 23 mmol/L (ref 22–32)
Calcium: 8.2 mg/dL — ABNORMAL LOW (ref 8.9–10.3)
Chloride: 106 mmol/L (ref 98–111)
Creatinine, Ser: 1.47 mg/dL — ABNORMAL HIGH (ref 0.61–1.24)
GFR, Estimated: 48 mL/min — ABNORMAL LOW (ref >60)
Glucose, Bld: 95 mg/dL (ref 70–99)
Potassium: 3.6 mmol/L (ref 3.5–5.1)
Sodium: 137 mmol/L (ref 135–145)
Total Bilirubin: 1 mg/dL (ref 0.0–1.2)
Total Protein: 4.9 g/dL — ABNORMAL LOW (ref 6.5–8.1)

## 2024-03-01 LAB — GLUCOSE, CAPILLARY
Glucose-Capillary: 91 mg/dL (ref 70–99)
Glucose-Capillary: 142 mg/dL — ABNORMAL HIGH (ref 70–99)
Glucose-Capillary: 187 mg/dL — ABNORMAL HIGH (ref 70–99)
Glucose-Capillary: 160 mg/dL — ABNORMAL HIGH (ref 70–99)

## 2024-03-01 LAB — CBC
HCT: 37.6 % — ABNORMAL LOW (ref 39.0–52.0)
Hemoglobin: 12.6 g/dL — ABNORMAL LOW (ref 13.0–17.0)
MCH: 30.3 pg (ref 26.0–34.0)
MCHC: 33.5 g/dL (ref 30.0–36.0)
MCV: 90.4 fL (ref 80.0–100.0)
Platelets: 119 10*3/uL — ABNORMAL LOW (ref 150–400)
RBC: 4.16 MIL/uL — ABNORMAL LOW (ref 4.22–5.81)
RDW: 15.6 % — ABNORMAL HIGH (ref 11.5–15.5)
WBC: 23.7 10*3/uL — ABNORMAL HIGH (ref 4.0–10.5)
nRBC: 0 % (ref 0.0–0.2)

## 2024-03-01 LAB — APTT: aPTT: 35 s (ref 24–36)

## 2024-03-01 LAB — HEPARIN LEVEL (UNFRACTIONATED): Heparin Unfractionated: 0.19 [IU]/mL — ABNORMAL LOW (ref 0.30–0.70)

## 2024-03-01 MED ORDER — LOPERAMIDE HCL 2 MG PO CAPS
2.0000 mg | ORAL_CAPSULE | ORAL | Status: DC | PRN
  Administered 2024-03-01 – 2024-03-02 (×4): 2 mg via ORAL
  Filled 2024-03-01 (×4): qty 1

## 2024-03-01 MED ORDER — HEPARIN (PORCINE) 25000 UT/250ML-% IV SOLN
1200.0000 [IU]/h | INTRAVENOUS | Status: DC
  Administered 2024-03-01: 1000 [IU]/h via INTRAVENOUS
  Filled 2024-03-01: qty 250

## 2024-03-01 MED ORDER — HEPARIN (PORCINE) 25000 UT/250ML-% IV SOLN: 1000.0000 [IU]/h | INTRAVENOUS | Status: DC

## 2024-03-01 MED ORDER — POTASSIUM CHLORIDE 10 MEQ/50ML IV SOLN
10.0000 meq | INTRAVENOUS | Status: AC
  Administered 2024-03-01 (×4): 10 meq via INTRAVENOUS
  Filled 2024-03-01 (×4): qty 50

## 2024-03-01 NOTE — Progress Notes (Signed)
 [facility] night cross cover note:  Per patient request, prn imodium added for loose stool.     Omar Cavalier, DO Hospitalist

## 2024-03-01 NOTE — Progress Notes (Signed)
 1 Day Post-Op   Subjective/Chief Complaint: Pt with min pain this AM Had some liquids yesterday Heparin  on hold  Objective: Vital signs in last 24 hours: Temp:  [96.7 F (35.9 C)-98.1 F (36.7 C)] 98.1 F (36.7 C) (05/31 0745) Pulse Rate:  [66-86] 73 (05/31 0800) Resp:  [12-25] 13 (05/31 0800) BP: (89-119)/(56-75) 97/67 (05/31 0800) SpO2:  [91 %-100 %] 96 % (05/31 0800) Arterial Line BP: (114-127)/(60-68) 114/60 (05/30 2018) Weight:  [100.7 kg] 100.7 kg (05/31 0253) Last BM Date : 03/01/24  Intake/Output from previous day: 05/30 0701 - 05/31 0700 In: 1710.2 [P.O.:840; I.V.:699; IV Piggyback:171.2] Out: 850 [Urine:850] Intake/Output this shift: Total I/O In: 53.2 [IV Piggyback:53.2] Out: -   PE:  Constitutional: No acute distress, conversant, appears states age. Eyes: Anicteric sclerae, moist conjunctiva, no lid lag Lungs: Clear to auscultation bilaterally, normal respiratory effort CV: regular rate and rhythm, no murmurs, no peripheral edema, pedal pulses 2+ GI: Soft, no masses or hepatosplenomegaly, min-tender to palpation Skin: No rashes, palpation reveals normal turgor Psychiatric: appropriate judgment and insight, oriented to person, place, and time   Lab Results:  Recent Labs    02/29/24 0312 03/01/24 0253  WBC 29.7* 23.7*  HGB 12.8* 12.6*  HCT 38.1* 37.6*  PLT 129* 119*   BMET Recent Labs    02/29/24 0312 03/01/24 0253  NA 135 137  K 3.9 3.6  CL 104 106  CO2 20* 23  GLUCOSE 150* 95  BUN 28* 34*  CREATININE 1.48* 1.47*  CALCIUM  8.2* 8.2*   PT/INR Recent Labs    02/28/24 0230 02/29/24 1021  LABPROT 22.3* 19.4*  INR 1.9* 1.6*   ABG No results for input(s): "PHART", "HCO3" in the last 72 hours.  Invalid input(s): "PCO2", "PO2"  Studies/Results: MR ABDOMEN MRCP W WO CONTAST Result Date: 02/28/2024 CLINICAL DATA:  Right upper quadrant abdominal pain elevated LFTs, rule out choledocholithiasis EXAM: MRI ABDOMEN WITHOUT AND WITH CONTRAST  (INCLUDING MRCP) TECHNIQUE: Multiplanar multisequence MR imaging of the abdomen was performed both before and after the administration of intravenous contrast. Heavily T2-weighted images of the biliary and pancreatic ducts were obtained, and three-dimensional MRCP images were rendered by post processing. CONTRAST:  10mL GADAVIST  GADOBUTROL  1 MMOL/ML IV SOLN COMPARISON:  Right upper quadrant ultrasound, 02/28/2024 FINDINGS: Lower chest: Cardiomegaly.  Small bilateral pleural effusions. Hepatobiliary: No solid liver abnormality is seen. Mildly distended gallbladder. Mild gallbladder wall thickening. Multiple gallstones. Mild intrahepatic biliary ductal dilatation, common bile duct at the upper limit of normal in caliber measuring 0.7 cm, with two adjacent gallstones near the ampulla measuring 0.6 cm (series 3, image 28). Pancreas: Unremarkable. No pancreatic ductal dilatation or surrounding inflammatory changes. Spleen: Normal in size without significant abnormality. Adrenals/Urinary Tract: Definitively benign macroscopic fat containing left adrenal adenoma, for which no further follow-up or characterization is required. Kidneys are normal, without renal calculi, solid lesion, or hydronephrosis. Stomach/Bowel: Stomach is within normal limits. Descending duodenal diverticulum. No evidence of bowel wall thickening, distention, or inflammatory changes. Vascular/Lymphatic: No significant vascular findings are present. No enlarged abdominal lymph nodes. Other: No abdominal wall hernia.  Anasarca.  Trace ascites. Musculoskeletal: No acute or significant osseous findings. IMPRESSION: 1. Choledocholithiasis, two adjacent gallstones within the common bile duct near the ampulla measuring 0.6 cm. 2. Mildly distended gallbladder with multiple gallstones and mild gallbladder wall thickening. Findings are concerning for acute cholecystitis. 3. Cardiomegaly.  Anasarca and small bilateral pleural effusions. 4. Trace ascites. These  results will be called to the ordering clinician or  representative by the Radiologist Assistant, and communication documented in the PACS or [employer]. Electronically Signed   By: Fredricka Jenny M.D.   On: 02/28/2024 20:25   ECHOCARDIOGRAM COMPLETE BUBBLE STUDY Result Date: 02/28/2024    ECHOCARDIOGRAM REPORT   Patient Name:   Omar [name] Date of Exam: 02/28/2024 Medical Rec #:  [id number]       Height:       68.0 in Accession #:    [id number]      Weight:       215.0 lb Date of Birth:  [birth year]        BSA:          2.108 m Patient Age:    77 years        BP:           155/95 mmHg Patient Gender: M               HR:           87 bpm. Exam Location:  Inpatient Procedure: 2D Echo, Cardiac Doppler and Color Doppler (Both Spectral and Color            Flow Doppler were utilized during procedure). Indications:    Stroke I 63.9  History:        Patient has prior history of Echocardiogram examinations. Risk                 Factors:Hypertension.  Sonographer:    Jeralene Mom Referring Phys: [id number] GREGORY D CALONE IMPRESSIONS  1. Left ventricular ejection fraction, by estimation, is 50 to 55%. The left ventricle has low normal function. The left ventricle has no regional wall motion abnormalities. Left ventricular diastolic parameters are consistent with Grade I diastolic dysfunction (impaired relaxation).  2. Right ventricular systolic function is normal. The right ventricular size is mildly enlarged.  3. Left atrial size was mildly dilated.  4. The mitral valve is normal in structure. Trivial mitral valve regurgitation. No evidence of mitral stenosis.  5. The aortic valve is tricuspid. Aortic valve regurgitation is trivial. Aortic valve sclerosis/calcification is present, without any evidence of aortic stenosis. Aortic valve area, by VTI measures 1.92 cm. Aortic valve mean gradient measures 4.0 mmHg.  Aortic valve Vmax measures 1.41 m/s.  6. The inferior vena cava is normal in size with greater than 50%  respiratory variability, suggesting right atrial pressure of 3 mmHg.  7. Agitated saline contrast bubble study was negative, with no evidence of any interatrial shunt. Conclusion(s)/Recommendation(s): No intracardiac source of embolism detected on this transthoracic study. Consider a transesophageal echocardiogram to exclude cardiac source of embolism if clinically indicated. FINDINGS  Left Ventricle: Left ventricular ejection fraction, by estimation, is 50 to 55%. The left ventricle has low normal function. The left ventricle has no regional wall motion abnormalities. The left ventricular internal cavity size was normal in size. There is no left ventricular hypertrophy. Left ventricular diastolic parameters are consistent with Grade I diastolic dysfunction (impaired relaxation). Normal left ventricular filling pressure. Right Ventricle: The right ventricular size is mildly enlarged. No increase in right ventricular wall thickness. Right ventricular systolic function is normal. Left Atrium: Left atrial size was mildly dilated. Right Atrium: Right atrial size was normal in size. Pericardium: There is no evidence of pericardial effusion. Mitral Valve: The mitral valve is normal in structure. Trivial mitral valve regurgitation. No evidence of mitral valve stenosis. Tricuspid Valve: The tricuspid valve is normal in structure. Tricuspid valve regurgitation is not demonstrated.  No evidence of tricuspid stenosis. Aortic Valve: The aortic valve is tricuspid. Aortic valve regurgitation is trivial. Aortic valve sclerosis/calcification is present, without any evidence of aortic stenosis. Aortic valve mean gradient measures 4.0 mmHg. Aortic valve peak gradient measures 8.0 mmHg. Aortic valve area, by VTI measures 1.92 cm. Pulmonic Valve: The pulmonic valve was normal in structure. Pulmonic valve regurgitation is not visualized. No evidence of pulmonic stenosis. Aorta: The aortic root is normal in size and structure. Venous:  The inferior vena cava is normal in size with greater than 50% respiratory variability, suggesting right atrial pressure of 3 mmHg. IAS/Shunts: No atrial level shunt detected by color flow Doppler. Agitated saline contrast was given intravenously to evaluate for intracardiac shunting. Agitated saline contrast bubble study was negative, with no evidence of any interatrial shunt.  LEFT VENTRICLE PLAX 2D LVIDd:         4.10 cm   Diastology LVIDs:         3.00 cm   LV e' medial:    3.48 cm/s LV PW:         1.10 cm   LV E/e' medial:  12.2 LV IVS:        1.10 cm   LV e' lateral:   5.00 cm/s LVOT diam:     2.20 cm   LV E/e' lateral: 8.5 LV SV:         53 LV SV Index:   25 LVOT Area:     3.80 cm  RIGHT VENTRICLE RV Basal diam:  4.00 cm RV Mid diam:    3.90 cm RV S prime:     8.59 cm/s TAPSE (M-mode): 1.2 cm LEFT ATRIUM             Index        RIGHT ATRIUM           Index LA diam:        3.90 cm 1.85 cm/m   RA Area:     13.20 cm LA Vol (A2C):   87.3 ml 41.42 ml/m  RA Volume:   22.00 ml  10.44 ml/m LA Vol (A4C):   70.5 ml 33.45 ml/m LA Biplane Vol: 77.9 ml 36.96 ml/m  AORTIC VALVE AV Area (Vmax):    2.06 cm AV Area (Vmean):   1.92 cm AV Area (VTI):     1.92 cm AV Vmax:           141.00 cm/s AV Vmean:          95.050 cm/s AV VTI:            0.276 m AV Peak Grad:      8.0 mmHg AV Mean Grad:      4.0 mmHg LVOT Vmax:         76.50 cm/s LVOT Vmean:        48.100 cm/s LVOT VTI:          0.140 m LVOT/AV VTI ratio: 0.51  AORTA Ao Root diam: 3.00 cm Ao Asc diam:  3.00 cm MITRAL VALVE               TRICUSPID VALVE MV Area (PHT): 3.66 cm    TR Peak grad:   12.8 mmHg MV Decel Time: 207 msec    TR Vmax:        179.00 cm/s MR Peak grad: 44.6 mmHg MR Vmax:      334.00 cm/s  SHUNTS MV E velocity: 42.60 cm/s  Systemic VTI:  0.14 m  MV A velocity: 59.50 cm/s  Systemic Diam: 2.20 cm MV E/A ratio:  0.72 Gaylyn Keas MD Electronically signed by Gaylyn Keas MD Signature Date/Time: 02/28/2024/3:16:01 PM    Final      Anti-infectives: Anti-infectives (From admission, onward)    Start     Dose/Rate Route Frequency Ordered Stop   02/28/24 1400  piperacillin -tazobactam (ZOSYN ) IVPB 3.375 g        3.375 g 12.5 mL/hr over 240 Minutes Intravenous Every 8 hours 02/28/24 0842     02/28/24 1000  metroNIDAZOLE  (FLAGYL ) IVPB 500 mg  Status:  Discontinued        500 mg 100 mL/hr over 60 Minutes Intravenous Every 12 hours 02/28/24 0045 02/28/24 0828   02/28/24 1000  ceFEPIme  (MAXIPIME ) 2 g in sodium chloride  0.9 % 100 mL IVPB  Status:  Discontinued        2 g 200 mL/hr over 30 Minutes Intravenous Every 12 hours 02/28/24 0047 02/28/24 0840   02/28/24 0930  piperacillin -tazobactam (ZOSYN ) IVPB 3.375 g  Status:  Discontinued        3.375 g 12.5 mL/hr over 240 Minutes Intravenous Every 8 hours 02/28/24 0840 02/28/24 0842   02/28/24 0930  piperacillin -tazobactam (ZOSYN ) IVPB 3.375 g        3.375 g 100 mL/hr over 30 Minutes Intravenous  Once 02/28/24 0842 02/28/24 0944   02/28/24 0100  vancomycin  (VANCOREADY) IVPB 1750 mg/350 mL  Status:  Discontinued        1,750 mg 175 mL/hr over 120 Minutes Intravenous Every 48 hours 02/28/24 0050 02/28/24 0823   02/27/24 2215  ceFEPIme  (MAXIPIME ) 2 g in sodium chloride  0.9 % 100 mL IVPB        2 g 200 mL/hr over 30 Minutes Intravenous  Once 02/27/24 2212 02/27/24 2328   02/27/24 2215  metroNIDAZOLE  (FLAGYL ) IVPB 500 mg        500 mg 100 mL/hr over 60 Minutes Intravenous  Once 02/27/24 2212 02/28/24 0031       Assessment/Plan: s/p Procedure(s): ERCP, WITH INTERVENTION IF INDICATED (N/A) -s/p ERCP -Plan for surgery today vs. Tomorrow, heparin  on hold -mobilize  LOS: 2 days    Shela Derby 03/01/2024

## 2024-03-01 NOTE — Progress Notes (Signed)
 eLink Physician-Brief Progress Note Patient Name: Omar [name] DOB: 1945-11-23 MRN: [id number]   Date of Service  03/01/2024  HPI/Events of Note  numerous liquid bowel movements 7-8/day.  Negative GI pathogen panel.  Has leukocytosis to 22.3, duodenal thickening, known gallstone pancreatitis.    eICU Interventions  Attempted to page C. difficile provider on-call with no response.  Order C. difficile panel, if negative can initiate Imodium     Intervention Category Minor Interventions: Clinical assessment - ordering diagnostic tests  Alyssha Housh 03/01/2024, 10:58 PM

## 2024-03-01 NOTE — Progress Notes (Signed)
 PHARMACY - ANTICOAGULATION CONSULT NOTE  Pharmacy Consult for Heparin  while Eliquis  on hold Indication: Hx DVT  Allergies  Allergen Reactions   Empagliflozin      Patient Measurements: Height: 5\' 8"  (172.7 cm) Weight: 100.7 kg (222 lb 0.1 oz) IBW/kg (Calculated) : 68.4 HEPARIN  DW (KG): 89.1  Vital Signs: Temp: 98.3 F (36.8 C) (05/31 1537) Temp Source: Oral (05/31 1537) BP: 110/80 (05/31 1600) Pulse Rate: 80 (05/31 1600)  Labs: Recent Labs    02/28/24 0230 02/28/24 0523 02/28/24 0949 02/28/24 1711 02/29/24 0312 02/29/24 1021 03/01/24 0253 03/01/24 1800  HGB  --  13.7  --   --  12.8*  --  12.6*  --   HCT  --  40.8  --   --  38.1*  --  37.6*  --   PLT  --  195  --   --  129*  --  119*  --   APTT 142*  --    < > 162* 118*  --   --  35  LABPROT 22.3*  --   --   --   --  19.4*  --   --   INR 1.9*  --   --   --   --  1.6*  --   --   HEPARINUNFRC  --   --    < > >1.10* 0.87*  --   --  0.19*  CREATININE  --  1.75*  --   --  1.48*  --  1.47*  --    < > = values in this interval not displayed.    Estimated Creatinine Clearance: 46.9 mL/min (A) (by C-G formula based on SCr of 1.47 mg/dL (H)).   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 Pharmacy consulted to transition to IV heparin  while NPO. Last dose of Eliquis  taken 5/27 PM. Will utilize aPTT to monitor heparin  as heparin  levels will be falsely elevated due to recent DOAC use.  S/p ERCP with removal of choledocholithiasis, heparin  resumed this afternoon. aPTT and heparin  level are subtherapeutic, not quite correlating. Heparin  to stop in am for tentative surgery.  Goal of Therapy:  Heparin  level 0.3-0.7 units/ml aPTT 66-102 seconds Monitor platelets by anticoagulation protocol: Yes   Plan:  Increase heparin  to 1200 units/h - stop at 0400 6/1 F/U [facility] plans postop  Levin Reamer, PharmD, BCPS, [facility] Clinical Pharmacist [phone] Please check AMION for all [facility]  numbers 03/01/2024

## 2024-03-01 NOTE — Plan of Care (Signed)
  Problem: Health Behavior/Discharge Planning: Goal: Ability to manage health-related needs will improve Outcome: Progressing   Problem: Clinical Measurements: Goal: Will remain free from infection Outcome: Progressing Goal: Diagnostic test results will improve Outcome: Progressing Goal: Cardiovascular complication will be avoided Outcome: Progressing   Problem: Activity: Goal: Risk for activity intolerance will decrease Outcome: Progressing   Problem: Nutrition: Goal: Adequate nutrition will be maintained Outcome: Progressing   Problem: Elimination: Goal: Will not experience complications related to bowel motility Outcome: Progressing Goal: Will not experience complications related to urinary retention Outcome: Progressing   Problem: Pain Managment: Goal: General experience of comfort will improve and/or be controlled Outcome: Progressing   Problem: Safety: Goal: Ability to remain free from injury will improve Outcome: Progressing   Problem: Skin Integrity: Goal: Risk for impaired skin integrity will decrease Outcome: Progressing   Problem: Education: Goal: Ability to describe self-care measures that may prevent or decrease complications (Diabetes Survival Skills Education) will improve Outcome: Progressing   Problem: Coping: Goal: Ability to adjust to condition or change in health will improve Outcome: Progressing   Problem: Fluid Volume: Goal: Ability to maintain a balanced intake and output will improve Outcome: Progressing   Problem: Health Behavior/Discharge Planning: Goal: Ability to identify and utilize available resources and services will improve Outcome: Progressing   Problem: Metabolic: Goal: Ability to maintain appropriate glucose levels will improve Outcome: Progressing   Problem: Nutritional: Goal: Maintenance of adequate nutrition will improve Outcome: Progressing Goal: Progress toward achieving an optimal weight will improve Outcome:  Progressing   Problem: Skin Integrity: Goal: Risk for impaired skin integrity will decrease Outcome: Progressing   Problem: Tissue Perfusion: Goal: Adequacy of tissue perfusion will improve Outcome: Progressing

## 2024-03-01 NOTE — Hospital Course (Signed)
 78 y/o male who has a PMH for DVT on Eliquis , DMT2, HTN, CAD s/p 1 coronary stent, Factor V Mutation, HLD, Obesity admitted with septic shock secondary to choledocholithiasis and pancreatitis with likely cholecystitis.  Initially PCCM, transition to TRH 5/31 for pickup.   Assessment and Plan:   Septic shock with multiple species bacteremia - Initially requiring IV fluid boluses and vasopressor medications.  Vasopressors subsequently weaned off.  Blood pressure appears stable at this time.  Blood cultures noting Enterococcus faecalis, E. coli, Klebsiella.  Infectious disease following closely.  Zosyn  transition to Unasyn, plans for 2-week coverage starting 5/30.  1 week antibiotic coverage for Klebsiella/E. coli should be sufficient.  Likely transition to p.o. Augmentin on discharge.   Acute pancreatitis/transaminitis secondary to choledocholithiasis - MRCP noting common bile duct stones.  ERCP 5/30 for stone extraction sphincterotomy.  Recommendations for antibiotics x 7 days.  Monitor for pancreatitis.  GI signed off.  S/p cholecystectomy 6/2.  Leukocytosis improving.  Advance diet per general surgery.   Likely acute cholecystitis - Evaluated by general surgery.  S/p laparoscopic cholecystectomy.   Acute kidney injury - Creatinine continues to improve.  IV fluids on board.  Recheck BMP in AM.   Factor V Leiden - History of Eliquis  use.  Awaiting resumption of Eliquis  per general surgery.

## 2024-03-01 NOTE — Progress Notes (Addendum)
 eLink Physician-Brief Progress Note Patient Name: Omar [name] DOB: 1945/12/01 MRN: [id number]   Date of Service  03/01/2024  HPI/Events of Note  K 3.6  eICU Interventions  KCL   2203 -numerous liquid bowel movements 7-8/day.  Negative GI pathogen panel.  Has leukocytosis to 22.3, duodenal thickening, known gallstone pancreatitis.  Attempted to page C. difficile provider on-call with no response.  Order C. difficile panel, if negative can initiate Imodium  Intervention Category Minor Interventions: Electrolytes abnormality - evaluation and management  Antanisha Mohs 03/01/2024, 5:45 AM

## 2024-03-01 NOTE — Progress Notes (Addendum)
 Daily Progress Note  DOA: 02/27/2024 Hospital Day: 4   Cc:  Biliary pancreatitis  Brief History:  78 y.o. 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 Admitted with pancreatitis / septic shock and elevated LFTs.  MRCP consistent with choledocholithiasis and possible cholecystitis. See 5/29 consult note  ASSESSMENT    Gallstone pancreatitis Status post ERCP with stone removal and sphincterotomy Today:  Afebrile, VSS .  Crit 37.6% . Bilirubin normalized, alk phos remains normal , liver enzymes continue to trend down.  Some improvement in white count but still elevated at 23.7 though got two stress doses of Solumedrol.   GNR septic shock  / leukocytosis Probably 2/2 to pancreatitis / ? Acute cholecystitis. No purulent drainage removed from bile duct during ERCP to suggest cholangitis.   Abnormal wall thickening / inflammatory changes about the 3rd and 4th portion of duodenum on CT scan. Possibly reactive to pancreatitis and biliary processes. This didn't seem to be appreciate at time of ERCP.   AKI Creatinine 1.47  Leiden factor V mutation / History of lower extremity DVT On Eliquis  at home.  History of Barrett's esophagus No found on last colonoscopy in 2020  History of colon polyps.  Colonoscopy 04/16/2019 identified 15 polyps removed from the colon and rectum, the largest polyp measured 15 mm was removed from the sigmoid colon.  Principal Problem:   Septic shock (HCC) Active Problems:   Bacteremia due to Enterococcus   Bacteremia due to Klebsiella pneumoniae   Bacteremia   RUQ pain   Elevated LFTs   Gallstones   Acute biliary pancreatitis   Cholangitis   PLAN   --General Surgery following, plan is to surgery today vrs tomorrow --Infectious Disease  following. On Zosyn  Repeat blood culture pending --GI will sign off, call with questions  Subjective   No abdominal pain today   Objective   GI Studies:  ERCP 02/29/2024 Choledocholithiasis was found.  Complete removal was accomplished by biliary sphincterotomy, sphincteroplasty and balloon extraction.  Periampullary diverticulum.  Patent cystic duct with cholelithiasis.  Duodenal edema, J-shaped stomach  Recent Labs    02/28/24 0523 02/29/24 0312 03/01/24 0253  WBC 26.3* 29.7* 23.7*  HGB 13.7 12.8* 12.6*  HCT 40.8 38.1* 37.6*  MCV 90.5 89.6 90.4  PLT 195 129* 119*   No results for input(s): "FOLATE", "VITAMINB12", "FERRITIN", "TIBC", "IRONPCTSAT" in the last 72 hours. Recent Labs    02/28/24 0523 02/29/24 0312 03/01/24 0253  NA 140 135 137  K 3.4* 3.9 3.6  CL 107 104 106  CO2 19* 20* 23  GLUCOSE 178* 150* 95  BUN 24* 28* 34*  CREATININE 1.75* 1.48* 1.47*  CALCIUM  8.6* 8.2* 8.2*   Recent Labs    02/28/24 0523 02/29/24 0312 03/01/24 0253  PROT 4.6* 4.8* 4.9*  ALBUMIN 2.4* 2.3* 2.2*  AST 257* 126* 66*  ALT 249* 189* 131*  ALKPHOS 115 97 87  BILITOT 3.1* 1.7* 1.0  BILIDIR  --  1.0*  --       Imaging:  MR ABDOMEN MRCP W WO CONTAST CLINICAL DATA:  Right upper quadrant abdominal pain elevated LFTs, rule out choledocholithiasis  EXAM: MRI ABDOMEN WITHOUT AND WITH CONTRAST (INCLUDING MRCP)  TECHNIQUE: Multiplanar multisequence MR imaging of the abdomen was performed both before and after the administration of  intravenous contrast. Heavily T2-weighted images of the biliary and pancreatic ducts were obtained, and three-dimensional MRCP images were rendered by post processing.  CONTRAST:  10mL GADAVIST  GADOBUTROL  1 MMOL/ML IV SOLN  COMPARISON:  Right upper quadrant ultrasound, 02/28/2024  FINDINGS: Lower chest: Cardiomegaly.  Small bilateral pleural effusions.  Hepatobiliary: No solid liver abnormality is seen. Mildly distended gallbladder. Mild  gallbladder wall thickening. Multiple gallstones. Mild intrahepatic biliary ductal dilatation, common bile duct at the upper limit of normal in caliber measuring 0.7 cm, with two adjacent gallstones near the ampulla measuring 0.6 cm (series 3, image 28).  Pancreas: Unremarkable. No pancreatic ductal dilatation or surrounding inflammatory changes.  Spleen: Normal in size without significant abnormality.  Adrenals/Urinary Tract: Definitively benign macroscopic fat containing left adrenal adenoma, for which no further follow-up or characterization is required. Kidneys are normal, without renal calculi, solid lesion, or hydronephrosis.  Stomach/Bowel: Stomach is within normal limits. Descending duodenal diverticulum. No evidence of bowel wall thickening, distention, or inflammatory changes.  Vascular/Lymphatic: No significant vascular findings are present. No enlarged abdominal lymph nodes.  Other: No abdominal wall hernia.  Anasarca.  Trace ascites.  Musculoskeletal: No acute or significant osseous findings.  IMPRESSION: 1. Choledocholithiasis, two adjacent gallstones within the common bile duct near the ampulla measuring 0.6 cm. 2. Mildly distended gallbladder with multiple gallstones and mild gallbladder wall thickening. Findings are concerning for acute cholecystitis. 3. Cardiomegaly.  Anasarca and small bilateral pleural effusions. 4. Trace ascites.  These results will be called to the ordering clinician or representative by the Radiologist Assistant, and communication documented in the PACS or [employer].  Electronically Signed   By: Fredricka Jenny M.D.   On: 02/28/2024 20:25 ECHOCARDIOGRAM COMPLETE BUBBLE STUDY    ECHOCARDIOGRAM REPORT       Patient Name:   MCKYLE SOLANKI Date of Exam: 02/28/2024 Medical Rec #:  [id number]       Height:       68.0 in Accession #:    [id number]      Weight:       215.0 lb Date of Birth:  1946/04/13        BSA:          2.108  m Patient Age:    77 years        BP:           155/95 mmHg Patient Gender: M               HR:           87 bpm. Exam Location:  Inpatient  Procedure: 2D Echo, Cardiac Doppler and Color Doppler (Both Spectral and Color            Flow Doppler were utilized during procedure).  Indications:    Stroke I 63.9   History:        Patient has prior history of Echocardiogram examinations. Risk                 Factors:Hypertension.   Sonographer:    Jeralene Mom Referring Phys: [id number] GREGORY D CALONE  IMPRESSIONS   1. Left ventricular ejection fraction, by estimation, is 50 to 55%. The left ventricle has low normal function. The left ventricle has no regional wall motion abnormalities. Left ventricular diastolic parameters are consistent with Grade I diastolic  dysfunction (impaired relaxation).  2. Right ventricular systolic function is normal. The right ventricular size is mildly enlarged.  3. Left atrial size was mildly dilated.  4. The  mitral valve is normal in structure. Trivial mitral valve regurgitation. No evidence of mitral stenosis.  5. The aortic valve is tricuspid. Aortic valve regurgitation is trivial. Aortic valve sclerosis/calcification is present, without any evidence of aortic stenosis. Aortic valve area, by VTI measures 1.92 cm. Aortic valve mean gradient measures 4.0 mmHg.  Aortic valve Vmax measures 1.41 m/s.  6. The inferior vena cava is normal in size with greater than 50% respiratory variability, suggesting right atrial pressure of 3 mmHg.  7. Agitated saline contrast bubble study was negative, with no evidence of any interatrial shunt.  Conclusion(s)/Recommendation(s): No intracardiac source of embolism detected on this transthoracic study. Consider a transesophageal echocardiogram to exclude cardiac source of embolism if clinically indicated.  FINDINGS  Left Ventricle: Left ventricular ejection fraction, by estimation, is 50 to 55%. The left ventricle has low  normal function. The left ventricle has no regional wall motion abnormalities. The left ventricular internal cavity size was normal in size.  There is no left ventricular hypertrophy. Left ventricular diastolic parameters are consistent with Grade I diastolic dysfunction (impaired relaxation). Normal left ventricular filling pressure.  Right Ventricle: The right ventricular size is mildly enlarged. No increase in right ventricular wall thickness. Right ventricular systolic function is normal.  Left Atrium: Left atrial size was mildly dilated.  Right Atrium: Right atrial size was normal in size.  Pericardium: There is no evidence of pericardial effusion.  Mitral Valve: The mitral valve is normal in structure. Trivial mitral valve regurgitation. No evidence of mitral valve stenosis.  Tricuspid Valve: The tricuspid valve is normal in structure. Tricuspid valve regurgitation is not demonstrated. No evidence of tricuspid stenosis.  Aortic Valve: The aortic valve is tricuspid. Aortic valve regurgitation is trivial. Aortic valve sclerosis/calcification is present, without any evidence of aortic stenosis. Aortic valve mean gradient measures 4.0 mmHg. Aortic valve peak gradient  measures 8.0 mmHg. Aortic valve area, by VTI measures 1.92 cm.  Pulmonic Valve: The pulmonic valve was normal in structure. Pulmonic valve regurgitation is not visualized. No evidence of pulmonic stenosis.  Aorta: The aortic root is normal in size and structure.  Venous: The inferior vena cava is normal in size with greater than 50% respiratory variability, suggesting right atrial pressure of 3 mmHg.  IAS/Shunts: No atrial level shunt detected by color flow Doppler. Agitated saline contrast was given intravenously to evaluate for intracardiac shunting. Agitated saline contrast bubble study was negative, with no evidence of any interatrial shunt.    LEFT VENTRICLE PLAX 2D LVIDd:         4.10 cm   Diastology LVIDs:          3.00 cm   LV e' medial:    3.48 cm/s LV PW:         1.10 cm   LV E/e' medial:  12.2 LV IVS:        1.10 cm   LV e' lateral:   5.00 cm/s LVOT diam:     2.20 cm   LV E/e' lateral: 8.5 LV SV:         53 LV SV Index:   25 LVOT Area:     3.80 cm    RIGHT VENTRICLE RV Basal diam:  4.00 cm RV Mid diam:    3.90 cm RV S prime:     8.59 cm/s TAPSE (M-mode): 1.2 cm  LEFT ATRIUM             Index        RIGHT ATRIUM  Index LA diam:        3.90 cm 1.85 cm/m   RA Area:     13.20 cm LA Vol (A2C):   87.3 ml 41.42 ml/m  RA Volume:   22.00 ml  10.44 ml/m LA Vol (A4C):   70.5 ml 33.45 ml/m LA Biplane Vol: 77.9 ml 36.96 ml/m  AORTIC VALVE AV Area (Vmax):    2.06 cm AV Area (Vmean):   1.92 cm AV Area (VTI):     1.92 cm AV Vmax:           141.00 cm/s AV Vmean:          95.050 cm/s AV VTI:            0.276 m AV Peak Grad:      8.0 mmHg AV Mean Grad:      4.0 mmHg LVOT Vmax:         76.50 cm/s LVOT Vmean:        48.100 cm/s LVOT VTI:          0.140 m LVOT/AV VTI ratio: 0.51   AORTA Ao Root diam: 3.00 cm Ao Asc diam:  3.00 cm  MITRAL VALVE               TRICUSPID VALVE MV Area (PHT): 3.66 cm    TR Peak grad:   12.8 mmHg MV Decel Time: 207 msec    TR Vmax:        179.00 cm/s MR Peak grad: 44.6 mmHg MR Vmax:      334.00 cm/s  SHUNTS MV E velocity: 42.60 cm/s  Systemic VTI:  0.14 m MV A velocity: 59.50 cm/s  Systemic Diam: 2.20 cm MV E/A ratio:  0.72  Gaylyn Keas MD Electronically signed by Gaylyn Keas MD Signature Date/Time: 02/28/2024/3:16:01 PM      Final   DG CHEST PORT 1 VIEW CLINICAL DATA:  [id number].  Encounter for central line placement.  EXAM: PORTABLE CHEST 1 VIEW  COMPARISON:  Portable chest 05/19/2022, CTA chest yesterday at 9:31 p.m.  FINDINGS: 4:49 a.m. left IJ central line terminates about the superior cavoatrial junction with no visible pneumothorax.  The heart is slightly enlarged. The mediastinum is stable with aortic  atherosclerosis.  Central vascular prominence is present without edema. The lungs are hypoinflated but generally clear.  No new osseous findings.  Slight thoracic levoscoliosis.  IMPRESSION: 1. Left IJ central line terminates about the superior cavoatrial junction with no visible pneumothorax. 2. Hypoinflation with central vascular prominence but no edema. 3. Aortic atherosclerosis.  Electronically Signed   By: Denman Fischer M.D.   On: 02/28/2024 05:31 US  Abdomen Limited RUQ (LIVER/GB) CLINICAL DATA:  Epigastric pain.  EXAM: ULTRASOUND ABDOMEN LIMITED RIGHT UPPER QUADRANT  COMPARISON:  None Available.  FINDINGS: Gallbladder:  Shadowing, echogenic gallstones are seen within the gallbladder lumen. The largest measures approximately 2.2 cm. The gallbladder wall measures 3.8 mm in thickness. No sonographic Murphy sign noted by sonographer.  Common bile duct:  Diameter: 5.7 mm  Liver:  No focal lesion identified. Diffusely decreased echogenicity of the liver parenchyma is noted. Portal vein is patent on color Doppler imaging with normal direction of blood flow towards the liver.  Other: Technically limited study secondary to limited patient positioning, as per the ultrasound technologist.  IMPRESSION: 1. Cholelithiasis without evidence of acute cholecystitis.  Electronically Signed   By: Virgle Grime M.D.   On: 02/28/2024 00:35     Scheduled inpatient medications:   Chlorhexidine  Gluconate  Cloth  6 each Topical Daily   insulin  aspart  0-15 Units Subcutaneous TID WC   pantoprazole  (PROTONIX ) IV  40 mg Intravenous Q24H   sodium chloride  flush  10-40 mL Intracatheter Q12H   tamsulosin   0.4 mg Oral QPC supper   Continuous inpatient infusions:   heparin      piperacillin -tazobactam (ZOSYN )  IV 12.5 mL/hr at 03/01/24 0800   potassium chloride  10 mEq (03/01/24 0821)   PRN inpatient medications: docusate sodium , gadobutrol , melatonin, polyethylene glycol,  sodium chloride  flush, sodium chloride  flush  Vital signs in last 24 hours: Temp:  [96.7 F (35.9 C)-98.1 F (36.7 C)] 98.1 F (36.7 C) (05/31 0745) Pulse Rate:  [66-86] 73 (05/31 0800) Resp:  [12-25] 13 (05/31 0800) BP: (89-119)/(56-75) 97/67 (05/31 0800) SpO2:  [91 %-100 %] 96 % (05/31 0800) Arterial Line BP: (114-127)/(60-68) 114/60 (05/30 2018) Weight:  [100.7 kg] 100.7 kg (05/31 0253) Last BM Date : 03/01/24  Intake/Output Summary (Last 24 hours) at 03/01/2024 0916 Last data filed at 03/01/2024 0800 Gross per 24 hour  Intake 1738.35 ml  Output 850 ml  Net 888.35 ml    Intake/Output from previous day: 05/30 0701 - 05/31 0700 In: 1710.2 [P.O.:840; I.V.:699; IV Piggyback:171.2] Out: 850 [Urine:850] Intake/Output this shift: Total I/O In: 53.2 [IV Piggyback:53.2] Out: -    Physical Exam:  General: Alert male in NAD, in bedside recliner Heart:  Regular rate and rhythm.  Pulmonary: Normal respiratory effort Abdomen: Soft, nondistended, nontender. Normal bowel sounds. Extremities: 1+ BLE edema.   Neurologic: Alert and oriented Psych: Pleasant. Cooperative     LOS: 2 days   Mai Schwalbe ,NP 03/01/2024, 9:16 AM   I have taken an interval history, thoroughly reviewed the chart and examined the patient. I agree with the Advanced Practitioner's note, impression and recommendations, and have recorded additional findings, impressions and recommendations below. I performed a substantive portion of this encounter (>50% time spent), including a complete performance of the medical decision making.  My additional thoughts are as follows:  His abdominal pain is decreasing, he is hemodynamically stable off pressors, LFTs improving after yesterday's ERCP with stone extraction.  ERCP report reviewed and case discussed yesterday with Dr. Venice Gillis. Surgery is following this patient to determine the appropriate timing for cholecystectomy.  Continue antibiotics for polymicrobial  bacteremia and sepsis  Inpatient GI service signing off.  No specific GI office follow-up needed.     Kerby Pearson III Office:[phone]

## 2024-03-01 NOTE — Progress Notes (Signed)
 Progress Note   Patient: Omar [name] ZOX:[id number] DOB: 1946-03-30 DOA: 02/27/2024  DOS: the patient was seen and examined on 03/01/2024   Brief hospital course:  78 y/o male who has a PMH for DVT on Eliquis , DMT2, HTN, CAD s/p 1 coronary stent, Factor V Mutation, HLD, Obesity admitted with septic shock secondary to choledocholithiasis and pancreatitis with likely cholecystitis.  Initially PCCM, transition to [facility] 5/31 for pickup.  Assessment and Plan:  Septic shock with multiple species bacteremia - Initially requiring IV fluid boluses and vasopressor medications.  Vasopressors subsequently weaned off.  Blood pressure appears stable at this time.  Blood cultures noting Enterococcus faecalis, E. coli, Klebsiella.  Infectious disease following closely.  Continue Zosyn , plans for 2-week coverage starting 5/30.  1 week antibiotic coverage for Klebsiella/E. coli should be sufficient.  Acute pancreatitis/transaminitis secondary to choledocholithiasis - MRCP noting common bile duct stones.  ERCP 5/30 for stone extraction sphincterotomy.  Recommendations for antibiotics x 7 days.  Monitor for pancreatitis.  GI signing off today.  Likely need cholecystectomy.  Continues to have marked leukocytosis although improving.  Likely acute cholecystitis - Evaluated by general surgery.  ERCP yesterday, awaiting for labs to improve slightly.  N.p.o. after midnight with possible lap chole in a.m. versus Monday.  Acute kidney injury - Creatinine appears mostly stable, mildly improved.  IV fluids on board.  Recheck BMP in AM.  Subjective: Patient resting comfortably in bed this morning, states he feels markedly improved.  Denies any fever, chills, chest pain, nausea, vomiting, abdominal pain.  Showing marked improvement from presentation.  Physical Exam:  Vitals:   03/01/24 1100 03/01/24 1131 03/01/24 1200 03/01/24 1300  BP: 104/75  120/89 111/76  Pulse: 83 88 (!) 101 88  Resp: (!) 39 20 19 18   Temp:   98.4 F (36.9 C)    TempSrc:  Oral    SpO2: 91% 94% 94% 96%  Weight:      Height:        GENERAL:  Alert, pleasant, no acute distress  HEENT:  EOMI CARDIOVASCULAR:  RRR, no murmurs appreciated RESPIRATORY:  Clear to auscultation, no wheezing, rales, or rhonchi GASTROINTESTINAL:  Soft, nontender, nondistended EXTREMITIES:  No LE edema bilaterally NEURO:  No new focal deficits appreciated SKIN:  No rashes noted PSYCH:  Appropriate mood and affect     Data Reviewed:  No new imaging to review  Previous records (including but not limited to H&P, progress notes, nursing notes, TOC management) were reviewed in assessment of this patient.  Labs: CBC: Recent Labs  Lab 02/27/24 2054 02/28/24 0523 02/29/24 0312 03/01/24 0253  WBC 19.3* 26.3* 29.7* 23.7*  NEUTROABS 18.5*  --   --   --   HGB 17.0 13.7 12.8* 12.6*  HCT 51.4 40.8 38.1* 37.6*  MCV 91.0 90.5 89.6 90.4  PLT 318 195 129* 119*   Basic Metabolic Panel: Recent Labs  Lab 02/27/24 2240 02/28/24 0230 02/28/24 0523 02/29/24 0312 03/01/24 0253  NA 139  --  140 135 137  K 3.8  --  3.4* 3.9 3.6  CL 108  --  107 104 106  CO2 18*  --  19* 20* 23  GLUCOSE 157*  --  178* 150* 95  BUN 19  --  24* 28* 34*  CREATININE 1.50*  --  1.75* 1.48* 1.47*  CALCIUM  8.3*  --  8.6* 8.2* 8.2*  MG  --  1.4*  --   --   --    Liver Function Tests:  Recent Labs  Lab 02/27/24 2240 02/28/24 0523 02/29/24 0312 03/01/24 0253  AST 329* 257* 126* 66*  ALT 262* 249* 189* 131*  ALKPHOS 141* 115 97 87  BILITOT 3.0* 3.1* 1.7* 1.0  PROT 4.8* 4.6* 4.8* 4.9*  ALBUMIN 2.5* 2.4* 2.3* 2.2*   CBG: Recent Labs  Lab 02/29/24 1136 02/29/24 1636 02/29/24 2205 03/01/24 0744 03/01/24 1129  GLUCAP 120* 152* 124* 91 142*    Scheduled Meds:  Chlorhexidine  Gluconate Cloth  6 each Topical Daily   insulin  aspart  0-15 Units Subcutaneous TID WC   pantoprazole  (PROTONIX ) IV  40 mg Intravenous Q24H   sodium chloride  flush  10-40 mL Intracatheter  Q12H   tamsulosin   0.4 mg Oral QPC supper   Continuous Infusions:  heparin  1,000 Units/hr (03/01/24 1034)   piperacillin -tazobactam (ZOSYN )  IV 12.5 mL/hr at 03/01/24 0800   PRN Meds:.docusate sodium , gadobutrol , melatonin, polyethylene glycol, sodium chloride  flush, sodium chloride  flush  Family Communication: Encounter recorded by patient for family playback  Disposition: Status is: Inpatient Remains inpatient appropriate because: Septic shock     Time spent: 52 minutes  Length of inpatient stay: 2 days  Author: Jodeane Mulligan, DO 03/01/2024 1:45 PM  For on call review [email or web address].

## 2024-03-01 NOTE — Progress Notes (Addendum)
 PHARMACY - ANTICOAGULATION CONSULT NOTE  Pharmacy Consult for Heparin  while Eliquis  on hold Indication: Hx DVT  Allergies  Allergen Reactions   Empagliflozin      Patient Measurements: Height: 5\' 8"  (172.7 cm) Weight: 100.7 kg (222 lb 0.1 oz) IBW/kg (Calculated) : 68.4 HEPARIN  DW (KG): 89.1  Vital Signs: Temp: 98.1 F (36.7 C) (05/31 0745) Temp Source: Oral (05/31 0745) BP: 97/67 (05/31 0800) Pulse Rate: 73 (05/31 0800)  Labs: Recent Labs    02/28/24 0230 02/28/24 0523 02/28/24 0949 02/28/24 1711 02/29/24 0312 02/29/24 1021 03/01/24 0253  HGB  --  13.7  --   --  12.8*  --  12.6*  HCT  --  40.8  --   --  38.1*  --  37.6*  PLT  --  195  --   --  129*  --  119*  APTT 142*  --  38* 162* 118*  --   --   LABPROT 22.3*  --   --   --   --  19.4*  --   INR 1.9*  --   --   --   --  1.6*  --   HEPARINUNFRC  --   --  0.53 >1.10* 0.87*  --   --   CREATININE  --  1.75*  --   --  1.48*  --  1.47*    Estimated Creatinine Clearance: 46.9 mL/min (A) (by C-G formula based on SCr of 1.47 mg/dL (H)).   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 Pharmacy consulted to transition to IV heparin  while NPO. Last dose of Eliquis  taken 5/27 PM. Will utilize aPTT to monitor heparin  as heparin  levels will be falsely elevated due to recent DOAC use.  S/p ERCP with removal of choledocholithiasis and Pharmacy to resume IV heparin  12 hrs post procedure per GI. Heparin  continued to be held d/t possible lap chole.   Goal of Therapy:  Heparin  level 0.3-0.7 units/ml aPTT 66-102 seconds Monitor platelets by anticoagulation protocol: Yes   Plan:  Heparin  on hold currently - gen surg team to follow up with pharmacy and bedside RN regarding surgical timing and when/if ok to restart heparin  today 5/31 Orders discontinued in the meantime   Oralee Billow, PharmD, BCCCP Clinical Pharmacist 03/01/2024 10:07 AM   ---Addendum---- Okay to restart heparin   gtt per surgery. Planning tentatively for OR on Monday 6/2.  Restart heparin  at 1000 units/hr 8hr HL , aPTT with recent DOAC Trend both until clinically correlated Daily HL, CBC  Oralee Billow, PharmD, BCCCP Clinical Pharmacist 03/01/2024 10:14 AM

## 2024-03-02 ENCOUNTER — Encounter (HOSPITAL_COMMUNITY)
Admission: EM | Disposition: A | Discharge status: Home / Self Care | Payer: Self-pay | Source: Ambulatory Visit | Attending: Internal Medicine

## 2024-03-02 DIAGNOSIS — R7881 Bacteremia: Secondary | ICD-10-CM | POA: Diagnosis not present

## 2024-03-02 DIAGNOSIS — K851 Biliary acute pancreatitis without necrosis or infection: Secondary | ICD-10-CM | POA: Diagnosis not present

## 2024-03-02 DIAGNOSIS — K8050 Calculus of bile duct without cholangitis or cholecystitis without obstruction: Secondary | ICD-10-CM

## 2024-03-02 DIAGNOSIS — R6521 Severe sepsis with septic shock: Secondary | ICD-10-CM | POA: Diagnosis not present

## 2024-03-02 LAB — C DIFFICILE (CDIFF) QUICK SCRN (NO PCR REFLEX)
C Diff antigen: POSITIVE — AB
C Diff toxin: NEGATIVE

## 2024-03-02 LAB — COMPREHENSIVE METABOLIC PANEL WITH GFR
ALT: 95 U/L — ABNORMAL HIGH (ref 0–44)
AST: 41 U/L (ref 15–41)
Albumin: 2.2 g/dL — ABNORMAL LOW (ref 3.5–5.0)
Alkaline Phosphatase: 102 U/L (ref 38–126)
Anion gap: 6 (ref 5–15)
BUN: 25 mg/dL — ABNORMAL HIGH (ref 8–23)
CO2: 24 mmol/L (ref 22–32)
Calcium: 8.5 mg/dL — ABNORMAL LOW (ref 8.9–10.3)
Chloride: 110 mmol/L (ref 98–111)
Creatinine, Ser: 1.26 mg/dL — ABNORMAL HIGH (ref 0.61–1.24)
GFR, Estimated: 58 mL/min — ABNORMAL LOW (ref >60)
Glucose, Bld: 127 mg/dL — ABNORMAL HIGH (ref 70–99)
Potassium: 4 mmol/L (ref 3.5–5.1)
Sodium: 140 mmol/L (ref 135–145)
Total Bilirubin: 0.8 mg/dL (ref 0.0–1.2)
Total Protein: 5 g/dL — ABNORMAL LOW (ref 6.5–8.1)

## 2024-03-02 LAB — CBC
HCT: 39.6 % (ref 39.0–52.0)
Hemoglobin: 13.3 g/dL (ref 13.0–17.0)
MCH: 30.5 pg (ref 26.0–34.0)
MCHC: 33.6 g/dL (ref 30.0–36.0)
MCV: 90.8 fL (ref 80.0–100.0)
Platelets: 113 10*3/uL — ABNORMAL LOW (ref 150–400)
RBC: 4.36 MIL/uL (ref 4.22–5.81)
RDW: 15.3 % (ref 11.5–15.5)
WBC: 16.7 10*3/uL — ABNORMAL HIGH (ref 4.0–10.5)
nRBC: 0 % (ref 0.0–0.2)

## 2024-03-02 LAB — HEPARIN LEVEL (UNFRACTIONATED): Heparin Unfractionated: 0.18 [IU]/mL — ABNORMAL LOW (ref 0.30–0.70)

## 2024-03-02 LAB — GLUCOSE, CAPILLARY
Glucose-Capillary: 119 mg/dL — ABNORMAL HIGH (ref 70–99)
Glucose-Capillary: 111 mg/dL — ABNORMAL HIGH (ref 70–99)
Glucose-Capillary: 230 mg/dL — ABNORMAL HIGH (ref 70–99)
Glucose-Capillary: 145 mg/dL — ABNORMAL HIGH (ref 70–99)

## 2024-03-02 LAB — LIPASE, BLOOD: Lipase: 272 U/L — ABNORMAL HIGH (ref 11–51)

## 2024-03-02 LAB — MAGNESIUM: Magnesium: 2.2 mg/dL (ref 1.7–2.4)

## 2024-03-02 LAB — APTT: aPTT: 41 s — ABNORMAL HIGH (ref 24–36)

## 2024-03-02 SURGERY — LAPAROSCOPIC CHOLECYSTECTOMY
Anesthesia: General

## 2024-03-02 MED ORDER — ORAL CARE MOUTH RINSE: 15.0000 mL | OROMUCOSAL | Status: DC | PRN

## 2024-03-02 MED ORDER — SODIUM CHLORIDE 0.9 % IV SOLN
3.0000 g | Freq: Four times a day (QID) | INTRAVENOUS | Status: DC
  Administered 2024-03-02 – 2024-03-04 (×7): 3 g via INTRAVENOUS
  Filled 2024-03-02 (×8): qty 8

## 2024-03-02 MED ORDER — MUPIROCIN 2 % EX OINT
1.0000 | TOPICAL_OINTMENT | Freq: Two times a day (BID) | CUTANEOUS | Status: DC
Start: 2024-03-03 — End: 2024-03-07

## 2024-03-02 MED ORDER — HEPARIN (PORCINE) 25000 UT/250ML-% IV SOLN
1200.0000 [IU]/h | INTRAVENOUS | Status: AC
  Administered 2024-03-02: 1200 [IU]/h via INTRAVENOUS
  Filled 2024-03-02: qty 250

## 2024-03-02 NOTE — Plan of Care (Signed)
  Problem: Clinical Measurements: Goal: Ability to maintain clinical measurements within normal limits will improve Outcome: Progressing Goal: Will remain free from infection Outcome: Progressing Goal: Diagnostic test results will improve Outcome: Progressing Goal: Cardiovascular complication will be avoided Outcome: Progressing   Problem: Activity: Goal: Risk for activity intolerance will decrease Outcome: Progressing   Problem: Nutrition: Goal: Adequate nutrition will be maintained Outcome: Progressing   Problem: Pain Managment: Goal: General experience of comfort will improve and/or be controlled Outcome: Progressing

## 2024-03-02 NOTE — Plan of Care (Addendum)
 Id brief note   Repeat bcx ngtd   Finish 2 weeks abx as mentioned --> change piptazo to amp-sulbactam when taking po well can transition to augmentin: last dose 03/14/24 finish 2 weeks for the e faecalis No IE w/u needed Rest of management per surgery Id will sign off Discussed with primary team

## 2024-03-02 NOTE — Progress Notes (Signed)
 PHARMACY - ANTICOAGULATION CONSULT NOTE  Pharmacy Consult for Heparin  while Eliquis  on hold Indication: Hx DVT  Allergies  Allergen Reactions   Empagliflozin      Patient Measurements: Height: 5\' 8"  (172.7 cm) Weight: 100.8 kg (222 lb 3.6 oz) IBW/kg (Calculated) : 68.4 HEPARIN  DW (KG): 89.1  Vital Signs: Temp: 98 F (36.7 C) (06/01 0740) Temp Source: Oral (06/01 0740) BP: 109/78 (06/01 1000) Pulse Rate: 86 (06/01 1000)  Labs: Recent Labs    02/28/24 1711 02/29/24 0312 02/29/24 0312 02/29/24 1021 03/01/24 0253 03/01/24 1800 03/02/24 0350  HGB  --  12.8*   < >  --  12.6*  --  13.3  HCT  --  38.1*  --   --  37.6*  --  39.6  PLT  --  129*  --   --  119*  --  113*  APTT 162* 118*  --   --   --  35  --   LABPROT  --   --   --  19.4*  --   --   --   INR  --   --   --  1.6*  --   --   --   HEPARINUNFRC >1.10* 0.87*  --   --   --  0.19*  --   CREATININE  --  1.48*  --   --  1.47*  --  1.26*   < > = values in this interval not displayed.    Estimated Creatinine Clearance: 54.7 mL/min (A) (by C-G formula based on SCr of 1.26 mg/dL (H)).   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 Pharmacy consulted to transition to IV heparin  while NPO. Last dose of Eliquis  taken 5/27 PM. Will utilize aPTT to monitor heparin  as heparin  levels will be falsely elevated due to recent DOAC use.  S/p ERCP with removal of choledocholithiasis, ok to resume heparin  gtt per surgery. Planned for OR on 6/2. Stop at midnight prior to OR.   Goal of Therapy:  Heparin  level 0.3-0.7 units/ml aPTT 66-102 seconds Monitor platelets by anticoagulation protocol: Yes   Plan:  Restart heparin  at 1200 units/h - stop at 0000 6/2 8hr HL , aPTT Daily HL, aPTT, CBC F/U [facility] plans postop  Oralee Billow, PharmD, BCCCP Clinical Pharmacist 03/02/2024 11:23 AM

## 2024-03-02 NOTE — Progress Notes (Signed)
 2 Days Post-Op   Subjective/Chief Complaint: Feels well,no ab pain   Objective: Vital signs in last 24 hours: Temp:  [97.9 F (36.6 C)-98.4 F (36.9 C)] 98 F (36.7 C) (06/01 0740) Pulse Rate:  [57-101] 86 (06/01 1000) Resp:  [13-24] 17 (06/01 1000) BP: (104-149)/(57-93) 109/78 (06/01 1000) SpO2:  [93 %-99 %] 93 % (06/01 1000) Weight:  [100.8 kg] 100.8 kg (06/01 0231) Last BM Date : 03/01/24  Intake/Output from previous day: 05/31 0701 - 06/01 0700 In: 1694.6 [P.O.:1320; I.V.:185.8; IV Piggyback:188.8] Out: 1350 [Urine:1350] Intake/Output this shift: No intake/output data recorded.  Ab soft nontender  Lab Results:  Recent Labs    03/01/24 0253 03/02/24 0350  WBC 23.7* 16.7*  HGB 12.6* 13.3  HCT 37.6* 39.6  PLT 119* 113*   BMET Recent Labs    03/01/24 0253 03/02/24 0350  NA 137 140  K 3.6 4.0  CL 106 110  CO2 23 24  GLUCOSE 95 127*  BUN 34* 25*  CREATININE 1.47* 1.26*  CALCIUM  8.2* 8.5*   PT/INR Recent Labs    02/29/24 1021  LABPROT 19.4*  INR 1.6*   ABG No results for input(s): "PHART", "HCO3" in the last 72 hours.  Invalid input(s): "PCO2", "PO2"  Studies/Results: No results found.  Anti-infectives: Anti-infectives (From admission, onward)    Start     Dose/Rate Route Frequency Ordered Stop   02/28/24 1400  piperacillin -tazobactam (ZOSYN ) IVPB 3.375 g        3.375 g 12.5 mL/hr over 240 Minutes Intravenous Every 8 hours 02/28/24 0842     02/28/24 1000  metroNIDAZOLE  (FLAGYL ) IVPB 500 mg  Status:  Discontinued        500 mg 100 mL/hr over 60 Minutes Intravenous Every 12 hours 02/28/24 0045 02/28/24 0828   02/28/24 1000  ceFEPIme  (MAXIPIME ) 2 g in sodium chloride  0.9 % 100 mL IVPB  Status:  Discontinued        2 g 200 mL/hr over 30 Minutes Intravenous Every 12 hours 02/28/24 0047 02/28/24 0840   02/28/24 0930  piperacillin -tazobactam (ZOSYN ) IVPB 3.375 g  Status:  Discontinued        3.375 g 12.5 mL/hr over 240 Minutes Intravenous Every  8 hours 02/28/24 0840 02/28/24 0842   02/28/24 0930  piperacillin -tazobactam (ZOSYN ) IVPB 3.375 g        3.375 g 100 mL/hr over 30 Minutes Intravenous  Once 02/28/24 0842 02/28/24 0944   02/28/24 0100  vancomycin  (VANCOREADY) IVPB 1750 mg/350 mL  Status:  Discontinued        1,750 mg 175 mL/hr over 120 Minutes Intravenous Every 48 hours 02/28/24 0050 02/28/24 0823   02/27/24 2215  ceFEPIme  (MAXIPIME ) 2 g in sodium chloride  0.9 % 100 mL IVPB        2 g 200 mL/hr over 30 Minutes Intravenous  Once 02/27/24 2212 02/27/24 2328   02/27/24 2215  metroNIDAZOLE  (FLAGYL ) IVPB 500 mg        500 mg 100 mL/hr over 60 Minutes Intravenous  Once 02/27/24 2212 02/28/24 0031       Assessment/Plan: GSP, choledocholithiasis s/p ercp -doing well today, cannot take to OR today due to emergencies -will let him have diet and plan for OR tomorrow, npo after mn -discussed plan with he and his wife  I reviewed hospitalist notes, last 24 h vitals and pain scores, last 48 h intake and output, last 24 h labs and trends, and last 24 h imaging results.    Omar [name] 03/02/2024

## 2024-03-02 NOTE — Anesthesia Preprocedure Evaluation (Signed)
 Anesthesia Evaluation  Patient identified by MRN, date of birth, ID band Patient awake    Reviewed: Allergy & Precautions, NPO status , Patient's Chart, lab work & pertinent test results  History of Anesthesia Complications Negative for: history of anesthetic complications  Airway Mallampati: III  TM Distance: >3 FB Neck ROM: Full    Dental no notable dental hx. (+) Dental Advisory Given, Edentulous Upper   Pulmonary sleep apnea    Pulmonary exam normal breath sounds clear to auscultation       Cardiovascular hypertension, Pt. on medications + CAD and + Cardiac Stents  Normal cardiovascular exam Rhythm:Regular Rate:Normal  02/28/2024 TTE Left ventricle: The cavity size was normal. Wall thickness    was normal. Systolic function was normal. The estimated    ejection fraction was in the range of 55% to 60%. Although    no diagnostic regional wall motion abnormality was    identified, this possibility cannot be completely excluded    on the basis of this study. Doppler parameters are    consistent with abnormal left ventricular relaxation    (grade 1 diastolic dysfunction).  - Aortic valve: Poorly visualized. Mildly calcified annulus.    Mildly thickened leaflets. No significant regurgitation.  - Mitral valve: Calcified annulus. Trivial regurgitation.  - Left atrium: The atrium was mildly dilated.  - Tricuspid valve: Trivial regurgitation.  - Pericardium, extracardiac: A possible, small pericardial    effusion was identified.    Left ventricle: The cavity size was normal. Wall thickness    was normal. Systolic function was normal. The estimated    ejection fraction was in the range of 55% to 60%. Although    no diagnostic regional wall motion abnormality was    identified, this possibility cannot be completely excluded    on the basis of this study. Doppler parameters are    consistent with abnormal left ventricular  relaxation    (grade 1 diastolic dysfunction).  - Aortic valve: Poorly visualized. Mildly calcified annulus.    Mildly thickened leaflets. No significant regurgitation.  - Mitral valve: Calcified annulus. Trivial regurgitation.  - Left atrium: The atrium was mildly dilated.  - Tricuspid valve: Trivial regurgitation.  - Pericardium, extracardiac: A possible, small pericardial    effusion was identified.     Neuro/Psych neg Seizures CVA, No Residual Symptoms    GI/Hepatic Choledocholithiasis   Lab Results      Component                Value               Date                      ALT                      189 (H)             02/29/2024                AST                      126 (H)             02/29/2024                ALKPHOS                  97  02/29/2024                BILITOT                  1.7 (H)             02/29/2024              Endo/Other  diabetes    Renal/GU ARFRenal diseaseLab Results      Component                Value               Date                      K                        4.0                 03/02/2024                 BUN                      25 (H)              03/02/2024                CREATININE               1.26 (H)            03/02/2024                GFRNONAA                 58 (L)              03/02/2024                CALCIUM                   8.5 (L)             03/02/2024                PHOS                     2.9                 08/07/2017                ALBUMIN                  2.2 (L)             03/02/2024                GLUCOSE                  127 (H)             03/02/2024                      Musculoskeletal  (+) Arthritis ,    Abdominal   Peds  Hematology  (+) Blood dyscrasia, anemia Lab Results      Component                Value               Date  WBC                      16.7 (H)            03/02/2024                HGB                      13.3                03/02/2024                 HCT                      39.6                03/02/2024                MCV                      90.8                03/02/2024                PLT                      113 (L)             03/02/2024                eliquis    Anesthesia Other Findings All: emPagilflozin  Reproductive/Obstetrics                             Anesthesia Physical Anesthesia Plan  ASA: 3  Anesthesia Plan: General   Post-op Pain Management: Precedex and Toradol  IV (intra-op)*   Induction: Intravenous  PONV Risk Score and Plan: 2 and Treatment may vary due to age or medical condition and Ondansetron   Airway Management Planned: Oral ETT  Additional Equipment: None  Intra-op Plan:   Post-operative Plan: Extubation in OR  Informed Consent: I have reviewed the patients History and Physical, chart, labs and discussed the procedure including the risks, benefits and alternatives for the proposed anesthesia with the patient or authorized representative who has indicated his/her understanding and acceptance.     Dental advisory given  Plan Discussed with: CRNA and Surgeon  Anesthesia Plan Comments:         Anesthesia Quick Evaluation

## 2024-03-02 NOTE — Progress Notes (Signed)
 PHARMACY - ANTICOAGULATION CONSULT NOTE  Pharmacy Consult for Heparin  while Eliquis  on hold Indication: Hx DVT  Allergies  Allergen Reactions   Empagliflozin      Patient Measurements: Height: 5\' 8"  (172.7 cm) Weight: 100.8 kg (222 lb 3.6 oz) IBW/kg (Calculated) : 68.4 HEPARIN  DW (KG): 89.1  Vital Signs: Temp: 98.2 F (36.8 C) (06/01 1943) Temp Source: Oral (06/01 1943) BP: 131/72 (06/01 2100) Pulse Rate: 77 (06/01 2100)  Labs: Recent Labs    02/29/24 0312 02/29/24 1021 03/01/24 0253 03/01/24 1800 03/02/24 0350 03/02/24 1900  HGB 12.8*  --  12.6*  --  13.3  --   HCT 38.1*  --  37.6*  --  39.6  --   PLT 129*  --  119*  --  113*  --   APTT 118*  --   --  35  --  41*  LABPROT  --  19.4*  --   --   --   --   INR  --  1.6*  --   --   --   --   HEPARINUNFRC 0.87*  --   --  0.19*  --  0.18*  CREATININE 1.48*  --  1.47*  --  1.26*  --     Estimated Creatinine Clearance: 54.7 mL/min (A) (by C-G formula based on SCr of 1.26 mg/dL (H)).   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 Pharmacy consulted to transition to IV heparin  while NPO. Last dose of Eliquis  taken 5/27 PM. Will utilize aPTT to monitor heparin  as heparin  levels will be falsely elevated due to recent DOAC use.  S/p ERCP with removal of choledocholithiasis, ok to resume heparin  gtt per surgery. Planned for OR on 6/2. Stop at midnight prior to OR. Heparin  level and aPTT both low but will leave as is for now since stopping in 2h.  Goal of Therapy:  Heparin  level 0.3-0.7 units/ml aPTT 66-102 seconds Monitor platelets by anticoagulation protocol: Yes   Plan:  Heparin  1200 units/h no changes, stop at MN  Levin Reamer, PharmD, BCPS, [facility] Clinical Pharmacist [phone] Please check AMION for all [facility] Pharmacy numbers 03/02/2024

## 2024-03-02 NOTE — H&P (View-Only) (Signed)
 2 Days Post-Op   Subjective/Chief Complaint: Feels well,no ab pain   Objective: Vital signs in last 24 hours: Temp:  [97.9 F (36.6 C)-98.4 F (36.9 C)] 98 F (36.7 C) (06/01 0740) Pulse Rate:  [57-101] 86 (06/01 1000) Resp:  [13-24] 17 (06/01 1000) BP: (104-149)/(57-93) 109/78 (06/01 1000) SpO2:  [93 %-99 %] 93 % (06/01 1000) Weight:  [100.8 kg] 100.8 kg (06/01 0231) Last BM Date : 03/01/24  Intake/Output from previous day: 05/31 0701 - 06/01 0700 In: 1694.6 [P.O.:1320; I.V.:185.8; IV Piggyback:188.8] Out: 1350 [Urine:1350] Intake/Output this shift: No intake/output data recorded.  Ab soft nontender  Lab Results:  Recent Labs    03/01/24 0253 03/02/24 0350  WBC 23.7* 16.7*  HGB 12.6* 13.3  HCT 37.6* 39.6  PLT 119* 113*   BMET Recent Labs    03/01/24 0253 03/02/24 0350  NA 137 140  K 3.6 4.0  CL 106 110  CO2 23 24  GLUCOSE 95 127*  BUN 34* 25*  CREATININE 1.47* 1.26*  CALCIUM  8.2* 8.5*   PT/INR Recent Labs    02/29/24 1021  LABPROT 19.4*  INR 1.6*   ABG No results for input(s): "PHART", "HCO3" in the last 72 hours.  Invalid input(s): "PCO2", "PO2"  Studies/Results: No results found.  Anti-infectives: Anti-infectives (From admission, onward)    Start     Dose/Rate Route Frequency Ordered Stop   02/28/24 1400  piperacillin -tazobactam (ZOSYN ) IVPB 3.375 g        3.375 g 12.5 mL/hr over 240 Minutes Intravenous Every 8 hours 02/28/24 0842     02/28/24 1000  metroNIDAZOLE  (FLAGYL ) IVPB 500 mg  Status:  Discontinued        500 mg 100 mL/hr over 60 Minutes Intravenous Every 12 hours 02/28/24 0045 02/28/24 0828   02/28/24 1000  ceFEPIme  (MAXIPIME ) 2 g in sodium chloride  0.9 % 100 mL IVPB  Status:  Discontinued        2 g 200 mL/hr over 30 Minutes Intravenous Every 12 hours 02/28/24 0047 02/28/24 0840   02/28/24 0930  piperacillin -tazobactam (ZOSYN ) IVPB 3.375 g  Status:  Discontinued        3.375 g 12.5 mL/hr over 240 Minutes Intravenous Every  8 hours 02/28/24 0840 02/28/24 0842   02/28/24 0930  piperacillin -tazobactam (ZOSYN ) IVPB 3.375 g        3.375 g 100 mL/hr over 30 Minutes Intravenous  Once 02/28/24 0842 02/28/24 0944   02/28/24 0100  vancomycin  (VANCOREADY) IVPB 1750 mg/350 mL  Status:  Discontinued        1,750 mg 175 mL/hr over 120 Minutes Intravenous Every 48 hours 02/28/24 0050 02/28/24 0823   02/27/24 2215  ceFEPIme  (MAXIPIME ) 2 g in sodium chloride  0.9 % 100 mL IVPB        2 g 200 mL/hr over 30 Minutes Intravenous  Once 02/27/24 2212 02/27/24 2328   02/27/24 2215  metroNIDAZOLE  (FLAGYL ) IVPB 500 mg        500 mg 100 mL/hr over 60 Minutes Intravenous  Once 02/27/24 2212 02/28/24 0031       Assessment/Plan: GSP, choledocholithiasis s/p ercp -doing well today, cannot take to OR today due to emergencies -will let him have diet and plan for OR tomorrow, npo after mn -discussed plan with he and his wife  I reviewed hospitalist notes, last 24 h vitals and pain scores, last 48 h intake and output, last 24 h labs and trends, and last 24 h imaging results.    Enid Harry 03/02/2024

## 2024-03-02 NOTE — Progress Notes (Signed)
 Progress Note   Patient: Omar [name] WUJ:[id number] DOB: 1946/08/31 DOA: 02/27/2024  DOS: the patient was seen and examined on 03/02/2024   Brief hospital course:  78 y/o male who has a PMH for DVT on Eliquis , DMT2, HTN, CAD s/p 1 coronary stent, Factor V Mutation, HLD, Obesity admitted with septic shock secondary to choledocholithiasis and pancreatitis with likely cholecystitis.  Initially PCCM, transition to TRH 5/31 for pickup.   Assessment and Plan:   Septic shock with multiple species bacteremia - Initially requiring IV fluid boluses and vasopressor medications.  Vasopressors subsequently weaned off.  Blood pressure appears stable at this time.  Blood cultures noting Enterococcus faecalis, E. coli, Klebsiella.  Infectious disease following closely.  Zosyn  transition to Unasyn, plans for 2-week coverage starting 5/30.  1 week antibiotic coverage for Klebsiella/E. coli should be sufficient.   Acute pancreatitis/transaminitis secondary to choledocholithiasis - MRCP noting common bile duct stones.  ERCP 5/30 for stone extraction sphincterotomy.  Recommendations for antibiotics x 7 days.  Monitor for pancreatitis.  GI signing off.  Pending cholecystectomy either today or tomorrow.  Continues to have marked leukocytosis although improving.   Likely acute cholecystitis - Evaluated by general surgery.  ERCP yesterday, awaiting for labs to improve slightly.  N.p.o. after midnight with possible lap chole in a.m..   Acute kidney injury - Creatinine appears mostly stable, mildly improved.  IV fluids on board.  Recheck BMP in AM.   Subjective: Patient resting comfortably in bedside chair, states he feels markedly improved. Denies any fever, chills, chest pain, nausea, vomiting, abdominal pain.   Physical Exam:  Vitals:   03/02/24 1125 03/02/24 1200 03/02/24 1300 03/02/24 1400  BP:  116/70 107/77 (!) 127/104  Pulse:  83 90 (!) 108  Resp:  (!) 23 (!) 25 (!) 21  Temp: 98.3 F (36.8 C)      TempSrc: Oral     SpO2:  94% 95% 91%  Weight:      Height:        GENERAL:  Alert, pleasant, no acute distress  HEENT:  EOMI CARDIOVASCULAR:  RRR, no murmurs appreciated RESPIRATORY:  Clear to auscultation, no wheezing, rales, or rhonchi GASTROINTESTINAL:  Soft, nontender, nondistended EXTREMITIES:  No LE edema bilaterally NEURO:  No new focal deficits appreciated SKIN:  No rashes noted PSYCH:  Appropriate mood and affect    Data Reviewed:  No new imaging  Previous records (including but not limited to H&P, progress notes, nursing notes, TOC management) were reviewed in assessment of this patient.  Labs: CBC: Recent Labs  Lab 02/27/24 2054 02/28/24 0523 02/29/24 0312 03/01/24 0253 03/02/24 0350  WBC 19.3* 26.3* 29.7* 23.7* 16.7*  NEUTROABS 18.5*  --   --   --   --   HGB 17.0 13.7 12.8* 12.6* 13.3  HCT 51.4 40.8 38.1* 37.6* 39.6  MCV 91.0 90.5 89.6 90.4 90.8  PLT 318 195 129* 119* 113*   Basic Metabolic Panel: Recent Labs  Lab 02/27/24 2240 02/28/24 0230 02/28/24 0523 02/29/24 0312 03/01/24 0253 03/02/24 0350  NA 139  --  140 135 137 140  K 3.8  --  3.4* 3.9 3.6 4.0  CL 108  --  107 104 106 110  CO2 18*  --  19* 20* 23 24  GLUCOSE 157*  --  178* 150* 95 127*  BUN 19  --  24* 28* 34* 25*  CREATININE 1.50*  --  1.75* 1.48* 1.47* 1.26*  CALCIUM  8.3*  --  8.6* 8.2* 8.2*  8.5*  MG  --  1.4*  --   --   --  2.2   Liver Function Tests: Recent Labs  Lab 02/27/24 2240 02/28/24 0523 02/29/24 0312 03/01/24 0253 03/02/24 0350  AST 329* 257* 126* 66* 41  ALT 262* 249* 189* 131* 95*  ALKPHOS 141* 115 97 87 102  BILITOT 3.0* 3.1* 1.7* 1.0 0.8  PROT 4.8* 4.6* 4.8* 4.9* 5.0*  ALBUMIN 2.5* 2.4* 2.3* 2.2* 2.2*   CBG: Recent Labs  Lab 03/01/24 1129 03/01/24 1536 03/01/24 2108 03/02/24 0738 03/02/24 1124  GLUCAP 142* 187* 160* 119* 111*    Scheduled Meds:  Chlorhexidine  Gluconate Cloth  6 each Topical Daily   insulin  aspart  0-15 Units Subcutaneous TID  WC   pantoprazole  (PROTONIX ) IV  40 mg Intravenous Q24H   sodium chloride  flush  10-40 mL Intracatheter Q12H   tamsulosin   0.4 mg Oral QPC supper   Continuous Infusions:  heparin  1,200 Units/hr (03/02/24 1400)   PRN Meds:.docusate sodium , gadobutrol , loperamide, melatonin, mouth rinse, polyethylene glycol, sodium chloride  flush, sodium chloride  flush  Family Communication: None at bedside  Disposition: Status is: Inpatient Remains inpatient appropriate because: Sepsis     Time spent: 34 minutes  Length of inpatient stay: 3 days  Author: Jodeane Mulligan, DO 03/02/2024 3:05 PM  For on call review [email or web address].

## 2024-03-02 NOTE — Anesthesia Postprocedure Evaluation (Signed)
 Anesthesia Post Note  Patient: Omar [name]  Procedure(s) Performed: ERCP, WITH INTERVENTION IF INDICATED     Patient location during evaluation: PACU Anesthesia Type: General Level of consciousness: awake and alert Pain management: pain level controlled Vital Signs Assessment: post-procedure vital signs reviewed and stable Respiratory status: spontaneous breathing, nonlabored ventilation and respiratory function stable Cardiovascular status: blood pressure returned to baseline and stable Postop Assessment: no apparent nausea or vomiting Anesthetic complications: no   No notable events documented.               Kelilah Hebard

## 2024-03-03 ENCOUNTER — Encounter (HOSPITAL_COMMUNITY): Payer: Self-pay

## 2024-03-03 ENCOUNTER — Inpatient Hospital Stay (HOSPITAL_COMMUNITY): Payer: Self-pay | Admitting: Anesthesiology

## 2024-03-03 ENCOUNTER — Encounter (HOSPITAL_COMMUNITY)
Admission: EM | Disposition: A | Discharge status: Home / Self Care | Payer: Self-pay | Source: Ambulatory Visit | Attending: Internal Medicine

## 2024-03-03 ENCOUNTER — Other Ambulatory Visit: Payer: Self-pay | Admitting: Internal Medicine

## 2024-03-03 DIAGNOSIS — K8050 Calculus of bile duct without cholangitis or cholecystitis without obstruction: Secondary | ICD-10-CM | POA: Diagnosis not present

## 2024-03-03 DIAGNOSIS — G4733 Obstructive sleep apnea (adult) (pediatric): Secondary | ICD-10-CM | POA: Diagnosis not present

## 2024-03-03 DIAGNOSIS — I10 Essential (primary) hypertension: Secondary | ICD-10-CM

## 2024-03-03 DIAGNOSIS — I2510 Atherosclerotic heart disease of native coronary artery without angina pectoris: Secondary | ICD-10-CM | POA: Diagnosis not present

## 2024-03-03 DIAGNOSIS — R7881 Bacteremia: Secondary | ICD-10-CM | POA: Diagnosis not present

## 2024-03-03 DIAGNOSIS — K838 Other specified diseases of biliary tract: Secondary | ICD-10-CM

## 2024-03-03 DIAGNOSIS — K8309 Other cholangitis: Secondary | ICD-10-CM | POA: Diagnosis not present

## 2024-03-03 DIAGNOSIS — K851 Biliary acute pancreatitis without necrosis or infection: Secondary | ICD-10-CM

## 2024-03-03 HISTORY — PX: CHOLECYSTECTOMY: SHX55

## 2024-03-03 LAB — BASIC METABOLIC PANEL WITH GFR
Anion gap: 6 (ref 5–15)
BUN: 16 mg/dL (ref 8–23)
CO2: 25 mmol/L (ref 22–32)
Calcium: 8.2 mg/dL — ABNORMAL LOW (ref 8.9–10.3)
Chloride: 110 mmol/L (ref 98–111)
Creatinine, Ser: 1.12 mg/dL (ref 0.61–1.24)
GFR, Estimated: >60 mL/min (ref >60)
Glucose, Bld: 106 mg/dL — ABNORMAL HIGH (ref 70–99)
Potassium: 3.5 mmol/L (ref 3.5–5.1)
Sodium: 141 mmol/L (ref 135–145)

## 2024-03-03 LAB — CBC
HCT: 38.5 % — ABNORMAL LOW (ref 39.0–52.0)
Hemoglobin: 12.7 g/dL — ABNORMAL LOW (ref 13.0–17.0)
MCH: 29.8 pg (ref 26.0–34.0)
MCHC: 33 g/dL (ref 30.0–36.0)
MCV: 90.4 fL (ref 80.0–100.0)
Platelets: 109 10*3/uL — ABNORMAL LOW (ref 150–400)
RBC: 4.26 MIL/uL (ref 4.22–5.81)
RDW: 15.5 % (ref 11.5–15.5)
WBC: 12.3 10*3/uL — ABNORMAL HIGH (ref 4.0–10.5)
nRBC: 0 % (ref 0.0–0.2)

## 2024-03-03 LAB — SURGICAL PCR SCREEN
MRSA, PCR: NEGATIVE
Staphylococcus aureus: NEGATIVE

## 2024-03-03 LAB — CULTURE, BLOOD (ROUTINE X 2)
Special Requests: ADEQUATE
Special Requests: ADEQUATE

## 2024-03-03 LAB — GLUCOSE, CAPILLARY
Glucose-Capillary: 110 mg/dL — ABNORMAL HIGH (ref 70–99)
Glucose-Capillary: 140 mg/dL — ABNORMAL HIGH (ref 70–99)
Glucose-Capillary: 125 mg/dL — ABNORMAL HIGH (ref 70–99)
Glucose-Capillary: 187 mg/dL — ABNORMAL HIGH (ref 70–99)

## 2024-03-03 LAB — MAGNESIUM: Magnesium: 2 mg/dL (ref 1.7–2.4)

## 2024-03-03 SURGERY — LAPAROSCOPIC CHOLECYSTECTOMY
Anesthesia: General | Site: Abdomen

## 2024-03-03 MED ORDER — DEXAMETHASONE SODIUM PHOSPHATE 10 MG/ML IJ SOLN
INTRAMUSCULAR | Status: AC
  Filled 2024-03-03: qty 1

## 2024-03-03 MED ORDER — SUGAMMADEX SODIUM 200 MG/2ML IV SOLN
INTRAVENOUS | Status: DC | PRN
  Administered 2024-03-03: 200 mg via INTRAVENOUS

## 2024-03-03 MED ORDER — FENTANYL CITRATE (PF) 100 MCG/2ML IJ SOLN: 25.0000 ug | INTRAMUSCULAR | Status: DC | PRN

## 2024-03-03 MED ORDER — FENTANYL CITRATE (PF) 250 MCG/5ML IJ SOLN
INTRAMUSCULAR | Status: DC | PRN
  Administered 2024-03-03 (×3): 50 ug via INTRAVENOUS

## 2024-03-03 MED ORDER — ACETAMINOPHEN 500 MG PO TABS
1000.0000 mg | ORAL_TABLET | ORAL | Status: AC
  Administered 2024-03-03: 1000 mg via ORAL
  Filled 2024-03-03: qty 2

## 2024-03-03 MED ORDER — LACTATED RINGERS IV SOLN: INTRAVENOUS | Status: DC

## 2024-03-03 MED ORDER — BUPIVACAINE HCL 0.25 % IJ SOLN
INTRAMUSCULAR | Status: DC | PRN
  Administered 2024-03-03: 13 mL

## 2024-03-03 MED ORDER — LIDOCAINE 2% (20 MG/ML) 5 ML SYRINGE
INTRAMUSCULAR | Status: AC
Start: 2024-03-03 — End: ?
  Filled 2024-03-03: qty 5

## 2024-03-03 MED ORDER — OXYCODONE-ACETAMINOPHEN 5-325 MG PO TABS: 1.0000 | ORAL_TABLET | ORAL | Status: DC | PRN

## 2024-03-03 MED ORDER — ORAL CARE MOUTH RINSE: 15.0000 mL | Freq: Once | OROMUCOSAL | Status: AC

## 2024-03-03 MED ORDER — GABAPENTIN 300 MG PO CAPS
300.0000 mg | ORAL_CAPSULE | ORAL | Status: AC
  Administered 2024-03-03: 300 mg via ORAL
  Filled 2024-03-03: qty 1

## 2024-03-03 MED ORDER — SODIUM CHLORIDE 0.9 % IR SOLN
Status: DC | PRN
  Administered 2024-03-03: 1000 mL

## 2024-03-03 MED ORDER — FENTANYL CITRATE (PF) 250 MCG/5ML IJ SOLN
INTRAMUSCULAR | Status: AC
  Filled 2024-03-03: qty 5

## 2024-03-03 MED ORDER — MUPIROCIN 2 % EX OINT
1.0000 | TOPICAL_OINTMENT | Freq: Two times a day (BID) | CUTANEOUS | Status: DC
  Filled 2024-03-03: qty 22

## 2024-03-03 MED ORDER — PROPOFOL 10 MG/ML IV BOLUS
INTRAVENOUS | Status: DC | PRN
  Administered 2024-03-03: 100 mg via INTRAVENOUS

## 2024-03-03 MED ORDER — CHLORHEXIDINE GLUCONATE CLOTH 2 % EX PADS
6.0000 | MEDICATED_PAD | Freq: Once | CUTANEOUS | Status: AC
  Administered 2024-03-03: 6 via TOPICAL

## 2024-03-03 MED ORDER — ROCURONIUM BROMIDE 10 MG/ML (PF) SYRINGE
PREFILLED_SYRINGE | INTRAVENOUS | Status: DC | PRN
  Administered 2024-03-03: 60 mg via INTRAVENOUS

## 2024-03-03 MED ORDER — ROCURONIUM BROMIDE 10 MG/ML (PF) SYRINGE
PREFILLED_SYRINGE | INTRAVENOUS | Status: AC
  Filled 2024-03-03: qty 10

## 2024-03-03 MED ORDER — ACETAMINOPHEN 500 MG PO TABS: 1000.0000 mg | ORAL_TABLET | ORAL | Status: DC

## 2024-03-03 MED ORDER — ACETAMINOPHEN 10 MG/ML IV SOLN: 1000.0000 mg | Freq: Once | INTRAVENOUS | Status: DC | PRN

## 2024-03-03 MED ORDER — PHENYLEPHRINE HCL-NACL 20-0.9 MG/250ML-% IV SOLN
INTRAVENOUS | Status: DC | PRN
  Administered 2024-03-03 (×3): 80 ug via INTRAVENOUS

## 2024-03-03 MED ORDER — DEXMEDETOMIDINE HCL IN NACL 80 MCG/20ML IV SOLN
INTRAVENOUS | Status: DC | PRN
  Administered 2024-03-03: 8 ug via INTRAVENOUS

## 2024-03-03 MED ORDER — CHLORHEXIDINE GLUCONATE 0.12 % MT SOLN
OROMUCOSAL | Status: AC
  Administered 2024-03-03: 15 mL via OROMUCOSAL
  Filled 2024-03-03: qty 15

## 2024-03-03 MED ORDER — ONDANSETRON HCL 4 MG/2ML IJ SOLN
INTRAMUSCULAR | Status: DC | PRN
  Administered 2024-03-03: 4 mg via INTRAVENOUS

## 2024-03-03 MED ORDER — BUPIVACAINE HCL (PF) 0.25 % IJ SOLN
INTRAMUSCULAR | Status: AC
  Filled 2024-03-03: qty 30

## 2024-03-03 MED ORDER — CHLORHEXIDINE GLUCONATE CLOTH 2 % EX PADS: 6.0000 | MEDICATED_PAD | Freq: Once | CUTANEOUS | Status: DC

## 2024-03-03 MED ORDER — PROPOFOL 10 MG/ML IV BOLUS
INTRAVENOUS | Status: AC
  Filled 2024-03-03: qty 20

## 2024-03-03 MED ORDER — 0.9 % SODIUM CHLORIDE (POUR BTL) OPTIME
TOPICAL | Status: DC | PRN
  Administered 2024-03-03: 1000 mL

## 2024-03-03 MED ORDER — LIDOCAINE 2% (20 MG/ML) 5 ML SYRINGE
INTRAMUSCULAR | Status: DC | PRN
  Administered 2024-03-03: 100 mg via INTRAVENOUS

## 2024-03-03 MED ORDER — CHLORHEXIDINE GLUCONATE 0.12 % MT SOLN: 15.0000 mL | Freq: Once | OROMUCOSAL | Status: AC

## 2024-03-03 MED ORDER — ONDANSETRON HCL 4 MG/2ML IJ SOLN
INTRAMUSCULAR | Status: AC
  Filled 2024-03-03: qty 2

## 2024-03-03 MED ORDER — DEXAMETHASONE SODIUM PHOSPHATE 10 MG/ML IJ SOLN
INTRAMUSCULAR | Status: DC | PRN
  Administered 2024-03-03: 4 mg via INTRAVENOUS

## 2024-03-03 MED ORDER — ONDANSETRON HCL 4 MG/2ML IJ SOLN: 4.0000 mg | Freq: Once | INTRAMUSCULAR | Status: DC | PRN

## 2024-03-03 MED ORDER — INDOCYANINE GREEN 25 MG IV SOLR
2.5000 mg | Freq: Once | INTRAVENOUS | Status: AC
  Administered 2024-03-03: 2.5 mg via INTRAVENOUS
  Filled 2024-03-03: qty 10

## 2024-03-03 MED ORDER — SPY AGENT GREEN - (INDOCYANINE FOR INJECTION)
1.2500 mg | Freq: Once | INTRAMUSCULAR | Status: DC
  Filled 2024-03-03: qty 10

## 2024-03-03 SURGICAL SUPPLY — 36 items
BAG COUNTER SPONGE SURGICOUNT (BAG) ×2 IMPLANT
BLADE CLIPPER SURG (BLADE) IMPLANT
CANISTER SUCTION 3000ML PPV (SUCTIONS) ×2 IMPLANT
CHLORAPREP W/TINT 26 (MISCELLANEOUS) ×2 IMPLANT
CLIP LIGATING HEMO O LOK GREEN (MISCELLANEOUS) ×2 IMPLANT
COVER SURGICAL LIGHT HANDLE (MISCELLANEOUS) ×2 IMPLANT
COVER TRANSDUCER ULTRASND (DRAPES) ×2 IMPLANT
DERMABOND ADVANCED .7 DNX12 (GAUZE/BANDAGES/DRESSINGS) ×2 IMPLANT
ELECTRODE REM PT RTRN 9FT ADLT (ELECTROSURGICAL) ×2 IMPLANT
GLOVE BIO SURGEON STRL SZ7.5 (GLOVE) ×4 IMPLANT
GOWN STRL REUS W/ TWL LRG LVL3 (GOWN DISPOSABLE) ×4 IMPLANT
GOWN STRL REUS W/ TWL XL LVL3 (GOWN DISPOSABLE) ×2 IMPLANT
GRASPER SUT TROCAR 14GX15 (MISCELLANEOUS) ×2 IMPLANT
IRRIGATION SUCT STRKRFLW 2 WTP (MISCELLANEOUS) ×2 IMPLANT
KIT BASIN OR (CUSTOM PROCEDURE TRAY) ×2 IMPLANT
KIT IMAGING PINPOINTPAQ (MISCELLANEOUS) IMPLANT
KIT TURNOVER KIT B (KITS) ×2 IMPLANT
NDL INSUFFLATION 14GA 120MM (NEEDLE) ×2 IMPLANT
NEEDLE INSUFFLATION 14GA 120MM (NEEDLE) ×1 IMPLANT
NS IRRIG 1000ML POUR BTL (IV SOLUTION) ×2 IMPLANT
PAD ARMBOARD POSITIONER FOAM (MISCELLANEOUS) ×2 IMPLANT
POUCH LAPAROSCOPIC INSTRUMENT (MISCELLANEOUS) ×2 IMPLANT
POUCH RETRIEVAL ECOSAC 10 (ENDOMECHANICALS) IMPLANT
SCISSORS LAP 5X35 DISP (ENDOMECHANICALS) ×2 IMPLANT
SET TUBE SMOKE EVAC HIGH FLOW (TUBING) ×2 IMPLANT
SLEEVE Z-THREAD 5X100MM (TROCAR) ×2 IMPLANT
SPECIMEN JAR SMALL (MISCELLANEOUS) ×2 IMPLANT
SUT MNCRL AB 4-0 PS2 18 (SUTURE) ×2 IMPLANT
SUT VICRYL 0 UR6 27IN ABS (SUTURE) IMPLANT
TOWEL GREEN STERILE (TOWEL DISPOSABLE) ×2 IMPLANT
TOWEL GREEN STERILE FF (TOWEL DISPOSABLE) ×2 IMPLANT
TRAY LAPAROSCOPIC MC (CUSTOM PROCEDURE TRAY) ×2 IMPLANT
TROCAR 11X100 Z THREAD (TROCAR) ×2 IMPLANT
TROCAR Z-THREAD OPTICAL 5X100M (TROCAR) ×2 IMPLANT
WARMER LAPAROSCOPE (MISCELLANEOUS) ×2 IMPLANT
WATER STERILE IRR 1000ML POUR (IV SOLUTION) ×2 IMPLANT

## 2024-03-03 NOTE — Op Note (Signed)
 03/03/2024  10:23 AM  PATIENT:  Omar [name]  78 y.o. male  PRE-OPERATIVE DIAGNOSIS:  gallstone pancreatitis  POST-OPERATIVE DIAGNOSIS:  gallstone pancreatitis  PROCEDURE:  Procedure(s) with comments: LAPAROSCOPIC CHOLECYSTECTOMY (N/A) - with indocyanine green  SURGEON:  Surgeons and Role:    Shela Derby, MD - Primary  ASSISTANTS: Woodroe Hazel, RNFA   ANESTHESIA:   local and general  EBL:  minimal   BLOOD ADMINISTERED:none  DRAINS: none   LOCAL MEDICATIONS USED:  BUPIVICAINE   SPECIMEN:  Source of Specimen:  gallbladder  DISPOSITION OF SPECIMEN:  PATHOLOGY  COUNTS:  YES  TOURNIQUET:  * No tourniquets in log *  DICTATION: .Dragon Dictation  The patient was taken to the operating and placed in the supine position with bilateral SCDs in place.  The patient was prepped and draped in the usual sterile fashion. A time out was called and all facts were verified. A pneumoperitoneum was obtained via A Veress needle technique to a pressure of 14mm of mercury.  A 5mm trochar was then placed in the right upper quadrant under visualization, and there were no injuries to any abdominal organs. A 11 mm port was then placed in the umbilical region after infiltrating with local anesthesia under direct visualization. A second and third epigastric port and right lower quadrant port placement under direct visualization, respectively.    The gallbladder was identified and retracted, the peritoneum was then sharply dissected from the gallbladder and this dissection was carried down to Calot's triangle. The cystic duct was identified and stripped away circumferentially and seen going into the gallbladder 360, the critical angle was obtained.  2 clips were placed proximally one distally and the cystic duct transected. The cystic artery was identified and 2 clips placed proximally and one distally and transected.  We then proceeded to remove the gallbladder off the hepatic fossa with Bovie  cautery. A retrieval bag was then placed in the abdomen and gallbladder placed in the bag. The hepatic fossa was then reexamined and hemostasis was achieved with Bovie cautery and was excellent at the end of the case.   The subhepatic fossa and perihepatic fossa was then irrigated until the effluent was clear.  The gallbladder and bag were removed from the abdominal cavity. The 11 mm trocar fascia was reapproximated with the Endo Close #1 Vicryl x3.  The pneumoperitoneum was evacuated and all trochars removed under direct visulalization.  The skin was then closed with 4-0 Monocryl and the skin dressed with Dermabond.    The patient was awaken from general anesthesia and taken to the recovery room in stable condition.   PLAN OF CARE: Admit to inpatient   PATIENT DISPOSITION:  PACU - hemodynamically stable.   Delay start of Pharmacological VTE agent (>24hrs) due to surgical blood loss or risk of bleeding: yes

## 2024-03-03 NOTE — Anesthesia Postprocedure Evaluation (Signed)
 Anesthesia Post Note  Patient: MANLY NESTLE  Procedure(s) Performed: LAPAROSCOPIC CHOLECYSTECTOMY (Abdomen)     Patient location during evaluation: PACU Anesthesia Type: General Level of consciousness: awake and alert Pain management: pain level controlled Vital Signs Assessment: post-procedure vital signs reviewed and stable Respiratory status: spontaneous breathing, nonlabored ventilation, respiratory function stable and patient connected to nasal cannula oxygen Cardiovascular status: blood pressure returned to baseline and stable Postop Assessment: no apparent nausea or vomiting Anesthetic complications: no  No notable events documented.  Last Vitals:  Vitals:   03/03/24 1115 03/03/24 1200  BP:  138/81  Pulse: 71 85  Resp: 15 (!) 21  Temp: 36.7 C   SpO2: 94% 95%    Last Pain:  Vitals:   03/03/24 1200  TempSrc:   PainSc: 0-No pain                 Rosalita Combe

## 2024-03-03 NOTE — Interval H&P Note (Signed)
 History and Physical Interval Note:  03/03/2024 8:45 AM  Omar [name]  has presented today for surgery, with the diagnosis of gallstone pancreatitis.  The various methods of treatment have been discussed with the patient and family. After consideration of risks, benefits and other options for treatment, the patient has consented to  Procedure(s) with comments: LAPAROSCOPIC CHOLECYSTECTOMY (N/A) - with indocyanine green as a surgical intervention.  The patient's history has been reviewed, patient examined, no change in status, stable for surgery.  I have reviewed the patient's chart and labs.  Questions were answered to the patient's satisfaction.     Janyia Guion

## 2024-03-03 NOTE — Progress Notes (Signed)
 Progress Note   Patient: Omar [name] ZOX:[id number] DOB: June 13, 1946 DOA: 02/27/2024  DOS: the patient was seen and examined on 03/03/2024   Brief hospital course:  78 y/o male who has a PMH for DVT on Eliquis , DMT2, HTN, CAD s/p 1 coronary stent, Factor V Mutation, HLD, Obesity admitted with septic shock secondary to choledocholithiasis and pancreatitis with likely cholecystitis.  Initially PCCM, transition to TR[facility] 5/31 for pickup.   Assessment and Plan:   Septic shock with multiple species bacteremia - Initially requiring IV fluid boluses and vasopressor medications.  Vasopressors subsequently weaned off.  Blood pressure appears stable at this time.  Blood cultures noting Enterococcus faecalis, E. coli, Klebsiella.  Infectious disease following closely.  Zosyn  transition to Unasyn, plans for 2-week coverage starting 5/30.  1 week antibiotic coverage for Klebsiella/E. coli should be sufficient.  Likely transition to p.o. Augmentin on discharge.   Acute pancreatitis/transaminitis secondary to choledocholithiasis - MRCP noting common bile duct stones.  ERCP 5/30 for stone extraction sphincterotomy.  Recommendations for antibiotics x 7 days.  Monitor for pancreatitis.  GI signed off.  S/p cholecystectomy 6/2.  Leukocytosis improving.  Advance diet per general surgery.   Likely acute cholecystitis - Evaluated by general surgery.  S/p laparoscopic cholecystectomy.   Acute kidney injury - Creatinine continues to improve.  IV fluids on board.  Recheck BMP in AM.  Factor V Leiden - History of Eliquis  use.  Awaiting resumption of Eliquis  per general surgery.   Subjective: Patient resting comfortably this afternoon, having just returned from laparoscopic cholecystectomy earlier this morning.  Denies any shortness of breath, fever, chills, nausea, vomiting.  Had some abdominal pain when moving and coughing but otherwise well-controlled.  Family at bedside.  Physical Exam:  Vitals:   03/03/24  1045 03/03/24 1100 03/03/24 1115 03/03/24 1200  BP: 119/66 132/76  138/81  Pulse: 87 75 71 85  Resp: 16 15 15  (!) 21  Temp: 98 F (36.7 C)  98 F (36.7 C)   TempSrc:      SpO2: 93% 94% 94% 95%  Weight:      Height:        GENERAL:  Alert, pleasant, no acute distress  HEENT:  EOMI CARDIOVASCULAR:  RRR, no murmurs appreciated RESPIRATORY:  Clear to auscultation, no wheezing, rales, or rhonchi GASTROINTESTINAL:  Soft, minimally tender, fresh surgical members EXTREMITIES:  No LE edema bilaterally NEURO:  No new focal deficits appreciated SKIN:  No rashes noted PSYCH:  Appropriate mood and affect    Data Reviewed:  No new imaging to review at this time  Previous records (including but not limited to H&P, progress notes, nursing notes, TOC management) were reviewed in assessment of this patient.  Labs: CBC: Recent Labs  Lab 02/27/24 2054 02/28/24 0523 02/29/24 0312 03/01/24 0253 03/02/24 0350 03/03/24 0400  WBC 19.3* 26.3* 29.7* 23.7* 16.7* 12.3*  NEUTROABS 18.5*  --   --   --   --   --   HGB 17.0 13.7 12.8* 12.6* 13.3 12.7*  HCT 51.4 40.8 38.1* 37.6* 39.6 38.5*  MCV 91.0 90.5 89.6 90.4 90.8 90.4  PLT 318 195 129* 119* 113* 109*   Basic Metabolic Panel: Recent Labs  Lab 02/28/24 0230 02/28/24 0523 02/29/24 0312 03/01/24 0253 03/02/24 0350 03/03/24 0400  NA  --  140 135 137 140 141  K  --  3.4* 3.9 3.6 4.0 3.5  CL  --  107 104 106 110 110  CO2  --  19* 20* 23  24 25  GLUCOSE  --  178* 150* 95 127* 106*  BUN  --  24* 28* 34* 25* 16  CREATININE  --  1.75* 1.48* 1.47* 1.26* 1.12  CALCIUM   --  8.6* 8.2* 8.2* 8.5* 8.2*  MG 1.4*  --   --   --  2.2 2.0   Liver Function Tests: Recent Labs  Lab 02/27/24 2240 02/28/24 0523 02/29/24 0312 03/01/24 0253 03/02/24 0350  AST 329* 257* 126* 66* 41  ALT 262* 249* 189* 131* 95*  ALKPHOS 141* 115 97 87 102  BILITOT 3.0* 3.1* 1.7* 1.0 0.8  PROT 4.8* 4.6* 4.8* 4.9* 5.0*  ALBUMIN 2.5* 2.4* 2.3* 2.2* 2.2*    CBG: Recent Labs  Lab 03/02/24 1509 03/02/24 2149 03/03/24 0752 03/03/24 1053 03/03/24 1134  GLUCAP 230* 145* 110* 140* 125*    Scheduled Meds:  Chlorhexidine  Gluconate Cloth  6 each Topical Daily   insulin  aspart  0-15 Units Subcutaneous TID WC   pantoprazole  (PROTONIX ) IV  40 mg Intravenous Q24H   sodium chloride  flush  10-40 mL Intracatheter Q12H   tamsulosin   0.4 mg Oral QPC supper   Continuous Infusions:  ampicillin-sulbactam (UNASYN) IV Stopped (03/03/24 0405)   PRN Meds:.docusate sodium , gadobutrol , loperamide, melatonin, mouth rinse, oxyCODONE -acetaminophen , polyethylene glycol, sodium chloride  flush  Family Communication: Family at bedside  Disposition: Status is: Inpatient Remains inpatient appropriate because: Sepsis, choledocholithiasis, cholecystitis     Time spent: 34 minutes  Length of inpatient stay: 4 days  Author: Jodeane Mulligan, DO 03/03/2024 2:35 PM  For on call review [email or web address].

## 2024-03-03 NOTE — Anesthesia Procedure Notes (Addendum)
 Procedure Name: Intubation Date/Time: 03/03/2024 9:40 AM  Performed by: Viki Graver, CRNAPre-anesthesia Checklist: Patient identified, Emergency Drugs available, Suction available, Patient being monitored and Timeout performed Patient Re-evaluated:Patient Re-evaluated prior to induction Oxygen Delivery Method: Circle system utilized Preoxygenation: Pre-oxygenation with 100% oxygen Induction Type: IV induction Ventilation: Oral airway inserted - appropriate to patient size and Mask ventilation without difficulty Laryngoscope Size: Mac and 4 Grade View: Grade I Tube type: Oral Tube size: 7.5 mm Number of attempts: 1 Airway Equipment and Method: Stylet Placement Confirmation: ETT inserted through vocal cords under direct vision, positive ETCO2, CO2 detector and breath sounds checked- equal and bilateral Secured at: 22 cm Tube secured with: Tape Dental Injury: Teeth and Oropharynx as per pre-operative assessment

## 2024-03-03 NOTE — Transfer of Care (Signed)
 Immediate Anesthesia Transfer of Care Note  Patient: Omar [name]  Procedure(s) Performed: LAPAROSCOPIC CHOLECYSTECTOMY (Abdomen)  Patient Location: PACU  Anesthesia Type:General  Level of Consciousness: drowsy  Airway & Oxygen Therapy: Patient Spontanous Breathing  Post-op Assessment: Report given to RN and Post -op Vital signs reviewed and stable  Post vital signs: Reviewed and stable  Last Vitals:  Vitals Value Taken Time  BP 114/69 03/03/24 1039  Temp    Pulse 83 03/03/24 1042  Resp 17 03/03/24 1042  SpO2 94 % 03/03/24 1042  Vitals shown include unfiled device data.  Last Pain:  Vitals:   03/03/24 0829  TempSrc:   PainSc: 0-No pain         Complications: No notable events documented.

## 2024-03-03 NOTE — Discharge Instructions (Signed)

## 2024-03-04 ENCOUNTER — Encounter (HOSPITAL_COMMUNITY): Payer: Self-pay | Admitting: General Surgery

## 2024-03-04 ENCOUNTER — Other Ambulatory Visit (HOSPITAL_COMMUNITY): Payer: Self-pay

## 2024-03-04 LAB — CBC
HCT: 37.1 % — ABNORMAL LOW (ref 39.0–52.0)
Hemoglobin: 12.4 g/dL — ABNORMAL LOW (ref 13.0–17.0)
MCH: 30.1 pg (ref 26.0–34.0)
MCHC: 33.4 g/dL (ref 30.0–36.0)
MCV: 90 fL (ref 80.0–100.0)
Platelets: 134 10*3/uL — ABNORMAL LOW (ref 150–400)
RBC: 4.12 MIL/uL — ABNORMAL LOW (ref 4.22–5.81)
RDW: 15.4 % (ref 11.5–15.5)
WBC: 11.6 10*3/uL — ABNORMAL HIGH (ref 4.0–10.5)
nRBC: 0 % (ref 0.0–0.2)

## 2024-03-04 LAB — GLUCOSE, CAPILLARY
Glucose-Capillary: 157 mg/dL — ABNORMAL HIGH (ref 70–99)
Glucose-Capillary: 166 mg/dL — ABNORMAL HIGH (ref 70–99)
Glucose-Capillary: 240 mg/dL — ABNORMAL HIGH (ref 70–99)

## 2024-03-04 LAB — COMPREHENSIVE METABOLIC PANEL WITH GFR
ALT: 67 U/L — ABNORMAL HIGH (ref 0–44)
AST: 39 U/L (ref 15–41)
Albumin: 2.2 g/dL — ABNORMAL LOW (ref 3.5–5.0)
Alkaline Phosphatase: 99 U/L (ref 38–126)
Anion gap: 9 (ref 5–15)
BUN: 17 mg/dL (ref 8–23)
CO2: 24 mmol/L (ref 22–32)
Calcium: 8.6 mg/dL — ABNORMAL LOW (ref 8.9–10.3)
Chloride: 107 mmol/L (ref 98–111)
Creatinine, Ser: 1.11 mg/dL (ref 0.61–1.24)
GFR, Estimated: >60 mL/min (ref >60)
Glucose, Bld: 171 mg/dL — ABNORMAL HIGH (ref 70–99)
Potassium: 3.8 mmol/L (ref 3.5–5.1)
Sodium: 140 mmol/L (ref 135–145)
Total Bilirubin: 0.6 mg/dL (ref 0.0–1.2)
Total Protein: 4.7 g/dL — ABNORMAL LOW (ref 6.5–8.1)

## 2024-03-04 LAB — SURGICAL PATHOLOGY

## 2024-03-04 LAB — MAGNESIUM: Magnesium: 1.8 mg/dL (ref 1.7–2.4)

## 2024-03-04 MED ORDER — AMOXICILLIN-POT CLAVULANATE 875-125 MG PO TABS
1.0000 | ORAL_TABLET | Freq: Two times a day (BID) | ORAL | 0 refills | Status: AC
  Filled 2024-03-04: qty 22, 11d supply, fill #0

## 2024-03-04 MED ORDER — PANTOPRAZOLE SODIUM 40 MG PO TBEC: 40.0000 mg | DELAYED_RELEASE_TABLET | Freq: Every day | ORAL | Status: DC

## 2024-03-04 MED ORDER — AMOXICILLIN-POT CLAVULANATE 875-125 MG PO TABS
1.0000 | ORAL_TABLET | Freq: Two times a day (BID) | ORAL | Status: DC
  Administered 2024-03-04: 1 via ORAL
  Filled 2024-03-04 (×2): qty 1

## 2024-03-04 MED ORDER — APIXABAN 5 MG PO TABS
5.0000 mg | ORAL_TABLET | Freq: Two times a day (BID) | ORAL | Status: DC
  Administered 2024-03-04: 5 mg via ORAL
  Filled 2024-03-04: qty 1

## 2024-03-04 NOTE — Discharge Summary (Signed)
 Physician Discharge Summary   Patient: Omar [name] MRN: [id number] DOB: 04-29-1946  Admit date:     02/27/2024  Discharge date: 03/04/24  Discharge Physician: Jodeane Mulligan   PCP: Alison Irvine, FNP   Recommendations at discharge:    Pt to be discharged home.   If you experience worsening fever, chills, chest pain, shortness of breath, or other concerning symptoms, please call your PCP or go to the emergency department immediately.  Discharge Diagnoses: Principal Problem:   Septic shock (HCC) Active Problems:   Bacteremia due to Enterococcus   Bacteremia due to Klebsiella pneumoniae   Bacteremia   RUQ pain   Elevated LFTs   Gallstones   Acute biliary pancreatitis   Cholangitis   Choledocholithiasis  Resolved Problems:   * No resolved hospital problems. *   Hospital Course:  78 y/o male who has a PMH for DVT on Eliquis , DMT2, HTN, CAD s/p 1 coronary stent, Factor V Mutation, HLD, Obesity admitted with septic shock secondary to choledocholithiasis and pancreatitis with likely cholecystitis.  Initially PCCM, transition to TRH 5/31 for pickup.   Assessment and Plan:   Septic shock with multiple species bacteremia - Initially requiring IV fluid boluses and vasopressor medications.  Vasopressors subsequently weaned off.  Blood pressure appears stable at this time.  Blood cultures noting Enterococcus faecalis, E. coli, Klebsiella.  Infectious disease following closely.  Zosyn  transition to Unasyn, plans for 2-week coverage starting 5/30.  1 week antibiotic coverage for Klebsiella/E. coli should be sufficient.  Will transition to p.o. Augmentin on discharge to take as directed.  Sepsis and shock resolved.   Acute pancreatitis/transaminitis secondary to choledocholithiasis - MRCP noting common bile duct stones.  ERCP 5/30 for stone extraction sphincterotomy.  Recommendations for antibiotics x 7 days.  Monitor for pancreatitis.  GI signed off.  S/p cholecystectomy 6/2.   Leukocytosis improving, nearly resolved.  Tolerated management 9.  Will give referral for outpatient GI follow-up.  Likely acute cholecystitis - Evaluated by general surgery.  S/p laparoscopic cholecystectomy.  Can resume Eliquis  at this time.  Follow-up with general surgery in the outpatient setting.   Acute kidney injury - Resolved after IV fluid hydration.   Factor V Leiden - History of Eliquis  use.  Resume Eliquis  per general surgery.   Consultants: Gastroenterology, general surgery, critical care/PCCM Procedures performed: ERCP, laparoscopic cholecystectomy Disposition: Home Diet recommendation:  Discharge Diet Orders (From admission, onward)     Start     Ordered   03/04/24 0000  Diet - low sodium heart healthy        03/04/24 1148           Cardiac diet  DISCHARGE MEDICATION: Allergies as of 03/04/2024       Reactions   Empagliflozin          Medication List     TAKE these medications    acebutolol  200 MG capsule Commonly known as: SECTRAL  Take 1 capsule (200 mg total) by mouth 2 (two) times daily.   acetaminophen  500 MG tablet Commonly known as: TYLENOL  Take 1,000 mg by mouth every 6 (six) hours as needed.   amoxicillin-clavulanate 875-125 MG tablet Commonly known as: AUGMENTIN Take 1 tablet by mouth every 12 (twelve) hours for 11 days.   apixaban  5 MG Tabs tablet Commonly known as: ELIQUIS  Take 5 mg by mouth 2 (two) times daily.   HYDROcodone-acetaminophen  5-325 MG tablet Commonly known as: NORCO/VICODIN Take by mouth.   lisinopril  5 MG tablet Commonly known as: ZESTRIL  TAKE (  1) TABLET BY MOUTH ONCE DAILY.   metFORMIN  500 MG tablet Commonly known as: GLUCOPHAGE  Take 1 tablet (500 mg total) by mouth 2 (two) times daily with a meal.   nitroGLYCERIN  0.4 MG SL tablet Commonly known as: NITROSTAT  PLACE 1 TAB UNDER TONGUE EVERY 5 MIN IF NEEDED FOR CHEST PAIN. MAY USE 3 TIMES.NO RELIEF CALL 911.   ONE-A-DAY 50 PLUS PO Take 1 tablet by mouth  at bedtime.   rosuvastatin  40 MG tablet Commonly known as: CRESTOR  Take 1 tablet (40 mg total) by mouth daily. What changed: when to take this   Semaglutide(0.25 or 0.5MG /DOS) 2 MG/3ML Sopn Inject 2 mg into the skin once a week. Inject 2mg  into skin on Sunday   spironolactone  50 MG tablet Commonly known as: ALDACTONE  TAKE 1 TABLET BY MOUTH EVERY MORNING   tamsulosin  0.4 MG Caps capsule Commonly known as: FLOMAX  Take 0.4 mg by mouth.   torsemide  20 MG tablet Commonly known as: DEMADEX  Take 20 mg by mouth daily as needed (swelling).   verapamil  240 MG CR tablet Commonly known as: CALAN -SR TAKE ONE TABLET BY MOUTH ONCE DAILY AT BEDTIME        Follow-up Information     Maczis, Puja Gosai, PA-C. Go on 03/31/2024.   Specialty: General Surgery Why: 9:45 AM, please arrive 30 min prior to appointment time Contact information: [address] [facility] [place] [address] [phone]                 Discharge Exam: Filed Weights   03/02/24 0231 03/03/24 0343 03/03/24 0808  Weight: 100.8 kg 108.7 kg 98.4 kg    GENERAL:  Alert, pleasant, no acute distress  HEENT:  EOMI CARDIOVASCULAR:  RRR, no murmurs appreciated RESPIRATORY:  Clear to auscultation, no wheezing, rales, or rhonchi GASTROINTESTINAL:  Soft, minimally tender, fresh surgical bandages EXTREMITIES:  No LE edema bilaterally NEURO:  No new focal deficits appreciated SKIN:  No rashes noted PSYCH:  Appropriate mood and affect    Condition at discharge: improving  The results of significant diagnostics from this hospitalization (including imaging, microbiology, ancillary and laboratory) are listed below for reference.   Imaging Studies: DG ERCP Result Date: 03/03/2024 CLINICAL DATA:  Ascending cholangitis and biliary pancreatitis. MRCP demonstrated choledocholithiasis EXAM: ERCP TECHNIQUE: Multiple spot images obtained with the fluoroscopic device and submitted for  interpretation post-procedure. FLUOROSCOPY: Radiation Exposure Index (as provided by the fluoroscopic device): 8.46 mGy Kerma COMPARISON:  None Available. FINDINGS: Eleven fluoroscopic views of the right upper quadrant were submitted branch of residual interpretation. Images demonstrate flexible endoscope with guidewires in the common bile duct. Retrograde cholangiogram demonstrates multiple filling defects consistent with choledocholithiasis. Balloon sweep of the common bile duct and sphincteroplasty. Mild dilatation of common bile duct. IMPRESSION: ERCP with removal of ductal stones. These images were submitted for radiologic interpretation only. Please see the procedural report for the amount of contrast and the fluoroscopy time utilized. Electronically Signed   By: Susan Ensign   On: 03/03/2024 12:33   MR 3D Recon At Scanner Result Date: 03/03/2024 : CLINICAL DATA: Right upper quadrant abdominal pain elevated LFTs, rule out choledocholithiasis EXAM: MRI ABDOMEN WITHOUT AND WITH CONTRAST (INCLUDING MRCP) TECHNIQUE: Multiplanar multisequence MR imaging of the abdomen was performed both before and after the administration of intravenous contrast. Heavily T2-weighted images of the biliary and pancreatic ducts were obtained, and three-dimensional MRCP images were rendered by post processing. CONTRAST: 10mL GADAVIST  GADOBUTROL  1 MMOL/ML IV SOLN COMPARISON: Right  upper quadrant ultrasound, 02/28/2024 FINDINGS: Lower chest: Cardiomegaly. Small bilateral pleural effusions. Hepatobiliary: No solid liver abnormality is seen. Mildly distended gallbladder. Mild gallbladder wall thickening. Multiple gallstones. Mild intrahepatic biliary ductal dilatation, common bile duct at the upper limit of normal in caliber measuring 0.7 cm, with two adjacent gallstones near the ampulla measuring 0.6 cm (series 3, image 28). Pancreas: Unremarkable. No pancreatic ductal dilatation or surrounding inflammatory changes. Spleen: Normal  in size without significant abnormality. Adrenals/Urinary Tract: Definitively benign macroscopic fat containing left adrenal adenoma, for which no further follow-up or characterization is required. Kidneys are normal, without renal calculi, solid lesion, or hydronephrosis. Stomach/Bowel: Stomach is within normal limits. Descending duodenal diverticulum. No evidence of bowel wall thickening, distention, or inflammatory changes. Vascular/Lymphatic: No significant vascular findings are present. No enlarged abdominal lymph nodes. Other: No abdominal wall hernia. Anasarca. Trace ascites. Musculoskeletal: No acute or significant osseous findings. IMPRESSION: 1. Choledocholithiasis, two adjacent gallstones within the common bile duct near the ampulla measuring 0.6 cm. 2. Mildly distended gallbladder with multiple gallstones and mild gallbladder wall thickening. Findings are concerning for acute cholecystitis. 3. Cardiomegaly. Anasarca and small bilateral pleural effusions. 4. Trace ascites. These results will be called to the ordering clinician or representative by the Radiologist Assistant, and communication documented in the PACS or [employer]. Electronically Signed By: Fredricka Jenny M.D. On: 02/28/2024 20:25 Electronically Signed   By: Fredricka Jenny M.D.   On: 03/03/2024 12:16   MR ABDOMEN MRCP W WO CONTAST Result Date: 02/28/2024 CLINICAL DATA:  Right upper quadrant abdominal pain elevated LFTs, rule out choledocholithiasis EXAM: MRI ABDOMEN WITHOUT AND WITH CONTRAST (INCLUDING MRCP) TECHNIQUE: Multiplanar multisequence MR imaging of the abdomen was performed both before and after the administration of intravenous contrast. Heavily T2-weighted images of the biliary and pancreatic ducts were obtained, and three-dimensional MRCP images were rendered by post processing. CONTRAST:  10mL GADAVIST  GADOBUTROL  1 MMOL/ML IV SOLN COMPARISON:  Right upper quadrant ultrasound, 02/28/2024 FINDINGS: Lower chest:  Cardiomegaly.  Small bilateral pleural effusions. Hepatobiliary: No solid liver abnormality is seen. Mildly distended gallbladder. Mild gallbladder wall thickening. Multiple gallstones. Mild intrahepatic biliary ductal dilatation, common bile duct at the upper limit of normal in caliber measuring 0.7 cm, with two adjacent gallstones near the ampulla measuring 0.6 cm (series 3, image 28). Pancreas: Unremarkable. No pancreatic ductal dilatation or surrounding inflammatory changes. Spleen: Normal in size without significant abnormality. Adrenals/Urinary Tract: Definitively benign macroscopic fat containing left adrenal adenoma, for which no further follow-up or characterization is required. Kidneys are normal, without renal calculi, solid lesion, or hydronephrosis. Stomach/Bowel: Stomach is within normal limits. Descending duodenal diverticulum. No evidence of bowel wall thickening, distention, or inflammatory changes. Vascular/Lymphatic: No significant vascular findings are present. No enlarged abdominal lymph nodes. Other: No abdominal wall hernia.  Anasarca.  Trace ascites. Musculoskeletal: No acute or significant osseous findings. IMPRESSION: 1. Choledocholithiasis, two adjacent gallstones within the common bile duct near the ampulla measuring 0.6 cm. 2. Mildly distended gallbladder with multiple gallstones and mild gallbladder wall thickening. Findings are concerning for acute cholecystitis. 3. Cardiomegaly.  Anasarca and small bilateral pleural effusions. 4. Trace ascites. These results will be called to the ordering clinician or representative by the Radiologist Assistant, and communication documented in the PACS or [employer]. Electronically Signed   By: Fredricka Jenny M.D.   On: 02/28/2024 20:25   ECHOCARDIOGRAM COMPLETE BUBBLE STUDY Result Date: 02/28/2024    ECHOCARDIOGRAM REPORT   Patient Name:   Ardell Beauvais Date of Exam:  02/28/2024 Medical Rec #:  [id number]       Height:       68.0 in Accession  #:    [id number]      Weight:       215.0 lb Date of Birth:  [birth year]        BSA:          2.108 m Patient Age:    77 years        BP:           155/95 mmHg Patient Gender: M               HR:           87 bpm. Exam Location:  Inpatient Procedure: 2D Echo, Cardiac Doppler and Color Doppler (Both Spectral and Color            Flow Doppler were utilized during procedure). Indications:    Stroke I 63.9  History:        Patient has prior history of Echocardiogram examinations. Risk                 Factors:Hypertension.  Sonographer:    Jeralene Mom Referring Phys: [id number] GREGORY D CALONE IMPRESSIONS  1. Left ventricular ejection fraction, by estimation, is 50 to 55%. The left ventricle has low normal function. The left ventricle has no regional wall motion abnormalities. Left ventricular diastolic parameters are consistent with Grade I diastolic dysfunction (impaired relaxation).  2. Right ventricular systolic function is normal. The right ventricular size is mildly enlarged.  3. Left atrial size was mildly dilated.  4. The mitral valve is normal in structure. Trivial mitral valve regurgitation. No evidence of mitral stenosis.  5. The aortic valve is tricuspid. Aortic valve regurgitation is trivial. Aortic valve sclerosis/calcification is present, without any evidence of aortic stenosis. Aortic valve area, by VTI measures 1.92 cm. Aortic valve mean gradient measures 4.0 mmHg.  Aortic valve Vmax measures 1.41 m/s.  6. The inferior vena cava is normal in size with greater than 50% respiratory variability, suggesting right atrial pressure of 3 mmHg.  7. Agitated saline contrast bubble study was negative, with no evidence of any interatrial shunt. Conclusion(s)/Recommendation(s): No intracardiac source of embolism detected on this transthoracic study. Consider a transesophageal echocardiogram to exclude cardiac source of embolism if clinically indicated. FINDINGS  Left Ventricle: Left ventricular ejection fraction,  by estimation, is 50 to 55%. The left ventricle has low normal function. The left ventricle has no regional wall motion abnormalities. The left ventricular internal cavity size was normal in size. There is no left ventricular hypertrophy. Left ventricular diastolic parameters are consistent with Grade I diastolic dysfunction (impaired relaxation). Normal left ventricular filling pressure. Right Ventricle: The right ventricular size is mildly enlarged. No increase in right ventricular wall thickness. Right ventricular systolic function is normal. Left Atrium: Left atrial size was mildly dilated. Right Atrium: Right atrial size was normal in size. Pericardium: There is no evidence of pericardial effusion. Mitral Valve: The mitral valve is normal in structure. Trivial mitral valve regurgitation. No evidence of mitral valve stenosis. Tricuspid Valve: The tricuspid valve is normal in structure. Tricuspid valve regurgitation is not demonstrated. No evidence of tricuspid stenosis. Aortic Valve: The aortic valve is tricuspid. Aortic valve regurgitation is trivial. Aortic valve sclerosis/calcification is present, without any evidence of aortic stenosis. Aortic valve mean gradient measures 4.0 mmHg. Aortic valve peak gradient measures 8.0 mmHg. Aortic valve area, by VTI measures 1.92 cm. Pulmonic Valve: The  pulmonic valve was normal in structure. Pulmonic valve regurgitation is not visualized. No evidence of pulmonic stenosis. Aorta: The aortic root is normal in size and structure. Venous: The inferior vena cava is normal in size with greater than 50% respiratory variability, suggesting right atrial pressure of 3 mmHg. IAS/Shunts: No atrial level shunt detected by color flow Doppler. Agitated saline contrast was given intravenously to evaluate for intracardiac shunting. Agitated saline contrast bubble study was negative, with no evidence of any interatrial shunt.  LEFT VENTRICLE PLAX 2D LVIDd:         4.10 cm   Diastology  LVIDs:         3.00 cm   LV e' medial:    3.48 cm/s LV PW:         1.10 cm   LV E/e' medial:  12.2 LV IVS:        1.10 cm   LV e' lateral:   5.00 cm/s LVOT diam:     2.20 cm   LV E/e' lateral: 8.5 LV SV:         53 LV SV Index:   25 LVOT Area:     3.80 cm  RIGHT VENTRICLE RV Basal diam:  4.00 cm RV Mid diam:    3.90 cm RV S prime:     8.59 cm/s TAPSE (M-mode): 1.2 cm LEFT ATRIUM             Index        RIGHT ATRIUM           Index LA diam:        3.90 cm 1.85 cm/m   RA Area:     13.20 cm LA Vol (A2C):   87.3 ml 41.42 ml/m  RA Volume:   22.00 ml  10.44 ml/m LA Vol (A4C):   70.5 ml 33.45 ml/m LA Biplane Vol: 77.9 ml 36.96 ml/m  AORTIC VALVE AV Area (Vmax):    2.06 cm AV Area (Vmean):   1.92 cm AV Area (VTI):     1.92 cm AV Vmax:           141.00 cm/s AV Vmean:          95.050 cm/s AV VTI:            0.276 m AV Peak Grad:      8.0 mmHg AV Mean Grad:      4.0 mmHg LVOT Vmax:         76.50 cm/s LVOT Vmean:        48.100 cm/s LVOT VTI:          0.140 m LVOT/AV VTI ratio: 0.51  AORTA Ao Root diam: 3.00 cm Ao Asc diam:  3.00 cm MITRAL VALVE               TRICUSPID VALVE MV Area (PHT): 3.66 cm    TR Peak grad:   12.8 mmHg MV Decel Time: 207 msec    TR Vmax:        179.00 cm/s MR Peak grad: 44.6 mmHg MR Vmax:      334.00 cm/s  SHUNTS MV E velocity: 42.60 cm/s  Systemic VTI:  0.14 m MV A velocity: 59.50 cm/s  Systemic Diam: 2.20 cm MV E/A ratio:  0.72 Gaylyn Keas MD Electronically signed by Gaylyn Keas MD Signature Date/Time: 02/28/2024/3:16:01 PM    Final    DG CHEST PORT 1 VIEW Result Date: 02/28/2024 CLINICAL DATA:  [id number].  Encounter for central line placement.  EXAM: PORTABLE CHEST 1 VIEW COMPARISON:  Portable chest 05/19/2022, CTA chest yesterday at 9:31 p.m. FINDINGS: 4:49 a.m. left IJ central line terminates about the superior cavoatrial junction with no visible pneumothorax. The heart is slightly enlarged. The mediastinum is stable with aortic atherosclerosis. Central vascular prominence is present  without edema. The lungs are hypoinflated but generally clear. No new osseous findings.  Slight thoracic levoscoliosis. IMPRESSION: 1. Left IJ central line terminates about the superior cavoatrial junction with no visible pneumothorax. 2. Hypoinflation with central vascular prominence but no edema. 3. Aortic atherosclerosis. Electronically Signed   By: Denman Fischer M.D.   On: 02/28/2024 05:31   US  Abdomen Limited RUQ (LIVER/GB) Result Date: 02/28/2024 CLINICAL DATA:  Epigastric pain. EXAM: ULTRASOUND ABDOMEN LIMITED RIGHT UPPER QUADRANT COMPARISON:  None Available. FINDINGS: Gallbladder: Shadowing, echogenic gallstones are seen within the gallbladder lumen. The largest measures approximately 2.2 cm. The gallbladder wall measures 3.8 mm in thickness. No sonographic Murphy sign noted by sonographer. Common bile duct: Diameter: 5.7 mm Liver: No focal lesion identified. Diffusely decreased echogenicity of the liver parenchyma is noted. Portal vein is patent on color Doppler imaging with normal direction of blood flow towards the liver. Other: Technically limited study secondary to limited patient positioning, as per the ultrasound technologist. IMPRESSION: 1. Cholelithiasis without evidence of acute cholecystitis. Electronically Signed   By: Virgle Grime M.D.   On: 02/28/2024 00:35   CT Angio Chest/Abd/Pel for Dissection W and/or W/WO Result Date: 02/27/2024 CLINICAL DATA:  Aortic aneurysm suspected Abdominal pain and distension.  Diaphoretic. EXAM: CT ANGIOGRAPHY CHEST, ABDOMEN AND PELVIS TECHNIQUE: Non-contrast CT of the chest was initially obtained. Multidetector CT imaging through the chest, abdomen and pelvis was performed using the standard protocol during bolus administration of intravenous contrast. Multiplanar reconstructed images and MIPs were obtained and reviewed to evaluate the vascular anatomy. RADIATION DOSE REDUCTION: This exam was performed according to the departmental dose-optimization  program which includes automated exposure control, adjustment of the mA and/or kV according to patient size and/or use of iterative reconstruction technique. CONTRAST:  OMNIPAQUE  IOHEXOL  350 MG/ML SOLN COMPARISON:  Noncontrast abdominopelvic CT 08/15/2019 FINDINGS: CTA CHEST FINDINGS Cardiovascular: No aortic hematoma on unenhanced exam. Diffuse aortic atherosclerosis and tortuosity without acute aortic findings. No aortic dissection. 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 There is no central pulmonary embolus to the proximal segmental level. The main pulmonary artery is dilated at 3.8 cm. No pericardial effusion. Coronary artery calcifications. Mediastinum/Nodes: No mediastinal or hilar adenopathy. Unremarkable appearance of the esophagus. No visible thyroid  nodule. Lungs/Pleura: Subsegmental atelectasis in the dependent lower lobes. No confluent airspace disease. No pleural fluid. No features of pulmonary edema. Calcified granuloma in the right lower lobe, series 8, image 77, benign needing no further imaging follow-up. Musculoskeletal: Thoracic spondylosis. There are no acute or suspicious osseous abnormalities. Mild bilateral gynecomastia. Review of the MIP images confirms the above findings. CTA ABDOMEN AND PELVIS FINDINGS VASCULAR Aorta: Normal caliber aorta without aneurysm, dissection, vasculitis or significant stenosis. Moderate atherosclerosis. Celiac: Patent without evidence of aneurysm, dissection, vasculitis or significant stenosis. SMA: Patent without evidence of aneurysm, dissection, vasculitis or significant stenosis. Renals: 2 right and single left renal arteries are all patent. Renal arteries are patent without evidence of aneurysm, dissection, vasculitis, fibromuscular dysplasia or significant stenosis. IMA: Patent without evidence of aneurysm, dissection, vasculitis or significant stenosis. Inflow: Patent without evidence of aneurysm, dissection, vasculitis or  significant stenosis. Moderate atherosclerosis and tortuosity. Veins: No obvious venous abnormality within the  limitations of this arterial phase study. Review of the MIP images confirms the above findings. NON-VASCULAR Hepatobiliary: No focal liver abnormality. There are multiple intraluminal gallstones. Question mild gallbladder enhancement but no definite pericholecystic inflammation. Common bile duct is not well-defined on the current exam. Pancreas: Mild edema about the pancreatic head and uncinate process. No ductal dilatation. Spleen: Normal in size without focal abnormality. Adrenals/Urinary Tract: Low-density 2.6 cm left adrenal nodule is unchanged from prior exam and consistent with adenoma. The right adrenal gland is normal. Bilateral renal parenchymal thinning. No hydronephrosis. Nonobstructing 5 mm right renal stone. Partially distended urinary bladder. Stomach/Bowel: The stomach is moderately distended with fluid, tiny hiatal hernia. There is wall thickening in the pre pyloric stomach, series 7, image 131 with question of faint adjacent fat stranding. Fat stranding and wall thickening about the third and fourth portion of the duodenum, for example series 5, image 57. Chronic small duodenal diverticulum. Mild small bowel wall thickening involving bowel loops in the left abdomen. No obstruction. Moderate volume of stool in the colon. Sigmoid colon is redundant. Lymphatic: Prominent periportal and upper mesenteric nodes. Reproductive: Enlarged prostate spans 5.4 cm. Other: No free air.  No ascites. Musculoskeletal: Right hip arthroplasty. Diffuse lumbar degenerative change. There are no acute or suspicious osseous abnormalities. Review of the MIP images confirms the above findings. IMPRESSION: 1. Fat stranding and wall thickening about the third and fourth portion of the duodenum, suspicious for duodenitis. There is also wall thickening in the pre pyloric stomach with question of faint adjacent fat  stranding, may represent peptic ulcer disease. Additional mild wall thickening involving small bowel in the left abdomen, nonspecific enteritis. 2. Mild edema about the pancreatic head and uncinate process. This is favored to be related to duodenal inflammation, however recommend correlation with pancreatic enzymes. 3. Cholelithiasis. Question mild gallbladder enhancement but no definite pericholecystic inflammation. 4. Nonobstructing right renal stone. 5. Enlarged prostate. 6. Stable left adrenal adenoma. Aortic Atherosclerosis (ICD10-I70.0). Electronically Signed   By: Chadwick Colonel M.D.   On: 02/27/2024 22:10    Microbiology: Results for orders placed or performed during the hospital encounter of 02/27/24  Culture, blood (routine x 2)     Status: Abnormal   Collection Time: 02/27/24  9:17 PM   Specimen: BLOOD LEFT FOREARM  Result Value Ref Range Status   Specimen Description BLOOD LEFT FOREARM  Final   Special Requests   Final    BOTTLES DRAWN AEROBIC AND ANAEROBIC Blood Culture adequate volume   Culture  Setup Time   Final    GRAM NEGATIVE RODS GRAM POSITIVE COCCI IN BOTH AEROBIC AND ANAEROBIC BOTTLES CRITICAL VALUE NOTED.  VALUE IS CONSISTENT WITH PREVIOUSLY REPORTED AND CALLED VALUE.    Culture (A)  Final    ESCHERICHIA COLI KLEBSIELLA PNEUMONIAE ENTEROCOCCUS FAECALIS SUSCEPTIBILITIES PERFORMED ON PREVIOUS CULTURE WITHIN THE LAST 5 DAYS. Performed at [facility], [address], [place], [place] [address]    Report Status 03/03/2024 FINAL  Final  Culture, blood (routine x 2)     Status: Abnormal   Collection Time: 02/27/24  9:22 PM   Specimen: BLOOD RIGHT FOREARM  Result Value Ref Range Status   Specimen Description BLOOD RIGHT FOREARM  Final   Special Requests   Final    BOTTLES DRAWN AEROBIC AND ANAEROBIC Blood Culture adequate volume   Culture  Setup Time   Final    GRAM NEGATIVE RODS GRAM POSITIVE COCCI IN BOTH AEROBIC AND ANAEROBIC BOTTLES CRITICAL RESULT  CALLED TO,  READ BACK BY AND VERIFIED WITH: Carmie Chough on [id number] @0830  by SM Performed at [facility], [address] [place], [place] [address]    Culture (A)  Final    ESCHERICHIA COLI KLEBSIELLA PNEUMONIAE ENTEROCOCCUS FAECALIS    Report Status 03/03/2024 FINAL  Final   Organism ID, Bacteria ESCHERICHIA COLI  Final   Organism ID, Bacteria KLEBSIELLA PNEUMONIAE  Final   Organism ID, Bacteria ESCHERICHIA COLI  Final   Organism ID, Bacteria KLEBSIELLA PNEUMONIAE  Final   Organism ID, Bacteria ENTEROCOCCUS FAECALIS  Final      Susceptibility   Escherichia coli - MIC*    AMPICILLIN 16 INTERMEDIATE Intermediate     CEFEPIME  <=0.12 SENSITIVE Sensitive     CEFTAZIDIME <=1 SENSITIVE Sensitive     CEFTRIAXONE  <=0.25 SENSITIVE Sensitive     CIPROFLOXACIN  <=0.25 SENSITIVE Sensitive     GENTAMICIN <=1 SENSITIVE Sensitive     IMIPENEM <=0.25 SENSITIVE Sensitive     TRIMETH/SULFA <=20 SENSITIVE Sensitive     AMPICILLIN/SULBACTAM 8 SENSITIVE Sensitive     PIP/TAZO <=4 SENSITIVE Sensitive ug/mL   Escherichia coli - KIRBY BAUER*    CEFAZOLIN  SENSITIVE Sensitive     * ESCHERICHIA COLI    ESCHERICHIA COLI   Enterococcus faecalis - MIC*    AMPICILLIN <=2 SENSITIVE Sensitive     VANCOMYCIN  <=0.5 SENSITIVE Sensitive     GENTAMICIN SYNERGY SENSITIVE Sensitive     * ENTEROCOCCUS FAECALIS   Klebsiella pneumoniae - MIC*    AMPICILLIN RESISTANT Resistant     CEFEPIME  <=0.12 SENSITIVE Sensitive     CEFTAZIDIME <=1 SENSITIVE Sensitive     CEFTRIAXONE  <=0.25 SENSITIVE Sensitive     CIPROFLOXACIN  <=0.25 SENSITIVE Sensitive     GENTAMICIN <=1 SENSITIVE Sensitive     IMIPENEM <=0.25 SENSITIVE Sensitive     TRIMETH/SULFA <=20 SENSITIVE Sensitive     AMPICILLIN/SULBACTAM 4 SENSITIVE Sensitive     PIP/TAZO <=4 SENSITIVE Sensitive ug/mL   Klebsiella pneumoniae - KIRBY BAUER*    CEFAZOLIN  SENSITIVE Sensitive     * KLEBSIELLA PNEUMONIAE    KLEBSIELLA PNEUMONIAE  Blood Culture ID Panel  (Reflexed)     Status: Abnormal   Collection Time: 02/27/24  9:22 PM  Result Value Ref Range Status   Enterococcus faecalis DETECTED (A) NOT DETECTED Final    Comment: CRITICAL RESULT CALLED TO, READ BACK BY AND VERIFIED WITH: PHARMD Emily on [id number] @0830  by SM    Enterococcus Faecium NOT DETECTED NOT DETECTED Final   Listeria monocytogenes NOT DETECTED NOT DETECTED Final   Staphylococcus species NOT DETECTED NOT DETECTED Final   Staphylococcus aureus (BCID) NOT DETECTED NOT DETECTED Final   Staphylococcus epidermidis NOT DETECTED NOT DETECTED Final   Staphylococcus lugdunensis NOT DETECTED NOT DETECTED Final   Streptococcus species NOT DETECTED NOT DETECTED Final   Streptococcus agalactiae NOT DETECTED NOT DETECTED Final   Streptococcus pneumoniae NOT DETECTED NOT DETECTED Final   Streptococcus pyogenes NOT DETECTED NOT DETECTED Final   A.calcoaceticus-baumannii NOT DETECTED NOT DETECTED Final   Bacteroides fragilis NOT DETECTED NOT DETECTED Final   Enterobacterales DETECTED (A) NOT DETECTED Final    Comment: CRITICAL RESULT CALLED TO, READ BACK BY AND VERIFIED WITH: PHARMD Emily on [id number] @0830  by SM    Enterobacter cloacae complex NOT DETECTED NOT DETECTED Final   Escherichia coli DETECTED (A) NOT DETECTED Final    Comment: CRITICAL RESULT CALLED TO, READ BACK BY AND VERIFIED WITH: PHARMD Emily on [id number] @0830  by SM    Klebsiella aerogenes  NOT DETECTED NOT DETECTED Final   Klebsiella oxytoca NOT DETECTED NOT DETECTED Final   Klebsiella pneumoniae DETECTED (A) NOT DETECTED Final    Comment: CRITICAL RESULT CALLED TO, READ BACK BY AND VERIFIED WITH: PHARMD Emily on [id number] @0830  by SM    Proteus species NOT DETECTED NOT DETECTED Final   Salmonella species NOT DETECTED NOT DETECTED Final   Serratia marcescens NOT DETECTED NOT DETECTED Final   Haemophilus influenzae NOT DETECTED NOT DETECTED Final   Neisseria meningitidis NOT DETECTED NOT DETECTED Final   Pseudomonas aeruginosa  NOT DETECTED NOT DETECTED Final   Stenotrophomonas maltophilia NOT DETECTED NOT DETECTED Final   Candida albicans NOT DETECTED NOT DETECTED Final   Candida auris NOT DETECTED NOT DETECTED Final   Candida glabrata NOT DETECTED NOT DETECTED Final   Candida krusei NOT DETECTED NOT DETECTED Final   Candida parapsilosis NOT DETECTED NOT DETECTED Final   Candida tropicalis NOT DETECTED NOT DETECTED Final   Cryptococcus neoformans/gattii NOT DETECTED NOT DETECTED Final   CTX-M ESBL NOT DETECTED NOT DETECTED Final   Carbapenem resistance IMP NOT DETECTED NOT DETECTED Final   Carbapenem resistance KPC NOT DETECTED NOT DETECTED Final   Carbapenem resistance NDM NOT DETECTED NOT DETECTED Final   Carbapenem resist OXA 48 LIKE NOT DETECTED NOT DETECTED Final   Vancomycin  resistance NOT DETECTED NOT DETECTED Final   Carbapenem resistance VIM NOT DETECTED NOT DETECTED Final    Comment: Performed at [facility], [address], [place], [place] [address]  MRSA Next Gen by PCR, Nasal     Status: None   Collection Time: 02/28/24 12:43 AM   Specimen: STOOL; Nasal Swab  Result Value Ref Range Status   MRSA by PCR Next Gen NOT DETECTED NOT DETECTED Final    Comment: (NOTE) The GeneXpert MRSA Assay (FDA approved for NASAL specimens only), is one component of a comprehensive MRSA colonization surveillance program. It is not intended to diagnose MRSA infection nor to guide or monitor treatment for MRSA infections. Test performance is not FDA approved in patients less than 55 years old. Performed at [facility], [address] [place], [place] [address]   Gastrointestinal Panel by PCR , Stool     Status: None   Collection Time: 02/28/24  5:01 PM   Specimen: Stool  Result Value Ref Range Status   Campylobacter species NOT DETECTED NOT DETECTED Final   Plesimonas shigelloides NOT DETECTED NOT DETECTED Final   Salmonella species NOT DETECTED NOT DETECTED Final   Yersinia enterocolitica NOT  DETECTED NOT DETECTED Final   Vibrio species NOT DETECTED NOT DETECTED Final   Vibrio cholerae NOT DETECTED NOT DETECTED Final   Enteroaggregative E coli (EAEC) NOT DETECTED NOT DETECTED Final   Enteropathogenic E coli (EPEC) NOT DETECTED NOT DETECTED Final   Enterotoxigenic E coli (ETEC) NOT DETECTED NOT DETECTED Final   Shiga like toxin producing E coli (STEC) NOT DETECTED NOT DETECTED Final   Shigella/Enteroinvasive E coli (EIEC) NOT DETECTED NOT DETECTED Final   Cryptosporidium NOT DETECTED NOT DETECTED Final   Cyclospora cayetanensis NOT DETECTED NOT DETECTED Final   Entamoeba histolytica NOT DETECTED NOT DETECTED Final   Giardia lamblia NOT DETECTED NOT DETECTED Final   Adenovirus F40/41 NOT DETECTED NOT DETECTED Final   Astrovirus NOT DETECTED NOT DETECTED Final   Norovirus GI/GII NOT DETECTED NOT DETECTED Final   Rotavirus A NOT DETECTED NOT DETECTED Final   Sapovirus (I, II, IV, and V) NOT DETECTED NOT DETECTED Final  Comment: Performed at [facility], [address] [place], [place] [address]  Culture, blood (Routine X 2) w Reflex to ID Panel     Status: None (Preliminary result)   Collection Time: 02/29/24  9:37 AM   Specimen: BLOOD  Result Value Ref Range Status   Specimen Description BLOOD SITE NOT SPECIFIED  Final   Special Requests   Final    BOTTLES DRAWN AEROBIC AND ANAEROBIC Blood Culture adequate volume   Culture   Final    NO GROWTH 4 DAYS Performed at [facility], [address] [place], [place] [address]    Report Status PENDING  Incomplete  Culture, blood (Routine X 2) w Reflex to ID Panel     Status: None (Preliminary result)   Collection Time: 02/29/24 10:42 AM   Specimen: BLOOD RIGHT HAND  Result Value Ref Range Status   Specimen Description BLOOD RIGHT HAND  Final   Special Requests   Final    BOTTLES DRAWN AEROBIC ONLY Blood Culture results may not be optimal due to an inadequate volume of blood received in culture bottles    Culture   Final    NO GROWTH 4 DAYS Performed at [facility], [address] [place], [place] [address]    Report Status PENDING  Incomplete  C Difficile Quick Screen (NO PCR Reflex)     Status: Abnormal   Collection Time: 03/02/24 12:50 AM   Specimen: STOOL  Result Value Ref Range Status   C Diff antigen POSITIVE (A) NEGATIVE Final   C Diff toxin NEGATIVE NEGATIVE Final   C Diff interpretation   Final    Results are indeterminate. Please contact the provider listed for your campus for C diff questions in AMION.    Comment: Performed at [facility], [address], [place], [place] [address]  Surgical PCR screen     Status: None   Collection Time: 03/03/24  2:55 AM   Specimen: Nasal Mucosa; Nasal Swab  Result Value Ref Range Status   MRSA, PCR NEGATIVE NEGATIVE Final   Staphylococcus aureus NEGATIVE NEGATIVE Final    Comment: (NOTE) The Xpert SA Assay (FDA approved for NASAL specimens in patients 52 years of age and older), is one component of a comprehensive surveillance program. It is not intended to diagnose infection nor to guide or monitor treatment. Performed at [facility], [address], [place], [place] [address]     Labs: CBC: Recent Labs  Lab 02/27/24 2054 02/28/24 0523 02/29/24 0312 03/01/24 0253 03/02/24 0350 03/03/24 0400 03/04/24 0446  WBC 19.3*   < > 29.7* 23.7* 16.7* 12.3* 11.6*  NEUTROABS 18.5*  --   --   --   --   --   --   HGB 17.0   < > 12.8* 12.6* 13.3 12.7* 12.4*  HCT 51.4   < > 38.1* 37.6* 39.6 38.5* 37.1*  MCV 91.0   < > 89.6 90.4 90.8 90.4 90.0  PLT 318   < > 129* 119* 113* 109* 134*   < > = values in this interval not displayed.   Basic Metabolic Panel: Recent Labs  Lab 02/28/24 0230 02/28/24 0523 02/29/24 0312 03/01/24 0253 03/02/24 0350 03/03/24 0400 03/04/24 0446  NA  --    < > 135 137 140 141 140  K  --    < > 3.9 3.6 4.0 3.5 3.8  CL  --    < > 104 106 110  110 107  CO2  --    < > 20* 23 24 25 24    GLUCOSE  --    < > 150* 95 127* 106* 171*  BUN  --    < > 28* 34* 25* 16 17  CREATININE  --    < > 1.48* 1.47* 1.26* 1.12 1.11  CALCIUM   --    < > 8.2* 8.2* 8.5* 8.2* 8.6*  MG 1.4*  --   --   --  2.2 2.0 1.8   < > = values in this interval not displayed.   Liver Function Tests: Recent Labs  Lab 02/28/24 0523 02/29/24 0312 03/01/24 0253 03/02/24 0350 03/04/24 0446  AST 257* 126* 66* 41 39  ALT 249* 189* 131* 95* 67*  ALKPHOS 115 97 87 102 99  BILITOT 3.1* 1.7* 1.0 0.8 0.6  PROT 4.6* 4.8* 4.9* 5.0* 4.7*  ALBUMIN 2.4* 2.3* 2.2* 2.2* 2.2*   CBG: Recent Labs  Lab 03/03/24 1134 03/03/24 1606 03/04/24 0013 03/04/24 0748 03/04/24 1128  GLUCAP 125* 187* 240* 157* 166*    Discharge time spent: 29 minutes.  Length of inpatient stay: 5 days  Signed: Jodeane Mulligan, DO [facility] 03/04/2024

## 2024-03-04 NOTE — TOC Transition Note (Signed)
 Transition of Care [facility]) - Discharge Note   Patient Details  Name: Omar [name] MRN: [id number] Date of Birth: September 13, 1946  Transition of Care [facility] CM/SW Contact:  Tom-Johnson, Angelique Ken, RN Phone Number: 03/04/2024, 12:25 PM   Clinical Narrative:     Patient is scheduled for discharge today.  Readmission Risk Assessment done. Outpatient referral, hospital f/u and discharge instructions on AVS. Oral Abx prescriptions sent to [facility] pharmacy and patient will receive meds prior discharge. Remainder of patient's prescription sent to [facility] in [place]. No TOC needs or recommendations noted. Family to transport at discharge.  No further TOC needs noted.      Final next level of care: Home/Self Care Barriers to Discharge: Barriers Resolved   Patient Goals and CMS Choice Patient states their goals for this hospitalization and ongoing recovery are:: To return home CMS Medicare.gov Compare Post Acute Care list provided to:: Patient Choice offered to / list presented to : NA      Discharge Placement                Patient to be transferred to facility by: Family      Discharge Plan and Services Additional resources added to the After Visit Summary for                  DME Arranged: N/A DME Agency: NA       HH Arranged: NA HH Agency: NA        Social Drivers of Health (SDOH) Interventions SDOH Screenings   Food Insecurity: No Food Insecurity (02/29/2024)  Housing: Low Risk  (02/29/2024)  Transportation Needs: No Transportation Needs (02/29/2024)  Utilities: Not At Risk (02/29/2024)  Depression (PHQ2-9): Low Risk  (07/06/2023)  Social Connections: Socially Integrated (02/29/2024)  Tobacco Use: Low Risk  (03/03/2024)     Readmission Risk Interventions    03/04/2024   12:23 PM  Readmission Risk Prevention Plan  Transportation Screening Complete  PCP or Specialist Appt within 5-7 Days Complete  Home Care Screening Complete  Medication Review  (RN CM) Referral to Pharmacy

## 2024-03-04 NOTE — Plan of Care (Signed)
  Problem: Education: Goal: Knowledge of General Education information will improve Description: Including pain rating scale, medication(s)/side effects and non-pharmacologic comfort measures Outcome: Progressing   Problem: Health Behavior/Discharge Planning: Goal: Ability to manage health-related needs will improve Outcome: Progressing   Problem: Clinical Measurements: Goal: Ability to maintain clinical measurements within normal limits will improve Outcome: Progressing Goal: Will remain free from infection Outcome: Progressing Goal: Diagnostic test results will improve Outcome: Progressing Goal: Cardiovascular complication will be avoided Outcome: Progressing   Problem: Activity: Goal: Risk for activity intolerance will decrease Outcome: Progressing   Problem: Nutrition: Goal: Adequate nutrition will be maintained Outcome: Progressing   Problem: Coping: Goal: Level of anxiety will decrease Outcome: Progressing   Problem: Elimination: Goal: Will not experience complications related to bowel motility Outcome: Progressing Goal: Will not experience complications related to urinary retention Outcome: Progressing   Problem: Pain Managment: Goal: General experience of comfort will improve and/or be controlled Outcome: Progressing   Problem: Safety: Goal: Ability to remain free from injury will improve Outcome: Progressing   Problem: Skin Integrity: Goal: Risk for impaired skin integrity will decrease Outcome: Progressing   Problem: Education: Goal: Ability to describe self-care measures that may prevent or decrease complications (Diabetes Survival Skills Education) will improve Outcome: Progressing Goal: Individualized Educational Video(s) Outcome: Progressing   Problem: Coping: Goal: Ability to adjust to condition or change in health will improve Outcome: Progressing   Problem: Fluid Volume: Goal: Ability to maintain a balanced intake and output will  improve Outcome: Progressing   Problem: Health Behavior/Discharge Planning: Goal: Ability to identify and utilize available resources and services will improve Outcome: Progressing Goal: Ability to manage health-related needs will improve Outcome: Progressing   Problem: Metabolic: Goal: Ability to maintain appropriate glucose levels will improve Outcome: Progressing   Problem: Nutritional: Goal: Maintenance of adequate nutrition will improve Outcome: Progressing Goal: Progress toward achieving an optimal weight will improve Outcome: Progressing   Problem: Skin Integrity: Goal: Risk for impaired skin integrity will decrease Outcome: Progressing   Problem: Tissue Perfusion: Goal: Adequacy of tissue perfusion will improve Outcome: Progressing

## 2024-03-04 NOTE — Progress Notes (Signed)
 Progress Note  1 Day Post-Op  Subjective: Pt eating eggs and grits this AM. Denies abdominal pain or nausea. He is passing a little flatus already. He would like to go home today if able.   Objective: Vital signs in last 24 hours: Temp:  [98 F (36.7 C)-98.8 F (37.1 C)] 98.3 F (36.8 C) (06/03 0746) Pulse Rate:  [52-93] 52 (06/03 0638) Resp:  [12-21] 16 (06/03 1610) BP: (118-159)/(66-91) 126/69 (06/03 9604) SpO2:  [93 %-97 %] 97 % (06/03 0638) Last BM Date : 03/02/24  Intake/Output from previous day: 06/02 0701 - 06/03 0700 In: 1140.6 [P.O.:240; I.V.:600; IV Piggyback:300.6] Out: 20 [Blood:20] Intake/Output this shift: No intake/output data recorded.  PE: General: pleasant, WD, obese male who is laying in bed in NAD HEENT: sclera anicteric Heart: regular, rate, and rhythm.   Lungs:  Respiratory effort nonlabored Abd: soft, NT, mild distention, incisions C/D/I Psych: A&Ox3 with an appropriate affect.    Lab Results:  Recent Labs    03/03/24 0400 03/04/24 0446  WBC 12.3* 11.6*  HGB 12.7* 12.4*  HCT 38.5* 37.1*  PLT 109* 134*   BMET Recent Labs    03/03/24 0400 03/04/24 0446  NA 141 140  K 3.5 3.8  CL 110 107  CO2 25 24  GLUCOSE 106* 171*  BUN 16 17  CREATININE 1.12 1.11  CALCIUM  8.2* 8.6*   PT/INR No results for input(s): "LABPROT", "INR" in the last 72 hours. CMP     Component Value Date/Time   NA 140 03/04/2024 0446   NA 138 12/29/2022 1605   K 3.8 03/04/2024 0446   CL 107 03/04/2024 0446   CO2 24 03/04/2024 0446   GLUCOSE 171 (H) 03/04/2024 0446   BUN 17 03/04/2024 0446   BUN 14 12/29/2022 1605   CREATININE 1.11 03/04/2024 0446   CREATININE 1.18 01/27/2021 0855   CALCIUM  8.6 (L) 03/04/2024 0446   PROT 4.7 (L) 03/04/2024 0446   PROT 6.2 12/29/2022 1605   ALBUMIN 2.2 (L) 03/04/2024 0446   ALBUMIN 4.0 12/29/2022 1605   AST 39 03/04/2024 0446   ALT 67 (H) 03/04/2024 0446   ALKPHOS 99 03/04/2024 0446   BILITOT 0.6 03/04/2024 0446    BILITOT 0.4 12/29/2022 1605   GFRNONAA >60 03/04/2024 0446   GFRNONAA 53 (L) 10/26/2020 1035   GFRAA 61 10/26/2020 1035   Lipase     Component Value Date/Time   LIPASE 272 (H) 03/02/2024 0350       Studies/Results: No results found.  Anti-infectives: Anti-infectives (From admission, onward)    Start     Dose/Rate Route Frequency Ordered Stop   03/02/24 1600  Ampicillin-Sulbactam (UNASYN) 3 g in sodium chloride  0.9 % 100 mL IVPB        3 g 200 mL/hr over 30 Minutes Intravenous Every 6 hours 03/02/24 1514     02/28/24 1400  piperacillin -tazobactam (ZOSYN ) IVPB 3.375 g  Status:  Discontinued        3.375 g 12.5 mL/hr over 240 Minutes Intravenous Every 8 hours 02/28/24 0842 03/02/24 1504   02/28/24 1000  metroNIDAZOLE  (FLAGYL ) IVPB 500 mg  Status:  Discontinued        500 mg 100 mL/hr over 60 Minutes Intravenous Every 12 hours 02/28/24 0045 02/28/24 0828   02/28/24 1000  ceFEPIme  (MAXIPIME ) 2 g in sodium chloride  0.9 % 100 mL IVPB  Status:  Discontinued        2 g 200 mL/hr over 30 Minutes Intravenous Every 12 hours 02/28/24  6433 02/28/24 0840   02/28/24 0930  piperacillin -tazobactam (ZOSYN ) IVPB 3.375 g  Status:  Discontinued        3.375 g 12.5 mL/hr over 240 Minutes Intravenous Every 8 hours 02/28/24 0840 02/28/24 0842   02/28/24 0930  piperacillin -tazobactam (ZOSYN ) IVPB 3.375 g        3.375 g 100 mL/hr over 30 Minutes Intravenous  Once 02/28/24 0842 02/28/24 0944   02/28/24 0100  vancomycin  (VANCOREADY) IVPB 1750 mg/350 mL  Status:  Discontinued        1,750 mg 175 mL/hr over 120 Minutes Intravenous Every 48 hours 02/28/24 0050 02/28/24 0823   02/27/24 2215  ceFEPIme  (MAXIPIME ) 2 g in sodium chloride  0.9 % 100 mL IVPB        2 g 200 mL/hr over 30 Minutes Intravenous  Once 02/27/24 2212 02/27/24 2328   02/27/24 2215  metroNIDAZOLE  (FLAGYL ) IVPB 500 mg        500 mg 100 mL/hr over 60 Minutes Intravenous  Once 02/27/24 2212 02/28/24 0031         Assessment/Plan Acute biliary pancreatitis  S/P ERCP 5/30 POD1 s/p lap cholecystectomy Dr. 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

## 2024-03-05 ENCOUNTER — Encounter (HOSPITAL_COMMUNITY): Payer: Self-pay

## 2024-03-05 ENCOUNTER — Telehealth: Payer: Self-pay | Admitting: Urology

## 2024-03-05 ENCOUNTER — Other Ambulatory Visit: Payer: Self-pay

## 2024-03-05 ENCOUNTER — Emergency Department (HOSPITAL_COMMUNITY)
Admission: EM | Admit: 2024-03-05 | Discharge: 2024-03-05 | Disposition: A | Discharge status: Home / Self Care | Attending: Emergency Medicine | Admitting: Emergency Medicine

## 2024-03-05 DIAGNOSIS — Z794 Long term (current) use of insulin: Secondary | ICD-10-CM | POA: Insufficient documentation

## 2024-03-05 DIAGNOSIS — E119 Type 2 diabetes mellitus without complications: Secondary | ICD-10-CM | POA: Diagnosis not present

## 2024-03-05 DIAGNOSIS — Z7984 Long term (current) use of oral hypoglycemic drugs: Secondary | ICD-10-CM | POA: Insufficient documentation

## 2024-03-05 DIAGNOSIS — I10 Essential (primary) hypertension: Secondary | ICD-10-CM | POA: Insufficient documentation

## 2024-03-05 DIAGNOSIS — R339 Retention of urine, unspecified: Secondary | ICD-10-CM | POA: Diagnosis not present

## 2024-03-05 DIAGNOSIS — Z8673 Personal history of transient ischemic attack (TIA), and cerebral infarction without residual deficits: Secondary | ICD-10-CM | POA: Insufficient documentation

## 2024-03-05 DIAGNOSIS — Z7901 Long term (current) use of anticoagulants: Secondary | ICD-10-CM | POA: Insufficient documentation

## 2024-03-05 DIAGNOSIS — Z79899 Other long term (current) drug therapy: Secondary | ICD-10-CM | POA: Insufficient documentation

## 2024-03-05 DIAGNOSIS — I2510 Atherosclerotic heart disease of native coronary artery without angina pectoris: Secondary | ICD-10-CM | POA: Diagnosis not present

## 2024-03-05 LAB — CULTURE, BLOOD (ROUTINE X 2)
Culture: NO GROWTH
Culture: NO GROWTH
Special Requests: ADEQUATE

## 2024-03-05 LAB — URINALYSIS, ROUTINE W REFLEX MICROSCOPIC
Bacteria, UA: NONE SEEN
Bilirubin Urine: NEGATIVE
Glucose, UA: 50 mg/dL — AB
Ketones, ur: NEGATIVE mg/dL
Leukocytes,Ua: NEGATIVE
Nitrite: NEGATIVE
Protein, ur: NEGATIVE mg/dL
Specific Gravity, Urine: 1.013 (ref 1.005–1.030)
pH: 5 (ref 5.0–8.0)

## 2024-03-05 NOTE — ED Triage Notes (Signed)
 Pt arrived via POV c/o acute urinary retention since yesterday, after Pt reports he was discharged home following a cholecystectomy. Pt called his PCP office and was advised he could go to their office and have a catheter inserted, but Pt refused. Pt advised by his PCP that if urinary retention persisted he would need to seek treatment in the ER.

## 2024-03-05 NOTE — Discharge Instructions (Signed)
 Keep your scheduled appointment in 2 days with urology. Follow Foley catheter care instructions until seen in the office.   Return to the ED with any fever, severe pain, if the urine stops draining and your abdominal pain returns, or for new concern.

## 2024-03-05 NOTE — Telephone Encounter (Signed)
 Pt call was returned to assess urgency complaint pt stated they wanted to drop of UA sample for potential uti NP [place] was notified and advise pt to come in for nurse visit to have catheter placed and f/u with NP in two days pt wife was advised pt stated that they would like to keep the Friday appt but pt declined coming into office today pt advised due to potential urinary retention if he cannot urinate on his own he is to go to the ED pt and pt wife voiced their understanding

## 2024-03-05 NOTE — ED Provider Notes (Signed)
 [place] EMERGENCY DEPARTMENT AT [facility] Provider Note   CSN: [id number] Arrival date & time: 03/05/24  1545     History  Chief Complaint  Patient presents with   Urinary Retention    Omar [name] is a 78 y.o. male.  Patient to ED with suprapubic abdominal pain and being unable to urinate for the past 24 hours. No vomiting, fever. He underwent recent ERCP and cholecystectomy, discharged home yesterday. No history of urinary retention.   The history is provided by the patient. No language interpreter was used.       Home Medications Prior to Admission medications   Medication Sig Start Date End Date Taking? Authorizing Provider  acebutolol  (SECTRAL ) 200 MG capsule Take 1 capsule (200 mg total) by mouth 2 (two) times daily. 12/14/23   Knox Perl, MD  acetaminophen  (TYLENOL ) 500 MG tablet Take 1,000 mg by mouth every 6 (six) hours as needed.    [provider]  amoxicillin-clavulanate (AUGMENTIN) 875-125 MG tablet Take 1 tablet by mouth every 12 (twelve) hours for 11 days. 03/04/24 03/15/24  Jodeane Mulligan, DO  apixaban  (ELIQUIS ) 5 MG TABS tablet Take 5 mg by mouth 2 (two) times daily.    [provider]  HYDROcodone-acetaminophen  (NORCO/VICODIN) 5-325 MG tablet Take by mouth. 01/23/24   [provider]  lisinopril  (ZESTRIL ) 5 MG tablet TAKE (1) TABLET BY MOUTH ONCE DAILY. 12/06/23   Tobi Fortes, MD  metFORMIN  (GLUCOPHAGE ) 500 MG tablet Take 1 tablet (500 mg total) by mouth 2 (two) times daily with a meal. 09/04/23   Tobi Fortes, MD  Multiple Vitamins-Minerals (ONE-A-DAY 50 PLUS PO) Take 1 tablet by mouth at bedtime.     [provider]  nitroGLYCERIN  (NITROSTAT ) 0.4 MG SL tablet PLACE 1 TAB UNDER TONGUE EVERY 5 MIN IF NEEDED FOR CHEST PAIN. MAY USE 3 TIMES.NO RELIEF CALL 911. 08/15/23   Knox Perl, MD  rosuvastatin  (CRESTOR ) 40 MG tablet Take 1 tablet (40 mg total) by mouth daily. Patient taking differently: Take 40 mg by  mouth at bedtime. 07/06/23   Tobi Fortes, MD  Semaglutide,0.25 or 0.5MG /DOS, 2 MG/3ML SOPN Inject 2 mg into the skin once a week. Inject 2mg  into skin on Sunday    [provider]  spironolactone  (ALDACTONE ) 50 MG tablet TAKE 1 TABLET BY MOUTH EVERY MORNING 03/03/24   Tobi Fortes, MD  tamsulosin  (FLOMAX ) 0.4 MG CAPS capsule Take 0.4 mg by mouth. 08/31/23   [provider]  torsemide  (DEMADEX ) 20 MG tablet Take 20 mg by mouth daily as needed (swelling). 12/19/16   [provider]  verapamil  (CALAN -SR) 240 MG CR tablet TAKE ONE TABLET BY MOUTH ONCE DAILY AT BEDTIME 03/03/24   Tobi Fortes, MD      Allergies    Empagliflozin     Review of Systems   Review of Systems  Physical Exam Updated Vital Signs BP (!) 143/80 (BP Location: Right Arm)   Pulse 87   Temp 98.2 F (36.8 C)   Resp 16   Ht 5\' 8"  (1.727 m)   Wt 98.4 kg   SpO2 96%   BMI 32.99 kg/m  Physical Exam Vitals and nursing note reviewed.  Constitutional:      Appearance: He is well-developed.  Pulmonary:     Effort: Pulmonary effort is normal.  Abdominal:     General: There is no distension.     Palpations: Abdomen is soft.     Tenderness: There is  no abdominal tenderness.     Comments: Examined s/p Foley insertion and drainage of 1400 cc urine. Laparoscopic incisions unremarkable without drainage or dehiscence.   Musculoskeletal:        General: Normal range of motion.     Cervical back: Normal range of motion.  Skin:    General: Skin is warm and dry.  Neurological:     Mental Status: He is alert and oriented to person, place, and time.     ED Results / Procedures / Treatments   Labs (all labs ordered are listed, but only abnormal results are displayed) Labs Reviewed  URINALYSIS, ROUTINE W REFLEX MICROSCOPIC - Abnormal; Notable for the following components:      Result Value   Glucose, UA 50 (*)    Hgb urine dipstick MODERATE (*)    All other components within normal limits   URINE CULTURE    EKG None  Radiology No results found.  Procedures Procedures    Medications Ordered in ED Medications - No data to display  ED Course/ Medical Decision Making/ A&P                                 Medical Decision Making This patient presents to the ED for concern of urinary retention, this involves an extensive number of treatment options, and is a complaint that carries with it a high risk of complications and morbidity.  The differential diagnosis includes obstruction, mass, infection, neurogenic   Co morbidities that complicate the patient evaluation  Recent surgery, obesity, HTN, HLD, CAD, DVT, Factor V Leiden mutation, CVA, T2DM   Additional history obtained:  Additional history and/or information obtained from chart review, notable for Recent hospitalization Dx septic shock   Lab Tests:  I Ordered, and personally interpreted labs.  The pertinent results include:   UA - negative for infection  Urine Culture pending    Imaging Studies ordered:  I ordered imaging studies including n/a I independently visualized and interpreted imaging which showed n/a I agree with the radiologist interpretation   Cardiac Monitoring:  The patient was maintained on a cardiac monitor.  I personally viewed and interpreted the cardiac monitored which showed an underlying rhythm of: n/a     Test Considered:  Na/   Critical Interventions:  N/a   Consultations Obtained:  I requested consultation with the n/a,  and discussed lab and imaging findings as well as pertinent plan - they recommend: n/a   Problem List / ED Course:  Here with urinary retention since yesterday. Recent abdominal surgery. No complaints relating to recent cholecystectomy - no increased pain. No fever. No upper abdominal pain.  Foley inserted with 1400 cc urine into bag. Clear. UA negative. Leg bag provided - foley catheter care instructions.  He has an already scheduled  appointment with urology in 2 days.    Reevaluation:  After the interventions noted above, I reevaluated the patient and found that they have :improved   Social Determinants of Health:  Lives with wife   Disposition:  After consideration of the diagnostic results and the patients response to treatment, I feel that the patient would benefit from discharge home.   Amount and/or Complexity of Data Reviewed Labs: ordered.           Final Clinical Impression(s) / ED Diagnoses Final diagnoses:  Urinary retention    Rx / DC Orders ED Discharge Orders     None  Mandy Second, PA-C 03/05/24 1753    Sueellen Emery, MD 03/05/24 [id number]

## 2024-03-05 NOTE — Telephone Encounter (Signed)
 Had gallbladder removed and came home yesterday. He has urgency but cannot go. She thinks UTI  Dysuria  Patient called with c/o dysuria x 24 hours days.  Pain:  no pain Severity:1/10  Associated Signs and Symptoms:  Fever: noTemp.0 Chills: no Hematuria: no Urgency: yes Frequency: yes Hesitancy:yes Incontinence: no Nausea: no Vomiting: no  Message sent to clinic staff to return call to patient to advise of plan.

## 2024-03-06 ENCOUNTER — Telehealth: Payer: Self-pay

## 2024-03-06 DIAGNOSIS — N138 Other obstructive and reflux uropathy: Secondary | ICD-10-CM | POA: Insufficient documentation

## 2024-03-06 DIAGNOSIS — E119 Type 2 diabetes mellitus without complications: Secondary | ICD-10-CM | POA: Diagnosis not present

## 2024-03-06 DIAGNOSIS — N401 Enlarged prostate with lower urinary tract symptoms: Secondary | ICD-10-CM | POA: Insufficient documentation

## 2024-03-06 LAB — URINE CULTURE: Culture: NO GROWTH

## 2024-03-06 NOTE — Progress Notes (Deleted)
 Name: Omar [name] DOB: March 04, 1946 MRN: [id number]  History of Present Illness: Mr. Omar [name] is a 78 y.o. male who presents today for follow up visit at [facility].  Relevant History includes: 1. BPH. - Family history of prostate cancer (father). 2. 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 3. Erectile dysfunction. 4. Kidney stone. - 02/27/2024: CT showed a non-obstructing 5 mm right renal stone.  At last visit with Dr. Joie Narrow on 10/23/2023: Doing well.   Since last visit: > 02/27/2024 - 03/04/2024: Admitted for septic shock secondary to choledocholithiasis and pancreatitis with likely cholecystitis. Underwent ERCP on 02/29/2024 and laparoscopic cholecystectomy on 03/03/2024.  > 03/05/2024: Seen in ER for urinary retention. Foley inserted with 1400 cc urine into bag. Urine microscopy: 11-20 RBC/hpf, otherwise unremarkable. Urine culture ***pending. Foley catheter placed.   ***likely postop temporary bladder areflexia ***voiding trial  Today: Pt reports chief complaint of urinary retention.  He {Actions; denies-reports:120008} history of urinary retention prior to current episode. He reports the catheter is draining ***well. He {Actions; denies-reports:120008} gross hematuria.  He {Actions; denies-reports:120008} flank pain. He {Actions; denies-reports:120008} abdominal pain. He {Actions; denies-reports:120008} fevers/chills/sweats. He {Actions; denies-reports:120008} nausea/vomiting.  He {Actions; denies-reports:120008} taking alpha blocker at this time(*** started ***). He {Actions; denies-reports:120008} current use of narcotic pain control. He {Actions; denies-reports:120008} current use of medications with anticholinergic potential (***).  He {Actions; denies-reports:120008} constipation. He {Actions; denies-reports:120008} taking laxatives, stool softeners, or fiber supplements.   Medications: Current Outpatient Medications  Medication Sig Dispense Refill    acebutolol  (SECTRAL ) 200 MG capsule Take 1 capsule (200 mg total) by mouth 2 (two) times daily. 180 capsule 3   acetaminophen  (TYLENOL ) 500 MG tablet Take 1,000 mg by mouth every 6 (six) hours as needed.     amoxicillin-clavulanate (AUGMENTIN) 875-125 MG tablet Take 1 tablet by mouth every 12 (twelve) hours for 11 days. 22 tablet 0   apixaban  (ELIQUIS ) 5 MG TABS tablet Take 5 mg by mouth 2 (two) times daily.     HYDROcodone-acetaminophen  (NORCO/VICODIN) 5-325 MG tablet Take by mouth.     lisinopril  (ZESTRIL ) 5 MG tablet TAKE (1) TABLET BY MOUTH ONCE DAILY. 90 tablet 3   metFORMIN  (GLUCOPHAGE ) 500 MG tablet Take 1 tablet (500 mg total) by mouth 2 (two) times daily with a meal. 180 tablet 3   Multiple Vitamins-Minerals (ONE-A-DAY 50 PLUS PO) Take 1 tablet by mouth at bedtime.      nitroGLYCERIN  (NITROSTAT ) 0.4 MG SL tablet PLACE 1 TAB UNDER TONGUE EVERY 5 MIN IF NEEDED FOR CHEST PAIN. MAY USE 3 TIMES.NO RELIEF CALL 911. 25 tablet 3   rosuvastatin  (CRESTOR ) 40 MG tablet Take 1 tablet (40 mg total) by mouth daily. (Patient taking differently: Take 40 mg by mouth at bedtime.) 90 tablet 3   Semaglutide,0.25 or 0.5MG /DOS, 2 MG/3ML SOPN Inject 2 mg into the skin once a week. Inject 2mg  into skin on Sunday     spironolactone  (ALDACTONE ) 50 MG tablet TAKE 1 TABLET BY MOUTH EVERY MORNING 90 tablet 1   tamsulosin  (FLOMAX ) 0.4 MG CAPS capsule Take 0.4 mg by mouth.     torsemide  (DEMADEX ) 20 MG tablet Take 20 mg by mouth daily as needed (swelling).     verapamil  (CALAN -SR) 240 MG CR tablet TAKE ONE TABLET BY MOUTH ONCE DAILY AT BEDTIME 90 tablet 0   Current Facility-Administered Medications  Medication Dose Route Frequency Provider Last Rate Last Admin   testosterone  cypionate (DEPOTESTOSTERONE CYPIONATE) injection 200 mg  200 mg Intramuscular Weekly Wrenn, John, MD  200 mg at 02/21/24 1051    Allergies: Allergies  Allergen Reactions   Empagliflozin      Past Medical History:  Diagnosis Date    Abnormal EKG    Inferior Q waves   BPH (benign prostatic hyperplasia)    Chronic anticoagulation 01/16/2017   Coronary artery disease    Degenerative joint disease    Diabetes mellitus, type 2 (HCC)    No insulin    DVT (deep venous thrombosis) (HCC) 4 -5 yrs ago   DVT of leg (deep venous thrombosis) (HCC) 07/02/2015   Heart murmur    Hepatic steatosis    Heterozygous factor V Leiden mutation (HCC) 01/16/2017   Hyperlipidemia    Hypertension    Echo in 2008-technically limited, mild LVH, normal EF; 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 Joleen Navy interpreted the study-CPAP recommended, but refused by patient   Stroke [facility])    no deficits from stroke, "Didnt know I had one when they say I did".   Past Surgical History:  Procedure Laterality Date   BIOPSY  04/16/2019   Procedure: BIOPSY;  Surgeon: Alyce Jubilee, MD;  Location: [facility];  Service: Endoscopy;;   CATARACT EXTRACTION W/PHACO Left 04/03/2016   Procedure: CATARACT EXTRACTION PHACO AND INTRAOCULAR LENS PLACEMENT LEFT EYE CDE=21.76;  Surgeon: Clay Cummins, MD;  Location: [facility];  Service: Ophthalmology;  Laterality: Left;   CATARACT EXTRACTION W/PHACO Right 06/19/2016   Procedure: CATARACT EXTRACTION PHACO AND INTRAOCULAR LENS PLACEMENT; CDE:  11.56;  Surgeon: Clay Cummins, MD;  Location: [facility];  Service: Ophthalmology;  Laterality: Right;   CHOLECYSTECTOMY N/A 03/03/2024   Procedure: LAPAROSCOPIC CHOLECYSTECTOMY;  Surgeon: Shela Derby, MD;  Location: [facility] OR;  Service: General;  Laterality: N/A;  with indocyanine green   COLONOSCOPY N/A 04/16/2019   Procedure: COLONOSCOPY;  Surgeon: Alyce Jubilee, MD;  Location: [facility];  Service: Endoscopy;  Laterality: N/A;  8:30am   COLONOSCOPY W/ POLYPECTOMY  05/2008   polypectomy  x4-adenomatous; internal hemorrhoids   CORONARY ANGIOPLASTY WITH STENT PLACEMENT  06/04/2012     DES    to RI   CORONARY STENT PLACEMENT     x 1   DUPUYTREN / PALMAR FASCIOTOMY  2009   rt hand-dsc-went home same day x2   ERCP N/A 02/29/2024   Procedure: ERCP, WITH INTERVENTION IF INDICATED;  Surgeon: Lajuan Pila, MD;  Location: [facility] ENDOSCOPY;  Service: Gastroenterology;  Laterality: N/A;   ESOPHAGOGASTRODUODENOSCOPY N/A 04/16/2019   Procedure: ESOPHAGOGASTRODUODENOSCOPY (EGD);  Surgeon: Alyce Jubilee, MD;  [facility]: [facility];  Service: Endoscopy;  Laterality: N/A;   LEFT HEART CATHETERIZATION WITH CORONARY ANGIOGRAM N/A 05/28/2012   Procedure: LEFT HEART CATHETERIZATION WITH CORONARY ANGIOGRAM;  Surgeon: Jessica Morn, MD;  Location: [facility] CATH LAB;  Service: Cardiovascular;  Laterality: N/A;   PERCUTANEOUS CORONARY STENT INTERVENTION (PCI-S) N/A 06/04/2012   Procedure: PERCUTANEOUS CORONARY STENT INTERVENTION (PCI-S);  Surgeon: Jessica Morn, MD;  Location: [facility] CATH LAB;  Service: Cardiovascular;  Laterality: N/A;   POLYPECTOMY  04/16/2019   Procedure: POLYPECTOMY;  Surgeon: Alyce Jubilee, MD;  Location: [facility];  Service: Endoscopy;;   TONSILLECTOMY     TOTAL HIP ARTHROPLASTY Right 06/24/2019   Procedure: TOTAL HIP ARTHROPLASTY;  Surgeon: Osa Blase, MD;  Location: [facility] ORS;  Service: Orthopedics;  Laterality: Right;   VENTRAL HERNIA REPAIR  2010   umb hernia-[facility]-went home same day   Family History  Problem Relation Age of Onset   Cancer Mother    Brain cancer Mother    Cancer Father    Prostate cancer Father    Breast cancer Daughter    Thyroid  cancer Daughter    Coronary artery disease Neg Hx    Colon cancer Neg Hx    Social History   Socioeconomic History   Marital status: Married    Spouse name: Not on file   Number of children: 2   Years of education: Not on file   Highest education level: Not on file  Occupational History   Occupation: Airline pilot     Comment: Doctor, general practice  Tobacco Use   Smoking status: Never    Passive exposure: Never   Smokeless tobacco: Never  Vaping Use   Vaping status: Never Used  Substance and Sexual Activity   Alcohol use: No   Drug use: No   Sexual activity: Not Currently    Birth control/protection: None  Other Topics Concern   Not on file  Social History Narrative   Married for 54 yrs,lives with wife.Works  part time at [employer].   Social Drivers of Corporate investment banker Strain: Not on file  Food Insecurity: No Food Insecurity (02/29/2024)   Hunger Vital Sign    Worried About Running Out of Food in the Last Year: Never true    Ran Out of Food in the Last Year: Never true  Transportation Needs: No Transportation Needs (02/29/2024)   PRAPARE - Administrator, Civil Service (Medical): No    Lack of Transportation (Non-Medical): No  Physical Activity: Not on file  Stress: Not on file  Social Connections: Socially Integrated (02/29/2024)   Social Connection and Isolation Panel [NHANES]    Frequency of Communication with Friends and Family: More than three times a week    Frequency of Social Gatherings with Friends and Family: Twice a week    Attends Religious Services: 1 to 4 times per year    Active Member of [employer] or Organizations: Yes    Attends Banker Meetings: 1 to 4 times per year    Marital Status: Married  Catering manager Violence: Not At Risk (02/29/2024)   Humiliation, Afraid, Rape, and Kick questionnaire    Fear of Current or Ex-Partner: No    Emotionally Abused: No    Physically Abused: No    Sexually Abused: No    Review of Systems Constitutional: Patient denies any unintentional weight loss or change in strength lntegumentary: Patient denies any rashes or pruritus Cardiovascular: Patient denies chest pain or syncope Respiratory: Patient denies shortness of breath Gastrointestinal: ***Patient denies nausea, vomiting,  constipation, or diarrhea ***As per HPI Musculoskeletal: Patient denies muscle cramps or weakness Neurologic: Patient denies convulsions or seizures Allergic/Immunologic: Patient denies recent allergic reaction(s) Hematologic/Lymphatic: Patient denies bleeding tendencies Endocrine: Patient denies heat/cold intolerance  GU: As per HPI.  OBJECTIVE There were no vitals filed for this visit. There is no height or weight on file to calculate BMI.  Physical Examination Constitutional: No obvious distress; patient is non-toxic appearing  Cardiovascular: No visible lower extremity edema.  Respiratory: The patient does not have audible wheezing/stridor; respirations do not appear labored  Gastrointestinal: Abdomen non-distended Musculoskeletal: Normal ROM of UEs  Skin: No obvious rashes/open sores  Neurologic: CN 2-12  grossly intact Psychiatric: Answered questions appropriately with normal affect  Hematologic/Lymphatic/Immunologic: No obvious bruises or sites of spontaneous bleeding  UA: ***negative ***positive for *** leukocytes, *** blood, ***nitrites Urine microscopy: *** WBC/hpf, *** RBC/hpf, *** bacteria ***glucosuria (secondary to ***Jardiance  ***Farxiga use) ***otherwise unremarkable  PVR: *** ml  ASSESSMENT No diagnosis found. ***  We agreed to plan for follow up in *** months / ***1 year or sooner if needed. Patient verbalized understanding of and agreement with current plan. All questions were answered.  PLAN Advised the following: 1. *** 2. ***No follow-ups on file.  No orders of the defined types were placed in this encounter.   It has been explained that the patient is to follow regularly with their PCP in addition to all other providers involved in their care and to follow instructions provided by these respective offices. Patient advised to contact urology clinic if any urologic-pertaining questions, concerns, new symptoms or problems arise in the interim  period.  There are no Patient Instructions on file for this visit.  Electronically signed by:  Lauretta Ponto, FNP   03/06/24    9:50 AM

## 2024-03-06 NOTE — Telephone Encounter (Signed)
-----   Message from Lauretta Ponto sent at 03/06/2024 10:03 AM EDT ----- Patient had surgery last week and went to ER yesterday for retention / had Foley placed. He's on my schedule for tomorrow at 12:30pm but can't have voiding trial at that time. Please let him know that my recommendation is to reschedule for voiding trial in 1-2 weeks (suspect that he simply has temporary bladder areflexia from the anesthesia he received during his two recent surgical procedures, which will likely resolve with another week or two).

## 2024-03-06 NOTE — Telephone Encounter (Signed)
 Patient/wife is made aware and voiced understanding.

## 2024-03-07 ENCOUNTER — Ambulatory Visit: Admitting: Urology

## 2024-03-07 ENCOUNTER — Ambulatory Visit (INDEPENDENT_AMBULATORY_CARE_PROVIDER_SITE_OTHER)

## 2024-03-07 DIAGNOSIS — E291 Testicular hypofunction: Secondary | ICD-10-CM | POA: Diagnosis not present

## 2024-03-07 MED ORDER — TESTOSTERONE CYPIONATE 200 MG/ML IM SOLN: 200.0000 mg | Freq: Once | INTRAMUSCULAR | Status: DC

## 2024-03-07 NOTE — Addendum Note (Signed)
 Addended by: Dyke Glasser on: 03/07/2024 10:16 AM   Modules accepted: Level of Service

## 2024-03-07 NOTE — Progress Notes (Addendum)
 Patient receiving Testosterone  injection per MD order  The injection site was cleaned and prepped with alcohol. A band aid applied after injection given.   IM injection  Medication: Testosterone  Dose:.88mL Location: right anterior thigh Lot: [id number] Exp: 05/02/2025  Patient tolerated well, no complications were noted  Performed by: Melvenia Stabs. CMA

## 2024-03-10 ENCOUNTER — Ambulatory Visit: Payer: Self-pay

## 2024-03-10 ENCOUNTER — Telehealth: Payer: Self-pay | Admitting: Urology

## 2024-03-10 NOTE — Telephone Encounter (Signed)
 Patient/wife was made aware to contact PCP regarding leg swelling. Patient/wife voiced understanding.

## 2024-03-10 NOTE — Telephone Encounter (Signed)
 Says legs are swollen. Wants to know if we can call in a fluid pill

## 2024-03-10 NOTE — Telephone Encounter (Signed)
   FYI Only or Action Required?: FYI only for provider  Patient was last seen in primary care on 07/06/2023 by Tobi Fortes, MD. Called Nurse Triage reporting Leg Swelling. Symptoms began several days ago. Interventions attempted: Nothing. Symptoms are: stable.  Triage Disposition: See Physician Within 24 Hours  Patient/caregiver understands and will follow disposition?: Yes                             Copied from CRM [id number]. Topic: Clinical - Red Word Triage >> Mar 10, 2024  4:24 PM Everette C wrote: [facility] that prompted transfer to Nurse Triage: The patient has began to experience swelling in both of their ankles and lower legs starting this weekend Reason for Disposition  [1] MODERATE leg swelling (e.g., swelling extends up to knees) AND [2] new-onset or worsening  Answer Assessment - Initial Assessment Questions 1. ONSET: "When did the swelling start?" (e.g., minutes, hours, days)     Started over the weekend 2. LOCATION: "What part of the leg is swollen?"  "Are both legs swollen or just one leg?"     Bilateral legs 3. SEVERITY: "How bad is the swelling?" (e.g., localized; mild, moderate, severe)   - Localized: Small area of swelling localized to one leg.   - MILD pedal edema: Swelling limited to foot and ankle, pitting edema < 1/4 inch (6 mm) deep, rest and elevation eliminate most or all swelling.   - MODERATE edema: Swelling of lower leg to knee, pitting edema > 1/4 inch (6 mm) deep, rest and elevation only partially reduce swelling.   - SEVERE edema: Swelling extends above knee, facial or hand swelling present.      Moderate- ankle to knee 4. REDNESS: "Does the swelling look red or infected?"     Denies 5. PAIN: "Is the swelling painful to touch?" If Yes, ask: "How painful is it?"   (Scale 1-10; mild, moderate or severe)     Denies 6. FEVER: "Do you have a fever?" If Yes, ask: "What is it, how was it measured, and when did it start?"       Denies 7. CAUSE: "What do you think is causing the leg swelling?"     Unsure, states patient has had leg swelling periodically 8. MEDICAL HISTORY: "Do you have a history of blood clots (e.g., DVT), cancer, heart failure, kidney disease, or liver failure?"     States patient has a history of blood clots and is on Eliquis   9. RECURRENT SYMPTOM: "Have you had leg swelling before?" If Yes, ask: "When was the last time?" "What happened that time?"     Unsure 10. OTHER SYMPTOMS: "Do you have any other symptoms?" (e.g., chest pain, difficulty breathing)       Denies SOB, denies chest pain  Wife states patient recently had gallbladder surgery and has a catheter in place  Protocols used: Leg Swelling and Edema-A-AH

## 2024-03-11 ENCOUNTER — Encounter: Payer: Self-pay | Admitting: Internal Medicine

## 2024-03-11 ENCOUNTER — Ambulatory Visit (INDEPENDENT_AMBULATORY_CARE_PROVIDER_SITE_OTHER): Payer: Self-pay | Admitting: Internal Medicine

## 2024-03-11 VITALS — BP 112/71 | HR 92 | Ht 68.0 in | Wt 235.0 lb

## 2024-03-11 DIAGNOSIS — R339 Retention of urine, unspecified: Secondary | ICD-10-CM | POA: Diagnosis not present

## 2024-03-11 DIAGNOSIS — M7989 Other specified soft tissue disorders: Secondary | ICD-10-CM | POA: Diagnosis not present

## 2024-03-11 DIAGNOSIS — E8809 Other disorders of plasma-protein metabolism, not elsewhere classified: Secondary | ICD-10-CM | POA: Insufficient documentation

## 2024-03-11 DIAGNOSIS — K8050 Calculus of bile duct without cholangitis or cholecystitis without obstruction: Secondary | ICD-10-CM

## 2024-03-11 DIAGNOSIS — E46 Unspecified protein-calorie malnutrition: Secondary | ICD-10-CM | POA: Diagnosis not present

## 2024-03-11 MED ORDER — TORSEMIDE 20 MG PO TABS: 20.0000 mg | ORAL_TABLET | Freq: Every day | ORAL | 1 refills | Status: AC

## 2024-03-11 NOTE — Assessment & Plan Note (Signed)
 Acute urinary retention in postop course after laparoscopic cholecystectomy He has urinary catheter in place Followed by urology

## 2024-03-11 NOTE — Patient Instructions (Signed)
 Please start taking protein supplement at least twice daily.  Please take Torsemide  as prescribed for leg swelling.  Please perform leg swelling and use compression socks as tolerated.  Please get blood test done in the next week.

## 2024-03-11 NOTE — Assessment & Plan Note (Addendum)
 Recently had laparoscopic cholecystectomy, had septic shock secondary to cholangitis, has completed antibiotics Likely contributing to bilateral leg swelling Advised to take protein supplement at least twice daily Check CMP after 1 week

## 2024-03-11 NOTE — Assessment & Plan Note (Addendum)
 S/p ERCP and later cholecystectomy Followed by general surgery Last CMP reviewed, liver enzymes have trended down, check CMP after 1 week

## 2024-03-11 NOTE — Progress Notes (Signed)
 Acute Office Visit  Subjective:    Patient ID: Omar [name], male    DOB: Apr 14, 1946, 78 y.o.   MRN: [id number]  Chief Complaint  Patient presents with   Leg Swelling    Reports leg swelling , started over the weekend.     HPI Patient is in today for complaint of bilateral leg swelling for the last 3 days.  Denies dyspnea, orthopnea or PND.  Denies chest pain or palpitations.  He has history of DVT, but is currently on Eliquis .  Of note, he was recently hospitalized with septic shock due to cholangitis, had laparoscopic cholecystectomy on 03/03/24.  Postop course was complicated with acute urinary retention, for which she had to have urinary catheter in place, and still has it.  He reports adequate urine output.  Of note, last CMP shows hypoalbuminemia.  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 Joleen Navy interpreted the study-CPAP recommended, but refused by patient   Stroke [facility])    no deficits from stroke, "Didnt know I had one when they say I did".    Past Surgical History:  Procedure Laterality Date   BIOPSY  04/16/2019   Procedure: BIOPSY;  Surgeon: Alyce Jubilee, MD;  Location: [facility];  Service: Endoscopy;;   CATARACT EXTRACTION W/PHACO  Left 04/03/2016   Procedure: CATARACT EXTRACTION PHACO AND INTRAOCULAR LENS PLACEMENT LEFT EYE CDE=21.76;  Surgeon: Clay Cummins, MD;  Location: [facility] ORS;  Service: Ophthalmology;  Laterality: Left;   CATARACT EXTRACTION W/PHACO Right 06/19/2016   Procedure: CATARACT EXTRACTION PHACO AND INTRAOCULAR LENS PLACEMENT; CDE:  11.56;  Surgeon: Clay Cummins, MD;  Location: [facility];  Service: Ophthalmology;  Laterality: Right;   CHOLECYSTECTOMY N/A 03/03/2024   Procedure: LAPAROSCOPIC CHOLECYSTECTOMY;  Surgeon: Shela Derby, MD;  Location: [facility] OR;  Service: General;  Laterality: N/A;  with indocyanine green    COLONOSCOPY N/A 04/16/2019   Procedure: COLONOSCOPY;  Surgeon: Alyce Jubilee, MD;  Location: [facility];  Service: Endoscopy;  Laterality: N/A;  8:30am   COLONOSCOPY W/ POLYPECTOMY  05/2008   polypectomy x4-adenomatous; internal hemorrhoids   CORONARY ANGIOPLASTY WITH STENT PLACEMENT  06/04/2012     DES    to RI   CORONARY STENT PLACEMENT     x 1   DUPUYTREN / PALMAR FASCIOTOMY  2009   rt hand-dsc-went home same day x2   ERCP N/A 02/29/2024   Procedure: ERCP, WITH INTERVENTION IF INDICATED;  Surgeon: Lajuan Pila, MD;  Location: [facility] ENDOSCOPY;  Service: Gastroenterology;  Laterality: N/A;   ESOPHAGOGASTRODUODENOSCOPY N/A 04/16/2019   Procedure: ESOPHAGOGASTRODUODENOSCOPY (EGD);  Surgeon: Alyce Jubilee, MD;  [facility];  Service: Endoscopy;  Laterality: N/A;   LEFT HEART CATHETERIZATION WITH CORONARY ANGIOGRAM N/A 05/28/2012   Procedure: LEFT HEART CATHETERIZATION WITH CORONARY ANGIOGRAM;  Surgeon: Jessica Morn, MD;  Location: [facility] CATH LAB;  Service: Cardiovascular;  Laterality: N/A;   PERCUTANEOUS CORONARY STENT INTERVENTION (PCI-S) N/A 06/04/2012   Procedure: PERCUTANEOUS CORONARY STENT INTERVENTION (PCI-S);  Surgeon: Jessica Morn, MD;  Location: [facility] CATH LAB;  Service: Cardiovascular;  Laterality: N/A;   POLYPECTOMY  04/16/2019   Procedure: POLYPECTOMY;  Surgeon: Alyce Jubilee, MD;  Location: [facility];  Service: Endoscopy;;   TONSILLECTOMY     TOTAL HIP ARTHROPLASTY Right 06/24/2019   Procedure: TOTAL HIP ARTHROPLASTY;  Surgeon: Osa Blase, MD;  Location: [facility] ORS;  Service: Orthopedics;  Laterality: Right;   VENTRAL HERNIA REPAIR  2010   umb hernia-[facility]-went home same day    Family History  Problem Relation Age of Onset   Cancer Mother    Brain cancer Mother    Cancer Father    Prostate cancer Father    Breast cancer Daughter    Thyroid  cancer Daughter    Coronary artery disease Neg Hx    Colon cancer Neg Hx     Social History   Socioeconomic History   Marital status: Married    Spouse name: Not on file   Number of children: 2   Years of education: Not on file   Highest education level: Not on file  Occupational History   Occupation: Airline pilot    Comment: Doctor, general practice  Tobacco Use   Smoking status: Never    Passive exposure: Never   Smokeless tobacco: Never  Vaping Use   Vaping status: Never Used  Substance and Sexual Activity   Alcohol use: No   Drug use: No   Sexual activity: Not Currently    Birth control/protection: None  Other Topics Concern   Not on file  Social History Narrative   Married for 54 yrs,lives with wife.Works  part time at [employer].   Social Drivers of Corporate investment banker Strain: Not on file  Food Insecurity: No Food Insecurity (02/29/2024)   Hunger Vital Sign    Worried About Running Out of Food in the Last Year: Never true    Ran Out of Food in the Last Year: Never true  Transportation Needs: No Transportation Needs (02/29/2024)   PRAPARE - Administrator, Civil Service (Medical): No    Lack of Transportation (Non-Medical): No  Physical Activity: Not on file  Stress: Not on file  Social Connections: Socially Integrated (02/29/2024)   Social Connection and Isolation Panel [NHANES]    Frequency of Communication with Friends and Family: More than  three times a week    Frequency of Social Gatherings with Friends and Family: Twice a week    Attends Religious Services: 1 to 4 times per year    Active Member of [employer] or Organizations: Yes    Attends Banker Meetings: 1 to 4 times per year    Marital Status: Married  Catering manager Violence: Not At Risk (02/29/2024)   Humiliation, Afraid, Rape, and Kick questionnaire    Fear of Current or Ex-Partner: No    Emotionally Abused: No    Physically Abused: No    Sexually Abused: No    Outpatient Medications Prior to Visit  Medication Sig Dispense Refill   acebutolol  (SECTRAL ) 200 MG capsule Take 1 capsule (200 mg  total) by mouth 2 (two) times daily. 180 capsule 3   acetaminophen  (TYLENOL ) 500 MG tablet Take 1,000 mg by mouth every 6 (six) hours as needed.     amoxicillin -clavulanate (AUGMENTIN ) 875-125 MG tablet Take 1 tablet by mouth every 12 (twelve) hours for 11 days. 22 tablet 0   apixaban  (ELIQUIS ) 5 MG TABS tablet Take 5 mg by mouth 2 (two) times daily.     HYDROcodone-acetaminophen  (NORCO/VICODIN) 5-325 MG tablet Take by mouth.     lisinopril  (ZESTRIL ) 5 MG tablet TAKE (1) TABLET BY MOUTH ONCE DAILY. 90 tablet 3   metFORMIN  (GLUCOPHAGE ) 500 MG tablet Take 1 tablet (500 mg total) by mouth 2 (two) times daily with a meal. 180 tablet 3   Multiple Vitamins-Minerals (ONE-A-DAY 50 PLUS PO) Take 1 tablet by mouth at bedtime.      nitroGLYCERIN  (NITROSTAT ) 0.4 MG SL tablet PLACE 1 TAB UNDER TONGUE EVERY 5 MIN IF NEEDED FOR CHEST PAIN. MAY USE 3 TIMES.NO RELIEF CALL 911. 25 tablet 3   rosuvastatin  (CRESTOR ) 40 MG tablet Take 1 tablet (40 mg total) by mouth daily. (Patient taking differently: Take 40 mg by mouth at bedtime.) 90 tablet 3   Semaglutide,0.25 or 0.5MG /DOS, 2 MG/3ML SOPN Inject 2 mg into the skin once a week. Inject 2mg  into skin on Sunday     spironolactone  (ALDACTONE ) 50 MG tablet TAKE 1 TABLET BY MOUTH EVERY MORNING 90 tablet 1   tamsulosin  (FLOMAX ) 0.4 MG CAPS  capsule Take 0.4 mg by mouth.     verapamil  (CALAN -SR) 240 MG CR tablet TAKE ONE TABLET BY MOUTH ONCE DAILY AT BEDTIME 90 tablet 0   torsemide  (DEMADEX ) 20 MG tablet Take 20 mg by mouth daily as needed (swelling).     Facility-Administered Medications Prior to Visit  Medication Dose Route Frequency Provider Last Rate Last Admin   testosterone  cypionate (DEPOTESTOSTERONE CYPIONATE) injection 200 mg  200 mg Intramuscular Weekly Wrenn, John, MD   200 mg at 03/07/24 0951   testosterone  cypionate (DEPOTESTOSTERONE CYPIONATE) injection 200 mg  200 mg Intramuscular Once Dahlstedt, Stephen, MD        Allergies  Allergen Reactions   Empagliflozin      Review of Systems  Constitutional:  Negative for chills and fever.  HENT:  Negative for congestion and sore throat.   Eyes:  Negative for pain and discharge.  Respiratory:  Negative for cough and shortness of breath.   Cardiovascular:  Positive for leg swelling. Negative for chest pain and palpitations.  Gastrointestinal:  Negative for diarrhea, nausea and vomiting.  Endocrine: Negative for polydipsia and polyuria.  Genitourinary:  Negative for dysuria and hematuria.  Musculoskeletal:  Negative for neck pain and neck stiffness.  Skin:  Negative for rash.  Neurological:  Negative for dizziness and weakness.  Psychiatric/Behavioral:  Negative for agitation and behavioral problems.        Objective:     Physical Exam Vitals reviewed.  Constitutional:      General: He is not in acute distress.    Appearance: He is not diaphoretic.  HENT:     Head: Normocephalic and atraumatic.     Nose: Nose normal.     Mouth/Throat:     Mouth: Mucous membranes are moist.  Eyes:     General: No scleral icterus.    Extraocular Movements: Extraocular movements intact.  Cardiovascular:     Rate and Rhythm: Normal rate and regular rhythm.     Heart sounds: Normal heart sounds. No murmur heard. Pulmonary:  Breath sounds: Normal breath sounds. No  wheezing or rales.  Musculoskeletal:     Cervical back: Neck supple. No tenderness.     Right lower leg: Edema (2+) present.     Left lower leg: Edema (2+) present.  Skin:    General: Skin is warm.     Findings: No rash.  Neurological:     General: No focal deficit present.     Mental Status: He is alert and oriented to person, place, and time.  Psychiatric:        Mood and Affect: Mood normal.        Behavior: Behavior normal.     BP 112/71   Pulse 92   Ht 5\' 8"  (1.727 m)   Wt 235 lb (106.6 kg)   SpO2 98%   BMI 35.73 kg/m  Wt Readings from Last 3 Encounters:  03/11/24 235 lb (106.6 kg)  03/05/24 217 lb (98.4 kg)  03/03/24 217 lb (98.4 kg)        Assessment & Plan:   Problem List Items Addressed This Visit       Digestive   Choledocholithiasis   S/p ERCP and later cholecystectomy Followed by general surgery Last CMP reviewed, liver enzymes have trended down, check CMP after 1 week        Genitourinary   Urinary retention   Acute urinary retention in postop course after laparoscopic cholecystectomy He has urinary catheter in place Followed by urology        Other   Leg swelling - Primary   Likely due to HFpEF and hypoalbuminemia Added torsemide  20 mg QD as needed, has tolerated it well in the past Continue spironolactone  and lisinopril  for HFpEF Advised perform leg elevation and use compression stockings as tolerated Advised to take protein supplement at least twice daily      Relevant Medications   torsemide  (DEMADEX ) 20 MG tablet   Other Relevant Orders   CMP14+EGFR   CBC   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    Dispense:  30 tablet    Refill:  1     Sharisse Rantz Alyssa Backbone, MD

## 2024-03-11 NOTE — Assessment & Plan Note (Signed)
 Likely due to HFpEF and hypoalbuminemia Added torsemide  20 mg QD as needed, has tolerated it well in the past Continue spironolactone  and lisinopril  for HFpEF Advised perform leg elevation and use compression stockings as tolerated Advised to take protein supplement at least twice daily

## 2024-03-13 ENCOUNTER — Ambulatory Visit

## 2024-03-21 ENCOUNTER — Ambulatory Visit

## 2024-03-21 DIAGNOSIS — E291 Testicular hypofunction: Secondary | ICD-10-CM | POA: Diagnosis not present

## 2024-03-21 DIAGNOSIS — Z978 Presence of other specified devices: Secondary | ICD-10-CM

## 2024-03-21 DIAGNOSIS — R339 Retention of urine, unspecified: Secondary | ICD-10-CM | POA: Diagnosis not present

## 2024-03-21 LAB — URINALYSIS, ROUTINE W REFLEX MICROSCOPIC
Bilirubin, UA: NEGATIVE
Ketones, UA: NEGATIVE
Leukocytes,UA: NEGATIVE
Nitrite, UA: NEGATIVE
Protein,UA: NEGATIVE
Specific Gravity, UA: 1.01 (ref 1.005–1.030)
Urobilinogen, Ur: 0.2 mg/dL (ref 0.2–1.0)
pH, UA: 6 (ref 5.0–7.5)

## 2024-03-21 LAB — MICROSCOPIC EXAMINATION
Bacteria, UA: NONE SEEN
WBC, UA: NONE SEEN /HPF (ref 0–5)

## 2024-03-21 MED ORDER — TESTOSTERONE CYPIONATE 200 MG/ML IM SOLN
200.0000 mg | Freq: Once | INTRAMUSCULAR | Status: AC
  Administered 2024-03-21: 200 mg via INTRAMUSCULAR

## 2024-03-21 MED ORDER — CIPROFLOXACIN HCL 500 MG PO TABS
500.0000 mg | ORAL_TABLET | Freq: Once | ORAL | Status: AC
  Administered 2024-03-21: 500 mg via ORAL

## 2024-03-21 NOTE — Progress Notes (Addendum)
 Patient receiving IM injection per MD order  The injection site was cleaned and prepped with alcohol. A band aid applied after injection given.   Testosterone  injection  Medication: Testosterone  Dose: .5mL Location: left Ventrogluteal Lot: [id number] Exp: 05/02/2025  Patient tolerated well, no complications were noted  Performed by: Exie T. CMA   Fill and Pull Catheter Removal  Patient is present today for a catheter removal.  of sterile water  was instilled into the bladder when the patient felt the urge to urinate. 10ml of water  was then drained from the balloon.  A 16FR foley cath was removed from the bladder no complications were noted .  Foley catheter intact and time of removal. Patient as then given some time to void on their own.  Patient cannot void  on their own after some time.  Patient tolerated well.  One oral prophylactic antibiotic given per MD orders  Performed by: Exie T. CMA  Follow up/ Additional notes: Patient asked to return today at 1PM for PVR  Bladder Scan completed today.  Patient cannot void prior to the bladder scan. Bladder scan result: [id number]  Performed By: Exie DASEN.CMA  Simple Catheter Placement  Due to urinary retention patient is present today for a foley cath placement.  Patient was cleaned and prepped in a sterile fashion with Betadinex3 . A 16 FR foley catheter was inserted, urine return was noted  , urine was Clear yellow in color.  The balloon was filled with 10cc of sterile water .  A leg bag was attached for drainage. Patient was also given a night bag to take home and was given instruction on how to change from one bag to another.  Patient was given instruction on proper catheter care.  Patient tolerated well, no complications were noted    Performed By: Exie DASEN. CMA  Additional notes-  Patient advised per MD McKenzie to increase Tamsulosin  to twice daily until scheduled nurse visit

## 2024-03-24 DIAGNOSIS — E46 Unspecified protein-calorie malnutrition: Secondary | ICD-10-CM | POA: Diagnosis not present

## 2024-03-24 DIAGNOSIS — M7989 Other specified soft tissue disorders: Secondary | ICD-10-CM | POA: Diagnosis not present

## 2024-03-24 DIAGNOSIS — E8809 Other disorders of plasma-protein metabolism, not elsewhere classified: Secondary | ICD-10-CM | POA: Diagnosis not present

## 2024-03-25 ENCOUNTER — Ambulatory Visit: Payer: Self-pay | Admitting: Internal Medicine

## 2024-03-25 LAB — CMP14+EGFR
ALT: 29 IU/L (ref 0–44)
AST: 27 IU/L (ref 0–40)
Albumin: 3.7 g/dL — ABNORMAL LOW (ref 3.8–4.8)
Alkaline Phosphatase: 90 IU/L (ref 44–121)
BUN/Creatinine Ratio: 18 (ref 10–24)
BUN: 18 mg/dL (ref 8–27)
Bilirubin Total: 0.4 mg/dL (ref 0.0–1.2)
CO2: 21 mmol/L (ref 20–29)
Calcium: 9.2 mg/dL (ref 8.6–10.2)
Chloride: 102 mmol/L (ref 96–106)
Creatinine, Ser: 1 mg/dL (ref 0.76–1.27)
Globulin, Total: 1.9 g/dL (ref 1.5–4.5)
Glucose: 169 mg/dL — ABNORMAL HIGH (ref 70–99)
Potassium: 4.8 mmol/L (ref 3.5–5.2)
Sodium: 137 mmol/L (ref 134–144)
Total Protein: 5.6 g/dL — ABNORMAL LOW (ref 6.0–8.5)
eGFR: 77 mL/min/{1.73_m2} (ref >59)

## 2024-03-25 LAB — CBC
Hematocrit: 41.9 % (ref 37.5–51.0)
Hemoglobin: 13.2 g/dL (ref 13.0–17.7)
MCH: 29.5 pg (ref 26.6–33.0)
MCHC: 31.5 g/dL (ref 31.5–35.7)
MCV: 94 fL (ref 79–97)
Platelets: 282 10*3/uL (ref 150–450)
RBC: 4.48 x10E6/uL (ref 4.14–5.80)
RDW: 14.5 % (ref 11.6–15.4)
WBC: 8 10*3/uL (ref 3.4–10.8)

## 2024-03-28 ENCOUNTER — Ambulatory Visit

## 2024-03-28 VITALS — BP 96/64 | HR 62 | Ht 68.0 in | Wt 235.0 lb

## 2024-03-28 DIAGNOSIS — I10 Essential (primary) hypertension: Secondary | ICD-10-CM

## 2024-03-28 DIAGNOSIS — K8050 Calculus of bile duct without cholangitis or cholecystitis without obstruction: Secondary | ICD-10-CM | POA: Diagnosis not present

## 2024-03-28 DIAGNOSIS — Z7689 Persons encountering health services in other specified circumstances: Secondary | ICD-10-CM

## 2024-03-28 MED ORDER — VERAPAMIL HCL ER 120 MG PO TBCR: 120.0000 mg | EXTENDED_RELEASE_TABLET | Freq: Every day | ORAL | 1 refills | Status: AC

## 2024-03-28 NOTE — Progress Notes (Unsigned)
 Established Patient Office Visit  Subjective   Patient ID: Omar [name], male    DOB: Mar 01, 1946  Age: 78 y.o. 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) 03/11/2024   Urinary retention 03/11/2024   Benign prostatic hyperplasia with incomplete bladder emptying 03/06/2024   Choledocholithiasis 03/01/2024   Cholangitis 02/29/2024   Septic shock (HCC) 02/28/2024   Bacteremia due to Enterococcus 02/28/2024   Bacteremia due to Klebsiella pneumoniae 02/28/2024   Bacteremia 02/28/2024   RUQ pain 02/28/2024   Elevated LFTs 02/28/2024   Gallstones 02/28/2024   Acute biliary pancreatitis 02/28/2024   Mass of right lower leg 03/08/2023   Hematoma 02/28/2023   Encounter for routine adult health examination with abnormal findings 11/09/2022   Dupuytren's contracture of right hand 09/29/2022   Right leg swelling 06/22/2022   Preventative health care 06/22/2022   Hypotensive episode 05/19/2022    Class: Acute   Hyponatremia 05/19/2022    Class: Acute   Testosterone  deficiency in male 01/27/2022   Insomnia 11/01/2021   Primary localized osteoarthritis of right hip 06/24/2019   S/P total hip arthroplasty 06/24/2019   Positive colorectal cancer screening using Cologuard test 01/23/2019   Morbid obesity (HCC) 11/16/2017   Heterozygous factor V Leiden mutation (HCC) 01/16/2017   Chronic anticoagulation 01/16/2017   Vitamin D  deficiency 01/21/2016   DVT of leg (deep venous thrombosis) (HCC) 07/02/2015   CAD (coronary artery disease), native coronary artery 06/05/2012   Postsurgical percutaneous transluminal coronary angioplasty (PTCA) status 06/05/2012   Essential hypertension    Type 2 diabetes mellitus with vascular disease (HCC)    Sleep apnea    Meningioma (HCC)     Mixed hyperlipidemia    Hepatic steatosis    Abnormal EKG       ROS    Objective:     BP 96/64   Pulse 62   Ht 5' 8 (1.727 m)   Wt 235 lb (106.6 kg)   SpO2 96%   BMI 35.73 kg/m  BP Readings from Last 3 Encounters:  03/28/24 96/64  03/11/24 112/71  03/05/24 (!) 143/80   Wt Readings from Last 3 Encounters:  03/28/24 235 lb (106.6 kg)  03/11/24 235 lb (106.6 kg)  03/05/24 217 lb (98.4 kg)     Physical Exam Vitals and nursing note reviewed. Exam conducted with a chaperone present (wife is with patient).  Constitutional:      Appearance: He is obese.   Eyes:     Extraocular Movements: Extraocular movements intact.     Pupils: Pupils are equal, round, and reactive to light.    Cardiovascular:     Rate and Rhythm: Normal rate and regular rhythm.  Pulmonary:     Effort: Pulmonary effort is normal.     Breath sounds: Normal breath sounds.  Abdominal:     General: A surgical scar is present. Bowel sounds are normal.     Palpations: Abdomen is soft.     Tenderness: There is no abdominal tenderness.   Musculoskeletal:     Cervical back: Normal range of motion and neck supple.   Neurological:     Mental Status: He is alert and oriented to person, place, and time.   Psychiatric:        Mood and Affect:  Mood normal.        Thought Content: Thought content normal.      No results found for any visits on 03/28/24.  Last CBC Lab Results  Component Value Date   WBC 8.0 03/24/2024   HGB 13.2 03/24/2024   HCT 41.9 03/24/2024   MCV 94 03/24/2024   MCH 29.5 03/24/2024   RDW 14.5 03/24/2024   PLT 282 03/24/2024   Last metabolic panel Lab Results  Component Value Date   GLUCOSE 169 (H) 03/24/2024   NA 137 03/24/2024   K 4.8 03/24/2024   CL 102 03/24/2024   CO2 21 03/24/2024   BUN 18 03/24/2024   CREATININE 1.00 03/24/2024   EGFR 77 03/24/2024   CALCIUM  9.2 03/24/2024   PHOS 2.9 08/07/2017   PROT 5.6 (L) 03/24/2024   ALBUMIN 3.7 (L) 03/24/2024    LABGLOB 1.9 03/24/2024   AGRATIO 1.8 12/29/2022   BILITOT 0.4 03/24/2024   ALKPHOS 90 03/24/2024   AST 27 03/24/2024   ALT 29 03/24/2024   ANIONGAP 9 03/04/2024   Last lipids Lab Results  Component Value Date   CHOL 159 12/29/2022   HDL 42 12/29/2022   LDLCALC 85 12/29/2022   TRIG 187 (H) 12/29/2022   CHOLHDL 3.8 12/29/2022   Last hemoglobin A1c Lab Results  Component Value Date   HGBA1C 6.3 (H) 02/28/2024   Last thyroid  functions Lab Results  Component Value Date   TSH 2.210 12/29/2022   T4TOTAL 8.1 10/08/2019      The ASCVD Risk score (Arnett DK, et al., 2019) failed to calculate for the following reasons:   Risk score cannot be calculated because patient has a medical history suggesting prior/existing ASCVD    Assessment & Plan:   Problem List Items Addressed This Visit       Cardiovascular and Mediastinum   Essential hypertension   His blood pressure has been running low since recent cholecystectomy.  He does report weight loss over the past 6 months.  Agreed to reduce dose of verapamil  to 120 mg daily.  Advised to continue with current dose of lisinopril  5 mg daily, spironolactone  50 mg daily and acebutolol  200 mg twice daily.  Continue to monitor blood pressure at home, maintain good oral hydration, follow low-sodium diet and get regular exercise.  Aim for 150 minutes of exercise per week. Recommend follow-up in 3 months for recheck of blood pressure.      Relevant Medications   verapamil  (CALAN -SR) 120 MG CR tablet     Digestive   Choledocholithiasis - Primary   S/p ERCP and later cholecystectomy Followed by general surgery Last CMP reviewed, liver enzymes have trended [name], check CMP after 1 week      Other Visit Diagnoses       Encounter to establish care           Return in about 3 months (around 06/28/2024).    Leita Longs, FNP

## 2024-03-31 ENCOUNTER — Other Ambulatory Visit: Payer: Self-pay | Admitting: Urology

## 2024-03-31 ENCOUNTER — Ambulatory Visit (INDEPENDENT_AMBULATORY_CARE_PROVIDER_SITE_OTHER)

## 2024-03-31 DIAGNOSIS — R339 Retention of urine, unspecified: Secondary | ICD-10-CM | POA: Diagnosis not present

## 2024-03-31 DIAGNOSIS — N401 Enlarged prostate with lower urinary tract symptoms: Secondary | ICD-10-CM

## 2024-03-31 MED ORDER — CIPROFLOXACIN HCL 500 MG PO TABS
500.0000 mg | ORAL_TABLET | Freq: Once | ORAL | Status: AC
  Administered 2024-03-31: 500 mg via ORAL

## 2024-03-31 MED ORDER — SILODOSIN 8 MG PO CAPS: 8.0000 mg | ORAL_CAPSULE | Freq: Every day | ORAL | 1 refills | Status: DC

## 2024-03-31 NOTE — Assessment & Plan Note (Signed)
 His blood pressure has been running low since recent cholecystectomy.  He does report weight loss over the past 6 months.  Agreed to reduce dose of verapamil  to 120 mg daily.  Advised to continue with current dose of lisinopril  5 mg daily, spironolactone  50 mg daily and acebutolol  200 mg twice daily.  Continue to monitor blood pressure at home, maintain good oral hydration, follow low-sodium diet and get regular exercise.  Aim for 150 minutes of exercise per week. Recommend follow-up in 3 months for recheck of blood pressure.

## 2024-03-31 NOTE — Progress Notes (Signed)
 Fill and Pull Catheter Removal  Patient is present today for a catheter removal.  of sterile water  was instilled into the bladder when the patient felt the urge to urinate. 10ml of water  was then drained from the balloon.  A 16FR foley cath was removed from the bladder no complications were noted .  Foley catheter intact and time of removal. Patient as then given some time to void on their own.  Patient can void  110 ml on their own after some time.  Patient tolerated well.  One oral prophylactic antibiotic given per MD orders  Performed by: Irlanda Croghan LPN  Follow up/ Additional notes: Patient will return this afternoon for pvr   post void residual= 259  Per NP she would like to have foley catheter replaced, for patient to start silodosin and repeat voiding trial. Patient refused foley catheter and has full understanding that he may end up in the ER. Patient states he is willing to take that risk. Appointment made for 1 month f/u with pvr.

## 2024-03-31 NOTE — Assessment & Plan Note (Signed)
 S/p ERCP and later cholecystectomy Followed by general surgery Last CMP reviewed, liver enzymes have trended down, check CMP after 1 week

## 2024-04-07 ENCOUNTER — Ambulatory Visit

## 2024-04-07 ENCOUNTER — Encounter: Payer: Self-pay | Admitting: Urology

## 2024-04-07 ENCOUNTER — Ambulatory Visit (INDEPENDENT_AMBULATORY_CARE_PROVIDER_SITE_OTHER): Admitting: Urology

## 2024-04-07 VITALS — BP 117/78 | HR 61 | Temp 98.5°F

## 2024-04-07 DIAGNOSIS — N401 Enlarged prostate with lower urinary tract symptoms: Secondary | ICD-10-CM | POA: Diagnosis not present

## 2024-04-07 DIAGNOSIS — R339 Retention of urine, unspecified: Secondary | ICD-10-CM

## 2024-04-07 DIAGNOSIS — E291 Testicular hypofunction: Secondary | ICD-10-CM | POA: Diagnosis not present

## 2024-04-07 DIAGNOSIS — R338 Other retention of urine: Secondary | ICD-10-CM

## 2024-04-07 LAB — URINALYSIS, ROUTINE W REFLEX MICROSCOPIC
Bilirubin, UA: NEGATIVE
Ketones, UA: NEGATIVE
Leukocytes,UA: NEGATIVE
Nitrite, UA: NEGATIVE
Protein,UA: NEGATIVE
RBC, UA: NEGATIVE
Specific Gravity, UA: 1.02 (ref 1.005–1.030)
Urobilinogen, Ur: 0.2 mg/dL (ref 0.2–1.0)
pH, UA: 6 (ref 5.0–7.5)

## 2024-04-07 MED ORDER — CIPROFLOXACIN HCL 500 MG PO TABS
500.0000 mg | ORAL_TABLET | Freq: Once | ORAL | Status: AC
  Administered 2024-04-07: 500 mg via ORAL

## 2024-04-07 MED ORDER — TESTOSTERONE CYPIONATE 200 MG/ML IM SOLN: 200.0000 mg | Freq: Once | INTRAMUSCULAR | Status: DC

## 2024-04-07 NOTE — Progress Notes (Signed)
 Patient receiving IM injection per MD order  The injection site was cleaned and prepped with alcohol. A band aid applied after injection given.   IM injection  Medication: TESTOSTERONE  Dose: 0.5ML Location: right BUTTOCKS Lot: [id number] Exp: 05/02/2025  Patient tolerated well, no complications were noted  Performed by: Gregory Barrick LPN

## 2024-04-07 NOTE — Progress Notes (Addendum)
 Bladder Scan completed today.  Patient can void prior to the bladder scan. Bladder scan result: [id number]  Performed By: [facility] LPN   Patient receiving IM injection per MD order   The injection site was cleaned and prepped with alcohol. A band aid applied after injection given.    IM injection   Medication: TESTOSTERONE  Dose: 0.5ML Location: right BUTTOCKS Lot: [id number] Exp: 05/02/2025   Patient tolerated well, no complications were noted   Performed by: Oaklee Esther LPN

## 2024-04-07 NOTE — H&P (View-Only) (Signed)
 History of Present Illness: This man comes in today for follow-up.  Previously, he was treated for hypogonadism.  More recently, he developed urinary retention during hospitalization for cholecystitis.  He subsequently has had a catheter.  At his visit on the 30th of last month, he failed voiding trial, but did not want catheter replaced.  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 Milton interpreted the study-CPAP recommended, but refused by patient   Stroke [facility])    no deficits from stroke, Didnt know I had one when they say I did.    Past Surgical History:  Procedure Laterality Date   BIOPSY  04/16/2019   Procedure: BIOPSY;  Surgeon: Harvey Margo CROME, MD;  Location: [facility];  Service: Endoscopy;;   CATARACT EXTRACTION W/PHACO Left 04/03/2016   Procedure: CATARACT EXTRACTION PHACO AND INTRAOCULAR LENS PLACEMENT LEFT EYE CDE=21.76;  Surgeon: Dow JULIANNA Burke, MD;  Location: [facility];  Service: Ophthalmology;  Laterality: Left;   CATARACT EXTRACTION W/PHACO Right 06/19/2016   Procedure: CATARACT EXTRACTION PHACO AND INTRAOCULAR LENS PLACEMENT; CDE:  11.56;  Surgeon: Dow JULIANNA Burke, MD;  Location: [facility];  Service: Ophthalmology;  Laterality: Right;    CHOLECYSTECTOMY N/A 03/03/2024   Procedure: LAPAROSCOPIC CHOLECYSTECTOMY;  Surgeon: Rubin Calamity, MD;  Location[facility];  Service: General;  Laterality: N/A;  with indocyanine green    COLONOSCOPY N/A 04/16/2019   Procedure: COLONOSCOPY;  Surgeon: Harvey Margo CROME, MD;  [facility]: [facility];  Service: Endoscopy;  Laterality: N/A;  8:30am   COLONOSCOPY W/ POLYPECTOMY  05/2008   polypectomy x4-adenomatous; internal hemorrhoids   CORONARY ANGIOPLASTY WITH STENT PLACEMENT  06/04/2012     DES    to [place]   CORONARY STENT PLACEMENT     x 1   DUPUYTREN / PALMAR FASCIOTOMY  2009   rt hand-dsc-went home same day x2   ERCP N/A 02/29/2024   Procedure: ERCP, WITH INTERVENTION IF INDICATED;  Surgeon: Charlanne Groom, MD;  Location: [facility] ENDOSCOPY;  Service: Gastroenterology;  Laterality: N/A;   ESOPHAGOGASTRODUODENOSCOPY N/A 04/16/2019   Procedure: ESOPHAGOGASTRODUODENOSCOPY (EGD);  Surgeon: Harvey Margo CROME, MD;  [facility];  Service: Endoscopy;  Laterality: N/A;   LEFT HEART CATHETERIZATION WITH CORONARY ANGIOGRAM N/A 05/28/2012   Procedure: LEFT HEART CATHETERIZATION WITH CORONARY ANGIOGRAM;  Surgeon: Erick JONELLE Bergamo, MD;  Location: [facility] CATH LAB;  Service: Cardiovascular;  Laterality: N/A;   PERCUTANEOUS CORONARY STENT INTERVENTION (PCI-S) N/A 06/04/2012   Procedure: PERCUTANEOUS CORONARY STENT INTERVENTION (PCI-S);  Surgeon: Erick JONELLE Bergamo, MD;  Location: [facility] CATH LAB;  Service: Cardiovascular;  Laterality: N/A;   POLYPECTOMY  04/16/2019   Procedure:  POLYPECTOMY;  Surgeon: Harvey Margo CROME, MD;  [facility]: [facility];  Service: Endoscopy;;   TONSILLECTOMY     TOTAL HIP ARTHROPLASTY Right 06/24/2019   Procedure: TOTAL HIP ARTHROPLASTY;  Surgeon: Josefina Chew, MD;  Location: [facility];  Service: Orthopedics;  Laterality: Right;   VENTRAL HERNIA REPAIR  2010   umb hernia-[facility]-went home same day    Home Medications:  Allergies as of 04/08/2024       Reactions   Empagliflozin          Medication  List        Accurate as of April 07, 2024 12:26 PM. If you have any questions, ask your nurse or doctor.          acebutolol  200 MG capsule Commonly known as: SECTRAL  Take 1 capsule (200 mg total) by mouth 2 (two) times daily.   acetaminophen  500 MG tablet Commonly known as: TYLENOL  Take 1,000 mg by mouth every 6 (six) hours as needed.   apixaban  5 MG Tabs tablet Commonly known as: ELIQUIS  Take 5 mg by mouth 2 (two) times daily.   HYDROcodone-acetaminophen  5-325 MG tablet Commonly known as: NORCO/VICODIN Take by mouth.   lisinopril  5 MG tablet Commonly known as: ZESTRIL  TAKE (1) TABLET BY MOUTH ONCE DAILY.   metFORMIN  500 MG tablet Commonly known as: GLUCOPHAGE  Take 1 tablet (500 mg total) by mouth 2 (two) times daily with a meal.   nitroGLYCERIN  0.4 MG SL tablet Commonly known as: NITROSTAT  PLACE 1 TAB UNDER TONGUE EVERY 5 MIN IF NEEDED FOR CHEST PAIN. MAY USE 3 TIMES.NO RELIEF CALL 911.   ONE-A-DAY 50 PLUS PO Take 1 tablet by mouth at bedtime.   rosuvastatin  40 MG tablet Commonly known as: CRESTOR  Take 1 tablet (40 mg total) by mouth daily. What changed: when to take this   Semaglutide(0.25 or 0.5MG /DOS) 2 MG/3ML Sopn Inject 2 mg into the skin once a week. Inject 2mg  into skin on Sunday   silodosin  8 MG Caps capsule Commonly known as: RAPAFLO  Take 1 capsule (8 mg total) by mouth daily with breakfast.   spironolactone  50 MG tablet Commonly known as: ALDACTONE  TAKE 1 TABLET BY MOUTH EVERY MORNING   torsemide  20 MG tablet Commonly known as: DEMADEX  Take 1 tablet (20 mg total) by mouth daily.   verapamil  120 MG CR tablet Commonly known as: CALAN -SR Take 1 tablet (120 mg total) by mouth daily with breakfast.        Allergies:  Allergies  Allergen Reactions   Empagliflozin      Family History  Problem Relation Age of Onset   Cancer Mother    Brain cancer Mother    Cancer Father    Prostate cancer Father    Breast cancer Daughter    Thyroid   cancer Daughter    Coronary artery disease Neg Hx    Colon cancer Neg Hx     Social History:  reports that he has never smoked. He has never been exposed to tobacco smoke. He has never used smokeless tobacco. He reports that he does not drink alcohol and does not use drugs.  ROS: A complete review of systems was performed.  All systems are negative except for pertinent findings as noted.  Physical Exam:  Vital signs in last 24 hours: There were no vitals taken for this visit. Constitutional:  Alert and oriented, No acute distress Cardiovascular: Regular rate  Respiratory: Normal respiratory effort GI: Abdomen is soft, nontender, nondistended, no abdominal masses. No CVAT.  Genitourinary: Normal  male phallus, testes are descended bilaterally and non-tender and without masses, scrotum is normal in appearance without lesions or masses, perineum is normal on inspection. Lymphatic: No lymphadenopathy Neurologic: Grossly intact, no focal deficits Psychiatric: Normal mood and affect  I have reviewed prior pt notes  I have reviewed notes from referring/previous physicians  I have reviewed urinalysis results  I have independently reviewed prior imaging  I have reviewed prior PSA results  I have reviewed prior urine culture   Impression/Assessment:  ***  Plan:  ***

## 2024-04-07 NOTE — Progress Notes (Signed)
 Simple Catheter Placement  Due to urinary retention patient is present today for a foley cath placement.  Patient was cleaned and prepped in a sterile fashion with Betadinex3 . A 16 FR foley catheter was inserted, urine return was noted  , urine was Dark yellow in color.  The balloon was filled with 10cc of sterile water .  A leg bag was attached for drainage. Patient was given instruction on proper catheter care.  Patient tolerated well, no complications were noted   Performed by: Exie DASEN. CMA  Additional notes/ Follow up: as scheduled

## 2024-04-07 NOTE — Progress Notes (Signed)
 History of Present Illness: This man comes in today for follow-up.  Previously, he was treated for hypogonadism.  More recently, he developed urinary retention during hospitalization for cholecystitis.  He subsequently has had a catheter.  At his visit on the 30th of last month, he failed voiding trial, but did not want catheter replaced.  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 Milton interpreted the study-CPAP recommended, but refused by patient   Stroke [facility])    no deficits from stroke, Didnt know I had one when they say I did.    Past Surgical History:  Procedure Laterality Date   BIOPSY  04/16/2019   Procedure: BIOPSY;  Surgeon: Harvey Margo CROME, MD;  [facility];  Service: Endoscopy;;   CATARACT EXTRACTION W/PHACO Left 04/03/2016   Procedure: CATARACT EXTRACTION PHACO AND INTRAOCULAR LENS PLACEMENT LEFT EYE CDE=21.76;  Surgeon: Dow JULIANNA Burke, MD;  Location: [facility];  Service: Ophthalmology;  Laterality: Left;   CATARACT EXTRACTION W/PHACO Right 06/19/2016   Procedure: CATARACT EXTRACTION PHACO AND INTRAOCULAR LENS PLACEMENT; CDE:  11.56;  Surgeon: Dow JULIANNA Burke, MD;  Location: [facility];  Service: Ophthalmology;  Laterality: Right;    CHOLECYSTECTOMY N/A 03/03/2024   Procedure: LAPAROSCOPIC CHOLECYSTECTOMY;  Surgeon: Rubin Calamity, MD;  Location: [facility] OR;  Service: General;  Laterality: N/A;  with indocyanine green    COLONOSCOPY N/A 04/16/2019   Procedure: COLONOSCOPY;  Surgeon: Harvey Margo CROME, MD;  Location: [facility];  Service: Endoscopy;  Laterality: N/A;  8:30am   COLONOSCOPY W/ POLYPECTOMY  05/2008   polypectomy x4-adenomatous; internal hemorrhoids   CORONARY ANGIOPLASTY WITH STENT PLACEMENT  06/04/2012     DES    to [place]   CORONARY STENT PLACEMENT     x 1   DUPUYTREN / PALMAR FASCIOTOMY  2009   rt hand-dsc-went home same day x2   ERCP N/A 02/29/2024   Procedure: ERCP, WITH INTERVENTION IF INDICATED;  Surgeon: Charlanne Groom, MD;  Location: [facility] ENDOSCOPY;  Service: Gastroenterology;  Laterality: N/A;   ESOPHAGOGASTRODUODENOSCOPY N/A 04/16/2019   Procedure: ESOPHAGOGASTRODUODENOSCOPY (EGD);  Surgeon: Harvey Margo CROME, MD;  [facility];  Service: Endoscopy;  Laterality: N/A;   LEFT HEART CATHETERIZATION WITH CORONARY ANGIOGRAM N/A 05/28/2012   Procedure: LEFT HEART CATHETERIZATION WITH CORONARY ANGIOGRAM;  Surgeon: Erick JONELLE Bergamo, MD;  Location: [facility] CATH LAB;  Service: Cardiovascular;  Laterality: N/A;   PERCUTANEOUS CORONARY STENT INTERVENTION (PCI-S) N/A 06/04/2012   Procedure: PERCUTANEOUS CORONARY STENT INTERVENTION (PCI-S);  Surgeon: Erick JONELLE Bergamo, MD;  Location: [facility] CATH LAB;  Service: Cardiovascular;  Laterality: N/A;   POLYPECTOMY  04/16/2019   Procedure:  POLYPECTOMY;  Surgeon: Harvey Margo CROME, MD;  Location: [facility];  Service: Endoscopy;;   TONSILLECTOMY     TOTAL HIP ARTHROPLASTY Right 06/24/2019   Procedure: TOTAL HIP ARTHROPLASTY;  Surgeon: Josefina Chew, MD;  Location: [facility];  Service: Orthopedics;  Laterality: Right;   VENTRAL HERNIA REPAIR  2010   umb hernia-[facility]-went home same day    Home Medications:  Allergies as of 04/08/2024       Reactions   Empagliflozin          Medication  List        Accurate as of April 07, 2024 12:26 PM. If you have any questions, ask your nurse or doctor.          acebutolol  200 MG capsule Commonly known as: SECTRAL  Take 1 capsule (200 mg total) by mouth 2 (two) times daily.   acetaminophen  500 MG tablet Commonly known as: TYLENOL  Take 1,000 mg by mouth every 6 (six) hours as needed.   apixaban  5 MG Tabs tablet Commonly known as: ELIQUIS  Take 5 mg by mouth 2 (two) times daily.   HYDROcodone-acetaminophen  5-325 MG tablet Commonly known as: NORCO/VICODIN Take by mouth.   lisinopril  5 MG tablet Commonly known as: ZESTRIL  TAKE (1) TABLET BY MOUTH ONCE DAILY.   metFORMIN  500 MG tablet Commonly known as: GLUCOPHAGE  Take 1 tablet (500 mg total) by mouth 2 (two) times daily with a meal.   nitroGLYCERIN  0.4 MG SL tablet Commonly known as: NITROSTAT  PLACE 1 TAB UNDER TONGUE EVERY 5 MIN IF NEEDED FOR CHEST PAIN. MAY USE 3 TIMES.NO RELIEF CALL 911.   ONE-A-DAY 50 PLUS PO Take 1 tablet by mouth at bedtime.   rosuvastatin  40 MG tablet Commonly known as: CRESTOR  Take 1 tablet (40 mg total) by mouth daily. What changed: when to take this   Semaglutide(0.25 or 0.5MG /DOS) 2 MG/3ML Sopn Inject 2 mg into the skin once a week. Inject 2mg  into skin on Sunday   silodosin  8 MG Caps capsule Commonly known as: RAPAFLO  Take 1 capsule (8 mg total) by mouth daily with breakfast.   spironolactone  50 MG tablet Commonly known as: ALDACTONE  TAKE 1 TABLET BY MOUTH EVERY MORNING   torsemide  20 MG tablet Commonly known as: DEMADEX  Take 1 tablet (20 mg total) by mouth daily.   verapamil  120 MG CR tablet Commonly known as: CALAN -SR Take 1 tablet (120 mg total) by mouth daily with breakfast.        Allergies:  Allergies  Allergen Reactions   Empagliflozin      Family History  Problem Relation Age of Onset   Cancer Mother    Brain cancer Mother    Cancer Father    Prostate cancer Father    Breast cancer Daughter    Thyroid   cancer Daughter    Coronary artery disease Neg Hx    Colon cancer Neg Hx     Social History:  reports that he has never smoked. He has never been exposed to tobacco smoke. He has never used smokeless tobacco. He reports that he does not drink alcohol and does not use drugs.  ROS: A complete review of systems was performed.  All systems are negative except for pertinent findings as noted.  Physical Exam:  Vital signs in last 24 hours: There were no vitals taken for this visit. Constitutional:  Alert and oriented, No acute distress Cardiovascular: Regular rate  Respiratory: Normal respiratory effort GI: Abdomen is soft, nontender, nondistended, no abdominal masses. No CVAT.  Genitourinary: Normal  male phallus, testes are descended bilaterally and non-tender and without masses, scrotum is normal in appearance without lesions or masses, perineum is normal on inspection. Lymphatic: No lymphadenopathy Neurologic: Grossly intact, no focal deficits Psychiatric: Normal mood and affect  I have reviewed prior pt notes  I have reviewed notes from referring/previous physicians  I have reviewed urinalysis results  I have independently reviewed prior imaging  I have reviewed prior PSA results  I have reviewed prior urine culture   Impression/Assessment:  ***  Plan:  ***

## 2024-04-07 NOTE — Progress Notes (Signed)
 Name: Omar [name] DOB: 14-Jun-1946 MRN: [id number]  History of Present Illness: Omar [name] is a 78 y.o. male who presents today for follow up visit at [facility].  Relevant History includes: 1. BPH. - Family history of prostate cancer (father). - 10/23/2023: PSA was normal (1.5). 2. 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 3. Erectile dysfunction. 4. Kidney stone. - 02/27/2024: CT showed a non-obstructing 5 mm right renal stone.  At last visit with Dr. Matilda on 10/23/2023: Doing well.  Since last visit: > 02/27/2024 - 03/04/2024: Admitted for septic shock secondary to choledocholithiasis and pancreatitis with likely cholecystitis. Underwent ERCP on 02/29/2024 and laparoscopic cholecystectomy on 03/03/2024.  > 03/05/2024: Seen in ER for urinary retention. Foley inserted with 1400 cc urine into bag. Urine microscopy: 11-20 RBC/hpf, otherwise unremarkable. Urine culture negative. Foley catheter placed.  > 03/31/2024: Failed voiding trial at nurse visit. Patient refused catheter replacement. Switched from Flomax  to Rapaflo .  Today: He reports urinary urgency, frequency, nocturia (once an hour), weak urinary stream, dribbling, hesitancy, and straining to void. Denies dysuria, gross hematuria, sensations of incomplete emptying.  He is concerned about his testosterone  level. Despite getting testosterone  replacement therapy his testosterone  was low (200) on 10/23/2023.   Medications: Current Outpatient Medications  Medication Sig Dispense Refill   acebutolol  (SECTRAL ) 200 MG capsule Take 1 capsule (200 mg total) by mouth 2 (two) times daily. 180 capsule 3   acetaminophen  (TYLENOL ) 500 MG tablet Take 1,000 mg by mouth every 6 (six) hours as needed.     apixaban  (ELIQUIS ) 5 MG TABS tablet Take 5 mg by mouth 2 (two) times daily.     HYDROcodone-acetaminophen  (NORCO/VICODIN) 5-325 MG tablet Take by mouth.     lisinopril  (ZESTRIL ) 5 MG tablet TAKE (1) TABLET BY MOUTH ONCE DAILY.  90 tablet 3   metFORMIN  (GLUCOPHAGE ) 500 MG tablet Take 1 tablet (500 mg total) by mouth 2 (two) times daily with a meal. 180 tablet 3   Multiple Vitamins-Minerals (ONE-A-DAY 50 PLUS PO) Take 1 tablet by mouth at bedtime.      nitroGLYCERIN  (NITROSTAT ) 0.4 MG SL tablet PLACE 1 TAB UNDER TONGUE EVERY 5 MIN IF NEEDED FOR CHEST PAIN. MAY USE 3 TIMES.NO RELIEF CALL 911. 25 tablet 3   rosuvastatin  (CRESTOR ) 40 MG tablet Take 1 tablet (40 mg total) by mouth daily. (Patient taking differently: Take 40 mg by mouth at bedtime.) 90 tablet 3   Semaglutide,0.25 or 0.5MG /DOS, 2 MG/3ML SOPN Inject 2 mg into the skin once a week. Inject 2mg  into skin on Sunday (Patient not taking: Reported on 04/07/2024)     silodosin  (RAPAFLO ) 8 MG CAPS capsule Take 1 capsule (8 mg total) by mouth daily with breakfast. 30 capsule 1   spironolactone  (ALDACTONE ) 50 MG tablet TAKE 1 TABLET BY MOUTH EVERY MORNING 90 tablet 1   torsemide  (DEMADEX ) 20 MG tablet Take 1 tablet (20 mg total) by mouth daily. 30 tablet 1   verapamil  (CALAN -SR) 120 MG CR tablet Take 1 tablet (120 mg total) by mouth daily with breakfast. 90 tablet 1   Current Facility-Administered Medications  Medication Dose Route Frequency Provider Last Rate Last Admin   testosterone  cypionate (DEPOTESTOSTERONE CYPIONATE) injection 200 mg  200 mg Intramuscular Weekly Wrenn, John, MD   200 mg at 04/07/24 9157    Allergies: Allergies  Allergen Reactions   Empagliflozin      Past Medical History:  Diagnosis Date   Abnormal EKG    Inferior Q waves   BPH (benign prostatic  hyperplasia)    Chronic anticoagulation 01/16/2017   Coronary artery disease    Degenerative joint disease    Diabetes mellitus, type 2 (HCC)    No insulin    DVT (deep venous thrombosis) (HCC) 4 -5 yrs ago   DVT of leg (deep venous thrombosis) (HCC) 07/02/2015   Heart murmur    Hepatic steatosis    Heterozygous factor V Leiden mutation (HCC) 01/16/2017   Hyperlipidemia    Hypertension    Echo in  2008-technically limited, mild LVH, normal EF; 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 Milton interpreted the study-CPAP recommended, but refused by patient   Stroke [facility])    no deficits from stroke, Didnt know I had one when they say I did.   Past Surgical History:  Procedure Laterality Date   BIOPSY  04/16/2019   Procedure: BIOPSY;  Surgeon: Harvey Margo CROME, MD;  Location: [facility];  Service: Endoscopy;;   CATARACT EXTRACTION W/PHACO Left 04/03/2016   Procedure: CATARACT EXTRACTION PHACO AND INTRAOCULAR LENS PLACEMENT LEFT EYE CDE=21.76;  Surgeon: Dow JULIANNA Burke, MD;  Location: [facility] ORS;  Service: Ophthalmology;  Laterality: Left;   CATARACT EXTRACTION W/PHACO Right 06/19/2016   Procedure: CATARACT EXTRACTION PHACO AND INTRAOCULAR LENS PLACEMENT; CDE:  11.56;  Surgeon: Dow JULIANNA Burke, MD;  Location: [facility];  Service: Ophthalmology;  Laterality: Right;   CHOLECYSTECTOMY N/A 03/03/2024   Procedure: LAPAROSCOPIC CHOLECYSTECTOMY;  Surgeon: Rubin Calamity, MD;  Location: [facility] OR;  Service: General;  Laterality: N/A;  with indocyanine green    COLONOSCOPY N/A 04/16/2019   Procedure: COLONOSCOPY;  Surgeon: Harvey Margo CROME, MD;  Location: [facility];  Service: Endoscopy;  Laterality: N/A;  8:30am   COLONOSCOPY W/ POLYPECTOMY  05/2008   polypectomy x4-adenomatous; internal hemorrhoids   CORONARY ANGIOPLASTY WITH STENT PLACEMENT  06/04/2012     DES    to RI   CORONARY STENT PLACEMENT     x 1   DUPUYTREN / PALMAR FASCIOTOMY  2009   rt hand-dsc-went home same day x2   ERCP N/A 02/29/2024   Procedure: ERCP, WITH INTERVENTION IF INDICATED;  Surgeon: Charlanne Groom, MD;  Location: [facility] ENDOSCOPY;  Service: Gastroenterology;  Laterality: N/A;   ESOPHAGOGASTRODUODENOSCOPY N/A 04/16/2019   Procedure:  ESOPHAGOGASTRODUODENOSCOPY (EGD);  Surgeon: Harvey Margo CROME, MD;  [facility];  Service: Endoscopy;  Laterality: N/A;   LEFT HEART CATHETERIZATION WITH CORONARY ANGIOGRAM N/A 05/28/2012   Procedure: LEFT HEART CATHETERIZATION WITH CORONARY ANGIOGRAM;  Surgeon: Erick JONELLE Bergamo, MD;  Location: [facility] CATH LAB;  Service: Cardiovascular;  Laterality: N/A;   PERCUTANEOUS CORONARY STENT INTERVENTION (PCI-S) N/A 06/04/2012   Procedure: PERCUTANEOUS CORONARY STENT INTERVENTION (PCI-S);  Surgeon: Erick JONELLE Bergamo, MD;  Location: [facility] CATH LAB;  Service: Cardiovascular;  Laterality: N/A;   POLYPECTOMY  04/16/2019   Procedure: POLYPECTOMY;  Surgeon: Harvey Margo CROME, MD;  Location: [facility];  Service: Endoscopy;;   TONSILLECTOMY     TOTAL HIP ARTHROPLASTY Right 06/24/2019   Procedure: TOTAL HIP ARTHROPLASTY;  Surgeon: Josefina Chew, MD;  Location: [facility] ORS;  Service: Orthopedics;  Laterality: Right;   VENTRAL HERNIA REPAIR  2010   umb hernia-[facility]-went home same day   Family History  Problem Relation Age of Onset   Cancer Mother  Brain cancer Mother    Cancer Father    Prostate cancer Father    Breast cancer Daughter    Thyroid  cancer Daughter    Coronary artery disease Neg Hx    Colon cancer Neg Hx    Social History   Socioeconomic History   Marital status: Married    Spouse name: Not on file   Number of children: 2   Years of education: Not on file   Highest education level: Not on file  Occupational History   Occupation: Airline pilot    Comment: Doctor, general practice  Tobacco Use   Smoking status: Never    Passive exposure: Never   Smokeless tobacco: Never  Vaping Use   Vaping status: Never Used  Substance and Sexual Activity   Alcohol use: No   Drug use: No   Sexual activity: Not Currently    Birth control/protection: None  Other Topics Concern   Not on file  Social History Narrative   Married for 54 yrs,lives with wife.Works  part time at [employer].    Social Drivers of Corporate investment banker Strain: Not on file  Food Insecurity: No Food Insecurity (02/29/2024)   Hunger Vital Sign    Worried About Running Out of Food in the Last Year: Never true    Ran Out of Food in the Last Year: Never true  Transportation Needs: No Transportation Needs (02/29/2024)   PRAPARE - Administrator, Civil Service (Medical): No    Lack of Transportation (Non-Medical): No  Physical Activity: Not on file  Stress: Not on file  Social Connections: Socially Integrated (02/29/2024)   Social Connection and Isolation Panel    Frequency of Communication with Friends and Family: More than three times a week    Frequency of Social Gatherings with Friends and Family: Twice a week    Attends Religious Services: 1 to 4 times per year    Active Member of [employer] or Organizations: Yes    Attends Banker Meetings: 1 to 4 times per year    Marital Status: Married  Catering manager Violence: Not At Risk (02/29/2024)   Humiliation, Afraid, Rape, and Kick questionnaire    Fear of Current or Ex-Partner: No    Emotionally Abused: No    Physically Abused: No    Sexually Abused: No    Review of Systems Constitutional: Patient denies any unintentional weight loss or change in strength lntegumentary: Patient denies any rashes or pruritus Cardiovascular: Patient denies chest pain or syncope Respiratory: Patient denies shortness of breath Gastrointestinal: Patient denies nausea, vomiting, constipation, or diarrhea  Musculoskeletal: Patient denies muscle cramps or weakness Neurologic: Patient denies convulsions or seizures Allergic/Immunologic: Patient denies recent allergic reaction(s) Hematologic/Lymphatic: Patient denies bleeding tendencies Endocrine: Patient denies heat/cold intolerance  GU: As per HPI.  OBJECTIVE Vitals:   04/07/24 0855  BP: 117/78  Pulse: 61  Temp: 98.5 F (36.9 C)   There is no height or weight on file to calculate  BMI.  Physical Examination Constitutional: No obvious distress; patient is non-toxic appearing  Cardiovascular: No visible lower extremity edema.  Respiratory: The patient does not have audible wheezing/stridor; respirations do not appear labored  Gastrointestinal: Abdomen non-distended Musculoskeletal: Normal ROM of UEs  Skin: No obvious rashes/open sores  Neurologic: CN 2-12 grossly intact Psychiatric: Answered questions appropriately with normal affect  Hematologic/Lymphatic/Immunologic: No obvious bruises or sites of spontaneous bleeding  UA: Negative aside from 1+ glucosuria  PVR: 438 ml  ASSESSMENT  Incomplete bladder emptying - Plan: Urinalysis, Routine w reflex microscopic, BLADDER SCAN AMB NON-IMAGING, INSERT,TEMP INDWELLING BLAD CATH,SIMPLE, ciprofloxacin  (CIPRO ) tablet 500 mg  Benign prostatic hyperplasia with incomplete bladder emptying  Testosterone  deficiency in male  1. BPH with incomplete bladder emptying:  We discussed possible etiologies for incomplete bladder emptying such as: neurogenic bladder, constipation, anticholinergic medication use, high-tone pelvic floor dysfunction, voiding dyssynergia. Reviewed risks related to incomplete bladder emptying including but not limited to: bladder discomfort I pain, UTls, pyelonephritis, urosepsis, kidney damage/failure, decreased kidney function requiring dialysis, early mortality.  Discussed recommendation for catheter use at this time. He declined clean intermittent catheterization (CIC) and elected indwelling Foley catheter placement.  Advised further evaluation with  cystoscopy to assess for outlet obstruction, bladder abnormalities, etc. May need urodynamic testing to assess bladder function; discussed that he has risk factors for neurogenic bladder including T2DM.   2. Hypogonadism. He received testosterone  IM 200 mg today. Will recheck testosterone  in 1 week before 10 am.   Patient and his wife verbalized  understanding of and agreement with current plan. All questions were answered.  PLAN Advised the following: 1. Foley catheter placed. 2. Continue Rapaflo  8 mg daily.  3. Lab visit for testosterone  check in 1 week before 10 am. 4. Return for 1st available cystoscopy with any urology MD.  Orders Placed This Encounter  Procedures   Urinalysis, Routine w reflex microscopic   INSERT,TEMP INDWELLING BLAD CATH,SIMPLE   BLADDER SCAN AMB NON-IMAGING   Total time spent caring for the patient today was over 30 minutes. This includes time spent on the date of the visit reviewing the patient's chart before the visit, time spent during the visit, and time spent after the visit on documentation. Over 50% of that time was spent in face-to-face time with this patient for direct counseling. E&M based on time and complexity of medical decision making.  It has been explained that the patient is to follow regularly with their PCP in addition to all other providers involved in their care and to follow instructions provided by these respective offices. Patient advised to contact urology clinic if any urologic-pertaining questions, concerns, new symptoms or problems arise in the interim period.  There are no Patient Instructions on file for this visit.  Electronically signed by:  Lauraine JAYSON Oz, FNP   04/07/24    10:23 AM

## 2024-04-08 ENCOUNTER — Ambulatory Visit (INDEPENDENT_AMBULATORY_CARE_PROVIDER_SITE_OTHER): Admitting: Urology

## 2024-04-08 VITALS — BP 109/69 | HR 70

## 2024-04-08 DIAGNOSIS — R338 Other retention of urine: Secondary | ICD-10-CM

## 2024-04-08 DIAGNOSIS — E291 Testicular hypofunction: Secondary | ICD-10-CM

## 2024-04-08 DIAGNOSIS — Z978 Presence of other specified devices: Secondary | ICD-10-CM | POA: Diagnosis not present

## 2024-04-08 DIAGNOSIS — N401 Enlarged prostate with lower urinary tract symptoms: Secondary | ICD-10-CM | POA: Diagnosis not present

## 2024-04-08 DIAGNOSIS — R339 Retention of urine, unspecified: Secondary | ICD-10-CM

## 2024-04-08 MED ORDER — CIPROFLOXACIN HCL 500 MG PO TABS
500.0000 mg | ORAL_TABLET | Freq: Once | ORAL | Status: AC
Start: 2024-04-08 — End: 2024-04-08
  Administered 2024-04-08: 500 mg via ORAL

## 2024-04-08 NOTE — Progress Notes (Signed)
 Catheter Removal  Patient is present today for a catheter removal per MD order  10ml of water  was drained from the balloon. A 16FR foley cath was removed from the bladder, no complications were noted. Patient tolerated well.  Performed by: Exie DASEN. CMA  Simple Catheter Placement  Due to urinary retention patient is present today for a foley cath placement.  Patient was cleaned and prepped in a sterile fashion with Betadinex3 . A 16 coude foley catheter was inserted, urine return was noted  25ml, urine was Clear yellow in color.  The balloon was filled with 10cc of sterile water .  A leg bag was attached for drainage. Patient was given instruction on proper catheter care.  Patient tolerated well, no complications were noted   Performed by: Exie DASEN. CMA  Additional notes/ Follow up: as scheduled

## 2024-04-09 ENCOUNTER — Other Ambulatory Visit: Payer: Self-pay

## 2024-04-09 DIAGNOSIS — N401 Enlarged prostate with lower urinary tract symptoms: Secondary | ICD-10-CM

## 2024-04-09 LAB — TESTOSTERONE: Testosterone: 448 ng/dL (ref 264–916)

## 2024-04-10 ENCOUNTER — Telehealth: Payer: Self-pay

## 2024-04-10 DIAGNOSIS — Z955 Presence of coronary angioplasty implant and graft: Secondary | ICD-10-CM | POA: Insufficient documentation

## 2024-04-10 DIAGNOSIS — M720 Palmar fascial fibromatosis [Dupuytren]: Secondary | ICD-10-CM | POA: Insufficient documentation

## 2024-04-10 DIAGNOSIS — E039 Hypothyroidism, unspecified: Secondary | ICD-10-CM | POA: Insufficient documentation

## 2024-04-10 NOTE — Telephone Encounter (Signed)
 Patient with diagnosis of remote DVT plus Factor V Leiden on Eliquis  for anticoagulation.    Procedure: TRANSURETHRAL RESECTION OF BLADDER TUMOR   Date of procedure: 04/21/24   CrCl 90 ml/min Platelet count 282K  Patient with DVT in 2016 and Factor V Leiden. Typically recommend holding Eliquis  3 days before procedure due to bleed risk. Due to underlying coagulation disorder, will route to Dr Ladona to see if a Lovenox bridge is required. Does not appear to have been bridged before.   **This guidance is not considered finalized until pre-operative APP has relayed final recommendations.**

## 2024-04-10 NOTE — Telephone Encounter (Signed)
   Pre-operative Risk Assessment    Patient Name: LANELL DUBIE  DOB: October 25, 1945 MRN: [id number]   Date of last office visit: 12/07/23 GORDY BERGAMO, MD Date of next office visit: NONE   Request for Surgical Clearance    Procedure:  TRANSURETHRAL RESECTION OF BLADDER TUMOR  Date of Surgery:  Clearance 04/21/24                                Surgeon:  DR FLEMING Surgeon's Group or Practice Name:   [facility] [place] Phone number:  [phone] Fax number:  [phone]   Type of Clearance Requested:   - Medical  - Pharmacy:  Hold Apixaban  (Eliquis ) 2 DAYS BEFORE THE SCHEDULED PROCEDURE AND 3 DAYS POST   Type of Anesthesia:  General    Additional requests/questions:    Signed, Lucie DELENA Ku   04/10/2024, 2:09 PM

## 2024-04-11 ENCOUNTER — Telehealth: Payer: Self-pay

## 2024-04-11 ENCOUNTER — Ambulatory Visit: Payer: Self-pay | Admitting: Urology

## 2024-04-11 NOTE — Telephone Encounter (Signed)
 Preop televisit now scheduled

## 2024-04-11 NOTE — Telephone Encounter (Signed)
  Patient Consent for Virtual Visit        Omar [name] has provided verbal consent on 04/11/2024 for a virtual visit (video or telephone).   CONSENT FOR VIRTUAL VISIT FOR:  Omar [name]  By participating in this virtual visit I agree to the following:  I hereby voluntarily request, consent and authorize [facility] and its employed or contracted physicians, physician assistants, nurse practitioners or other licensed health care professionals (the Practitioner), to provide me with telemedicine health care services (the "Services) as deemed necessary by the treating Practitioner. I acknowledge and consent to receive the Services by the Practitioner via telemedicine. I understand that the telemedicine visit will involve communicating with the Practitioner through live audiovisual communication technology and the disclosure of certain medical information by electronic transmission. I acknowledge that I have been given the opportunity to request an in-person assessment or other available alternative prior to the telemedicine visit and am voluntarily participating in the telemedicine visit.  I understand that I have the right to withhold or withdraw my consent to the use of telemedicine in the course of my care at any time, without affecting my right to future care or treatment, and that the Practitioner or I may terminate the telemedicine visit at any time. I understand that I have the right to inspect all information obtained and/or recorded in the course of the telemedicine visit and may receive copies of available information for a reasonable fee.  I understand that some of the potential risks of receiving the Services via telemedicine include:  Delay or interruption in medical evaluation due to technological equipment failure or disruption; Information transmitted may not be sufficient (e.g. poor resolution of images) to allow for appropriate medical decision making by the  Practitioner; and/or  In rare instances, security protocols could fail, causing a breach of personal health information.  Furthermore, I acknowledge that it is my responsibility to provide information about my medical history, conditions and care that is complete and accurate to the best of my ability. I acknowledge that Practitioner's advice, recommendations, and/or decision may be based on factors not within their control, such as incomplete or inaccurate data provided by me or distortions of diagnostic images or specimens that may result from electronic transmissions. I understand that the practice of medicine is not an exact science and that Practitioner makes no warranties or guarantees regarding treatment outcomes. I acknowledge that a copy of this consent can be made available to me via my patient portal [facility] MyChart), or I can request a printed copy by calling the office of  [facility].    I understand that my insurance will be billed for this visit.   I have read or had this consent read to me. I understand the contents of this consent, which adequately explains the benefits and risks of the Services being provided via telemedicine.  I have been provided ample opportunity to ask questions regarding this consent and the Services and have had my questions answered to my satisfaction. I give my informed consent for the services to be provided through the use of telemedicine in my medical care

## 2024-04-11 NOTE — Telephone Encounter (Signed)
 NO overlap needed. Risk of bleed with overlap and prostate surgery is high, but to restart ASAP eliquis 

## 2024-04-11 NOTE — Telephone Encounter (Signed)
 Left message to call back to schedule a tele pre op appt.  ?

## 2024-04-11 NOTE — Telephone Encounter (Signed)
   Name: Omar [name]  DOB: [birth year]  MRN: [id number]  Primary Cardiologist: None   Preoperative team, please contact this patient and set up a phone call appointment for further preoperative risk assessment. Please obtain consent and complete medication review. Thank you for your help.  I confirm that guidance regarding antiplatelet and oral anticoagulation therapy has been completed and, if necessary, noted below.  Hold Eliquis  for 3 days prior to procedure, no bridging per Dr. Ladona, need to resume ASAP after procedure.   I also confirmed the patient resides in the state of [place] . As per [facility] Medical Board telemedicine laws, the patient must reside in the state in which the provider is licensed.   [place], [place] 04/11/2024, 11:47 AM [facility]

## 2024-04-16 ENCOUNTER — Encounter (HOSPITAL_COMMUNITY): Payer: Self-pay

## 2024-04-16 ENCOUNTER — Other Ambulatory Visit: Payer: Self-pay

## 2024-04-17 ENCOUNTER — Encounter (HOSPITAL_COMMUNITY)
Admission: RE | Admit: 2024-04-17 | Discharge: 2024-04-17 | Disposition: A | Discharge status: Home / Self Care | Source: Ambulatory Visit | Attending: Urology | Admitting: Urology

## 2024-04-17 ENCOUNTER — Ambulatory Visit: Attending: Cardiology | Admitting: Emergency Medicine

## 2024-04-17 DIAGNOSIS — Z01810 Encounter for preprocedural cardiovascular examination: Secondary | ICD-10-CM | POA: Diagnosis not present

## 2024-04-17 NOTE — Progress Notes (Signed)
 Virtual Visit via Telephone Note   Because of JB DULWORTH co-morbid illnesses, he is at least at moderate risk for complications without adequate follow up.  This format is felt to be most appropriate for this patient at this time.  Due to technical limitations with video connection (technology), today's appointment will be conducted as an audio only telehealth visit, and WAQAS BRUHL verbally agreed to proceed in this manner.   All issues noted in this document were discussed and addressed.  No physical exam could be performed with this format.  Evaluation Performed:  Preoperative cardiovascular risk assessment _____________   Date:  04/17/2024   Patient ID:  78 y.o. y/o male, DOB 05-Jun-1946, MRN [id number] Patient Location:  Home Provider location:   Office  Primary Care Provider:  Bevely Doffing, FNP Primary Cardiologist:  None  Chief Complaint / Patient Profile   78 y.o. y/o male with a h/o coronary artery disease s/p DES to ramus intermediate branch on 06/2012, type 2 diabetes, hypertension, mixed hyperlipidemia, uncontrolled diabetes mellitus, PAD, spontaneous DVT x 2 who is pending transurethral resection of bladder tumor on 04/21/2024 with [facility] health urology [place] and presents today for telephonic preoperative cardiovascular risk assessment.  History of Present Illness    DATON SZILAGYI is a 78 y.o. male who presents via audio/video conferencing for a telehealth visit today.  Pt was last seen in cardiology clinic on 12/07/2023 by Dr. Ladona.  At that time KINTA MARTIS was doing well.  The patient is now pending procedure as outlined above. Since his last visit, he denies chest pain, shortness of breath, lower extremity edema, fatigue, palpitations, melena, hematuria, hemoptysis, diaphoresis, weakness, presyncope, syncope, orthopnea, and PND.  Today patient is doing well overall.  He is without acute cardiovascular concerns or complaints at this time.  He denies any chest  pain, dyspnea, or other exertional symptoms.  He stays relatively active and continues to work part-time at a [employer], [employer].  He no longer does yard work however is able to walk up and down his steps at his home, walk a block with ease, and do mild to moderate household chores without limitation.  Overall he is able to complete greater than 4 METS.  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 Milton interpreted the study-CPAP recommended, but refused by patient   Stroke [facility])    no deficits from stroke, Didnt know I had one when they say I did.   Past Surgical History:  Procedure Laterality Date   BIOPSY  04/16/2019   Procedure: BIOPSY;  Surgeon: Harvey Margo LITTIE, MD;  Location: [facility];  Service: Endoscopy;;   CATARACT EXTRACTION W/PHACO Left 04/03/2016   Procedure: CATARACT EXTRACTION PHACO AND INTRAOCULAR LENS PLACEMENT LEFT EYE CDE=21.76;  Surgeon: Dow JULIANNA Burke, MD;  Location: [facility] ORS;  Service: Ophthalmology;  Laterality: Left;   CATARACT EXTRACTION W/PHACO Right 06/19/2016   Procedure: CATARACT EXTRACTION PHACO AND INTRAOCULAR LENS PLACEMENT; CDE:  11.56;  Surgeon: Dow JULIANNA Burke, MD;  Location: [facility] ORS;  Service:  Ophthalmology;  Laterality: Right;   CHOLECYSTECTOMY N/A 03/03/2024   Procedure: LAPAROSCOPIC CHOLECYSTECTOMY;  Surgeon: Rubin Calamity, MD;  Location: [facility] OR;  Service: General;  Laterality: N/A;  with indocyanine green    COLONOSCOPY N/A 04/16/2019   Procedure: COLONOSCOPY;  Surgeon: Harvey Margo CROME, MD;  [facility]: [facility];  Service: Endoscopy;  Laterality: N/A;  8:30am   COLONOSCOPY W/ POLYPECTOMY  05/2008   polypectomy x4-adenomatous; internal hemorrhoids   CORONARY ANGIOPLASTY WITH STENT PLACEMENT  06/04/2012     DES    to RI   CORONARY STENT PLACEMENT     x 1   DUPUYTREN / PALMAR FASCIOTOMY  2009   rt hand-dsc-went home same day x2   ERCP N/A 02/29/2024   Procedure: ERCP, WITH INTERVENTION IF INDICATED;  Surgeon: Charlanne Groom, MD;  Location: [facility] ENDOSCOPY;  Service: Gastroenterology;  Laterality: N/A;   ESOPHAGOGASTRODUODENOSCOPY N/A 04/16/2019   Procedure: ESOPHAGOGASTRODUODENOSCOPY (EGD);  Surgeon: Harvey Margo CROME, MD;  [facility];  Service: Endoscopy;  Laterality: N/A;   LEFT HEART CATHETERIZATION WITH CORONARY ANGIOGRAM N/A 05/28/2012   Procedure: LEFT HEART CATHETERIZATION WITH CORONARY ANGIOGRAM;  Surgeon: Erick JONELLE Bergamo, MD;  Location: [facility] CATH LAB;  Service: Cardiovascular;  Laterality: N/A;   PERCUTANEOUS CORONARY STENT INTERVENTION (PCI-S) N/A 06/04/2012   Procedure: PERCUTANEOUS CORONARY STENT INTERVENTION (PCI-S);  Surgeon: Erick JONELLE Bergamo, MD;  Location: [facility] CATH LAB;  Service: Cardiovascular;  Laterality: N/A;   POLYPECTOMY  04/16/2019   Procedure: POLYPECTOMY;  Surgeon: Harvey Margo CROME, MD;  Location: [facility];  Service: Endoscopy;;   TONSILLECTOMY     TOTAL HIP ARTHROPLASTY Right 06/24/2019   Procedure: TOTAL HIP ARTHROPLASTY;  Surgeon: Josefina Chew, MD;  Location: [facility] ORS;  Service: Orthopedics;  Laterality: Right;   VENTRAL HERNIA REPAIR  2010   umb hernia-[facility]-went home same day    Allergies  Allergies  Allergen Reactions   Empagliflozin       Home Medications    Prior to Admission medications   Medication Sig Start Date End Date Taking? Authorizing Provider  acebutolol  (SECTRAL ) 200 MG capsule Take 1 capsule (200 mg total) by mouth 2 (two) times daily. 12/14/23   Bergamo Heinz, MD  acetaminophen  (TYLENOL ) 500 MG tablet Take 1,000 mg by mouth every 6 (six) hours as needed.    [provider]  apixaban  (ELIQUIS ) 5 MG TABS tablet Take 5 mg by mouth 2 (two) times daily.    [provider]  HYDROcodone-acetaminophen  (NORCO/VICODIN) 5-325 MG tablet Take by mouth. 01/23/24   [provider]  lisinopril  (ZESTRIL ) 5 MG tablet TAKE (1) TABLET BY MOUTH ONCE DAILY. 12/06/23   Melvenia Manus BRAVO, MD  metFORMIN  (GLUCOPHAGE ) 500 MG tablet Take 1 tablet (500 mg total) by mouth 2 (two) times daily with a meal. 09/04/23   Melvenia Manus BRAVO, MD  Multiple Vitamins-Minerals (ONE-A-DAY 50 PLUS PO) Take 1 tablet by mouth at bedtime.     [provider]  nitroGLYCERIN  (NITROSTAT ) 0.4 MG SL tablet PLACE 1 TAB UNDER TONGUE EVERY 5 MIN IF NEEDED FOR CHEST  PAIN. MAY USE 3 TIMES.NO RELIEF CALL 911. 08/15/23   Ladona Heinz, MD  rosuvastatin  (CRESTOR ) 40 MG tablet Take 1 tablet (40 mg total) by mouth daily. Patient taking differently: Take 40 mg by mouth at bedtime. 07/06/23   Melvenia Manus BRAVO, MD  Semaglutide,0.25 or 0.5MG /DOS, 2 MG/3ML SOPN Inject 2 mg into the skin once a week. Inject 2mg  into skin on Sunday    [provider]  silodosin  (RAPAFLO ) 8 MG CAPS capsule Take 1 capsule (8 mg total) by mouth daily with breakfast. 03/31/24   Gerldine Lauraine BROCKS, FNP  spironolactone  (ALDACTONE ) 50 MG tablet TAKE 1 TABLET BY MOUTH EVERY MORNING 03/03/24   Melvenia Manus BRAVO, MD  torsemide  (DEMADEX ) 20 MG tablet Take 1 tablet (20 mg total) by mouth daily. 03/11/24   Tobie Suzzane POUR, MD  verapamil  (CALAN -SR) 120 MG CR tablet Take 1 tablet (120 mg total) by mouth daily with breakfast. 03/28/24   Bevely Doffing, FNP    Physical Exam    Vital  Signs:  MONTRAIL MEHRER does not have vital signs available for review today.  Given telephonic nature of communication, physical exam is limited. AAOx3. NAD. Normal affect.  Speech and respirations are unlabored.  Accessory Clinical Findings    None  Assessment & Plan    1.  Preoperative Cardiovascular Risk Assessment: According to the Revised Cardiac Risk Index (RCRI), his Perioperative Risk of Major Cardiac Event is (%): 0.9. His Functional Capacity in METs is: 5.07 according to the Duke Activity Status Index (DASI). Therefore, based on ACC/AHA guidelines, patient would be at acceptable risk for the planned procedure without further cardiovascular testing. I will route this recommendation to the requesting party via Epic fax function.  The patient was advised that if he develops new symptoms prior to surgery to contact our office to arrange for a follow-up visit, and he verbalized understanding.  Hold Eliquis  for 3 days prior to procedure, no bridging per Dr. Ladona, need to resume ASAP after procedure.   A copy of this note will be routed to requesting surgeon.  Time:   Today, I have spent 7 minutes with the patient with telehealth technology discussing medical history, symptoms, and management plan.     Lum LITTIE Louis, NP  04/17/2024, 10:33 AM

## 2024-04-21 ENCOUNTER — Observation Stay (HOSPITAL_COMMUNITY)
Admission: RE | Admit: 2024-04-21 | Discharge: 2024-04-22 | Disposition: A | Discharge status: Home / Self Care | Attending: Urology | Admitting: Urology

## 2024-04-21 ENCOUNTER — Encounter (HOSPITAL_COMMUNITY): Payer: Self-pay | Admitting: Urology

## 2024-04-21 ENCOUNTER — Other Ambulatory Visit: Payer: Self-pay

## 2024-04-21 ENCOUNTER — Encounter (HOSPITAL_COMMUNITY)
Admission: RE | Disposition: A | Discharge status: Home / Self Care | Payer: Self-pay | Source: Home / Self Care | Attending: Urology

## 2024-04-21 ENCOUNTER — Ambulatory Visit (HOSPITAL_COMMUNITY): Admitting: Anesthesiology

## 2024-04-21 DIAGNOSIS — N138 Other obstructive and reflux uropathy: Principal | ICD-10-CM | POA: Diagnosis present

## 2024-04-21 DIAGNOSIS — I10 Essential (primary) hypertension: Secondary | ICD-10-CM

## 2024-04-21 DIAGNOSIS — I2510 Atherosclerotic heart disease of native coronary artery without angina pectoris: Secondary | ICD-10-CM

## 2024-04-21 DIAGNOSIS — Z7984 Long term (current) use of oral hypoglycemic drugs: Secondary | ICD-10-CM | POA: Insufficient documentation

## 2024-04-21 DIAGNOSIS — N401 Enlarged prostate with lower urinary tract symptoms: Secondary | ICD-10-CM

## 2024-04-21 DIAGNOSIS — H353112 Nonexudative age-related macular degeneration, right eye, intermediate dry stage: Secondary | ICD-10-CM | POA: Diagnosis not present

## 2024-04-21 DIAGNOSIS — R338 Other retention of urine: Secondary | ICD-10-CM | POA: Insufficient documentation

## 2024-04-21 DIAGNOSIS — E119 Type 2 diabetes mellitus without complications: Secondary | ICD-10-CM | POA: Insufficient documentation

## 2024-04-21 DIAGNOSIS — N400 Enlarged prostate without lower urinary tract symptoms: Secondary | ICD-10-CM | POA: Diagnosis present

## 2024-04-21 DIAGNOSIS — E039 Hypothyroidism, unspecified: Secondary | ICD-10-CM | POA: Diagnosis not present

## 2024-04-21 DIAGNOSIS — H26493 Other secondary cataract, bilateral: Secondary | ICD-10-CM | POA: Diagnosis not present

## 2024-04-21 HISTORY — PX: TRANSURETHRAL RESECTION OF PROSTATE: SHX73

## 2024-04-21 LAB — GLUCOSE, CAPILLARY
Glucose-Capillary: 178 mg/dL — ABNORMAL HIGH (ref 70–99)
Glucose-Capillary: 159 mg/dL — ABNORMAL HIGH (ref 70–99)
Glucose-Capillary: 189 mg/dL — ABNORMAL HIGH (ref 70–99)
Glucose-Capillary: 249 mg/dL — ABNORMAL HIGH (ref 70–99)

## 2024-04-21 LAB — BASIC METABOLIC PANEL WITH GFR
Anion gap: 8 (ref 5–15)
BUN: 15 mg/dL (ref 8–23)
CO2: 24 mmol/L (ref 22–32)
Calcium: 8.2 mg/dL — ABNORMAL LOW (ref 8.9–10.3)
Chloride: 101 mmol/L (ref 98–111)
Creatinine, Ser: 0.97 mg/dL (ref 0.61–1.24)
GFR, Estimated: >60 mL/min (ref >60)
Glucose, Bld: 171 mg/dL — ABNORMAL HIGH (ref 70–99)
Potassium: 4.8 mmol/L (ref 3.5–5.1)
Sodium: 133 mmol/L — ABNORMAL LOW (ref 135–145)

## 2024-04-21 LAB — CBC
HCT: 38.8 % — ABNORMAL LOW (ref 39.0–52.0)
Hemoglobin: 13 g/dL (ref 13.0–17.0)
MCH: 30.8 pg (ref 26.0–34.0)
MCHC: 33.5 g/dL (ref 30.0–36.0)
MCV: 91.9 fL (ref 80.0–100.0)
Platelets: 199 K/uL (ref 150–400)
RBC: 4.22 MIL/uL (ref 4.22–5.81)
RDW: 15.1 % (ref 11.5–15.5)
WBC: 8 K/uL (ref 4.0–10.5)
nRBC: 0 % (ref 0.0–0.2)

## 2024-04-21 SURGERY — TURP (TRANSURETHRAL RESECTION OF PROSTATE)
Anesthesia: General | Site: Prostate

## 2024-04-21 MED ORDER — PHENYLEPHRINE 80 MCG/ML (10ML) SYRINGE FOR IV PUSH (FOR BLOOD PRESSURE SUPPORT)
PREFILLED_SYRINGE | INTRAVENOUS | Status: AC
  Filled 2024-04-21: qty 20

## 2024-04-21 MED ORDER — ACETAMINOPHEN 325 MG PO TABS: 650.0000 mg | ORAL_TABLET | ORAL | Status: DC | PRN

## 2024-04-21 MED ORDER — ORAL CARE MOUTH RINSE: 15.0000 mL | Freq: Once | OROMUCOSAL | Status: DC

## 2024-04-21 MED ORDER — DIPHENHYDRAMINE HCL 12.5 MG/5ML PO ELIX
12.5000 mg | ORAL_SOLUTION | Freq: Four times a day (QID) | ORAL | Status: DC | PRN
Start: 2024-04-21 — End: 2024-04-22

## 2024-04-21 MED ORDER — CEFAZOLIN SODIUM-DEXTROSE 2-4 GM/100ML-% IV SOLN
2.0000 g | INTRAVENOUS | Status: AC
  Administered 2024-04-21: 2 g via INTRAVENOUS

## 2024-04-21 MED ORDER — ONDANSETRON HCL 4 MG/2ML IJ SOLN
4.0000 mg | INTRAMUSCULAR | Status: DC | PRN
Start: 2024-04-21 — End: 2024-04-22

## 2024-04-21 MED ORDER — EPINEPHRINE 1 MG/10ML IJ SOSY
PREFILLED_SYRINGE | INTRAMUSCULAR | Status: AC
  Filled 2024-04-21: qty 20

## 2024-04-21 MED ORDER — OXYCODONE HCL 5 MG PO TABS
5.0000 mg | ORAL_TABLET | ORAL | Status: DC | PRN
  Administered 2024-04-21: 5 mg via ORAL
  Filled 2024-04-21: qty 1

## 2024-04-21 MED ORDER — ROCURONIUM BROMIDE 10 MG/ML (PF) SYRINGE
PREFILLED_SYRINGE | INTRAVENOUS | Status: AC
  Filled 2024-04-21: qty 10

## 2024-04-21 MED ORDER — INSULIN ASPART 100 UNIT/ML IJ SOLN
0.0000 [IU] | Freq: Three times a day (TID) | INTRAMUSCULAR | Status: DC
  Administered 2024-04-21 – 2024-04-22 (×2): 3 [IU] via SUBCUTANEOUS

## 2024-04-21 MED ORDER — OXYBUTYNIN CHLORIDE 5 MG PO TABS: 5.0000 mg | ORAL_TABLET | Freq: Three times a day (TID) | ORAL | Status: DC | PRN

## 2024-04-21 MED ORDER — CHLORHEXIDINE GLUCONATE CLOTH 2 % EX PADS
6.0000 | MEDICATED_PAD | Freq: Every day | CUTANEOUS | Status: DC
  Administered 2024-04-22: 6 via TOPICAL

## 2024-04-21 MED ORDER — SODIUM CHLORIDE 0.9 % IR SOLN
Status: DC | PRN
  Administered 2024-04-21 (×8): 3000 mL

## 2024-04-21 MED ORDER — CEFAZOLIN SODIUM-DEXTROSE 2-4 GM/100ML-% IV SOLN
INTRAVENOUS | Status: AC
  Filled 2024-04-21: qty 100

## 2024-04-21 MED ORDER — CHLORHEXIDINE GLUCONATE 0.12 % MT SOLN: 15.0000 mL | Freq: Once | OROMUCOSAL | Status: DC

## 2024-04-21 MED ORDER — LACTATED RINGERS IV SOLN: INTRAVENOUS | Status: DC

## 2024-04-21 MED ORDER — FENTANYL CITRATE (PF) 100 MCG/2ML IJ SOLN
INTRAMUSCULAR | Status: AC
  Filled 2024-04-21: qty 2

## 2024-04-21 MED ORDER — WATER FOR IRRIGATION, STERILE IR SOLN
Status: DC | PRN
  Administered 2024-04-21: 500 mL

## 2024-04-21 MED ORDER — SODIUM CHLORIDE 0.9 % IV SOLN: INTRAVENOUS | Status: DC

## 2024-04-21 MED ORDER — SENNOSIDES-DOCUSATE SODIUM 8.6-50 MG PO TABS
2.0000 | ORAL_TABLET | Freq: Every day | ORAL | Status: DC
  Administered 2024-04-21: 2 via ORAL
  Filled 2024-04-21: qty 2

## 2024-04-21 MED ORDER — OXYCODONE HCL 5 MG PO TABS
5.0000 mg | ORAL_TABLET | Freq: Once | ORAL | Status: AC | PRN
  Administered 2024-04-21: 5 mg via ORAL
  Filled 2024-04-21: qty 1

## 2024-04-21 MED ORDER — EPHEDRINE 5 MG/ML INJ
INTRAVENOUS | Status: AC
  Filled 2024-04-21: qty 5

## 2024-04-21 MED ORDER — LIDOCAINE 2% (20 MG/ML) 5 ML SYRINGE
INTRAMUSCULAR | Status: AC
  Filled 2024-04-21: qty 5

## 2024-04-21 MED ORDER — OXYCODONE HCL 5 MG/5ML PO SOLN: 5.0000 mg | Freq: Once | ORAL | Status: AC | PRN

## 2024-04-21 MED ORDER — DIPHENHYDRAMINE HCL 50 MG/ML IJ SOLN
12.5000 mg | Freq: Four times a day (QID) | INTRAMUSCULAR | Status: DC | PRN
Start: 2024-04-21 — End: 2024-04-22

## 2024-04-21 MED ORDER — MELATONIN 5 MG PO TABS
10.0000 mg | ORAL_TABLET | Freq: Every day | ORAL | Status: DC
  Filled 2024-04-21: qty 2

## 2024-04-21 MED ORDER — MELATONIN 3 MG PO TABS
9.0000 mg | ORAL_TABLET | Freq: Every day | ORAL | Status: DC
  Administered 2024-04-21: 9 mg via ORAL
  Filled 2024-04-21: qty 3

## 2024-04-21 MED ORDER — ONDANSETRON HCL 4 MG/2ML IJ SOLN: 4.0000 mg | Freq: Once | INTRAMUSCULAR | Status: DC | PRN

## 2024-04-21 MED ORDER — ARTIFICIAL TEARS OPHTHALMIC OINT
TOPICAL_OINTMENT | OPHTHALMIC | Status: AC
  Filled 2024-04-21: qty 3.5

## 2024-04-21 MED ORDER — PHENYLEPHRINE 80 MCG/ML (10ML) SYRINGE FOR IV PUSH (FOR BLOOD PRESSURE SUPPORT)
PREFILLED_SYRINGE | INTRAVENOUS | Status: AC
Start: 2024-04-21 — End: 2024-04-21
  Filled 2024-04-21: qty 10

## 2024-04-21 MED ORDER — ZOLPIDEM TARTRATE 5 MG PO TABS: 5.0000 mg | ORAL_TABLET | Freq: Every evening | ORAL | Status: DC | PRN

## 2024-04-21 MED ORDER — FENTANYL CITRATE PF 50 MCG/ML IJ SOSY: 25.0000 ug | PREFILLED_SYRINGE | INTRAMUSCULAR | Status: DC | PRN

## 2024-04-21 MED ORDER — ROSUVASTATIN CALCIUM 20 MG PO TABS
40.0000 mg | ORAL_TABLET | Freq: Every day | ORAL | Status: DC
  Administered 2024-04-21: 40 mg via ORAL
  Filled 2024-04-21: qty 2

## 2024-04-21 MED ORDER — INSULIN ASPART 100 UNIT/ML IJ SOLN
0.0000 [IU] | Freq: Every day | INTRAMUSCULAR | Status: DC
  Administered 2024-04-21: 2 [IU] via SUBCUTANEOUS

## 2024-04-21 MED ORDER — SODIUM CHLORIDE 0.9 % IR SOLN
3000.0000 mL | Status: DC
  Administered 2024-04-21 – 2024-04-22 (×2): 3000 mL

## 2024-04-21 MED ORDER — FENTANYL CITRATE (PF) 100 MCG/2ML IJ SOLN
INTRAMUSCULAR | Status: DC | PRN
  Administered 2024-04-21: 25 ug via INTRAVENOUS
  Administered 2024-04-21: 50 ug via INTRAVENOUS
  Administered 2024-04-21: 25 ug via INTRAVENOUS

## 2024-04-21 MED ORDER — ONDANSETRON HCL 4 MG/2ML IJ SOLN
INTRAMUSCULAR | Status: AC
  Filled 2024-04-21: qty 2

## 2024-04-21 MED ORDER — SPIRONOLACTONE 25 MG PO TABS
50.0000 mg | ORAL_TABLET | Freq: Every morning | ORAL | Status: DC
  Administered 2024-04-22: 50 mg via ORAL
  Filled 2024-04-21: qty 2

## 2024-04-21 MED ORDER — PROPOFOL 10 MG/ML IV BOLUS
INTRAVENOUS | Status: DC | PRN
  Administered 2024-04-21: 100 mg via INTRAVENOUS

## 2024-04-21 MED ORDER — ONDANSETRON HCL 4 MG/2ML IJ SOLN
INTRAMUSCULAR | Status: DC | PRN
  Administered 2024-04-21: 4 mg via INTRAVENOUS

## 2024-04-21 MED ORDER — ACEBUTOLOL HCL 200 MG PO CAPS
200.0000 mg | ORAL_CAPSULE | Freq: Two times a day (BID) | ORAL | Status: DC
  Filled 2024-04-21 (×3): qty 1

## 2024-04-21 MED ORDER — ROCURONIUM BROMIDE 10 MG/ML (PF) SYRINGE
PREFILLED_SYRINGE | INTRAVENOUS | Status: DC | PRN
  Administered 2024-04-21: 50 mg via INTRAVENOUS
  Administered 2024-04-21: 10 mg via INTRAVENOUS

## 2024-04-21 MED ORDER — FENTANYL CITRATE PF 50 MCG/ML IJ SOSY
25.0000 ug | PREFILLED_SYRINGE | INTRAMUSCULAR | Status: DC | PRN
  Administered 2024-04-21 (×2): 25 ug via INTRAVENOUS
  Filled 2024-04-21: qty 1

## 2024-04-21 MED ORDER — SUGAMMADEX SODIUM 200 MG/2ML IV SOLN
INTRAVENOUS | Status: DC | PRN
Start: 2024-04-21 — End: 2024-04-21
  Administered 2024-04-21: 200 mg via INTRAVENOUS

## 2024-04-21 MED ORDER — VERAPAMIL HCL ER 240 MG PO TBCR
120.0000 mg | EXTENDED_RELEASE_TABLET | Freq: Every day | ORAL | Status: DC
  Filled 2024-04-21: qty 1

## 2024-04-21 MED ORDER — DEXMEDETOMIDINE HCL IN NACL 80 MCG/20ML IV SOLN
INTRAVENOUS | Status: AC
  Filled 2024-04-21: qty 60

## 2024-04-21 MED ORDER — DEXMEDETOMIDINE HCL IN NACL 80 MCG/20ML IV SOLN
INTRAVENOUS | Status: DC | PRN
  Administered 2024-04-21: 8 ug via INTRAVENOUS

## 2024-04-21 MED ORDER — SUGAMMADEX SODIUM 200 MG/2ML IV SOLN
INTRAVENOUS | Status: AC
  Filled 2024-04-21: qty 2

## 2024-04-21 MED ORDER — LISINOPRIL 5 MG PO TABS
5.0000 mg | ORAL_TABLET | Freq: Every day | ORAL | Status: DC
  Administered 2024-04-21: 5 mg via ORAL
  Filled 2024-04-21 (×2): qty 1

## 2024-04-21 MED ORDER — PHENYLEPHRINE 80 MCG/ML (10ML) SYRINGE FOR IV PUSH (FOR BLOOD PRESSURE SUPPORT)
PREFILLED_SYRINGE | INTRAVENOUS | Status: DC | PRN
  Administered 2024-04-21: 80 ug via INTRAVENOUS

## 2024-04-21 SURGICAL SUPPLY — 20 items
BAG DRAIN URO TABLE W/ADPT NS (BAG) ×2 IMPLANT
BAG URINE DRAIN TURP 4L (OSTOMY) ×2 IMPLANT
CATH FOLEY 3WAY 30CC 22F (CATHETERS) ×2 IMPLANT
CLOTH BEACON ORANGE TIMEOUT ST (SAFETY) ×2 IMPLANT
ELECTRODE REM PT RTRN 9FT ADLT (ELECTROSURGICAL) ×2 IMPLANT
GLOVE BIO SURGEON STRL SZ8 (GLOVE) ×2 IMPLANT
GLOVE BIOGEL PI IND STRL 7.0 (GLOVE) ×4 IMPLANT
GOWN STRL REUS W/TWL LRG LVL3 (GOWN DISPOSABLE) ×2 IMPLANT
GOWN STRL REUS W/TWL XL LVL3 (GOWN DISPOSABLE) ×2 IMPLANT
KIT TURNOVER CYSTO (KITS) ×2 IMPLANT
LOOP CUT BIPOLAR 24F LRG (ELECTROSURGICAL) ×2 IMPLANT
MANIFOLD NEPTUNE II (INSTRUMENTS) ×2 IMPLANT
PACK CYSTO (CUSTOM PROCEDURE TRAY) ×2 IMPLANT
PAD ARMBOARD POSITIONER FOAM (MISCELLANEOUS) ×2 IMPLANT
POSITIONER HEAD 8X9X4 ADT (SOFTGOODS) ×2 IMPLANT
SOL .9 NS 3000ML IRR UROMATIC (IV SOLUTION) ×8 IMPLANT
SYR 30ML LL (SYRINGE) ×2 IMPLANT
SYRINGE TOOMEY IRRIG 70ML (MISCELLANEOUS) ×2 IMPLANT
TOWEL OR 17X26 4PK STRL BLUE (TOWEL DISPOSABLE) ×2 IMPLANT
WATER STERILE IRR 500ML POUR (IV SOLUTION) ×2 IMPLANT

## 2024-04-21 NOTE — Anesthesia Preprocedure Evaluation (Signed)
 Anesthesia Evaluation  Patient identified by MRN, date of birth, ID band Patient awake    Reviewed: Allergy & Precautions, H&P , NPO status , Patient's Chart, lab work & pertinent test results, reviewed documented beta blocker date and time   Airway Mallampati: II  TM Distance: >3 FB Neck ROM: full    Dental no notable dental hx.    Pulmonary sleep apnea    Pulmonary exam normal breath sounds clear to auscultation       Cardiovascular Exercise Tolerance: Good hypertension, + CAD  + Valvular Problems/Murmurs  Rhythm:regular Rate:Normal     Neuro/Psych CVA  negative psych ROS   GI/Hepatic negative GI ROS, Neg liver ROS,,,  Endo/Other  diabetesHypothyroidism    Renal/GU negative Renal ROS  negative genitourinary   Musculoskeletal   Abdominal   Peds  Hematology negative hematology ROS (+)   Anesthesia Other Findings   Reproductive/Obstetrics negative OB ROS                              Anesthesia Physical Anesthesia Plan  ASA: 3  Anesthesia Plan: General and General ETT   Post-op Pain Management:    Induction:   PONV Risk Score and Plan: Ondansetron   Airway Management Planned:   Additional Equipment:   Intra-op Plan:   Post-operative Plan:   Informed Consent: I have reviewed the patients History and Physical, chart, labs and discussed the procedure including the risks, benefits and alternatives for the proposed anesthesia with the patient or authorized representative who has indicated his/her understanding and acceptance.     Dental Advisory Given  Plan Discussed with: CRNA  Anesthesia Plan Comments:         Anesthesia Quick Evaluation

## 2024-04-21 NOTE — Plan of Care (Signed)
  Problem: Education: Goal: Knowledge of General Education information will improve Description: Including pain rating scale, medication(s)/side effects and non-pharmacologic comfort measures Outcome: Progressing   Problem: Clinical Measurements: Goal: Diagnostic test results will improve Outcome: Progressing Goal: Respiratory complications will improve Outcome: Progressing Goal: Cardiovascular complication will be avoided Outcome: Progressing

## 2024-04-21 NOTE — Transfer of Care (Signed)
 Immediate Anesthesia Transfer of Care Note  Patient: Omar [name]  Procedure(s) Performed: TURP (TRANSURETHRAL RESECTION OF PROSTATE) (Prostate)  Patient Location: PACU  Anesthesia Type:General  Level of Consciousness: awake and patient cooperative  Airway & Oxygen Therapy: Patient Spontanous Breathing  Post-op Assessment: Report given to RN and Post -op Vital signs reviewed and stable  Post vital signs: Reviewed and stable  Last Vitals:  Vitals Value Taken Time  BP 107/70 04/21/24 14:06  Temp 97.8 04/21/24  1410  Pulse 58 04/21/24 14:09  Resp 15 04/21/24 14:09  SpO2 96 % 04/21/24 14:09  Vitals shown include unfiled device data.  Last Pain:  Vitals:   04/21/24 1200  PainSc: 0-No pain         Complications: No notable events documented.

## 2024-04-21 NOTE — Interval H&P Note (Signed)
 History and Physical Interval Note:  04/21/2024 12:27 PM  Omar [name]  has presented today for surgery, with the diagnosis of benign prostatic hyperplasia with urinary retention.  The various methods of treatment have been discussed with the patient and family. After consideration of risks, benefits and other options for treatment, the patient has consented to  Procedure(s): TURP (TRANSURETHRAL RESECTION OF PROSTATE) (N/A) as a surgical intervention.  The patient's history has been reviewed, patient examined, no change in status, stable for surgery.  I have reviewed the patient's chart and labs.  Questions were answered to the patient's satisfaction.     [name]

## 2024-04-21 NOTE — Anesthesia Procedure Notes (Signed)
 Procedure Name: Intubation Date/Time: 04/21/2024 1:01 PM  Performed by: Barbarann Verneita RAMAN, CRNAPre-anesthesia Checklist: Patient identified, Patient being monitored, Timeout performed, Emergency Drugs available and Suction available Patient Re-evaluated:Patient Re-evaluated prior to induction Oxygen Delivery Method: Circle System Utilized Preoxygenation: Pre-oxygenation with 100% oxygen Induction Type: IV induction Ventilation: Mask ventilation without difficulty Laryngoscope Size: Mac and 3 Grade View: Grade I Tube type: Oral Tube size: 7.0 mm Number of attempts: 1 Airway Equipment and Method: Stylet Placement Confirmation: ETT inserted through vocal cords under direct vision, positive ETCO2 and breath sounds checked- equal and bilateral Secured at: 22 cm Tube secured with: Tape Dental Injury: Teeth and Oropharynx as per pre-operative assessment

## 2024-04-21 NOTE — Plan of Care (Signed)
  Problem: Education: Goal: Knowledge of General Education information will improve Description: Including pain rating scale, medication(s)/side effects and non-pharmacologic comfort measures Outcome: Progressing   Problem: Health Behavior/Discharge Planning: Goal: Ability to manage health-related needs will improve Outcome: Progressing   Problem: Clinical Measurements: Goal: Ability to maintain clinical measurements within normal limits will improve Outcome: Progressing Goal: Will remain free from infection Outcome: Progressing Goal: Diagnostic test results will improve Outcome: Progressing Goal: Respiratory complications will improve Outcome: Progressing Goal: Cardiovascular complication will be avoided Outcome: Progressing   Problem: Activity: Goal: Risk for activity intolerance will decrease Outcome: Progressing   Problem: Nutrition: Goal: Adequate nutrition will be maintained Outcome: Progressing   Problem: Coping: Goal: Level of anxiety will decrease Outcome: Progressing   Problem: Elimination: Goal: Will not experience complications related to bowel motility Outcome: Progressing Goal: Will not experience complications related to urinary retention Outcome: Progressing   Problem: Pain Managment: Goal: General experience of comfort will improve and/or be controlled Outcome: Progressing   Problem: Safety: Goal: Ability to remain free from injury will improve Outcome: Progressing   Problem: Skin Integrity: Goal: Risk for impaired skin integrity will decrease Outcome: Progressing   Problem: Education: Goal: Ability to describe self-care measures that may prevent or decrease complications (Diabetes Survival Skills Education) will improve Outcome: Progressing Goal: Individualized Educational Video(s) Outcome: Progressing   Problem: Coping: Goal: Ability to adjust to condition or change in health will improve Outcome: Progressing   Problem: Fluid  Volume: Goal: Ability to maintain a balanced intake and output will improve Outcome: Progressing   Problem: Health Behavior/Discharge Planning: Goal: Ability to identify and utilize available resources and services will improve Outcome: Progressing Goal: Ability to manage health-related needs will improve Outcome: Progressing   Problem: Metabolic: Goal: Ability to maintain appropriate glucose levels will improve Outcome: Progressing   Problem: Nutritional: Goal: Maintenance of adequate nutrition will improve Outcome: Progressing Goal: Progress toward achieving an optimal weight will improve Outcome: Progressing   Problem: Skin Integrity: Goal: Risk for impaired skin integrity will decrease Outcome: Progressing   Problem: Tissue Perfusion: Goal: Adequacy of tissue perfusion will improve Outcome: Progressing   Problem: Education: Goal: Knowledge of the prescribed therapeutic regimen will improve Outcome: Progressing   Problem: Bowel/Gastric: Goal: Gastrointestinal status for postoperative course will improve Outcome: Progressing   Problem: Health Behavior/Discharge Planning: Goal: Identification of resources available to assist in meeting health care needs will improve Outcome: Progressing   Problem: Skin Integrity: Goal: Demonstration of wound healing without infection will improve Outcome: Progressing   Problem: Urinary Elimination: Goal: Ability to avoid or minimize complications of infection will improve Outcome: Progressing

## 2024-04-21 NOTE — Op Note (Signed)
 Preoperative diagnosis: BPH, with urinary retention  Postoperative diagnosis: BPH  Procedure: 1 cystoscopy 2. Transurethral resection of the prostate  Attending: [name]  Anesthesia: General  Estimated blood loss: Minimal  Drains: 22 French foley  Specimens: 1. Prostate Chips  Antibiotics: Rocephin   Findings: Trilobar prostate enlargement. Ureteral orifices in normal anatomic location.   Indications: Patient is a 78 year old male with a history of BPH and urinary retention requiring chronic foley.  After discussing treatment options, they decided proceed with transurethral resection of the prostate.  Procedure in detail: The patient was brought to the operating room and a brief timeout was done to ensure correct patient, correct procedure, correct site.  General anesthesia was administered patient was placed in dorsal lithotomy position.  Their genitalia was then prepped and draped in usual sterile fashion.  A rigid 22 French cystoscope was passed in the urethra and the bladder.  Bladder was inspected and we noted no masses or lesions.  the ureteral orifices were in the normal orthotopic locations. removed the cystoscope and placed a resectoscope into the bladder. We then turned our attention to the prostate resection. Using the bipolar resectoscope we resected the median lobe first from the bladder neck to the verumontanum. We then started at the 12 oclock position on the left lobe and resection to the 6 o'clock position from the bladder neck to the verumontanum. We then did the same resection of the right lobe. Once the resection was complete we then cauterized individual bleeders. We then removed the prostate chips and sent them for pathology.  We then re-inspected the prostatic fossa and found no residual bleeding.  the bladder was then drained, a 22 French foley was placed and this concluded the procedure which was well tolerated by patient.  Complications: None  Condition:  Stable, extubated, transferred to PACU  Plan: Patient is admitted overnight with continuous bladder irrigation. If their urine is clear tomorrow they will be discharged home and followup in 5 days for foley catheter removal and pathology discussion.

## 2024-04-22 ENCOUNTER — Other Ambulatory Visit (HOSPITAL_COMMUNITY): Payer: Self-pay | Admitting: Orthopedic Surgery

## 2024-04-22 ENCOUNTER — Encounter (HOSPITAL_COMMUNITY): Payer: Self-pay | Admitting: Urology

## 2024-04-22 DIAGNOSIS — M720 Palmar fascial fibromatosis [Dupuytren]: Secondary | ICD-10-CM

## 2024-04-22 DIAGNOSIS — N401 Enlarged prostate with lower urinary tract symptoms: Secondary | ICD-10-CM | POA: Diagnosis not present

## 2024-04-22 LAB — CBC
HCT: 40.2 % (ref 39.0–52.0)
Hemoglobin: 13.2 g/dL (ref 13.0–17.0)
MCH: 30.3 pg (ref 26.0–34.0)
MCHC: 32.8 g/dL (ref 30.0–36.0)
MCV: 92.2 fL (ref 80.0–100.0)
Platelets: 206 K/uL (ref 150–400)
RBC: 4.36 MIL/uL (ref 4.22–5.81)
RDW: 15 % (ref 11.5–15.5)
WBC: 11.1 K/uL — ABNORMAL HIGH (ref 4.0–10.5)
nRBC: 0 % (ref 0.0–0.2)

## 2024-04-22 LAB — BASIC METABOLIC PANEL WITH GFR
Anion gap: 10 (ref 5–15)
BUN: 15 mg/dL (ref 8–23)
CO2: 25 mmol/L (ref 22–32)
Calcium: 8.6 mg/dL — ABNORMAL LOW (ref 8.9–10.3)
Chloride: 101 mmol/L (ref 98–111)
Creatinine, Ser: 0.79 mg/dL (ref 0.61–1.24)
GFR, Estimated: >60 mL/min (ref >60)
Glucose, Bld: 119 mg/dL — ABNORMAL HIGH (ref 70–99)
Potassium: 4.5 mmol/L (ref 3.5–5.1)
Sodium: 136 mmol/L (ref 135–145)

## 2024-04-22 LAB — GLUCOSE, CAPILLARY: Glucose-Capillary: 159 mg/dL — ABNORMAL HIGH (ref 70–99)

## 2024-04-22 LAB — SURGICAL PATHOLOGY

## 2024-04-22 MED ORDER — HYDROCODONE-ACETAMINOPHEN 5-325 MG PO TABS: 1.0000 | ORAL_TABLET | ORAL | 0 refills | Status: DC | PRN

## 2024-04-22 NOTE — TOC CM/SW Note (Signed)
 Transition of Care [facility]) - Inpatient Brief Assessment   Patient Details  Name: Omar [name] MRN: [id number] Date of Birth: Oct 02, 1946  Transition of Care [facility]) CM/SW Contact:    Noreen KATHEE Pinal, LCSWA Phone Number: 04/22/2024, 9:14 AM   Clinical Narrative:  Transition of Care Department [facility]) has reviewed patient and no TOC needs have been identified at this time. We will continue to monitor patient advancement through interdisciplinary progression rounds. If new patient transition needs arise, please place a TOC consult.  Transition of Care Asessment: Insurance and Status: Insurance coverage has been reviewed Patient has primary care physician: Yes Home environment has been reviewed: Single Family Home Prior level of function:: Independent   Social Drivers of Health Review: SDOH reviewed no interventions necessary Readmission risk has been reviewed: Yes Transition of care needs: no transition of care needs at this time

## 2024-04-24 ENCOUNTER — Ambulatory Visit

## 2024-04-24 DIAGNOSIS — E291 Testicular hypofunction: Secondary | ICD-10-CM | POA: Diagnosis not present

## 2024-04-24 MED ORDER — TESTOSTERONE CYPIONATE 200 MG/ML IM SOLN
200.0000 mg | Freq: Once | INTRAMUSCULAR | Status: AC
  Administered 2024-04-24: 200 mg via INTRAMUSCULAR

## 2024-04-24 NOTE — Progress Notes (Addendum)
 Patient receiving TESTOSTERONE  injection per MD order  The injection site was cleaned and prepped with alcohol. A band aid applied after injection given.   IM injection  Medication: TESTOSTERONE  Dose: 0.5ML Location: left BUTTOCKS Lot: [id number]  Exp: 05/02/2025   Patient tolerated well, no complications were noted  Performed by: Ardena Gangl LPN

## 2024-04-24 NOTE — Addendum Note (Signed)
 Addended byBETHA MALACHY SLICE on: 04/24/2024 11:31 AM   Modules accepted: Level of Service

## 2024-04-24 NOTE — Progress Notes (Signed)
 Name: Omar [name] DOB: April 10, 1946 MRN: [id number]  Diagnoses: Post-operative state  HPI: ANSEN [name] presents post-operatively.  Relevant History includes: 1. BPH. - Family history of prostate cancer (father). - 10/23/2023: PSA was normal (1.5). 2. 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 3. Erectile dysfunction. 4. Kidney stone. - 02/27/2024: CT showed a non-obstructing 5 mm right renal stone.  Today He presents s/p the following procedures by Dr. Sherrilee on 04/21/2024:  Preoperative diagnosis: BPH, with urinary retention   Postoperative diagnosis: BPH   Procedure:  1. cystoscopy 2. Transurethral resection of the prostate  Pathology:  A. PROSTATE CHIPS, TURP:  Benign prostatic hyperplasia   Postop course: Today: He denies gross hematuria.  He denies acute flank pain, abdominal pain, fevers, nausea, or vomiting.   Medications: Current Outpatient Medications  Medication Sig Dispense Refill   acebutolol  (SECTRAL ) 200 MG capsule Take 1 capsule (200 mg total) by mouth 2 (two) times daily. 180 capsule 3   acetaminophen  (TYLENOL ) 500 MG tablet Take 1,000 mg by mouth every 6 (six) hours as needed.     [Paused] apixaban  (ELIQUIS ) 5 MG TABS tablet Take 5 mg by mouth 2 (two) times daily.     HYDROcodone -acetaminophen  (NORCO/VICODIN) 5-325 MG tablet Take 1 tablet by mouth every 4 (four) hours as needed for moderate pain (pain score 4-6) or severe pain (pain score 7-10). 30 tablet 0   lisinopril  (ZESTRIL ) 5 MG tablet TAKE (1) TABLET BY MOUTH ONCE DAILY. 90 tablet 3   metFORMIN  (GLUCOPHAGE ) 500 MG tablet Take 1 tablet (500 mg total) by mouth 2 (two) times daily with a meal. (Patient taking differently: Take 1,000 mg by mouth 2 (two) times daily with a meal.) 180 tablet 3   Multiple Vitamins-Minerals (ONE-A-DAY 50 PLUS PO) Take 1 tablet by mouth at bedtime.      nitroGLYCERIN  (NITROSTAT ) 0.4 MG SL tablet PLACE 1 TAB UNDER TONGUE EVERY 5 MIN IF NEEDED FOR CHEST PAIN. MAY USE 3  TIMES.NO RELIEF CALL 911. 25 tablet 3   rosuvastatin  (CRESTOR ) 40 MG tablet Take 1 tablet (40 mg total) by mouth daily. (Patient taking differently: Take 40 mg by mouth at bedtime.) 90 tablet 3   silodosin  (RAPAFLO ) 8 MG CAPS capsule Take 8 mg by mouth daily with breakfast.     spironolactone  (ALDACTONE ) 50 MG tablet TAKE 1 TABLET BY MOUTH EVERY MORNING 90 tablet 1   torsemide  (DEMADEX ) 20 MG tablet Take 1 tablet (20 mg total) by mouth daily. (Patient taking differently: Take 20 mg by mouth daily as needed (Fluid).) 30 tablet 1   verapamil  (CALAN -SR) 120 MG CR tablet Take 1 tablet (120 mg total) by mouth daily with breakfast. 90 tablet 1   Current Facility-Administered Medications  Medication Dose Route Frequency Provider Last Rate Last Admin   ciprofloxacin  (CIPRO ) tablet 500 mg  500 mg Oral Once Gerldine Lauraine BROCKS, FNP       testosterone  cypionate (DEPOTESTOSTERONE CYPIONATE) injection 200 mg  200 mg Intramuscular Weekly Wrenn, John, MD   200 mg at 04/07/24 9157    Allergies: Allergies  Allergen Reactions   Empagliflozin  Other (See Comments)    Cause UTI/Sepsis    Past Medical History:  Diagnosis Date   Abnormal EKG    Inferior Q waves   BPH (benign prostatic hyperplasia)    Chronic anticoagulation 01/16/2017   Coronary artery disease    Degenerative joint disease    Diabetes mellitus, type 2 (HCC)    No insulin    DVT (deep venous thrombosis) (HCC)  4 -5 yrs ago   DVT of leg (deep venous thrombosis) (HCC) 07/02/2015   Heart murmur    Hepatic steatosis    Heterozygous factor V Leiden mutation (HCC) 01/16/2017   Hyperlipidemia    Hypertension    Echo in 2008-technically limited, mild LVH, normal EF; 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 Milton interpreted the study-CPAP recommended, but  refused by patient   Stroke [facility])    no deficits from stroke, Didnt know I had one when they say I did.   Past Surgical History:  Procedure Laterality Date   BIOPSY  04/16/2019   Procedure: BIOPSY;  Surgeon: Harvey Margo CROME, MD;  Location: [facility];  Service: Endoscopy;;   CATARACT EXTRACTION W/PHACO Left 04/03/2016   Procedure: CATARACT EXTRACTION PHACO AND INTRAOCULAR LENS PLACEMENT LEFT EYE CDE=21.76;  Surgeon: Dow JULIANNA Burke, MD;  Location: [facility] ORS;  Service: Ophthalmology;  Laterality: Left;   CATARACT EXTRACTION W/PHACO Right 06/19/2016   Procedure: CATARACT EXTRACTION PHACO AND INTRAOCULAR LENS PLACEMENT; CDE:  11.56;  Surgeon: Dow JULIANNA Burke, MD;  Location: [facility];  Service: Ophthalmology;  Laterality: Right;   CHOLECYSTECTOMY N/A 03/03/2024   Procedure: LAPAROSCOPIC CHOLECYSTECTOMY;  Surgeon: Rubin Calamity, MD;  [facility] OR;  Service: General;  Laterality: N/A;  with indocyanine green    COLONOSCOPY N/A 04/16/2019   Procedure: COLONOSCOPY;  Surgeon: Harvey Margo CROME, MD;  Location: [facility];  Service: Endoscopy;  Laterality: N/A;  8:30am   COLONOSCOPY W/ POLYPECTOMY  05/2008   polypectomy x4-adenomatous; internal hemorrhoids   CORONARY ANGIOPLASTY WITH STENT PLACEMENT  06/04/2012     DES    to RI   CORONARY STENT PLACEMENT     x 1   DUPUYTREN / PALMAR FASCIOTOMY  2009   rt hand-dsc-went home same day x2   ERCP N/A 02/29/2024   Procedure: ERCP, WITH INTERVENTION IF INDICATED;  Surgeon: Charlanne Groom, MD;  Location: [facility] ENDOSCOPY;  Service: Gastroenterology;  Laterality: N/A;   ESOPHAGOGASTRODUODENOSCOPY N/A 04/16/2019   Procedure: ESOPHAGOGASTRODUODENOSCOPY (EGD);  Surgeon: Harvey Margo CROME, MD;  [facility];  Service: Endoscopy;  Laterality: N/A;   LEFT HEART CATHETERIZATION WITH CORONARY ANGIOGRAM N/A 05/28/2012   Procedure: LEFT HEART CATHETERIZATION WITH CORONARY ANGIOGRAM;  Surgeon: Erick JONELLE Bergamo, MD;  Location: [facility] CATH LAB;  Service: Cardiovascular;   Laterality: N/A;   PERCUTANEOUS CORONARY STENT INTERVENTION (PCI-S) N/A 06/04/2012   Procedure: PERCUTANEOUS CORONARY STENT INTERVENTION (PCI-S);  Surgeon: Erick JONELLE Bergamo, MD;  Location: [facility] CATH LAB;  Service: Cardiovascular;  Laterality: N/A;   POLYPECTOMY  04/16/2019   Procedure: POLYPECTOMY;  Surgeon: Harvey Margo CROME, MD;  [facility];  Service: Endoscopy;;   TONSILLECTOMY     TOTAL HIP ARTHROPLASTY Right 06/24/2019   Procedure: TOTAL HIP ARTHROPLASTY;  Surgeon: Josefina Chew, MD;  Location: [facility] ORS;  Service: Orthopedics;  Laterality: Right;   TRANSURETHRAL RESECTION OF PROSTATE N/A 04/21/2024   Procedure: TURP (TRANSURETHRAL RESECTION OF PROSTATE);  Surgeon: Sherrilee Belvie CROME, MD;  Location: [facility];  Service: Urology;  Laterality: N/A;   VENTRAL HERNIA REPAIR  2010   umb hernia-[facility]-went home same day   Family History  Problem Relation Age of Onset   Cancer Mother    Brain cancer Mother  Cancer Father    Prostate cancer Father    Breast cancer Daughter    Thyroid  cancer Daughter    Coronary artery disease Neg Hx    Colon cancer Neg Hx    Social History   Socioeconomic History   Marital status: Married    Spouse name: Not on file   Number of children: 2   Years of education: Not on file   Highest education level: Not on file  Occupational History   Occupation: Airline pilot    Comment: Doctor, general practice  Tobacco Use   Smoking status: Never    Passive exposure: Never   Smokeless tobacco: Never  Vaping Use   Vaping status: Never Used  Substance and Sexual Activity   Alcohol use: No   Drug use: No   Sexual activity: Not Currently    Birth control/protection: None  Other Topics Concern   Not on file  Social History Narrative   Married for 54 yrs,lives with wife.Works  part time at [employer].   Social Drivers of Corporate investment banker Strain: Not on file  Food Insecurity: No Food Insecurity (04/21/2024)   Hunger Vital Sign     Worried About Running Out of Food in the Last Year: Never true    Ran Out of Food in the Last Year: Never true  Transportation Needs: No Transportation Needs (04/21/2024)   PRAPARE - Administrator, Civil Service (Medical): No    Lack of Transportation (Non-Medical): No  Physical Activity: Not on file  Stress: Not on file  Social Connections: Socially Integrated (04/21/2024)   Social Connection and Isolation Panel    Frequency of Communication with Friends and Family: More than three times a week    Frequency of Social Gatherings with Friends and Family: Twice a week    Attends Religious Services: 1 to 4 times per year    Active Member of [employer] or Organizations: Yes    Attends Banker Meetings: 1 to 4 times per year    Marital Status: Married  Catering manager Violence: Not At Risk (04/21/2024)   Humiliation, Afraid, Rape, and Kick questionnaire    Fear of Current or Ex-Partner: No    Emotionally Abused: No    Physically Abused: No    Sexually Abused: No    SUBJECTIVE  Review of Systems Constitutional: Patient denies any unintentional weight loss or change in strength lntegumentary: Patient denies any rashes or pruritus Cardiovascular: Patient denies chest pain or syncope Respiratory: Patient denies shortness of breath Gastrointestinal: Patient denies nausea, vomiting, constipation, or diarrhea  Musculoskeletal: Patient denies muscle cramps or weakness Neurologic: Patient denies convulsions or seizures Allergic/Immunologic: Patient denies recent allergic reaction(s) Hematologic/Lymphatic: Patient denies bleeding tendencies Endocrine: Patient denies heat/cold intolerance  GU: As per HPI.  OBJECTIVE Vitals:   04/28/24 0904  BP: 102/67  Pulse: 71   There is no height or weight on file to calculate BMI.  Physical Examination Constitutional: No obvious distress; patient is non-toxic appearing  Cardiovascular: No visible lower extremity edema.   Respiratory: The patient does not have audible wheezing/stridor; respirations do not appear labored  Gastrointestinal: Abdomen non-distended Musculoskeletal: Normal ROM of UEs  Skin: No obvious rashes/open sores  Neurologic: CN 2-12 grossly intact Psychiatric: Answered questions appropriately with normal affect  Hematologic/Lymphatic/Immunologic: No obvious bruises or sites of spontaneous bleeding   ASSESSMENT Benign prostatic hyperplasia with incomplete bladder emptying - Plan: Bladder Voiding Trial, ciprofloxacin  (CIPRO ) tablet 500 mg  S/P TURP -  Plan: Bladder Voiding Trial, ciprofloxacin  (CIPRO ) tablet 500 mg  Postop check - Plan: Bladder Voiding Trial, ciprofloxacin  (CIPRO ) tablet 500 mg  We reviewed the operative procedures and findings.  Passed voiding trial. Foley catheter to remain out.  We agreed to plan for follow up in 3 months or sooner if needed. Patient verbalized understanding of and agreement with current plan. All questions were answered.  PLAN Advised the following: Foley catheter discontinued. Return in about 3 months (around 07/29/2024) for BPH, with UA & PVR.   Orders Placed This Encounter  Procedures   Bladder Voiding Trial    It has been explained that the patient is to follow regularly with their PCP in addition to all other providers involved in their care and to follow instructions provided by these respective offices. Patient advised to contact urology clinic if any urologic-pertaining questions, concerns, new symptoms or problems arise in the interim period.  There are no Patient Instructions on file for this visit.  Electronically signed by:  Lauraine KYM Oz, MSN, FNP-C, CUNP 04/28/2024 9:41 AM

## 2024-04-26 NOTE — Anesthesia Postprocedure Evaluation (Signed)
 Anesthesia Post Note  Patient: QUINLIN CONANT  Procedure(s) Performed: TURP (TRANSURETHRAL RESECTION OF PROSTATE) (Prostate)  Patient location during evaluation: Phase II Anesthesia Type: General Level of consciousness: awake Pain management: pain level controlled Vital Signs Assessment: post-procedure vital signs reviewed and stable Respiratory status: spontaneous breathing and respiratory function stable Cardiovascular status: blood pressure returned to baseline and stable Postop Assessment: no headache and no apparent nausea or vomiting Anesthetic complications: no Comments: Late entry   No notable events documented.   Last Vitals:  Vitals:   04/22/24 0502 04/22/24 0837  BP: (!) 97/59 (!) 81/51  Pulse: 65 69  Resp: 18 18  Temp: 36.9 C 37.3 C  SpO2: 96% 96%    Last Pain:  Vitals:   04/22/24 1437  TempSrc:   PainSc: 0-No pain                 Yvonna JINNY Bosworth

## 2024-04-28 ENCOUNTER — Ambulatory Visit (INDEPENDENT_AMBULATORY_CARE_PROVIDER_SITE_OTHER): Admitting: Urology

## 2024-04-28 ENCOUNTER — Encounter: Payer: Self-pay | Admitting: Urology

## 2024-04-28 VITALS — BP 102/67 | HR 71

## 2024-04-28 DIAGNOSIS — R3914 Feeling of incomplete bladder emptying: Secondary | ICD-10-CM | POA: Diagnosis not present

## 2024-04-28 DIAGNOSIS — Z09 Encounter for follow-up examination after completed treatment for conditions other than malignant neoplasm: Secondary | ICD-10-CM

## 2024-04-28 DIAGNOSIS — R338 Other retention of urine: Secondary | ICD-10-CM

## 2024-04-28 DIAGNOSIS — Z9079 Acquired absence of other genital organ(s): Secondary | ICD-10-CM

## 2024-04-28 DIAGNOSIS — N401 Enlarged prostate with lower urinary tract symptoms: Secondary | ICD-10-CM | POA: Diagnosis not present

## 2024-04-28 MED ORDER — CIPROFLOXACIN HCL 500 MG PO TABS
500.0000 mg | ORAL_TABLET | Freq: Once | ORAL | Status: AC
  Administered 2024-04-28: 500 mg via ORAL

## 2024-04-28 NOTE — Progress Notes (Signed)
 Fill and Pull Catheter Removal  Patient is present today for a catheter removal.  100 ml of sterile water  was instilled into the bladder when the patient felt the urge to urinate. 25 ml of water  was then drained from the balloon.  A 18 FR 3 way foley cath was removed from the bladder no complications were noted .  Foley catheter intact and time of removal. Patient as then given some time to void on their own.  Patient can void  100 ml on their own after some time.  Patient tolerated well.  One oral prophylactic antibiotic given per MD orders  Performed by: Omar [name], CMA  Follow up/ Additional notes: N/A

## 2024-04-28 NOTE — Addendum Note (Signed)
 Addended by: GRETTA MASTERS R on: 04/28/2024 10:01 AM   Modules accepted: Orders

## 2024-04-29 ENCOUNTER — Telehealth: Payer: Self-pay

## 2024-04-29 LAB — URINALYSIS, ROUTINE W REFLEX MICROSCOPIC
Bilirubin, UA: NEGATIVE
Glucose, UA: NEGATIVE
Ketones, UA: NEGATIVE
Nitrite, UA: NEGATIVE
Specific Gravity, UA: <1.005 — ABNORMAL LOW (ref 1.005–1.030)
Urobilinogen, Ur: 0.2 mg/dL (ref 0.2–1.0)
pH, UA: 6 (ref 5.0–7.5)

## 2024-04-29 LAB — MICROSCOPIC EXAMINATION

## 2024-04-29 NOTE — Telephone Encounter (Signed)
 Patient's wife states ua was obtained during voiding trial yesterday.  Patient is still having painful urination and doesn't feel like he is emptying his bladder completely.  I offered appt to do a PVR and wife declined at this time.  She would like for you to review ua and send in antibiotic to treat symptoms.

## 2024-04-29 NOTE — Telephone Encounter (Signed)
 Patient's wife informed of NP's response.  She would like patient to come in for a ua/uc.  Added to lab for ua-  Will confirm with MD tomorrow.

## 2024-04-30 ENCOUNTER — Other Ambulatory Visit: Payer: Self-pay

## 2024-04-30 ENCOUNTER — Other Ambulatory Visit

## 2024-04-30 DIAGNOSIS — R399 Unspecified symptoms and signs involving the genitourinary system: Secondary | ICD-10-CM

## 2024-04-30 LAB — MICROSCOPIC EXAMINATION: WBC, UA: >30 /HPF — ABNORMAL HIGH (ref 0–5)

## 2024-04-30 LAB — URINALYSIS, ROUTINE W REFLEX MICROSCOPIC
Bilirubin, UA: NEGATIVE
Glucose, UA: NEGATIVE
Ketones, UA: NEGATIVE
Nitrite, UA: NEGATIVE
Specific Gravity, UA: 1.025 (ref 1.005–1.030)
Urobilinogen, Ur: 0.2 mg/dL (ref 0.2–1.0)
pH, UA: 6 (ref 5.0–7.5)

## 2024-04-30 MED ORDER — NITROFURANTOIN MONOHYD MACRO 100 MG PO CAPS: 100.0000 mg | ORAL_CAPSULE | Freq: Two times a day (BID) | ORAL | 0 refills | Status: DC

## 2024-04-30 NOTE — Telephone Encounter (Signed)
 Verbal from Dr. Sherrilee to send in Macrobid  100mg  BID for 7 days.  Patient will come in for culture and aware that we will call with results.

## 2024-05-01 NOTE — Discharge Summary (Signed)
 Physician Discharge Summary  Patient ID: Omar [name] MRN: [id number] DOB/AGE: 80-20-1947 80 y.o.  Admit date: 04/21/2024 Discharge date: 04/22/2024  Admission Diagnoses:  BPH (benign prostatic hyperplasia)  Discharge Diagnoses:  Principal Problem:   BPH (benign prostatic hyperplasia) Active Problems:   Benign prostatic hyperplasia with urinary obstruction   Past Medical History:  Diagnosis Date   Abnormal EKG    Inferior Q waves   BPH (benign prostatic hyperplasia)    Chronic anticoagulation 01/16/2017   Coronary artery disease    Degenerative joint disease    Diabetes mellitus, type 2 (HCC)    No insulin    DVT (deep venous thrombosis) (HCC) 4 -5 yrs ago   DVT of leg (deep venous thrombosis) (HCC) 07/02/2015   Heart murmur    Hepatic steatosis    Heterozygous factor V Leiden mutation (HCC) 01/16/2017   Hyperlipidemia    Hypertension    Echo in 2008-technically limited, mild LVH, normal EF; 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 Milton interpreted the study-CPAP recommended, but refused by patient   Stroke [facility])    no deficits from stroke, Didnt know I had one when they say I did.    Surgeries: Procedure(s): TURP (TRANSURETHRAL RESECTION OF PROSTATE) on 04/21/2024   Consultants (if any):   Discharged Condition: Improved  Hospital Course: Omar [name] is an 78 y.o. male who was admitted 04/21/2024 with a diagnosis of BPH (benign prostatic hyperplasia) and went to the operating room on 04/21/2024 and underwent the above named procedures.    He was given perioperative antibiotics:  Anti-infectives (From admission, onward)    Start     Dose/Rate Route Frequency Ordered Stop   04/21/24 1127  ceFAZolin  (ANCEF ) 2-4 GM/100ML-% IVPB       Note to Pharmacy: Dario Stabs S:  cabinet override      04/21/24 1127 04/21/24 1303   04/21/24 1035  ceFAZolin  (ANCEF ) IVPB 2g/100 mL premix        2 g 200 mL/hr over 30 Minutes Intravenous 30 min pre-op 04/21/24 1035 04/21/24 1323     .  He was given sequential compression devices, early ambulation for DVT prophylaxis.  He benefited maximally from the hospital stay and there were no complications.    Inpatient Morphine  Milligram Equivalents Per Day 7/21 - 7/22   Values displayed are in units of MME/Day    Order Start / End Date 7/21 7/22    oxyCODONE  (Oxy IR/ROXICODONE ) immediate release tablet 5 mg 7/21 - 7/21 7.5 of Unknown --    oxyCODONE  (ROXICODONE ) 5 MG/5ML solution 5 mg 7/21 - 7/21 0 of Unknown --      Group total: 7.5 of Unknown     fentaNYL  (SUBLIMAZE ) injection 25-50 mcg 7/21 - 7/21 15 of 45-90 --    fentaNYL  (SUBLIMAZE ) injection 7/21 - 7/21 *30 of 30 --    fentaNYL  (SUBLIMAZE ) injection 25-50 mcg 7/21 - 7/22 0 of 67.5-135 0 of 120-240    oxyCODONE  (Oxy IR/ROXICODONE ) immediate release tablet 5 mg 7/21 - 7/22 7.5 of 22.5 0 of 30    Daily Totals  * 60 of Unknown (at least 165-277.5) 0 of 150-270  *One-Step medication  Calculation Errors     Order Type Date Details   oxyCODONE  (Oxy IR/ROXICODONE )  immediate release tablet 5 mg Ordered Dose -- Insufficient frequency information   oxyCODONE  (ROXICODONE ) 5 MG/5ML solution 5 mg Ordered Dose -- Insufficient frequency information            Recent vital signs:  Vitals:   04/22/24 0502 04/22/24 0837  BP: (!) 97/59 (!) 81/51  Pulse: 65 69  Resp: 18 18  Temp: 98.4 F (36.9 C) 99.1 F (37.3 C)  SpO2: 96% 96%    Recent laboratory studies:  Lab Results  Component Value Date   HGB 13.2 04/22/2024   HGB 13.0 04/21/2024   HGB 13.2 03/24/2024   Lab Results  Component Value Date   WBC 11.1 (H) 04/22/2024   PLT 206 04/22/2024   Lab Results  Component Value Date   INR 1.6 (H) 02/29/2024   Lab Results  Component Value Date   NA 136 04/22/2024    K 4.5 04/22/2024   CL 101 04/22/2024   CO2 25 04/22/2024   BUN 15 04/22/2024   CREATININE 0.79 04/22/2024   GLUCOSE 119 (H) 04/22/2024    Discharge Medications:   Allergies as of 04/22/2024       Reactions   Empagliflozin  Other (See Comments)   Cause UTI/Sepsis        Medication List     STOP taking these medications    silodosin  8 MG Caps capsule Commonly known as: RAPAFLO        TAKE these medications    acebutolol  200 MG capsule Commonly known as: SECTRAL  Take 1 capsule (200 mg total) by mouth 2 (two) times daily.   acetaminophen  500 MG tablet Commonly known as: TYLENOL  Take 1,000 mg by mouth every 6 (six) hours as needed.   apixaban  5 MG Tabs tablet Commonly known as: ELIQUIS  Take 5 mg by mouth 2 (two) times daily.   HYDROcodone -acetaminophen  5-325 MG tablet Commonly known as: NORCO/VICODIN Take 1 tablet by mouth every 4 (four) hours as needed for moderate pain (pain score 4-6) or severe pain (pain score 7-10).   lisinopril  5 MG tablet Commonly known as: ZESTRIL  TAKE (1) TABLET BY MOUTH ONCE DAILY.   metFORMIN  500 MG tablet Commonly known as: GLUCOPHAGE  Take 1 tablet (500 mg total) by mouth 2 (two) times daily with a meal. What changed: how much to take   nitroGLYCERIN  0.4 MG SL tablet Commonly known as: NITROSTAT  PLACE 1 TAB UNDER TONGUE EVERY 5 MIN IF NEEDED FOR CHEST PAIN. MAY USE 3 TIMES.NO RELIEF CALL 911.   ONE-A-DAY 50 PLUS PO Take 1 tablet by mouth at bedtime.   rosuvastatin  40 MG tablet Commonly known as: CRESTOR  Take 1 tablet (40 mg total) by mouth daily. What changed: when to take this   spironolactone  50 MG tablet Commonly known as: ALDACTONE  TAKE 1 TABLET BY MOUTH EVERY MORNING   torsemide  20 MG tablet Commonly known as: DEMADEX  Take 1 tablet (20 mg total) by mouth daily. What changed:  when to take this reasons to take this   verapamil  120 MG CR tablet Commonly known as: CALAN -SR Take 1 tablet (120 mg total) by mouth  daily with breakfast.        Diagnostic Studies: No results found.  Disposition: Discharge disposition: 01-Home or Self Care       Discharge Instructions     Discharge patient   Complete by: As directed    Discharge disposition: 01-Home or Self Care   Discharge patient date: 04/22/2024   Discharge patient   Complete by: As directed  Discharge disposition: 01-Home or Self Care   Discharge patient date: 04/22/2024        Follow-up Information     Jahrel Borthwick, Belvie CROME, MD. Call in 1 week(s).   Specialty: Urology Contact information: [address]  [place] [address] [phone]                  Signed: [name] 05/01/2024, 1:57 PM

## 2024-05-02 ENCOUNTER — Ambulatory Visit: Admitting: Urology

## 2024-05-05 ENCOUNTER — Ambulatory Visit: Payer: Self-pay | Admitting: Urology

## 2024-05-05 ENCOUNTER — Telehealth: Payer: Self-pay

## 2024-05-05 LAB — URINE CULTURE

## 2024-05-05 NOTE — Telephone Encounter (Signed)
 Patient's wife called to follow up on culture results.  She was made aware of NP's response to culture and to complete the abx.  Patient aware to call back if symptoms do not improve.

## 2024-05-06 ENCOUNTER — Ambulatory Visit (HOSPITAL_COMMUNITY)
Admission: RE | Admit: 2024-05-06 | Discharge: 2024-05-06 | Disposition: A | Discharge status: Home / Self Care | Source: Ambulatory Visit | Attending: Orthopedic Surgery | Admitting: Orthopedic Surgery

## 2024-05-06 DIAGNOSIS — M720 Palmar fascial fibromatosis [Dupuytren]: Secondary | ICD-10-CM | POA: Diagnosis present

## 2024-05-09 ENCOUNTER — Ambulatory Visit

## 2024-05-13 ENCOUNTER — Other Ambulatory Visit: Admitting: Urology

## 2024-05-19 ENCOUNTER — Ambulatory Visit (INDEPENDENT_AMBULATORY_CARE_PROVIDER_SITE_OTHER)

## 2024-05-19 DIAGNOSIS — E291 Testicular hypofunction: Secondary | ICD-10-CM | POA: Diagnosis not present

## 2024-05-19 MED ORDER — TESTOSTERONE CYPIONATE 200 MG/ML IM SOLN
200.0000 mg | Freq: Once | INTRAMUSCULAR | Status: AC
  Administered 2024-05-19: 200 mg via INTRAMUSCULAR

## 2024-05-19 NOTE — Progress Notes (Addendum)
 Patient receiving IM injection per MD order  The injection site was cleaned and prepped with alcohol. A band aid applied after injection given.   IM injection  Medication: Testosterone  Dose: 0.5 ML Location: left Ventrogluteal Lot: [id number] Exp: 05/02/2025  Patient tolerated well, no complications were noted  Performed by: Carlos, CMA

## 2024-05-20 ENCOUNTER — Encounter: Admitting: Urology

## 2024-05-28 NOTE — Progress Notes (Incomplete)
 History of Present Illness: Omar [name] is here for postop check following TURP by Dr Sherrilee on 7.21.2025. @9  gms of tissue resected, path==> BPH.  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 Milton interpreted the study-CPAP recommended, but refused by patient   Stroke [facility])    no deficits from stroke, Didnt know I had one when they say I did.    Past Surgical History:  Procedure Laterality Date   BIOPSY  04/16/2019   Procedure: BIOPSY;  Surgeon: Harvey Margo CROME, MD;  [facility];  Service: Endoscopy;;   CATARACT EXTRACTION W/PHACO Left 04/03/2016   Procedure: CATARACT EXTRACTION PHACO AND INTRAOCULAR LENS PLACEMENT LEFT EYE CDE=21.76;  Surgeon: Dow JULIANNA Burke, MD;  Location: [facility] ORS;  Service: Ophthalmology;  Laterality: Left;   CATARACT EXTRACTION W/PHACO Right 06/19/2016   Procedure: CATARACT EXTRACTION PHACO AND INTRAOCULAR LENS PLACEMENT; CDE:  11.56;  Surgeon: Dow JULIANNA Burke, MD;  Location: [facility];  Service: Ophthalmology;  Laterality: Right;   CHOLECYSTECTOMY N/A 03/03/2024   Procedure: LAPAROSCOPIC CHOLECYSTECTOMY;  Surgeon: Rubin Calamity, MD;  Location: [facility] OR;  Service: General;  Laterality: N/A;  with indocyanine green    COLONOSCOPY  N/A 04/16/2019   Procedure: COLONOSCOPY;  Surgeon: Harvey Margo CROME, MD;  [facility];  Service: Endoscopy;  Laterality: N/A;  8:30am   COLONOSCOPY W/ POLYPECTOMY  05/2008   polypectomy x4-adenomatous; internal hemorrhoids   CORONARY ANGIOPLASTY WITH STENT PLACEMENT  06/04/2012     DES    to [place]   CORONARY STENT PLACEMENT     x 1   DUPUYTREN / PALMAR FASCIOTOMY  2009   rt hand-dsc-went home same day x2   ERCP N/A 02/29/2024   Procedure: ERCP, WITH INTERVENTION IF INDICATED;  Surgeon: Charlanne Groom, MD;  Location: [facility] ENDOSCOPY;  Service: Gastroenterology;  Laterality: N/A;   ESOPHAGOGASTRODUODENOSCOPY N/A 04/16/2019   Procedure: ESOPHAGOGASTRODUODENOSCOPY (EGD);  Surgeon: Harvey Margo CROME, MD;  [facility];  Service: Endoscopy;  Laterality: N/A;   LEFT HEART CATHETERIZATION WITH CORONARY ANGIOGRAM N/A 05/28/2012   Procedure: LEFT HEART CATHETERIZATION WITH CORONARY ANGIOGRAM;  Surgeon: Erick JONELLE Bergamo, MD;  Location: [facility] CATH LAB;  Service: Cardiovascular;  Laterality: N/A;   PERCUTANEOUS CORONARY STENT INTERVENTION (PCI-S) N/A 06/04/2012   Procedure: PERCUTANEOUS CORONARY STENT INTERVENTION (PCI-S);  Surgeon: Erick JONELLE Bergamo, MD;  Location: [facility] CATH LAB;  Service: Cardiovascular;  Laterality: N/A;   POLYPECTOMY  04/16/2019   Procedure: POLYPECTOMY;  Surgeon: Harvey Margo CROME, MD;  [facility];  Service: Endoscopy;;   TONSILLECTOMY     TOTAL HIP ARTHROPLASTY Right 06/24/2019   Procedure: TOTAL HIP  ARTHROPLASTY;  Surgeon: Josefina Chew, MD;  Location: [facility];  Service: Orthopedics;  Laterality: Right;   TRANSURETHRAL RESECTION OF PROSTATE N/A 04/21/2024   Procedure: TURP (TRANSURETHRAL RESECTION OF PROSTATE);  Surgeon: Sherrilee Belvie CROME, MD;  Location: [facility];  Service: Urology;  Laterality: N/A;   VENTRAL HERNIA REPAIR  2010   umb hernia-[facility]-went home same day    Home Medications:  Allergies as of 05/29/2024       Reactions   Empagliflozin  Other (See Comments)    Cause UTI/Sepsis        Medication List        Accurate as of May 28, 2024  7:13 PM. If you have any questions, ask your nurse or doctor.          acebutolol  200 MG capsule Commonly known as: SECTRAL  Take 1 capsule (200 mg total) by mouth 2 (two) times daily.   acetaminophen  500 MG tablet Commonly known as: TYLENOL  Take 1,000 mg by mouth every 6 (six) hours as needed.   apixaban  5 MG Tabs tablet Commonly known as: ELIQUIS  Take 5 mg by mouth 2 (two) times daily.   HYDROcodone -acetaminophen  5-325 MG tablet Commonly known as: NORCO/VICODIN Take 1 tablet by mouth every 4 (four) hours as needed for moderate pain (pain score 4-6) or severe pain (pain score 7-10).   lisinopril  5 MG tablet Commonly known as: ZESTRIL  TAKE (1) TABLET BY MOUTH ONCE DAILY.   metFORMIN  500 MG tablet Commonly known as: GLUCOPHAGE  Take 1 tablet (500 mg total) by mouth 2 (two) times daily with a meal. What changed: how much to take   nitrofurantoin  (macrocrystal-monohydrate) 100 MG capsule Commonly known as: MACROBID  Take 1 capsule (100 mg total) by mouth every 12 (twelve) hours.   nitroGLYCERIN  0.4 MG SL tablet Commonly known as: NITROSTAT  PLACE 1 TAB UNDER TONGUE EVERY 5 MIN IF NEEDED FOR CHEST PAIN. MAY USE 3 TIMES.NO RELIEF CALL 911.   ONE-A-DAY 50 PLUS PO Take 1 tablet by mouth at bedtime.   rosuvastatin  40 MG tablet Commonly known as: CRESTOR  Take 1 tablet (40 mg total) by mouth daily. What changed: when to take this   silodosin  8 MG Caps capsule Commonly known as: RAPAFLO  Take 8 mg by mouth daily with breakfast.   spironolactone  50 MG tablet Commonly known as: ALDACTONE  TAKE 1 TABLET BY MOUTH EVERY MORNING   torsemide  20 MG tablet Commonly known as: DEMADEX  Take 1 tablet (20 mg total) by mouth daily. What changed:  when to take this reasons to take this   verapamil  120 MG CR tablet Commonly known as: CALAN -SR Take 1 tablet (120 mg total) by mouth daily with  breakfast.        Allergies:  Allergies  Allergen Reactions   Empagliflozin  Other (See Comments)    Cause UTI/Sepsis    Family History  Problem Relation Age of Onset   Cancer Mother    Brain cancer Mother    Cancer Father    Prostate cancer Father    Breast cancer Daughter    Thyroid  cancer Daughter    Coronary artery disease Neg Hx    Colon cancer Neg Hx     Social History:  reports that he has never smoked. He has never been exposed to tobacco smoke. He has never used smokeless tobacco. He reports that he does not drink alcohol and does not use drugs.  ROS: A complete review of systems was performed.  All systems are negative except for pertinent findings as noted.  Physical Exam:  Vital signs in last 24 hours: There were no vitals taken for this visit. Constitutional:  Alert and oriented, No acute distress Cardiovascular: Regular rate  Respiratory: Normal respiratory effort GI: Abdomen is soft, nontender, nondistended, no abdominal masses. No CVAT.  Genitourinary: Normal male phallus, testes are descended bilaterally and non-tender and without masses, scrotum is normal in appearance without lesions or masses, perineum is normal on inspection. Lymphatic: No lymphadenopathy Neurologic: Grossly intact, no focal deficits Psychiatric: Normal mood and affect  I have reviewed prior pt notes  I have reviewed notes from referring/previous physicians  I have reviewed urinalysis results  I have independently reviewed prior imaging  I have reviewed prior PSA results  I have reviewed prior urine culture   Impression/Assessment:  ***  Plan:  ***

## 2024-05-29 ENCOUNTER — Ambulatory Visit (INDEPENDENT_AMBULATORY_CARE_PROVIDER_SITE_OTHER): Admitting: Urology

## 2024-05-29 VITALS — BP 117/72 | HR 65

## 2024-05-29 DIAGNOSIS — R338 Other retention of urine: Secondary | ICD-10-CM

## 2024-05-29 DIAGNOSIS — N401 Enlarged prostate with lower urinary tract symptoms: Secondary | ICD-10-CM

## 2024-05-29 DIAGNOSIS — E291 Testicular hypofunction: Secondary | ICD-10-CM

## 2024-05-29 DIAGNOSIS — Z9079 Acquired absence of other genital organ(s): Secondary | ICD-10-CM

## 2024-05-29 LAB — MICROSCOPIC EXAMINATION: RBC, Urine: >30 /HPF — AB (ref 0–2)

## 2024-05-29 LAB — URINALYSIS, ROUTINE W REFLEX MICROSCOPIC
Bilirubin, UA: NEGATIVE
Ketones, UA: NEGATIVE
Nitrite, UA: NEGATIVE
Specific Gravity, UA: 1.025 (ref 1.005–1.030)
Urobilinogen, Ur: 0.2 mg/dL (ref 0.2–1.0)
pH, UA: 6 (ref 5.0–7.5)

## 2024-05-29 NOTE — Progress Notes (Signed)
 History of Present Illness: Here for follow-up of TURP performed by Dr. Sherrilee on 04/21/2024.  29 g of tissue resected, all benign.  He was treated for an enterococcal UTI at his follow-up.  Currently, no complaints.  No dysuria, no gross hematuria.  Great urinary stream.  Still comes in regularly for testosterone  injections.    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 Milton interpreted the study-CPAP recommended, but refused by patient   Stroke [facility])    no deficits from stroke, Didnt know I had one when they say I did.    Past Surgical History:  Procedure Laterality Date   BIOPSY  04/16/2019   Procedure: BIOPSY;  Surgeon: Harvey Margo CROME, MD;  [facility];  Service: Endoscopy;;   CATARACT EXTRACTION W/PHACO Left 04/03/2016   Procedure: CATARACT EXTRACTION PHACO AND INTRAOCULAR LENS PLACEMENT LEFT EYE CDE=21.76;  Surgeon: Dow JULIANNA Burke, MD;  Location: [facility];  Service: Ophthalmology;  Laterality: Left;   CATARACT EXTRACTION W/PHACO Right 06/19/2016   Procedure: CATARACT EXTRACTION PHACO AND INTRAOCULAR LENS PLACEMENT; CDE:  11.56;  Surgeon: Dow JULIANNA Burke, MD;  Location: [facility];  Service: Ophthalmology;  Laterality: Right;    CHOLECYSTECTOMY N/A 03/03/2024   Procedure: LAPAROSCOPIC CHOLECYSTECTOMY;  Surgeon: Rubin Calamity, MD;  Location: [facility] OR;  Service: General;  Laterality: N/A;  with indocyanine green    COLONOSCOPY N/A 04/16/2019   Procedure: COLONOSCOPY;  Surgeon: Harvey Margo CROME, MD;  [facility];  Service: Endoscopy;  Laterality: N/A;  8:30am   COLONOSCOPY W/ POLYPECTOMY  05/2008   polypectomy x4-adenomatous; internal hemorrhoids   CORONARY ANGIOPLASTY WITH STENT PLACEMENT  06/04/2012     DES    to RI   CORONARY STENT PLACEMENT     x 1   DUPUYTREN / PALMAR FASCIOTOMY  2009   rt hand-dsc-went home same day x2   ERCP N/A 02/29/2024   Procedure: ERCP, WITH INTERVENTION IF INDICATED;  Surgeon: Charlanne Groom, MD;  Location: [facility] ENDOSCOPY;  Service: Gastroenterology;  Laterality: N/A;   ESOPHAGOGASTRODUODENOSCOPY N/A 04/16/2019   Procedure: ESOPHAGOGASTRODUODENOSCOPY (EGD);  Surgeon: Harvey Margo CROME, MD;  [facility];  Service: Endoscopy;  Laterality: N/A;   LEFT HEART CATHETERIZATION WITH CORONARY ANGIOGRAM N/A 05/28/2012   Procedure: LEFT HEART CATHETERIZATION WITH CORONARY ANGIOGRAM;  Surgeon: Erick JONELLE Bergamo, MD;  Location: [facility] CATH LAB;  Service: Cardiovascular;  Laterality: N/A;   PERCUTANEOUS CORONARY STENT INTERVENTION (PCI-S) N/A 06/04/2012   Procedure: PERCUTANEOUS CORONARY STENT INTERVENTION (PCI-S);  Surgeon: Erick JONELLE Bergamo, MD;  Location: [facility] CATH LAB;  Service: Cardiovascular;  Laterality: N/A;   POLYPECTOMY  04/16/2019  Procedure: POLYPECTOMY;  Surgeon: Harvey Margo CROME, MD;  [facility];  Service: Endoscopy;;   TONSILLECTOMY     TOTAL HIP ARTHROPLASTY Right 06/24/2019   Procedure: TOTAL HIP ARTHROPLASTY;  Surgeon: Josefina Chew, MD;  Location: [facility];  Service: Orthopedics;  Laterality: Right;   TRANSURETHRAL RESECTION OF PROSTATE N/A 04/21/2024   Procedure: TURP (TRANSURETHRAL RESECTION OF PROSTATE);  Surgeon: Sherrilee Belvie CROME, MD;  Location: [facility];  Service:  Urology;  Laterality: N/A;   VENTRAL HERNIA REPAIR  2010   umb hernia-[facility]-went home same day    Home Medications:  Allergies as of 05/29/2024       Reactions   Empagliflozin  Other (See Comments)   Cause UTI/Sepsis        Medication List        Accurate as of May 29, 2024  1:16 PM. If you have any questions, ask your nurse or doctor.          acebutolol  200 MG capsule Commonly known as: SECTRAL  Take 1 capsule (200 mg total) by mouth 2 (two) times daily.   acetaminophen  500 MG tablet Commonly known as: TYLENOL  Take 1,000 mg by mouth every 6 (six) hours as needed.   apixaban  5 MG Tabs tablet Commonly known as: ELIQUIS  Take 5 mg by mouth 2 (two) times daily.   HYDROcodone -acetaminophen  5-325 MG tablet Commonly known as: NORCO/VICODIN Take 1 tablet by mouth every 4 (four) hours as needed for moderate pain (pain score 4-6) or severe pain (pain score 7-10).   lisinopril  5 MG tablet Commonly known as: ZESTRIL  TAKE (1) TABLET BY MOUTH ONCE DAILY.   metFORMIN  500 MG tablet Commonly known as: GLUCOPHAGE  Take 1 tablet (500 mg total) by mouth 2 (two) times daily with a meal. What changed: how much to take   nitrofurantoin  (macrocrystal-monohydrate) 100 MG capsule Commonly known as: MACROBID  Take 1 capsule (100 mg total) by mouth every 12 (twelve) hours.   nitroGLYCERIN  0.4 MG SL tablet Commonly known as: NITROSTAT  PLACE 1 TAB UNDER TONGUE EVERY 5 MIN IF NEEDED FOR CHEST PAIN. MAY USE 3 TIMES.NO RELIEF CALL 911.   ONE-A-DAY 50 PLUS PO Take 1 tablet by mouth at bedtime.   rosuvastatin  40 MG tablet Commonly known as: CRESTOR  Take 1 tablet (40 mg total) by mouth daily. What changed: when to take this   silodosin  8 MG Caps capsule Commonly known as: RAPAFLO  Take 8 mg by mouth daily with breakfast.   spironolactone  50 MG tablet Commonly known as: ALDACTONE  TAKE 1 TABLET BY MOUTH EVERY MORNING   torsemide  20 MG tablet Commonly known as: DEMADEX  Take 1 tablet  (20 mg total) by mouth daily. What changed:  when to take this reasons to take this   verapamil  120 MG CR tablet Commonly known as: CALAN -SR Take 1 tablet (120 mg total) by mouth daily with breakfast.        Allergies:  Allergies  Allergen Reactions   Empagliflozin  Other (See Comments)    Cause UTI/Sepsis    Family History  Problem Relation Age of Onset   Cancer Mother    Brain cancer Mother    Cancer Father    Prostate cancer Father    Breast cancer Daughter    Thyroid  cancer Daughter    Coronary artery disease Neg Hx    Colon cancer Neg Hx     Social History:  reports that he has never smoked. He has never been exposed to tobacco smoke. He has never used smokeless tobacco.  He reports that he does not drink alcohol and does not use drugs.  ROS: A complete review of systems was performed.  All systems are negative except for pertinent findings as noted.   I have reviewed prior pt notes  I have reviewed op note  I have reviewed urinalysis results  Pathology results reviewed   Impression/Assessment:  1.  BPH with retention, status post TURP, voiding well  2.  Low testosterone , on repletion  Plan:  1.  He will keep his appointment in November  2.  Continue injections every 2 weeks

## 2024-05-30 ENCOUNTER — Telehealth: Payer: Self-pay

## 2024-05-30 NOTE — Telephone Encounter (Signed)
 Patient's wife called to see if a culture was sent off yesterday due to abnormal UA results received over mychart.  I informed her that per MD note patient was not symptomatic or having any urinary issues at the time of visit so culture was not sent.  She is aware that if patient develops symptoms to call our office and he can be scheduled for a visit.  Patient's wife voiced understanding.

## 2024-06-03 ENCOUNTER — Ambulatory Visit

## 2024-06-03 DIAGNOSIS — E291 Testicular hypofunction: Secondary | ICD-10-CM

## 2024-06-03 MED ORDER — TESTOSTERONE CYPIONATE 200 MG/ML IM SOLN
200.0000 mg | Freq: Once | INTRAMUSCULAR | Status: AC
  Administered 2024-06-03: 200 mg via INTRAMUSCULAR

## 2024-06-03 NOTE — Progress Notes (Signed)
 Patient receiving Testosterone  injection per MD order  The injection site was cleaned and prepped with alcohol. A band aid applied after injection given.   IM injection  Medication: Testosterone  Dose: 200 ML/0.5 ML Location: Right Gluteal Lot: [id number] Exp: 05/2025  Patient tolerated well, no complications were noted  Performed by: Carlos, CMA

## 2024-06-13 ENCOUNTER — Other Ambulatory Visit: Payer: Self-pay

## 2024-06-13 DIAGNOSIS — R339 Retention of urine, unspecified: Secondary | ICD-10-CM

## 2024-06-17 ENCOUNTER — Ambulatory Visit

## 2024-06-17 DIAGNOSIS — E291 Testicular hypofunction: Secondary | ICD-10-CM

## 2024-06-17 DIAGNOSIS — N5201 Erectile dysfunction due to arterial insufficiency: Secondary | ICD-10-CM

## 2024-06-17 MED ORDER — SILODOSIN 8 MG PO CAPS: 8.0000 mg | ORAL_CAPSULE | Freq: Every day | ORAL | 3 refills | Status: AC

## 2024-06-17 NOTE — Progress Notes (Addendum)
 Patient receiving Testerone injection per MD order  The injection site was cleaned and prepped with alcohol. A band aid applied after injection given.   IM injection  Medication: Testerone  Dose: 0.5 ML Location: left Gluteal Lot: [id number] Exp: 05/02/2025  Patient tolerated well, no complications were noted  Performed by: Carlos, CMA

## 2024-06-26 ENCOUNTER — Ambulatory Visit

## 2024-06-30 ENCOUNTER — Telehealth: Payer: Self-pay

## 2024-06-30 ENCOUNTER — Ambulatory Visit: Payer: Self-pay

## 2024-06-30 ENCOUNTER — Ambulatory Visit

## 2024-06-30 NOTE — Telephone Encounter (Signed)
 Copied from CRM #[id number]. Topic: Appointments - Appointment Cancel/Reschedule >> Jun 30, 2024  8:37 AM Mia F wrote: Patient/patient representative is calling to cancel or reschedule an appointment. Refer to attachments for appointment information.

## 2024-06-30 NOTE — Telephone Encounter (Signed)
 FYI Only or Action Required?: Action required by provider: clinical question for provider.  Patient was last seen in primary care on 03/28/2024 by Bevely Doffing, FNP.  Called Nurse Triage reporting Vomiting and Diarrhea.  Symptoms began yesterday.  Interventions attempted: Rest, hydration, or home remedies.  Symptoms are: unchanged.  Triage Disposition: Home Care  Patient/caregiver understands and will follow disposition?: Yes   Message from Mia F sent at 06/30/2024  8:37 AM EDT  Vomiting and diarrhea. Started overnight. Have not noticed any fever.  Not constant just here and there. [id number]   Reason for Disposition  MILD to MODERATE vomiting (e.g., 1 - 5 times / day)  Answer Assessment - Initial Assessment Questions 1. DIARRHEA SEVERITY: How bad is the diarrhea? How many more stools have you had in the past 24 hours than normal?      couple 2. ONSET: When did the diarrhea begin?      06/29/24 3. STOOL DESCRIPTION:  How loose or watery is the diarrhea? What is the stool color? Is there any blood or mucous in the stool?     diarrhea 4. VOMITING: Are you also vomiting? If Yes, ask: How many times in the past 24 hours?      *No Answer* 5. ABDOMEN PAIN: Are you having any abdomen pain? If Yes, ask: What does it feel like? (e.g., crampy, dull, intermittent, constant)      *No Answer* 6. ABDOMEN PAIN SEVERITY: If present, ask: How bad is the pain?  (e.g., Scale 1-10; mild, moderate, or severe)     *No Answer* 7. ORAL INTAKE: If vomiting, Have you been able to drink liquids? How much liquids have you had in the past 24 hours?     *No Answer* 8. HYDRATION: Any signs of dehydration? (e.g., dry mouth [not just dry lips], too weak to stand, dizziness, new weight loss) When did you last urinate?     *No Answer* 9. EXPOSURE: Have you traveled to a foreign country recently? Have you been exposed to anyone with diarrhea? Could you have eaten any food  that was spoiled?     *No Answer* 10. ANTIBIOTIC USE: Are you taking antibiotics now or have you taken antibiotics in the past 2 months?       *No Answer* 11. OTHER SYMPTOMS: Do you have any other symptoms? (e.g., fever, blood in stool)       vomiting  Answer Assessment - Initial Assessment Questions Additional info: Requesting Zofran  to [facility]. Declined appointment.  He does not check his blood sugar regularly.    1. VOMITING SEVERITY: How many times have you vomited in the past 24 hours?      4 times  2. ONSET: When did the vomiting begin?      06/29/24 3. FLUIDS: What fluids or food have you vomited up today? Have you been able to keep any fluids down?     Hasn't drank anything today.  4. ABDOMEN PAIN: Are your having any abdomen pain? If Yes : How bad is it and what does it feel like? (e.g., crampy, dull, intermittent, constant)      intermittent 5. DIARRHEA: Is there any diarrhea? If Yes, ask: How many times today?      denies 6. CONTACTS: Is there anyone else in the family with the same symptoms?      wife 7. CAUSE: What do you think is causing your vomiting?     unsure 8. HYDRATION STATUS: Any signs of dehydration? (e.g., dry mouth [  not only dry lips], too weak to stand) When did you last urinate?     Feels hydrated at this time 9. OTHER SYMPTOMS: Do you have any other symptoms? (e.g., fever, headache, vertigo, vomiting blood or coffee grounds, recent head injury)    denies  Protocols used: Diarrhea-A-AH, Vomiting-A-AH

## 2024-06-30 NOTE — Telephone Encounter (Signed)
 Pts wife states he has been drinking water , also was inquiring about an appointment for next Monday since he was unable to come this morning is it okay to schedule with you or next available provider

## 2024-06-30 NOTE — Telephone Encounter (Signed)
 Agent already canceled appt

## 2024-07-01 ENCOUNTER — Ambulatory Visit

## 2024-07-01 DIAGNOSIS — E291 Testicular hypofunction: Secondary | ICD-10-CM | POA: Diagnosis not present

## 2024-07-01 NOTE — Telephone Encounter (Signed)
 scheduled

## 2024-07-01 NOTE — Progress Notes (Signed)
 Patient receiving Testosterone  injection per MD order  The injection site was cleaned and prepped with alcohol. A band aid applied after injection given.   IM injection  Medication: Testosterone  Dose: 0.5 ML Location: right Gluteal Lot: [id number] Exp: 05/02/2025  Patient tolerated well, no complications were noted  Performed by: Carlos, CMA

## 2024-07-15 ENCOUNTER — Ambulatory Visit

## 2024-07-21 ENCOUNTER — Ambulatory Visit

## 2024-07-21 DIAGNOSIS — E291 Testicular hypofunction: Secondary | ICD-10-CM | POA: Diagnosis not present

## 2024-07-21 NOTE — Progress Notes (Addendum)
 Patient receiving Testerone  injection per MD order  The injection site was cleaned and prepped with alcohol. A band aid applied after injection given.   IM injection  Medication: Testerone Dose: 0.5ML Location: left Ventrogluteal Lot: [id number] Exp: 05/2025  Patient tolerated well, no complications were noted  Performed by: Carlos, CMA

## 2024-08-04 ENCOUNTER — Ambulatory Visit

## 2024-08-08 ENCOUNTER — Ambulatory Visit

## 2024-08-08 ENCOUNTER — Ambulatory Visit: Admitting: Urology

## 2024-08-08 DIAGNOSIS — E291 Testicular hypofunction: Secondary | ICD-10-CM | POA: Diagnosis not present

## 2024-08-08 MED ORDER — TESTOSTERONE CYPIONATE 200 MG/ML IM SOLN
200.0000 mg | Freq: Once | INTRAMUSCULAR | Status: AC
  Administered 2024-08-08: 200 mg via INTRAMUSCULAR

## 2024-08-08 NOTE — Progress Notes (Addendum)
 Patient receiving Testosterone  injection per MD order  The injection site was cleaned and prepped with alcohol. A band aid applied after injection given.   IM injection  Medication: Testosterone  Dose: 200 MG Location: right ventrogluteal Lot: [id number] Exp: 05/2025  Patient tolerated well, no complications were noted  Performed by: Carlos, CMA

## 2024-08-11 ENCOUNTER — Ambulatory Visit

## 2024-08-11 VITALS — BP 118/78 | HR 79 | Ht 68.0 in | Wt 226.0 lb

## 2024-08-11 DIAGNOSIS — E1159 Type 2 diabetes mellitus with other circulatory complications: Secondary | ICD-10-CM

## 2024-08-11 DIAGNOSIS — I25118 Atherosclerotic heart disease of native coronary artery with other forms of angina pectoris: Secondary | ICD-10-CM | POA: Diagnosis not present

## 2024-08-11 DIAGNOSIS — E039 Hypothyroidism, unspecified: Secondary | ICD-10-CM

## 2024-08-11 DIAGNOSIS — Z23 Encounter for immunization: Secondary | ICD-10-CM | POA: Diagnosis not present

## 2024-08-11 DIAGNOSIS — Z7985 Long-term (current) use of injectable non-insulin antidiabetic drugs: Secondary | ICD-10-CM

## 2024-08-11 DIAGNOSIS — E782 Mixed hyperlipidemia: Secondary | ICD-10-CM

## 2024-08-11 MED ORDER — NITROGLYCERIN 0.4 MG SL SUBL: SUBLINGUAL_TABLET | SUBLINGUAL | 3 refills | Status: AC

## 2024-08-11 NOTE — Progress Notes (Unsigned)
 Established Patient Office Visit  Subjective   Patient ID: Omar [name], male    DOB: 03-26-1946  Age: 78 y.o. MRN: [id number]  Chief Complaint  Patient presents with   Medical Management of Chronic Issues    3 month follow up    HPI Discussed the use of AI scribe software for clinical note transcription with the patient, who gave verbal consent to proceed.  History of Present Illness    Omar [name] is an 78 year old male who presents for a follow-up visit and flu shot.  Dupuytren's contracture and hand function - Scheduled for third hand surgery for Dupuytren's contracture on November 19th at the [facility] in [place] - Contracture impairs ability to open hand and perform tasks such as putting on gloves  Diabetes mellitus management - Self-administers Ozempic weekly for glycemic control - Discontinued Ozempic from May to September due to concerns about gallbladder issues; resumed to assist with weight and appetite management - Ozempic reduces appetite; increased hunger when medication effect diminishes  Post-cholecystectomy status and pancreatitis - Underwent cholecystectomy following severe pain from gallstones that migrated into the bile duct, initially suspected to be cardiac in origin - History of pancreatitis associated with gallstone disease  Hyperlipidemia management - Takes cholesterol-lowering medication - Due for fasting laboratory evaluation to assess cholesterol levels  Angina management - Requires refill of nitroglycerin  tablets after accidental loss in laundry  Nutritional intake - Consumes Equate protein drink daily, providing 30 grams of protein     Patient Active Problem List   Diagnosis Date Noted   BPH (benign prostatic hyperplasia) 04/21/2024   History of coronary artery stent placement 04/10/2024   Hypothyroidism 04/10/2024   Palmar fascial fibromatosis (dupuytren) 04/10/2024   Leg swelling 03/11/2024   Hypoalbuminemia due to  protein-calorie malnutrition 03/11/2024   Urinary retention 03/11/2024   Benign prostatic hyperplasia with urinary obstruction 03/06/2024   Choledocholithiasis 03/01/2024   RUQ pain 02/28/2024   Elevated LFTs 02/28/2024   Gallstones 02/28/2024   Mass of right lower leg 03/08/2023   Hematoma 02/28/2023   Encounter for routine adult health examination with abnormal findings 11/09/2022   Dupuytren's contracture of right hand 09/29/2022   Right leg swelling 06/22/2022   Hypotensive episode 05/19/2022    Class: Acute   Hyponatremia 05/19/2022    Class: Acute   Testosterone  deficiency in male 01/27/2022   Insomnia 11/01/2021   Primary localized osteoarthritis of right hip 06/24/2019   S/P total hip arthroplasty 06/24/2019   Positive colorectal cancer screening using Cologuard test 01/23/2019   Morbid obesity (HCC) 11/16/2017   Heterozygous factor V Leiden mutation 01/16/2017   Chronic anticoagulation 01/16/2017   Vitamin D  deficiency 01/21/2016   DVT of leg (deep venous thrombosis) (HCC) 07/02/2015   CAD (coronary artery disease), native coronary artery 06/05/2012   Postsurgical percutaneous transluminal coronary angioplasty (PTCA) status 06/05/2012   Essential hypertension    Type 2 diabetes mellitus with vascular disease (HCC)    Sleep apnea    Meningioma (HCC)    Mixed hyperlipidemia    Hepatic steatosis    Abnormal EKG     ROS    Objective:     BP 118/78   Pulse 79   Ht 5' 8 (1.727 m)   Wt 226 lb 0.6 oz (102.5 kg)   SpO2 96%   BMI 34.37 kg/m  BP Readings from Last 3 Encounters:  08/11/24 118/78  05/29/24 117/72  04/28/24 102/67   Wt Readings from Last 3  Encounters:  08/11/24 226 lb 0.6 oz (102.5 kg)  04/16/24 235 lb (106.6 kg)  03/28/24 235 lb (106.6 kg)      Physical Exam Vitals and nursing note reviewed.  Constitutional:      Appearance: Normal appearance.  HENT:     Head: Normocephalic.  Eyes:     Extraocular Movements: Extraocular movements  intact.     Pupils: Pupils are equal, round, and reactive to light.  Cardiovascular:     Rate and Rhythm: Normal rate and regular rhythm.  Pulmonary:     Effort: Pulmonary effort is normal.     Breath sounds: Normal breath sounds.  Musculoskeletal:     Cervical back: Normal range of motion and neck supple.  Neurological:     Mental Status: He is alert and oriented to person, place, and time.  Psychiatric:        Mood and Affect: Mood normal.        Thought Content: Thought content normal.      No results found for any visits on 08/11/24.  Last CBC Lab Results  Component Value Date   WBC 11.1 (H) 04/22/2024   HGB 13.2 04/22/2024   HCT 40.2 04/22/2024   MCV 92.2 04/22/2024   MCH 30.3 04/22/2024   RDW 15.0 04/22/2024   PLT 206 04/22/2024   Last metabolic panel Lab Results  Component Value Date   GLUCOSE 119 (H) 04/22/2024   NA 136 04/22/2024   K 4.5 04/22/2024   CL 101 04/22/2024   CO2 25 04/22/2024   BUN 15 04/22/2024   CREATININE 0.79 04/22/2024   GFRNONAA >60 04/22/2024   CALCIUM  8.6 (L) 04/22/2024   PHOS 2.9 08/07/2017   PROT 5.6 (L) 03/24/2024   ALBUMIN 3.7 (L) 03/24/2024   LABGLOB 1.9 03/24/2024   AGRATIO 1.8 12/29/2022   BILITOT 0.4 03/24/2024   ALKPHOS 90 03/24/2024   AST 27 03/24/2024   ALT 29 03/24/2024   ANIONGAP 10 04/22/2024   Last lipids Lab Results  Component Value Date   CHOL 159 12/29/2022   HDL 42 12/29/2022   LDLCALC 85 12/29/2022   TRIG 187 (H) 12/29/2022   CHOLHDL 3.8 12/29/2022   Last hemoglobin A1c Lab Results  Component Value Date   HGBA1C 6.3 (H) 02/28/2024   Last thyroid  functions Lab Results  Component Value Date   TSH 2.210 12/29/2022   T4TOTAL 8.1 10/08/2019   FREET4 0.88 12/29/2022   Last vitamin D  Lab Results  Component Value Date   VD25OH 40.8 12/29/2022   Last vitamin B12 and Folate Lab Results  Component Value Date   VITAMINB12 466 12/29/2022   FOLATE 18.5 12/29/2022      The ASCVD Risk score  (Arnett DK, et al., 2019) failed to calculate for the following reasons:   The 2019 ASCVD risk score is only valid for ages 52 to 99   Risk score cannot be calculated because patient has a medical history suggesting prior/existing ASCVD    Assessment & Plan:   Problem List Items Addressed This Visit       Cardiovascular and Mediastinum   Type 2 diabetes mellitus with vascular disease (HCC) - Primary   Managed with Ozempic. A1c needs rechecking as it has not been done in five months.       Relevant Medications   nitroGLYCERIN  (NITROSTAT ) 0.4 MG SL tablet   Other Relevant Orders   CMP14+EGFR   HgB A1c   Urine Microalbumin w/creat. ratio   CAD (coronary artery disease),  native coronary artery   Relevant Medications   nitroGLYCERIN  (NITROSTAT ) 0.4 MG SL tablet     Endocrine   Hypothyroidism   Recheck thyroid  labs.      Relevant Orders   TSH + free T4     Other   Mixed hyperlipidemia   Managed with cholesterol medication. Due for fasting labs to recheck cholesterol levels. - Ordered fasting labs for cholesterol levels.      Relevant Medications   nitroGLYCERIN  (NITROSTAT ) 0.4 MG SL tablet   Other Relevant Orders   CMP14+EGFR   Lipid Profile   TSH + free T4   Other Visit Diagnoses       Encounter for immunization       Relevant Orders   Flu vaccine HIGH DOSE PF(Fluzone Trivalent) (Completed)       Return in about 6 months (around 02/08/2025) for chronic follow-up with PCP.    Leita Longs, FNP

## 2024-08-14 NOTE — Assessment & Plan Note (Signed)
 Managed with cholesterol medication. Due for fasting labs to recheck cholesterol levels. - Ordered fasting labs for cholesterol levels.

## 2024-08-14 NOTE — Assessment & Plan Note (Signed)
 Recheck thyroid labs

## 2024-08-14 NOTE — Assessment & Plan Note (Signed)
 Managed with Ozempic. A1c needs rechecking as it has not been done in five months.

## 2024-08-18 DIAGNOSIS — E1159 Type 2 diabetes mellitus with other circulatory complications: Secondary | ICD-10-CM | POA: Diagnosis not present

## 2024-08-18 DIAGNOSIS — E039 Hypothyroidism, unspecified: Secondary | ICD-10-CM | POA: Diagnosis not present

## 2024-08-20 LAB — LIPID PANEL
Chol/HDL Ratio: 2.1 ratio (ref 0.0–5.0)
Cholesterol, Total: 90 mg/dL — ABNORMAL LOW (ref 100–199)
HDL: 42 mg/dL (ref >39)
LDL Chol Calc (NIH): 30 mg/dL (ref 0–99)
Triglycerides: 93 mg/dL (ref 0–149)
VLDL Cholesterol Cal: 18 mg/dL (ref 5–40)

## 2024-08-20 LAB — MICROALBUMIN / CREATININE URINE RATIO

## 2024-08-20 LAB — CMP14+EGFR
ALT: 38 IU/L (ref 0–44)
AST: 26 IU/L (ref 0–40)
Albumin: 3.8 g/dL (ref 3.8–4.8)
Alkaline Phosphatase: 72 IU/L (ref 47–123)
BUN/Creatinine Ratio: 9 — ABNORMAL LOW (ref 10–24)
BUN: 10 mg/dL (ref 8–27)
Bilirubin Total: 0.3 mg/dL (ref 0.0–1.2)
CO2: 19 mmol/L — ABNORMAL LOW (ref 20–29)
Calcium: 9.1 mg/dL (ref 8.6–10.2)
Chloride: 105 mmol/L (ref 96–106)
Creatinine, Ser: 1.17 mg/dL (ref 0.76–1.27)
Globulin, Total: 2.1 g/dL (ref 1.5–4.5)
Glucose: 134 mg/dL — ABNORMAL HIGH (ref 70–99)
Potassium: 4.6 mmol/L (ref 3.5–5.2)
Sodium: 140 mmol/L (ref 134–144)
Total Protein: 5.9 g/dL — ABNORMAL LOW (ref 6.0–8.5)
eGFR: 63 mL/min/1.73 (ref >59)

## 2024-08-20 LAB — HEMOGLOBIN A1C
Est. average glucose Bld gHb Est-mCnc: 177 mg/dL
Hgb A1c MFr Bld: 7.8 % — ABNORMAL HIGH (ref 4.8–5.6)

## 2024-08-20 LAB — SPECIMEN STATUS REPORT

## 2024-08-20 LAB — TSH+FREE T4
Free T4: 0.98 ng/dL (ref 0.82–1.77)
TSH: 1.96 u[IU]/mL (ref 0.450–4.500)

## 2024-08-22 ENCOUNTER — Ambulatory Visit

## 2024-08-22 ENCOUNTER — Telehealth: Payer: Self-pay

## 2024-08-22 NOTE — Telephone Encounter (Signed)
 Pt wife called due to pt not being available for his NV injection today pt wife states he just had surgery and needs to be rescheduled pt rescheduled for 11/25 @ 3:30PM

## 2024-08-26 ENCOUNTER — Ambulatory Visit

## 2024-08-26 DIAGNOSIS — E291 Testicular hypofunction: Secondary | ICD-10-CM | POA: Diagnosis not present

## 2024-08-26 NOTE — Progress Notes (Addendum)
 Patient receiving Testosterone   injection per MD order  The injection site was cleaned and prepped with alcohol. A band aid applied after injection given.   IM injection  Medication: Testosterone  Dose: 200 MG Location: right Ventrogluteal Lot: [id number]: [id number]  Patient tolerated well, no complications were noted  Performed by: Carlos, CMA

## 2024-09-04 DIAGNOSIS — H353112 Nonexudative age-related macular degeneration, right eye, intermediate dry stage: Secondary | ICD-10-CM | POA: Diagnosis not present

## 2024-09-05 ENCOUNTER — Telehealth: Payer: Self-pay

## 2024-09-05 NOTE — Telephone Encounter (Signed)
 Pt wife called in today to cancel upcoming testosterone  injection appointment. Pt's wife state's pt will be getting testosterone  RX from the [facility] and [facility] wants him to hold off on his testosterone  injection for 3 months  to insurance that his testosterone  levels will be low. and then the [facility] provider can write his RX  for testosterone  injection. Pt want's to make Dr. Matilda aware and if this is something he can do. Pt is aware a message will be sent to MD on advisement.

## 2024-09-08 NOTE — Telephone Encounter (Signed)
 Pt/wife was made aware and voiced understanding If he wants to get T thru [facility] that is fine w/ me

## 2024-09-12 ENCOUNTER — Ambulatory Visit

## 2024-09-12 ENCOUNTER — Other Ambulatory Visit: Payer: Self-pay

## 2024-09-12 DIAGNOSIS — E1159 Type 2 diabetes mellitus with other circulatory complications: Secondary | ICD-10-CM

## 2024-09-12 MED ORDER — METFORMIN HCL 500 MG PO TABS: 1000.0000 mg | ORAL_TABLET | Freq: Two times a day (BID) | ORAL | 5 refills | Status: AC

## 2024-09-16 ENCOUNTER — Other Ambulatory Visit: Payer: Self-pay

## 2024-09-16 NOTE — Progress Notes (Signed)
° °  09/16/2024  Patient ID: Omar [name] Sieving, male   DOB: 09/03/1946, 78 y.o.   MRN: [id number]  Pharmacy Quality Measure Review  This patient is appearing on a report for being at risk of failing the adherence measure for diabetes medications this calendar year.   Medication: metformin  Last fill date: 12/12 for 30 day supply  Insurance report was not up to date. No action needed at this time.   Lang Sieve, PharmD, BCGP Clinical Pharmacist  [phone]

## 2024-09-19 NOTE — Progress Notes (Signed)
 Omar [name]                                          MRN: [id number]   09/19/2024   The [facility] Quality Team Specialist reviewed this patient medical record for the purposes of chart review for care gap closure. The following were reviewed: chart review for care gap closure-kidney health evaluation for diabetes:uACR.    [facility] Quality Team

## 2024-10-17 ENCOUNTER — Ambulatory Visit

## 2024-10-28 ENCOUNTER — Ambulatory Visit: Payer: PPO | Admitting: Urology

## 2024-11-06 ENCOUNTER — Other Ambulatory Visit (HOSPITAL_COMMUNITY): Payer: Self-pay

## 2024-12-08 ENCOUNTER — Ambulatory Visit

## 2025-03-06 ENCOUNTER — Ambulatory Visit: Admitting: Urology
